# Patient Record
Sex: Female | Born: 1940
Health system: Southern US, Community
[De-identification: ages and names within clinical notes are randomized; demographics above are authoritative.]

## PROBLEM LIST (undated history)

## (undated) DIAGNOSIS — N189 Chronic kidney disease, unspecified: Secondary | ICD-10-CM

## (undated) DIAGNOSIS — I499 Cardiac arrhythmia, unspecified: Secondary | ICD-10-CM

## (undated) DIAGNOSIS — J449 Chronic obstructive pulmonary disease, unspecified: Secondary | ICD-10-CM

## (undated) DIAGNOSIS — I4891 Unspecified atrial fibrillation: Secondary | ICD-10-CM

## (undated) DIAGNOSIS — C50919 Malignant neoplasm of unspecified site of unspecified female breast: Secondary | ICD-10-CM

## (undated) DIAGNOSIS — R011 Cardiac murmur, unspecified: Secondary | ICD-10-CM

## (undated) DIAGNOSIS — I38 Endocarditis, valve unspecified: Secondary | ICD-10-CM

## (undated) DIAGNOSIS — E785 Hyperlipidemia, unspecified: Secondary | ICD-10-CM

## (undated) DIAGNOSIS — I350 Nonrheumatic aortic (valve) stenosis: Secondary | ICD-10-CM

## (undated) DIAGNOSIS — I10 Essential (primary) hypertension: Secondary | ICD-10-CM

## (undated) DIAGNOSIS — Z952 Presence of prosthetic heart valve: Secondary | ICD-10-CM

## (undated) DIAGNOSIS — I429 Cardiomyopathy, unspecified: Secondary | ICD-10-CM

## (undated) DIAGNOSIS — M109 Gout, unspecified: Secondary | ICD-10-CM

## (undated) DIAGNOSIS — I272 Pulmonary hypertension, unspecified: Secondary | ICD-10-CM

## (undated) DIAGNOSIS — I509 Heart failure, unspecified: Secondary | ICD-10-CM

## (undated) HISTORY — PX: FRACTURE SURGERY: SHX138

## (undated) HISTORY — DX: Heart failure, unspecified: I50.9

## (undated) HISTORY — DX: Chronic obstructive pulmonary disease, unspecified: J44.9

## (undated) HISTORY — PX: LEG SURGERY: SHX1003

## (undated) HISTORY — DX: Hyperlipidemia, unspecified: E78.5

---

## 1990-09-08 DIAGNOSIS — C50919 Malignant neoplasm of unspecified site of unspecified female breast: Secondary | ICD-10-CM

## 1990-09-08 HISTORY — PX: BREAST BIOPSY: SHX20

## 1990-09-08 HISTORY — DX: Malignant neoplasm of unspecified site of unspecified female breast: C50.919

## 1990-09-08 HISTORY — PX: BREAST LUMPECTOMY: SHX2

## 1994-09-08 HISTORY — PX: ABDOMINAL HYSTERECTOMY: SHX81

## 1996-09-08 HISTORY — PX: BREAST BIOPSY: SHX20

## 1998-09-08 HISTORY — PX: BREAST EXCISIONAL BIOPSY: SUR124

## 2002-09-08 LAB — HM DEXA SCAN

## 2004-07-18 ENCOUNTER — Ambulatory Visit: Payer: Self-pay | Admitting: General Surgery

## 2005-06-17 ENCOUNTER — Ambulatory Visit: Payer: Self-pay

## 2005-07-16 ENCOUNTER — Ambulatory Visit: Payer: Self-pay | Admitting: General Surgery

## 2005-09-08 HISTORY — PX: BREAST BIOPSY: SHX20

## 2006-01-20 ENCOUNTER — Encounter: Payer: Self-pay | Admitting: Family Medicine

## 2006-03-02 ENCOUNTER — Ambulatory Visit: Payer: Self-pay | Admitting: General Surgery

## 2006-09-21 ENCOUNTER — Ambulatory Visit: Payer: Self-pay | Admitting: General Surgery

## 2006-12-22 ENCOUNTER — Ambulatory Visit: Payer: Self-pay

## 2007-11-01 ENCOUNTER — Ambulatory Visit: Payer: Self-pay | Admitting: General Surgery

## 2007-12-23 ENCOUNTER — Ambulatory Visit: Payer: Self-pay | Admitting: General Surgery

## 2007-12-23 LAB — HM COLONOSCOPY

## 2008-05-08 ENCOUNTER — Ambulatory Visit: Payer: Self-pay | Admitting: Family Medicine

## 2008-11-14 ENCOUNTER — Ambulatory Visit: Payer: Self-pay | Admitting: General Surgery

## 2008-11-21 ENCOUNTER — Ambulatory Visit: Payer: Self-pay | Admitting: General Surgery

## 2009-05-10 ENCOUNTER — Ambulatory Visit: Payer: Self-pay | Admitting: Family Medicine

## 2009-05-15 ENCOUNTER — Ambulatory Visit: Payer: Self-pay | Admitting: Family Medicine

## 2009-05-29 ENCOUNTER — Ambulatory Visit: Payer: Self-pay | Admitting: Internal Medicine

## 2009-05-29 ENCOUNTER — Inpatient Hospital Stay: Payer: Self-pay | Admitting: Internal Medicine

## 2009-11-15 ENCOUNTER — Ambulatory Visit: Payer: Self-pay | Admitting: General Surgery

## 2009-12-21 ENCOUNTER — Observation Stay: Payer: Self-pay | Admitting: Student

## 2010-01-25 ENCOUNTER — Ambulatory Visit: Payer: Self-pay | Admitting: Family Medicine

## 2010-11-28 ENCOUNTER — Ambulatory Visit: Payer: Self-pay | Admitting: Family Medicine

## 2010-12-30 ENCOUNTER — Inpatient Hospital Stay: Payer: Self-pay | Admitting: Internal Medicine

## 2011-03-17 ENCOUNTER — Encounter: Payer: Self-pay | Admitting: Family Medicine

## 2011-04-09 ENCOUNTER — Encounter: Payer: Self-pay | Admitting: Family Medicine

## 2011-05-10 ENCOUNTER — Encounter: Payer: Self-pay | Admitting: Family Medicine

## 2011-05-29 ENCOUNTER — Emergency Department: Payer: Self-pay | Admitting: Emergency Medicine

## 2011-09-24 DIAGNOSIS — R7309 Other abnormal glucose: Secondary | ICD-10-CM | POA: Diagnosis not present

## 2011-09-24 DIAGNOSIS — E785 Hyperlipidemia, unspecified: Secondary | ICD-10-CM | POA: Diagnosis not present

## 2011-09-24 DIAGNOSIS — E559 Vitamin D deficiency, unspecified: Secondary | ICD-10-CM | POA: Diagnosis not present

## 2011-09-24 DIAGNOSIS — Z23 Encounter for immunization: Secondary | ICD-10-CM | POA: Diagnosis not present

## 2011-09-24 DIAGNOSIS — I10 Essential (primary) hypertension: Secondary | ICD-10-CM | POA: Diagnosis not present

## 2011-10-07 DIAGNOSIS — Z8249 Family history of ischemic heart disease and other diseases of the circulatory system: Secondary | ICD-10-CM | POA: Diagnosis not present

## 2011-10-07 DIAGNOSIS — I059 Rheumatic mitral valve disease, unspecified: Secondary | ICD-10-CM | POA: Diagnosis not present

## 2011-10-07 DIAGNOSIS — I2789 Other specified pulmonary heart diseases: Secondary | ICD-10-CM | POA: Diagnosis not present

## 2011-10-07 DIAGNOSIS — I517 Cardiomegaly: Secondary | ICD-10-CM | POA: Diagnosis not present

## 2011-10-07 DIAGNOSIS — I359 Nonrheumatic aortic valve disorder, unspecified: Secondary | ICD-10-CM | POA: Diagnosis not present

## 2011-10-07 DIAGNOSIS — I4891 Unspecified atrial fibrillation: Secondary | ICD-10-CM | POA: Diagnosis not present

## 2011-10-07 DIAGNOSIS — Z7982 Long term (current) use of aspirin: Secondary | ICD-10-CM | POA: Diagnosis not present

## 2011-10-07 DIAGNOSIS — I10 Essential (primary) hypertension: Secondary | ICD-10-CM | POA: Diagnosis not present

## 2011-10-07 DIAGNOSIS — E785 Hyperlipidemia, unspecified: Secondary | ICD-10-CM | POA: Diagnosis not present

## 2011-10-07 DIAGNOSIS — I498 Other specified cardiac arrhythmias: Secondary | ICD-10-CM | POA: Diagnosis not present

## 2011-10-07 DIAGNOSIS — Z87891 Personal history of nicotine dependence: Secondary | ICD-10-CM | POA: Diagnosis not present

## 2011-10-07 DIAGNOSIS — I447 Left bundle-branch block, unspecified: Secondary | ICD-10-CM | POA: Diagnosis not present

## 2011-10-07 DIAGNOSIS — Z79899 Other long term (current) drug therapy: Secondary | ICD-10-CM | POA: Diagnosis not present

## 2011-11-12 DIAGNOSIS — F411 Generalized anxiety disorder: Secondary | ICD-10-CM | POA: Diagnosis not present

## 2011-11-12 DIAGNOSIS — M899 Disorder of bone, unspecified: Secondary | ICD-10-CM | POA: Diagnosis not present

## 2011-11-12 DIAGNOSIS — E785 Hyperlipidemia, unspecified: Secondary | ICD-10-CM | POA: Diagnosis not present

## 2011-11-24 DIAGNOSIS — Z111 Encounter for screening for respiratory tuberculosis: Secondary | ICD-10-CM | POA: Diagnosis not present

## 2011-11-24 DIAGNOSIS — H34239 Retinal artery branch occlusion, unspecified eye: Secondary | ICD-10-CM | POA: Diagnosis not present

## 2011-11-26 ENCOUNTER — Ambulatory Visit: Payer: Self-pay | Admitting: Ophthalmology

## 2011-11-26 DIAGNOSIS — I10 Essential (primary) hypertension: Secondary | ICD-10-CM | POA: Diagnosis not present

## 2011-11-26 DIAGNOSIS — I6529 Occlusion and stenosis of unspecified carotid artery: Secondary | ICD-10-CM | POA: Diagnosis not present

## 2011-11-26 DIAGNOSIS — H349 Unspecified retinal vascular occlusion: Secondary | ICD-10-CM | POA: Diagnosis not present

## 2011-11-26 DIAGNOSIS — I359 Nonrheumatic aortic valve disorder, unspecified: Secondary | ICD-10-CM | POA: Diagnosis not present

## 2011-12-09 DIAGNOSIS — H3582 Retinal ischemia: Secondary | ICD-10-CM | POA: Diagnosis not present

## 2012-01-01 ENCOUNTER — Ambulatory Visit: Payer: Self-pay | Admitting: Family Medicine

## 2012-01-01 DIAGNOSIS — Z853 Personal history of malignant neoplasm of breast: Secondary | ICD-10-CM | POA: Diagnosis not present

## 2012-01-06 DIAGNOSIS — H34239 Retinal artery branch occlusion, unspecified eye: Secondary | ICD-10-CM | POA: Diagnosis not present

## 2012-01-07 DIAGNOSIS — I10 Essential (primary) hypertension: Secondary | ICD-10-CM | POA: Diagnosis not present

## 2012-01-07 DIAGNOSIS — H34239 Retinal artery branch occlusion, unspecified eye: Secondary | ICD-10-CM | POA: Diagnosis not present

## 2012-01-07 DIAGNOSIS — E785 Hyperlipidemia, unspecified: Secondary | ICD-10-CM | POA: Diagnosis not present

## 2012-01-07 DIAGNOSIS — F411 Generalized anxiety disorder: Secondary | ICD-10-CM | POA: Diagnosis not present

## 2012-01-08 DIAGNOSIS — H349 Unspecified retinal vascular occlusion: Secondary | ICD-10-CM | POA: Diagnosis not present

## 2012-01-12 DIAGNOSIS — E785 Hyperlipidemia, unspecified: Secondary | ICD-10-CM | POA: Diagnosis not present

## 2012-01-14 DIAGNOSIS — H349 Unspecified retinal vascular occlusion: Secondary | ICD-10-CM | POA: Diagnosis not present

## 2012-01-15 ENCOUNTER — Ambulatory Visit: Payer: Self-pay | Admitting: Neurology

## 2012-01-15 DIAGNOSIS — H53139 Sudden visual loss, unspecified eye: Secondary | ICD-10-CM | POA: Diagnosis not present

## 2012-01-15 DIAGNOSIS — R51 Headache: Secondary | ICD-10-CM | POA: Diagnosis not present

## 2012-01-20 DIAGNOSIS — I4891 Unspecified atrial fibrillation: Secondary | ICD-10-CM | POA: Diagnosis not present

## 2012-01-30 DIAGNOSIS — H2510 Age-related nuclear cataract, unspecified eye: Secondary | ICD-10-CM | POA: Diagnosis not present

## 2012-03-02 DIAGNOSIS — H34239 Retinal artery branch occlusion, unspecified eye: Secondary | ICD-10-CM | POA: Diagnosis not present

## 2012-03-09 DIAGNOSIS — H539 Unspecified visual disturbance: Secondary | ICD-10-CM | POA: Diagnosis not present

## 2012-03-09 DIAGNOSIS — I69998 Other sequelae following unspecified cerebrovascular disease: Secondary | ICD-10-CM | POA: Diagnosis not present

## 2012-04-28 DIAGNOSIS — E785 Hyperlipidemia, unspecified: Secondary | ICD-10-CM | POA: Diagnosis not present

## 2012-04-28 DIAGNOSIS — I10 Essential (primary) hypertension: Secondary | ICD-10-CM | POA: Diagnosis not present

## 2012-04-28 DIAGNOSIS — F411 Generalized anxiety disorder: Secondary | ICD-10-CM | POA: Diagnosis not present

## 2012-04-28 DIAGNOSIS — I359 Nonrheumatic aortic valve disorder, unspecified: Secondary | ICD-10-CM | POA: Diagnosis not present

## 2012-06-24 DIAGNOSIS — Z111 Encounter for screening for respiratory tuberculosis: Secondary | ICD-10-CM | POA: Diagnosis not present

## 2012-06-24 DIAGNOSIS — I509 Heart failure, unspecified: Secondary | ICD-10-CM | POA: Diagnosis not present

## 2012-06-24 DIAGNOSIS — I4891 Unspecified atrial fibrillation: Secondary | ICD-10-CM | POA: Diagnosis not present

## 2012-06-24 DIAGNOSIS — I10 Essential (primary) hypertension: Secondary | ICD-10-CM | POA: Diagnosis not present

## 2012-06-29 DIAGNOSIS — E785 Hyperlipidemia, unspecified: Secondary | ICD-10-CM | POA: Diagnosis not present

## 2012-06-29 DIAGNOSIS — I359 Nonrheumatic aortic valve disorder, unspecified: Secondary | ICD-10-CM | POA: Diagnosis not present

## 2012-06-29 DIAGNOSIS — Z0289 Encounter for other administrative examinations: Secondary | ICD-10-CM | POA: Diagnosis not present

## 2012-06-29 DIAGNOSIS — I10 Essential (primary) hypertension: Secondary | ICD-10-CM | POA: Diagnosis not present

## 2012-06-29 DIAGNOSIS — I509 Heart failure, unspecified: Secondary | ICD-10-CM | POA: Diagnosis not present

## 2012-06-29 DIAGNOSIS — I4891 Unspecified atrial fibrillation: Secondary | ICD-10-CM | POA: Diagnosis not present

## 2012-07-13 DIAGNOSIS — I359 Nonrheumatic aortic valve disorder, unspecified: Secondary | ICD-10-CM | POA: Diagnosis not present

## 2012-07-13 DIAGNOSIS — J449 Chronic obstructive pulmonary disease, unspecified: Secondary | ICD-10-CM | POA: Diagnosis not present

## 2012-07-13 DIAGNOSIS — R0602 Shortness of breath: Secondary | ICD-10-CM | POA: Diagnosis not present

## 2012-07-13 DIAGNOSIS — R7989 Other specified abnormal findings of blood chemistry: Secondary | ICD-10-CM | POA: Diagnosis not present

## 2012-07-13 DIAGNOSIS — R5381 Other malaise: Secondary | ICD-10-CM | POA: Diagnosis not present

## 2012-07-13 DIAGNOSIS — I2789 Other specified pulmonary heart diseases: Secondary | ICD-10-CM | POA: Diagnosis not present

## 2012-07-13 DIAGNOSIS — N23 Unspecified renal colic: Secondary | ICD-10-CM | POA: Diagnosis not present

## 2012-07-13 DIAGNOSIS — R5383 Other fatigue: Secondary | ICD-10-CM | POA: Diagnosis not present

## 2012-07-13 DIAGNOSIS — R7402 Elevation of levels of lactic acid dehydrogenase (LDH): Secondary | ICD-10-CM | POA: Diagnosis not present

## 2012-07-13 DIAGNOSIS — I4891 Unspecified atrial fibrillation: Secondary | ICD-10-CM | POA: Diagnosis not present

## 2012-07-13 DIAGNOSIS — I10 Essential (primary) hypertension: Secondary | ICD-10-CM | POA: Diagnosis not present

## 2012-07-13 DIAGNOSIS — I509 Heart failure, unspecified: Secondary | ICD-10-CM | POA: Diagnosis not present

## 2012-07-20 DIAGNOSIS — I10 Essential (primary) hypertension: Secondary | ICD-10-CM | POA: Diagnosis not present

## 2012-07-20 DIAGNOSIS — I4891 Unspecified atrial fibrillation: Secondary | ICD-10-CM | POA: Diagnosis not present

## 2012-08-04 DIAGNOSIS — G4700 Insomnia, unspecified: Secondary | ICD-10-CM | POA: Diagnosis not present

## 2012-08-04 DIAGNOSIS — I10 Essential (primary) hypertension: Secondary | ICD-10-CM | POA: Diagnosis not present

## 2012-08-04 DIAGNOSIS — I4891 Unspecified atrial fibrillation: Secondary | ICD-10-CM | POA: Diagnosis not present

## 2012-08-04 DIAGNOSIS — E785 Hyperlipidemia, unspecified: Secondary | ICD-10-CM | POA: Diagnosis not present

## 2012-08-17 ENCOUNTER — Ambulatory Visit: Payer: Self-pay | Admitting: Internal Medicine

## 2012-08-17 DIAGNOSIS — I517 Cardiomegaly: Secondary | ICD-10-CM | POA: Diagnosis not present

## 2012-08-17 DIAGNOSIS — I509 Heart failure, unspecified: Secondary | ICD-10-CM | POA: Diagnosis not present

## 2012-08-17 DIAGNOSIS — R0602 Shortness of breath: Secondary | ICD-10-CM | POA: Diagnosis not present

## 2012-08-17 DIAGNOSIS — R918 Other nonspecific abnormal finding of lung field: Secondary | ICD-10-CM | POA: Diagnosis not present

## 2012-08-17 DIAGNOSIS — I4891 Unspecified atrial fibrillation: Secondary | ICD-10-CM | POA: Diagnosis not present

## 2012-08-17 LAB — COMPREHENSIVE METABOLIC PANEL
Albumin: 3.9 g/dL (ref 3.4–5.0)
Alkaline Phosphatase: 61 U/L (ref 50–136)
BUN: 26 mg/dL — ABNORMAL HIGH (ref 7–18)
Bilirubin,Total: 0.7 mg/dL (ref 0.2–1.0)
Calcium, Total: 9 mg/dL (ref 8.5–10.1)
Co2: 27 mmol/L (ref 21–32)
Glucose: 96 mg/dL (ref 65–99)
Osmolality: 291 (ref 275–301)
Potassium: 3.3 mmol/L — ABNORMAL LOW (ref 3.5–5.1)
SGOT(AST): 31 U/L (ref 15–37)
Sodium: 144 mmol/L (ref 136–145)

## 2012-08-17 LAB — CBC WITH DIFFERENTIAL/PLATELET
Basophil %: 1.3 %
Eosinophil %: 2.5 %
HGB: 13.3 g/dL (ref 12.0–16.0)
Lymphocyte #: 2 10*3/uL (ref 1.0–3.6)
Lymphocyte %: 34.8 %
MCH: 28.4 pg (ref 26.0–34.0)
Monocyte #: 0.7 x10 3/mm (ref 0.2–0.9)
Monocyte %: 11.8 %
Neutrophil %: 49.6 %
Platelet: 262 10*3/uL (ref 150–440)
RBC: 4.67 10*6/uL (ref 3.80–5.20)
WBC: 5.7 10*3/uL (ref 3.6–11.0)

## 2012-08-17 LAB — TSH: Thyroid Stimulating Horm: 1.55 u[IU]/mL

## 2012-08-25 ENCOUNTER — Ambulatory Visit: Payer: Self-pay | Admitting: Internal Medicine

## 2012-08-25 DIAGNOSIS — R0602 Shortness of breath: Secondary | ICD-10-CM | POA: Diagnosis not present

## 2012-08-25 DIAGNOSIS — I359 Nonrheumatic aortic valve disorder, unspecified: Secondary | ICD-10-CM | POA: Diagnosis not present

## 2012-08-25 DIAGNOSIS — I700 Atherosclerosis of aorta: Secondary | ICD-10-CM | POA: Diagnosis not present

## 2012-08-25 DIAGNOSIS — Z853 Personal history of malignant neoplasm of breast: Secondary | ICD-10-CM | POA: Diagnosis not present

## 2012-08-25 DIAGNOSIS — Z79899 Other long term (current) drug therapy: Secondary | ICD-10-CM | POA: Diagnosis not present

## 2012-08-25 DIAGNOSIS — Z885 Allergy status to narcotic agent status: Secondary | ICD-10-CM | POA: Diagnosis not present

## 2012-08-25 DIAGNOSIS — J449 Chronic obstructive pulmonary disease, unspecified: Secondary | ICD-10-CM | POA: Diagnosis not present

## 2012-08-25 DIAGNOSIS — Z7982 Long term (current) use of aspirin: Secondary | ICD-10-CM | POA: Diagnosis not present

## 2012-08-25 DIAGNOSIS — Z87891 Personal history of nicotine dependence: Secondary | ICD-10-CM | POA: Diagnosis not present

## 2012-08-25 DIAGNOSIS — I519 Heart disease, unspecified: Secondary | ICD-10-CM | POA: Diagnosis not present

## 2012-08-25 DIAGNOSIS — I10 Essential (primary) hypertension: Secondary | ICD-10-CM | POA: Diagnosis not present

## 2012-08-25 DIAGNOSIS — I4891 Unspecified atrial fibrillation: Secondary | ICD-10-CM | POA: Diagnosis not present

## 2012-08-30 DIAGNOSIS — I4891 Unspecified atrial fibrillation: Secondary | ICD-10-CM | POA: Diagnosis not present

## 2012-08-30 DIAGNOSIS — I509 Heart failure, unspecified: Secondary | ICD-10-CM | POA: Diagnosis not present

## 2012-08-30 DIAGNOSIS — R0609 Other forms of dyspnea: Secondary | ICD-10-CM | POA: Diagnosis not present

## 2012-08-30 DIAGNOSIS — R0989 Other specified symptoms and signs involving the circulatory and respiratory systems: Secondary | ICD-10-CM | POA: Diagnosis not present

## 2012-09-06 ENCOUNTER — Ambulatory Visit: Payer: Self-pay | Admitting: Internal Medicine

## 2012-09-06 DIAGNOSIS — I150 Renovascular hypertension: Secondary | ICD-10-CM | POA: Diagnosis not present

## 2012-09-06 DIAGNOSIS — I270 Primary pulmonary hypertension: Secondary | ICD-10-CM | POA: Diagnosis not present

## 2012-09-06 DIAGNOSIS — I4891 Unspecified atrial fibrillation: Secondary | ICD-10-CM | POA: Diagnosis not present

## 2012-09-06 DIAGNOSIS — I359 Nonrheumatic aortic valve disorder, unspecified: Secondary | ICD-10-CM | POA: Diagnosis not present

## 2012-09-06 DIAGNOSIS — E669 Obesity, unspecified: Secondary | ICD-10-CM | POA: Diagnosis not present

## 2012-09-06 DIAGNOSIS — R0602 Shortness of breath: Secondary | ICD-10-CM | POA: Diagnosis not present

## 2012-09-06 DIAGNOSIS — E781 Pure hyperglyceridemia: Secondary | ICD-10-CM | POA: Diagnosis not present

## 2012-09-06 DIAGNOSIS — Q231 Congenital insufficiency of aortic valve: Secondary | ICD-10-CM | POA: Diagnosis not present

## 2012-09-06 DIAGNOSIS — I701 Atherosclerosis of renal artery: Secondary | ICD-10-CM | POA: Diagnosis not present

## 2012-09-06 DIAGNOSIS — Z87891 Personal history of nicotine dependence: Secondary | ICD-10-CM | POA: Diagnosis not present

## 2012-09-06 DIAGNOSIS — I501 Left ventricular failure: Secondary | ICD-10-CM | POA: Diagnosis not present

## 2012-09-08 HISTORY — PX: CARDIAC VALVE REPLACEMENT: SHX585

## 2012-09-15 DIAGNOSIS — I4891 Unspecified atrial fibrillation: Secondary | ICD-10-CM | POA: Diagnosis not present

## 2012-09-15 DIAGNOSIS — I359 Nonrheumatic aortic valve disorder, unspecified: Secondary | ICD-10-CM | POA: Diagnosis not present

## 2012-10-01 DIAGNOSIS — I447 Left bundle-branch block, unspecified: Secondary | ICD-10-CM | POA: Diagnosis not present

## 2012-10-01 DIAGNOSIS — Q231 Congenital insufficiency of aortic valve: Secondary | ICD-10-CM | POA: Diagnosis not present

## 2012-10-01 DIAGNOSIS — Z952 Presence of prosthetic heart valve: Secondary | ICD-10-CM | POA: Insufficient documentation

## 2012-10-01 DIAGNOSIS — I509 Heart failure, unspecified: Secondary | ICD-10-CM | POA: Diagnosis not present

## 2012-10-01 DIAGNOSIS — I359 Nonrheumatic aortic valve disorder, unspecified: Secondary | ICD-10-CM | POA: Diagnosis not present

## 2012-10-01 DIAGNOSIS — Z9889 Other specified postprocedural states: Secondary | ICD-10-CM | POA: Insufficient documentation

## 2012-10-01 DIAGNOSIS — I4891 Unspecified atrial fibrillation: Secondary | ICD-10-CM | POA: Diagnosis not present

## 2012-10-01 DIAGNOSIS — I10 Essential (primary) hypertension: Secondary | ICD-10-CM | POA: Diagnosis not present

## 2012-10-04 DIAGNOSIS — I359 Nonrheumatic aortic valve disorder, unspecified: Secondary | ICD-10-CM | POA: Diagnosis not present

## 2012-10-08 DIAGNOSIS — I359 Nonrheumatic aortic valve disorder, unspecified: Secondary | ICD-10-CM | POA: Diagnosis not present

## 2012-10-11 DIAGNOSIS — J449 Chronic obstructive pulmonary disease, unspecified: Secondary | ICD-10-CM | POA: Diagnosis not present

## 2012-10-11 DIAGNOSIS — D62 Acute posthemorrhagic anemia: Secondary | ICD-10-CM | POA: Diagnosis not present

## 2012-10-11 DIAGNOSIS — Z954 Presence of other heart-valve replacement: Secondary | ICD-10-CM | POA: Diagnosis not present

## 2012-10-11 DIAGNOSIS — I10 Essential (primary) hypertension: Secondary | ICD-10-CM | POA: Diagnosis present

## 2012-10-11 DIAGNOSIS — I2609 Other pulmonary embolism with acute cor pulmonale: Secondary | ICD-10-CM | POA: Diagnosis not present

## 2012-10-11 DIAGNOSIS — J4489 Other specified chronic obstructive pulmonary disease: Secondary | ICD-10-CM | POA: Diagnosis not present

## 2012-10-11 DIAGNOSIS — R9389 Abnormal findings on diagnostic imaging of other specified body structures: Secondary | ICD-10-CM | POA: Diagnosis not present

## 2012-10-11 DIAGNOSIS — R944 Abnormal results of kidney function studies: Secondary | ICD-10-CM | POA: Diagnosis not present

## 2012-10-11 DIAGNOSIS — I359 Nonrheumatic aortic valve disorder, unspecified: Secondary | ICD-10-CM | POA: Diagnosis not present

## 2012-10-11 DIAGNOSIS — I509 Heart failure, unspecified: Secondary | ICD-10-CM | POA: Diagnosis not present

## 2012-10-11 DIAGNOSIS — I059 Rheumatic mitral valve disease, unspecified: Secondary | ICD-10-CM | POA: Diagnosis not present

## 2012-10-11 DIAGNOSIS — Z452 Encounter for adjustment and management of vascular access device: Secondary | ICD-10-CM | POA: Diagnosis not present

## 2012-10-11 DIAGNOSIS — M109 Gout, unspecified: Secondary | ICD-10-CM | POA: Diagnosis not present

## 2012-10-11 DIAGNOSIS — I517 Cardiomegaly: Secondary | ICD-10-CM | POA: Diagnosis not present

## 2012-10-11 DIAGNOSIS — Z4682 Encounter for fitting and adjustment of non-vascular catheter: Secondary | ICD-10-CM | POA: Diagnosis not present

## 2012-10-11 DIAGNOSIS — I5022 Chronic systolic (congestive) heart failure: Secondary | ICD-10-CM | POA: Diagnosis not present

## 2012-10-11 DIAGNOSIS — J9819 Other pulmonary collapse: Secondary | ICD-10-CM | POA: Diagnosis not present

## 2012-10-11 DIAGNOSIS — I2789 Other specified pulmonary heart diseases: Secondary | ICD-10-CM | POA: Diagnosis present

## 2012-10-11 DIAGNOSIS — J811 Chronic pulmonary edema: Secondary | ICD-10-CM | POA: Diagnosis not present

## 2012-10-11 DIAGNOSIS — Z7901 Long term (current) use of anticoagulants: Secondary | ICD-10-CM | POA: Diagnosis not present

## 2012-10-11 DIAGNOSIS — I4891 Unspecified atrial fibrillation: Secondary | ICD-10-CM | POA: Diagnosis not present

## 2012-10-11 DIAGNOSIS — I079 Rheumatic tricuspid valve disease, unspecified: Secondary | ICD-10-CM | POA: Diagnosis present

## 2012-10-11 DIAGNOSIS — N179 Acute kidney failure, unspecified: Secondary | ICD-10-CM | POA: Diagnosis not present

## 2012-10-11 DIAGNOSIS — R918 Other nonspecific abnormal finding of lung field: Secondary | ICD-10-CM | POA: Diagnosis not present

## 2012-10-11 DIAGNOSIS — J9600 Acute respiratory failure, unspecified whether with hypoxia or hypercapnia: Secondary | ICD-10-CM | POA: Diagnosis not present

## 2012-11-01 DIAGNOSIS — J069 Acute upper respiratory infection, unspecified: Secondary | ICD-10-CM | POA: Diagnosis not present

## 2012-11-11 DIAGNOSIS — M109 Gout, unspecified: Secondary | ICD-10-CM | POA: Diagnosis not present

## 2012-11-15 DIAGNOSIS — I359 Nonrheumatic aortic valve disorder, unspecified: Secondary | ICD-10-CM | POA: Diagnosis not present

## 2012-11-15 DIAGNOSIS — R918 Other nonspecific abnormal finding of lung field: Secondary | ICD-10-CM | POA: Diagnosis not present

## 2012-11-15 DIAGNOSIS — I517 Cardiomegaly: Secondary | ICD-10-CM | POA: Diagnosis not present

## 2012-12-06 ENCOUNTER — Encounter: Payer: Self-pay | Admitting: Cardiothoracic Surgery

## 2012-12-06 DIAGNOSIS — Z954 Presence of other heart-valve replacement: Secondary | ICD-10-CM | POA: Diagnosis not present

## 2012-12-06 DIAGNOSIS — Z5189 Encounter for other specified aftercare: Secondary | ICD-10-CM | POA: Diagnosis not present

## 2012-12-07 ENCOUNTER — Encounter: Payer: Self-pay | Admitting: Cardiothoracic Surgery

## 2012-12-07 DIAGNOSIS — Z5189 Encounter for other specified aftercare: Secondary | ICD-10-CM | POA: Diagnosis not present

## 2012-12-07 DIAGNOSIS — Z954 Presence of other heart-valve replacement: Secondary | ICD-10-CM | POA: Diagnosis not present

## 2012-12-17 DIAGNOSIS — I4891 Unspecified atrial fibrillation: Secondary | ICD-10-CM | POA: Diagnosis not present

## 2012-12-17 DIAGNOSIS — Z9889 Other specified postprocedural states: Secondary | ICD-10-CM | POA: Diagnosis not present

## 2012-12-17 DIAGNOSIS — Q231 Congenital insufficiency of aortic valve: Secondary | ICD-10-CM | POA: Diagnosis not present

## 2012-12-24 DIAGNOSIS — I4891 Unspecified atrial fibrillation: Secondary | ICD-10-CM | POA: Diagnosis not present

## 2012-12-29 DIAGNOSIS — I447 Left bundle-branch block, unspecified: Secondary | ICD-10-CM | POA: Diagnosis not present

## 2012-12-29 DIAGNOSIS — R9431 Abnormal electrocardiogram [ECG] [EKG]: Secondary | ICD-10-CM | POA: Diagnosis not present

## 2012-12-29 DIAGNOSIS — I498 Other specified cardiac arrhythmias: Secondary | ICD-10-CM | POA: Diagnosis not present

## 2012-12-29 DIAGNOSIS — I4891 Unspecified atrial fibrillation: Secondary | ICD-10-CM | POA: Diagnosis not present

## 2012-12-29 DIAGNOSIS — Z954 Presence of other heart-valve replacement: Secondary | ICD-10-CM | POA: Diagnosis not present

## 2013-01-04 DIAGNOSIS — Z954 Presence of other heart-valve replacement: Secondary | ICD-10-CM | POA: Diagnosis not present

## 2013-01-04 DIAGNOSIS — I4891 Unspecified atrial fibrillation: Secondary | ICD-10-CM | POA: Diagnosis not present

## 2013-01-04 DIAGNOSIS — Z9889 Other specified postprocedural states: Secondary | ICD-10-CM | POA: Diagnosis not present

## 2013-01-06 ENCOUNTER — Encounter: Payer: Self-pay | Admitting: Cardiothoracic Surgery

## 2013-01-06 DIAGNOSIS — Z5189 Encounter for other specified aftercare: Secondary | ICD-10-CM | POA: Diagnosis not present

## 2013-01-06 DIAGNOSIS — Z954 Presence of other heart-valve replacement: Secondary | ICD-10-CM | POA: Diagnosis not present

## 2013-01-11 DIAGNOSIS — Z9889 Other specified postprocedural states: Secondary | ICD-10-CM | POA: Diagnosis not present

## 2013-01-11 DIAGNOSIS — Z954 Presence of other heart-valve replacement: Secondary | ICD-10-CM | POA: Diagnosis not present

## 2013-01-18 DIAGNOSIS — Z954 Presence of other heart-valve replacement: Secondary | ICD-10-CM | POA: Diagnosis not present

## 2013-01-18 DIAGNOSIS — Z9889 Other specified postprocedural states: Secondary | ICD-10-CM | POA: Diagnosis not present

## 2013-02-01 DIAGNOSIS — Z954 Presence of other heart-valve replacement: Secondary | ICD-10-CM | POA: Diagnosis not present

## 2013-02-01 DIAGNOSIS — Z9889 Other specified postprocedural states: Secondary | ICD-10-CM | POA: Diagnosis not present

## 2013-02-06 ENCOUNTER — Encounter: Payer: Self-pay | Admitting: Cardiothoracic Surgery

## 2013-02-22 DIAGNOSIS — Z954 Presence of other heart-valve replacement: Secondary | ICD-10-CM | POA: Diagnosis not present

## 2013-02-22 DIAGNOSIS — I359 Nonrheumatic aortic valve disorder, unspecified: Secondary | ICD-10-CM | POA: Diagnosis not present

## 2013-02-22 DIAGNOSIS — Z9889 Other specified postprocedural states: Secondary | ICD-10-CM | POA: Diagnosis not present

## 2013-02-22 DIAGNOSIS — I5022 Chronic systolic (congestive) heart failure: Secondary | ICD-10-CM | POA: Diagnosis not present

## 2013-03-01 ENCOUNTER — Ambulatory Visit: Payer: Self-pay | Admitting: Family Medicine

## 2013-03-01 DIAGNOSIS — R928 Other abnormal and inconclusive findings on diagnostic imaging of breast: Secondary | ICD-10-CM | POA: Diagnosis not present

## 2013-03-01 DIAGNOSIS — R920 Mammographic microcalcification found on diagnostic imaging of breast: Secondary | ICD-10-CM | POA: Diagnosis not present

## 2013-03-01 DIAGNOSIS — Z853 Personal history of malignant neoplasm of breast: Secondary | ICD-10-CM | POA: Diagnosis not present

## 2013-03-08 ENCOUNTER — Encounter: Payer: Self-pay | Admitting: Cardiothoracic Surgery

## 2013-03-08 DIAGNOSIS — Z9889 Other specified postprocedural states: Secondary | ICD-10-CM | POA: Diagnosis not present

## 2013-03-08 DIAGNOSIS — Z954 Presence of other heart-valve replacement: Secondary | ICD-10-CM | POA: Diagnosis not present

## 2013-03-18 DIAGNOSIS — H35359 Cystoid macular degeneration, unspecified eye: Secondary | ICD-10-CM | POA: Diagnosis not present

## 2013-04-05 DIAGNOSIS — Z954 Presence of other heart-valve replacement: Secondary | ICD-10-CM | POA: Diagnosis not present

## 2013-04-05 DIAGNOSIS — Z9889 Other specified postprocedural states: Secondary | ICD-10-CM | POA: Diagnosis not present

## 2013-04-08 ENCOUNTER — Encounter: Payer: Self-pay | Admitting: Cardiothoracic Surgery

## 2013-05-10 DIAGNOSIS — Z7901 Long term (current) use of anticoagulants: Secondary | ICD-10-CM | POA: Diagnosis not present

## 2013-05-10 DIAGNOSIS — Z9889 Other specified postprocedural states: Secondary | ICD-10-CM | POA: Diagnosis not present

## 2013-05-10 DIAGNOSIS — Z954 Presence of other heart-valve replacement: Secondary | ICD-10-CM | POA: Diagnosis not present

## 2013-05-31 DIAGNOSIS — Z9889 Other specified postprocedural states: Secondary | ICD-10-CM | POA: Diagnosis not present

## 2013-05-31 DIAGNOSIS — J029 Acute pharyngitis, unspecified: Secondary | ICD-10-CM | POA: Diagnosis not present

## 2013-05-31 DIAGNOSIS — Z954 Presence of other heart-valve replacement: Secondary | ICD-10-CM | POA: Diagnosis not present

## 2013-05-31 DIAGNOSIS — I4891 Unspecified atrial fibrillation: Secondary | ICD-10-CM | POA: Diagnosis not present

## 2013-05-31 DIAGNOSIS — Z23 Encounter for immunization: Secondary | ICD-10-CM | POA: Diagnosis not present

## 2013-06-15 DIAGNOSIS — I4891 Unspecified atrial fibrillation: Secondary | ICD-10-CM | POA: Diagnosis not present

## 2013-06-15 DIAGNOSIS — Z7901 Long term (current) use of anticoagulants: Secondary | ICD-10-CM | POA: Diagnosis not present

## 2013-06-15 DIAGNOSIS — Z9889 Other specified postprocedural states: Secondary | ICD-10-CM | POA: Diagnosis not present

## 2013-06-15 DIAGNOSIS — Z954 Presence of other heart-valve replacement: Secondary | ICD-10-CM | POA: Diagnosis not present

## 2013-06-15 DIAGNOSIS — Q231 Congenital insufficiency of aortic valve: Secondary | ICD-10-CM | POA: Diagnosis not present

## 2013-06-29 DIAGNOSIS — Z954 Presence of other heart-valve replacement: Secondary | ICD-10-CM | POA: Diagnosis not present

## 2013-08-09 DIAGNOSIS — I4891 Unspecified atrial fibrillation: Secondary | ICD-10-CM | POA: Diagnosis not present

## 2013-08-10 DIAGNOSIS — I509 Heart failure, unspecified: Secondary | ICD-10-CM | POA: Diagnosis not present

## 2013-08-10 DIAGNOSIS — I10 Essential (primary) hypertension: Secondary | ICD-10-CM | POA: Diagnosis not present

## 2013-08-10 DIAGNOSIS — Z9181 History of falling: Secondary | ICD-10-CM | POA: Diagnosis not present

## 2013-08-10 DIAGNOSIS — E785 Hyperlipidemia, unspecified: Secondary | ICD-10-CM | POA: Diagnosis not present

## 2013-08-15 DIAGNOSIS — R7309 Other abnormal glucose: Secondary | ICD-10-CM | POA: Diagnosis not present

## 2013-08-15 DIAGNOSIS — R635 Abnormal weight gain: Secondary | ICD-10-CM | POA: Diagnosis not present

## 2013-08-15 DIAGNOSIS — I10 Essential (primary) hypertension: Secondary | ICD-10-CM | POA: Diagnosis not present

## 2013-08-15 DIAGNOSIS — E785 Hyperlipidemia, unspecified: Secondary | ICD-10-CM | POA: Diagnosis not present

## 2013-09-06 DIAGNOSIS — I4891 Unspecified atrial fibrillation: Secondary | ICD-10-CM | POA: Diagnosis not present

## 2013-09-06 DIAGNOSIS — I359 Nonrheumatic aortic valve disorder, unspecified: Secondary | ICD-10-CM | POA: Diagnosis not present

## 2013-09-09 DIAGNOSIS — I4891 Unspecified atrial fibrillation: Secondary | ICD-10-CM | POA: Diagnosis not present

## 2013-09-22 DIAGNOSIS — H25019 Cortical age-related cataract, unspecified eye: Secondary | ICD-10-CM | POA: Diagnosis not present

## 2013-10-05 DIAGNOSIS — I4891 Unspecified atrial fibrillation: Secondary | ICD-10-CM | POA: Diagnosis not present

## 2013-10-18 DIAGNOSIS — J329 Chronic sinusitis, unspecified: Secondary | ICD-10-CM | POA: Diagnosis not present

## 2013-10-19 DIAGNOSIS — Z954 Presence of other heart-valve replacement: Secondary | ICD-10-CM | POA: Diagnosis not present

## 2013-11-02 DIAGNOSIS — Z954 Presence of other heart-valve replacement: Secondary | ICD-10-CM | POA: Diagnosis not present

## 2013-11-16 DIAGNOSIS — Z954 Presence of other heart-valve replacement: Secondary | ICD-10-CM | POA: Diagnosis not present

## 2013-12-28 DIAGNOSIS — R252 Cramp and spasm: Secondary | ICD-10-CM | POA: Diagnosis not present

## 2013-12-28 DIAGNOSIS — R7309 Other abnormal glucose: Secondary | ICD-10-CM | POA: Diagnosis not present

## 2014-01-23 DIAGNOSIS — R7309 Other abnormal glucose: Secondary | ICD-10-CM | POA: Diagnosis not present

## 2014-01-23 DIAGNOSIS — M109 Gout, unspecified: Secondary | ICD-10-CM | POA: Diagnosis not present

## 2014-01-23 DIAGNOSIS — I10 Essential (primary) hypertension: Secondary | ICD-10-CM | POA: Diagnosis not present

## 2014-01-23 DIAGNOSIS — J441 Chronic obstructive pulmonary disease with (acute) exacerbation: Secondary | ICD-10-CM | POA: Diagnosis not present

## 2014-01-25 DIAGNOSIS — M109 Gout, unspecified: Secondary | ICD-10-CM | POA: Diagnosis not present

## 2014-01-25 DIAGNOSIS — R7309 Other abnormal glucose: Secondary | ICD-10-CM | POA: Diagnosis not present

## 2014-01-25 DIAGNOSIS — E785 Hyperlipidemia, unspecified: Secondary | ICD-10-CM | POA: Diagnosis not present

## 2014-01-25 DIAGNOSIS — I10 Essential (primary) hypertension: Secondary | ICD-10-CM | POA: Diagnosis not present

## 2014-01-25 LAB — LIPID PANEL
Cholesterol: 176 mg/dL (ref 0–200)
HDL: 46 mg/dL (ref 35–70)
LDL CALC: 95 mg/dL
Triglycerides: 175 mg/dL — AB (ref 40–160)

## 2014-01-25 LAB — HEMOGLOBIN A1C: Hgb A1c MFr Bld: 5.9 % (ref 4.0–6.0)

## 2014-01-27 DIAGNOSIS — I4891 Unspecified atrial fibrillation: Secondary | ICD-10-CM | POA: Diagnosis not present

## 2014-02-27 DIAGNOSIS — I4891 Unspecified atrial fibrillation: Secondary | ICD-10-CM | POA: Diagnosis not present

## 2014-03-15 DIAGNOSIS — I4891 Unspecified atrial fibrillation: Secondary | ICD-10-CM | POA: Diagnosis not present

## 2014-03-17 DIAGNOSIS — I10 Essential (primary) hypertension: Secondary | ICD-10-CM | POA: Diagnosis not present

## 2014-03-17 DIAGNOSIS — Z954 Presence of other heart-valve replacement: Secondary | ICD-10-CM | POA: Diagnosis not present

## 2014-03-17 DIAGNOSIS — R0609 Other forms of dyspnea: Secondary | ICD-10-CM | POA: Diagnosis not present

## 2014-03-17 DIAGNOSIS — R0989 Other specified symptoms and signs involving the circulatory and respiratory systems: Secondary | ICD-10-CM | POA: Diagnosis not present

## 2014-03-17 DIAGNOSIS — I4891 Unspecified atrial fibrillation: Secondary | ICD-10-CM | POA: Diagnosis not present

## 2014-03-28 DIAGNOSIS — I4891 Unspecified atrial fibrillation: Secondary | ICD-10-CM | POA: Diagnosis not present

## 2014-04-13 DIAGNOSIS — I4891 Unspecified atrial fibrillation: Secondary | ICD-10-CM | POA: Diagnosis not present

## 2014-05-08 ENCOUNTER — Ambulatory Visit: Payer: Self-pay | Admitting: Family Medicine

## 2014-05-08 DIAGNOSIS — Z853 Personal history of malignant neoplasm of breast: Secondary | ICD-10-CM | POA: Diagnosis not present

## 2014-05-08 DIAGNOSIS — Z1231 Encounter for screening mammogram for malignant neoplasm of breast: Secondary | ICD-10-CM | POA: Diagnosis not present

## 2014-05-08 LAB — HM MAMMOGRAPHY: HM MAMMO: NORMAL

## 2014-05-09 DIAGNOSIS — I4891 Unspecified atrial fibrillation: Secondary | ICD-10-CM | POA: Diagnosis not present

## 2014-05-30 DIAGNOSIS — G4700 Insomnia, unspecified: Secondary | ICD-10-CM | POA: Diagnosis not present

## 2014-05-30 DIAGNOSIS — E785 Hyperlipidemia, unspecified: Secondary | ICD-10-CM | POA: Diagnosis not present

## 2014-05-30 DIAGNOSIS — M109 Gout, unspecified: Secondary | ICD-10-CM | POA: Diagnosis not present

## 2014-05-30 DIAGNOSIS — Z23 Encounter for immunization: Secondary | ICD-10-CM | POA: Diagnosis not present

## 2014-05-30 DIAGNOSIS — I10 Essential (primary) hypertension: Secondary | ICD-10-CM | POA: Diagnosis not present

## 2014-05-30 DIAGNOSIS — Z0000 Encounter for general adult medical examination without abnormal findings: Secondary | ICD-10-CM | POA: Diagnosis not present

## 2014-05-30 DIAGNOSIS — R490 Dysphonia: Secondary | ICD-10-CM | POA: Diagnosis not present

## 2014-05-30 DIAGNOSIS — Z9181 History of falling: Secondary | ICD-10-CM | POA: Diagnosis not present

## 2014-05-30 DIAGNOSIS — Z1331 Encounter for screening for depression: Secondary | ICD-10-CM | POA: Diagnosis not present

## 2014-06-14 DIAGNOSIS — I4891 Unspecified atrial fibrillation: Secondary | ICD-10-CM | POA: Diagnosis not present

## 2014-07-28 DIAGNOSIS — I4891 Unspecified atrial fibrillation: Secondary | ICD-10-CM | POA: Diagnosis not present

## 2014-08-15 DIAGNOSIS — I4891 Unspecified atrial fibrillation: Secondary | ICD-10-CM | POA: Diagnosis not present

## 2014-09-06 DIAGNOSIS — J069 Acute upper respiratory infection, unspecified: Secondary | ICD-10-CM | POA: Diagnosis not present

## 2014-09-06 DIAGNOSIS — K5900 Constipation, unspecified: Secondary | ICD-10-CM | POA: Diagnosis not present

## 2014-10-10 DIAGNOSIS — Z23 Encounter for immunization: Secondary | ICD-10-CM | POA: Diagnosis not present

## 2014-10-10 DIAGNOSIS — I5020 Unspecified systolic (congestive) heart failure: Secondary | ICD-10-CM | POA: Diagnosis not present

## 2014-10-10 DIAGNOSIS — Z713 Dietary counseling and surveillance: Secondary | ICD-10-CM | POA: Diagnosis not present

## 2014-10-10 DIAGNOSIS — I10 Essential (primary) hypertension: Secondary | ICD-10-CM | POA: Diagnosis not present

## 2014-10-10 DIAGNOSIS — E8881 Metabolic syndrome: Secondary | ICD-10-CM | POA: Diagnosis not present

## 2014-10-10 DIAGNOSIS — K5900 Constipation, unspecified: Secondary | ICD-10-CM | POA: Diagnosis not present

## 2014-10-10 DIAGNOSIS — M109 Gout, unspecified: Secondary | ICD-10-CM | POA: Diagnosis not present

## 2014-10-10 DIAGNOSIS — E669 Obesity, unspecified: Secondary | ICD-10-CM | POA: Diagnosis not present

## 2014-10-10 DIAGNOSIS — E785 Hyperlipidemia, unspecified: Secondary | ICD-10-CM | POA: Diagnosis not present

## 2014-10-10 DIAGNOSIS — I272 Other secondary pulmonary hypertension: Secondary | ICD-10-CM | POA: Diagnosis not present

## 2014-10-10 DIAGNOSIS — J449 Chronic obstructive pulmonary disease, unspecified: Secondary | ICD-10-CM | POA: Diagnosis not present

## 2014-10-10 DIAGNOSIS — E2839 Other primary ovarian failure: Secondary | ICD-10-CM | POA: Diagnosis not present

## 2014-10-12 DIAGNOSIS — I4891 Unspecified atrial fibrillation: Secondary | ICD-10-CM | POA: Diagnosis not present

## 2014-10-18 DIAGNOSIS — J441 Chronic obstructive pulmonary disease with (acute) exacerbation: Secondary | ICD-10-CM | POA: Diagnosis not present

## 2014-10-26 DIAGNOSIS — I4891 Unspecified atrial fibrillation: Secondary | ICD-10-CM | POA: Diagnosis not present

## 2014-11-16 DIAGNOSIS — I4891 Unspecified atrial fibrillation: Secondary | ICD-10-CM | POA: Diagnosis not present

## 2014-12-13 DIAGNOSIS — M3501 Sicca syndrome with keratoconjunctivitis: Secondary | ICD-10-CM | POA: Diagnosis not present

## 2014-12-25 DIAGNOSIS — J302 Other seasonal allergic rhinitis: Secondary | ICD-10-CM | POA: Diagnosis not present

## 2014-12-25 DIAGNOSIS — J069 Acute upper respiratory infection, unspecified: Secondary | ICD-10-CM | POA: Diagnosis not present

## 2014-12-25 DIAGNOSIS — J441 Chronic obstructive pulmonary disease with (acute) exacerbation: Secondary | ICD-10-CM | POA: Diagnosis not present

## 2014-12-25 DIAGNOSIS — I4891 Unspecified atrial fibrillation: Secondary | ICD-10-CM | POA: Diagnosis not present

## 2014-12-29 NOTE — H&P (Signed)
PATIENT NAME:  [name], [name] MR#:  [id number] DATE OF BIRTH:  05-17-1941  DATE OF ADMISSION:  09/06/2012  INDICATION:  1. Aortic stenosis. 2. Aortic insufficiency. 3. Congestive heart failure.  4. Atrial fibrillation.  5. Cardiomyopathy.  6. Pulmonary hypertension.   REFERRING PHYSICIAN: [name], MD  HISTORY OF PRESENT ILLNESS: [name] is a 74 year old black female with a known history of bicuspid aortic valve and has been followed for years and has been doing reasonably well. She has a history of recent atrial fibrillation, shortness of breath, fatigue, congestive heart failure, asthma, dyspnea on exertion, AS and AI, and recently found to have pulmonary hypertension. She has had a recently benign course but got progressively worse in the last 2 to 3 months where she has had more dyspnea, fatigue and no energy. She was found to be in atrial fibrillation. We subsequently cardioverted her about two weeks ago and she has done reasonably well. She has had some improvement in symptoms, but she still feels tired and fatigued. Echocardiogram at that time showed worsening LV function and moderately severe depressed LV function of about 35 with severe aortic stenosis and moderate aortic insufficiency. She was also found to have significant pulmonary hypertension, which is new. She has complained of some chest pain symptoms as well, so cardiac catheterization was recommended for further evaluation of her heart failure, aortic stenosis, atrial fibrillation and pulmonary hypertension. The patient had malignant hypertension as well with blood pressures of about 200 at times. She has been controlled on Xarelto for anticoagulation, but that has been held pre-cath.   REVIEW OF SYSTEMS: She denies blackout spells or syncope. She has not had any nausea or vomiting. She denies any fever. No chills. No sweats. She has had some fatigue, weakness, tiredness, shortness of breath, dyspnea, PND, orthopnea,  minimal leg swelling and just mild occasional chest pain.   PAST MEDICAL HISTORY: 1. Shortness of breath.  2. Heart failure. 3. Hypertension. 4. Congestive heart failure.  5. Hyperlipidemia.  6. Aortic stenosis. 7. Bicuspid aortic valve.  8. Pneumonia. 9. Paroxysmal atrial fibrillation. 10. Obesity. 11. History of stroke in the past. 12. History of smoking. 13. Hypertriglyceridemia.   PAST SURGICAL HISTORY: Hysterectomy.   ALLERGIES: None.   FAMILY HISTORY: Heart failure, hypertension and stroke.   SOCIAL HISTORY: Occasional smoker but recently gave it up. Retired.   PHYSICAL EXAMINATION:  VITAL SIGNS: The patient is hypertensive with blood pressure of 190/100, pulse 55, respiratory rate of 16 and saturations of 94% on room air.   HEENT: Normocephalic, atraumatic. Pupils equal and reactive to light.   NECK: Supple. No significant jugular venous distention, bruits or adenopathy.   PULMONARY: Lung exam was clear to auscultation and percussion. No significant wheezing, rhonchi or rales.   CARDIOVASCULAR: Regular rate and rhythm. She is a little bradycardic. Positive S3. Soft S4. PMI slightly displaced laterally. Harsh 3/6 systolic ejection murmur left sternal border. Positive 2/6 diastolic murmur.   ABDOMEN: Benign with positive bowel sounds. No rebound, guarding or tenderness.   EXTREMITIES: Within normal limits. No significant cyanosis, clubbing or edema.   NEUROLOGIC: Grossly intact.   SKIN: Normal.   RESULTS: EKG: Sinus brady, interventricular conduction delay, borderline left bundle branchy block, sinus rhythm and diffuse nonspecific ST-T changes.   Previous echo suggests depressed LV function of about 30 to 35% with severe aortic stenosis. Valve area under 0.9 sq/cm. Moderate aortic insufficiency and moderate to severe pulmonary hypertension.   ASSESSMENT: 1. Congestive heart failure. 2. Aortic  stenosis. 3. Aortic insufficiency. 4. Congestive heart  failure. 5. Shortness of breath. 6. Atrial fibrillation. 7. Asthma. 8. Murmur. 9. Hypercholesterolemia.  10. Pulmonary hypertension. 11. Mild obesity. 12. Past history of smoking.  13. Asthma.   PLAN: I recommend further evaluation. Indication for cardiac catheterization with aortic stenosis or aortic insufficiency, congestive heart failure and pulmonary hypertension. Hold Xarelto prior to the procedure for 48 hours. Continue other current medications. Continue          diuretics. We will try to evaluate right and left heart catheterization for evaluation of pulmonary hypertension and her need for aortic valve replacement at this point. ____________________________ Loran Senters. Clayborn Bigness, MD ddc:sb D: 09/07/2012 13:03:08 ET T: 09/07/2012 13:16:00 ET JOB#: [id number]  cc: Dwayne D. Clayborn Bigness, MD, <Dictator> Yolonda Kida MD ELECTRONICALLY SIGNED 09/23/2012 7:05

## 2015-02-09 ENCOUNTER — Telehealth: Payer: Self-pay

## 2015-02-09 ENCOUNTER — Ambulatory Visit (INDEPENDENT_AMBULATORY_CARE_PROVIDER_SITE_OTHER): Payer: Medicare Other | Admitting: Family Medicine

## 2015-02-09 ENCOUNTER — Encounter: Payer: Self-pay | Admitting: Family Medicine

## 2015-02-09 VITALS — BP 120/72 | HR 80 | Temp 98.0°F | Resp 16 | Ht 62.5 in | Wt 184.7 lb

## 2015-02-09 DIAGNOSIS — H34239 Retinal artery branch occlusion, unspecified eye: Secondary | ICD-10-CM | POA: Insufficient documentation

## 2015-02-09 DIAGNOSIS — R002 Palpitations: Secondary | ICD-10-CM | POA: Diagnosis not present

## 2015-02-09 DIAGNOSIS — I5042 Chronic combined systolic (congestive) and diastolic (congestive) heart failure: Secondary | ICD-10-CM | POA: Insufficient documentation

## 2015-02-09 DIAGNOSIS — I5022 Chronic systolic (congestive) heart failure: Secondary | ICD-10-CM | POA: Insufficient documentation

## 2015-02-09 DIAGNOSIS — I4891 Unspecified atrial fibrillation: Secondary | ICD-10-CM | POA: Insufficient documentation

## 2015-02-09 DIAGNOSIS — I10 Essential (primary) hypertension: Secondary | ICD-10-CM | POA: Insufficient documentation

## 2015-02-09 DIAGNOSIS — H9190 Unspecified hearing loss, unspecified ear: Secondary | ICD-10-CM | POA: Insufficient documentation

## 2015-02-09 DIAGNOSIS — I480 Paroxysmal atrial fibrillation: Secondary | ICD-10-CM | POA: Diagnosis not present

## 2015-02-09 DIAGNOSIS — G43109 Migraine with aura, not intractable, without status migrainosus: Secondary | ICD-10-CM | POA: Insufficient documentation

## 2015-02-09 DIAGNOSIS — M109 Gout, unspecified: Secondary | ICD-10-CM | POA: Insufficient documentation

## 2015-02-09 DIAGNOSIS — IMO0002 Reserved for concepts with insufficient information to code with codable children: Secondary | ICD-10-CM | POA: Insufficient documentation

## 2015-02-09 DIAGNOSIS — J302 Other seasonal allergic rhinitis: Secondary | ICD-10-CM | POA: Insufficient documentation

## 2015-02-09 DIAGNOSIS — I272 Pulmonary hypertension, unspecified: Secondary | ICD-10-CM | POA: Insufficient documentation

## 2015-02-09 DIAGNOSIS — M7120 Synovial cyst of popliteal space [Baker], unspecified knee: Secondary | ICD-10-CM | POA: Insufficient documentation

## 2015-02-09 DIAGNOSIS — Z9114 Patient's other noncompliance with medication regimen: Secondary | ICD-10-CM

## 2015-02-09 DIAGNOSIS — Z6710 Type A blood, Rh positive: Secondary | ICD-10-CM | POA: Insufficient documentation

## 2015-02-09 DIAGNOSIS — Z9889 Other specified postprocedural states: Secondary | ICD-10-CM | POA: Insufficient documentation

## 2015-02-09 DIAGNOSIS — E8881 Metabolic syndrome: Secondary | ICD-10-CM | POA: Insufficient documentation

## 2015-02-09 DIAGNOSIS — I517 Cardiomegaly: Secondary | ICD-10-CM | POA: Insufficient documentation

## 2015-02-09 DIAGNOSIS — E785 Hyperlipidemia, unspecified: Secondary | ICD-10-CM | POA: Insufficient documentation

## 2015-02-09 DIAGNOSIS — R490 Dysphonia: Secondary | ICD-10-CM | POA: Insufficient documentation

## 2015-02-09 DIAGNOSIS — K5909 Other constipation: Secondary | ICD-10-CM | POA: Insufficient documentation

## 2015-02-09 DIAGNOSIS — R945 Abnormal results of liver function studies: Secondary | ICD-10-CM | POA: Insufficient documentation

## 2015-02-09 DIAGNOSIS — F419 Anxiety disorder, unspecified: Secondary | ICD-10-CM | POA: Insufficient documentation

## 2015-02-09 DIAGNOSIS — G4700 Insomnia, unspecified: Secondary | ICD-10-CM | POA: Insufficient documentation

## 2015-02-09 DIAGNOSIS — E559 Vitamin D deficiency, unspecified: Secondary | ICD-10-CM | POA: Insufficient documentation

## 2015-02-09 DIAGNOSIS — N289 Disorder of kidney and ureter, unspecified: Secondary | ICD-10-CM | POA: Insufficient documentation

## 2015-02-09 NOTE — Telephone Encounter (Signed)
Chart opened for work note

## 2015-02-09 NOTE — Patient Instructions (Signed)
Basics of Medication Management  UNDERSTAND YOUR MEDICATIONS   Read all of the labels and inserts that come with your medications. Review the information on this form often.   Know what potential side effects to look for (for each medication).   Know what each of your medications look like (by color, shape, size, stamp). If you are getting confused and having a hard time telling them apart, talk with your caregiver or pharmacist. They may be able to change the medication or help you to identify them more easily.   Check with your pharmacist if you notice a difference in the size or color of your medication.   Get all of your medications at one pharmacy. The pharmacist will have all of your information and understand possible drug interactions.   Ask your caregiver questions about your prescriptions and any over-the-counter medications, vitamins, herbal or dietary supplements that you take.  TAKE YOUR MEDICATION SAFELY   Take medications only as prescribed.   Talk with your caregiver or pharmacist if some of your pills look the same and it is difficult to tell them apart. They can help you to recognize different medications.   Never double up on your medication.   Never take anyone else's medication or share your medications.   Do not stop taking your medication(s) unless you have discussed it with your caregiver.   Do not split, mash, or chew medications unless your caregiver tells you to do so. Tell your caregiver if you have trouble swallowing your medication(s).   For liquid medications, make sure you use the dosing container provided to you.   You may need to avoid alcohol or certain foods or liquids with one or more of your medications. Make sure you remember how to take each medication with some of the tools below.  ORGANIZE YOUR MEDICATIONS   Use a tool, such as a weekly pill box (available at your local pharmacy), written chart from your caregiver, notebook, binder, or your own calendar to  organize your daily medications. Please note: if you are having trouble telling your different medications apart, keep them in the original bottles.   Set cues or reminders for taking your medications. Use watch alarms, mobile device/phone calendar alarms, or sticky notes.   More advanced medication management systems are also available. These offer weekly or monthly options complete with storage, alarms, and visual and audio prompts.   Your system should help you to keep track of the:   Name of medication and dose.   Day.   Time.   Pill to take (by color, shape, size, or name/imprint).   How to take it (with or without certain foods, on an empty stomach, with fluids etc.).   Review your medication schedule with a family member or friend to help you. Other household members should understand your medications.   If you are taking medications on an "as needed" basis such as those for nausea or pain, write down the name, dose, and time you took the medication so that you remember what you have taken.  PLAN AHEAD FOR REFILLS AND TRAVEL   Take your pill box, medications, and calendar system with you when you travel.   Plan ahead for refills as to not run out of your medication(s).   Always carry an updated list of your medications with you. If there is an emergency, a respondent can quickly see what medications you are taking.  STORE AND DISCARD YOUR MEDICATIONS SAFELY   Store medications in   a cool, dry area away from light. (The bathroom is not a good place for storage because of heat and humidity.)   Store your medications away from chemicals, pet medications, or other family member's medications.   Keep medications out of children's reach, away from counters and bedside tables. Store them up high in cabinets or shelves.   Check expiration dates regularly.   Learn about the best way to dispose of each medication you take. Find out if your local government recycling program has a Medicine Take Back  program for safe disposal. If not, some medications may be mixed with inedible substances and thrown away in the trash. Certain medications are to be flushed down the toilet.  REMEMBER:   Tell your caregiver if you experience side effects, new symptoms, or have other concerns. There may be dosing changes or alternative medications that would be better for you.   Review your medications regularly with your caregiver. Check to see if you need to continue to take each medication, and discuss how well they are working. Medicines, diet, medical conditions, weight changes, and other habits can all affect how medicines work.  PEDIATRIC CONSIDERATIONS  If you are taking care of an infant or child who needs multiple medications, follow the tips above to organize a medication schedule and safely give and store medications.    Use positive reinforcement (singing, cuddling, reward) for your child to help him or her take necessary medications.   Use only syringes, droppers, dosing spoons, or dosing cups provided by your caregiver or pharmacist.   Always wash your hands before giving medications.   Get to know your child's school medication policies. Meet with the school nurse to review the medication schedule in detail. Never send the medication to school with your child.   Check with your child's caregiver if he or she has trouble taking medication, forgets a dose, or spits it up.   Make sure your child knows how to use an inhaler properly if needed.   Do not give your child over-the-counter cough and cold medicines if they are under 2 years of age.   Avoid giving your child or teenager Aspirin or Aspirin-containing products.  Document Released: 12/10/2010 Document Revised: 11/17/2011 Document Reviewed: 12/10/2010  Exit[employer] Patient Information 2015 [employer]. This information is not intended to replace advice given to you by your health care provider. Make sure you discuss any questions you have with your  health care provider.

## 2015-02-09 NOTE — Progress Notes (Signed)
Name: [name]   MRN: [id number]    DOB: 05/18/1941   Date:02/09/2015       Progress Note  Subjective  Chief Complaint  Chief Complaint  Patient presents with  . Palpitations    started this am @ work, but forgot to take medication Metotoprolol    HPI  Palpitation: She occasionally skips taking metoprolol, however she skipped the dose last night and this am, when she was at work this morning, she felt palpitation , beats felt irregular, she denies any dizziness , chest pain or SOB, but she felt drained, clammy , but bp at work was 160/80.  She took a friends metoprolol ( same as her dose) and within one hour she started to feel well. She states she is back to normal now and wants to go back to work.     History  Substance Use Topics  . Smoking status: Former Smoker    Quit date: 09/09/2007  . Smokeless tobacco: Never Used  . Alcohol Use: No     Current outpatient prescriptions:  .  allopurinol (ZYLOPRIM) 300 MG tablet, Take 300 mg by mouth daily., Disp: , Rfl: 3 .  aspirin EC 81 MG tablet, Take 1 tablet by mouth daily., Disp: , Rfl:  .  atorvastatin (LIPITOR) 80 MG tablet, Take 80 mg by mouth daily., Disp: , Rfl: 3 .  colchicine 0.6 MG tablet, 1 tablet as needed., Disp: , Rfl:  .  diazepam (VALIUM) 5 MG tablet, Take 1 tablet by mouth as needed., Disp: , Rfl:  .  fluticasone (FLONASE) 50 MCG/ACT nasal spray, , Disp: , Rfl: 6 .  KLOR-CON M20 20 MEQ tablet, Take 20 mEq by mouth daily., Disp: , Rfl: 3 .  loratadine (CLARITIN) 10 MG tablet, Take 1 tablet by mouth as needed., Disp: , Rfl:  .  metoprolol tartrate (LOPRESSOR) 25 MG tablet, Take 1 tablet by mouth daily., Disp: , Rfl:  .  SPIRIVA HANDIHALER 18 MCG inhalation capsule, as needed., Disp: , Rfl: 3 .  torsemide (DEMADEX) 20 MG tablet, Take 40 mg by mouth daily., Disp: , Rfl: 6 .  traZODone (DESYREL) 50 MG tablet, Take 1 tablet by mouth as needed., Disp: , Rfl:  .  warfarin (COUMADIN) 5 MG tablet, 5 tablets daily., Disp:  , Rfl: 5  Allergies  Allergen Reactions  . Amoxicillin-Pot Clavulanate Nausea And Vomiting  . Codeine   . Penicillins Nausea Only  . Rosuvastatin   . Tetanus-Diphtheria Toxoids Td     ROS  Ten systems reviewed and is negative except as mentioned in HPI  Objective  Filed Vitals:   02/09/15 1117  BP: 120/72  Pulse: 80  Temp: 98 F (36.7 C)  TempSrc: Oral  Resp: 16  Height: 5' 2.5" (1.588 m)  Weight: 184 lb 11.2 oz (83.779 kg)  SpO2: 97%     Physical Exam  Constitutional: Patient appears well-developed and well-nourished. No distress.  HENT: Head: Normocephalic and atraumatic. Ears: B TMs ok, no erythema or effusion; Nose: Nose normal. Mouth/Throat: Oropharynx is clear and moist. No oropharyngeal exudate.  Eyes: Conjunctivae and EOM are normal. Pupils are equal, round, and reactive to light. No scleral icterus.  Neck: Normal range of motion. Neck supple. No JVD present. No thyromegaly present.  Cardiovascular: Normal rate, regular rhythm holosystolic ejection murmur heard at 2nd intercostal space right and left side, but worse on the left side. No BLE edema. Pulmonary/Chest: Effort normal and breath sounds normal. No respiratory distress. Musculoskeletal: Normal  range of motion, no joint effusions. No gross deformities Neurological: he is alert and oriented to person, place, and time. No cranial nerve deficit. Coordination, balance, strength, speech and gait are normal.  Skin: Skin is warm and dry. No rash noted. No erythema.  Psychiatric: Patient has a normal mood and affect. behavior is normal. Judgment and thought content normal.     Assessment & Plan   1. Palpitations Likely from skipping two doses of metoprolol - EKG 12-Lead, go to [facility] if symptoms returns   2. Paroxysmal atrial fibrillation EKG showed left bundle branch block - but sinus rhythm, she skipped metoprolol last night and this am, but took a dose after the episode of palpitation and is back to  baseline now  3. Non compliance w medication regimen Explained importance of taking all her medications as prescribed to avoid complications

## 2015-02-12 ENCOUNTER — Encounter: Payer: Self-pay | Admitting: Family Medicine

## 2015-02-12 ENCOUNTER — Telehealth: Payer: Self-pay | Admitting: Family Medicine

## 2015-02-12 NOTE — Telephone Encounter (Signed)
Pt need a note for her office visit from last week. Please put on there that the patient is okay to return to work and that her condition is not keeping her from working. She would like for you to state that reason for her visit to see you was because she had not taken her medication.

## 2015-02-13 DIAGNOSIS — I4891 Unspecified atrial fibrillation: Secondary | ICD-10-CM | POA: Diagnosis not present

## 2015-02-19 ENCOUNTER — Encounter: Payer: Self-pay | Admitting: Family Medicine

## 2015-02-20 ENCOUNTER — Ambulatory Visit (INDEPENDENT_AMBULATORY_CARE_PROVIDER_SITE_OTHER): Payer: Medicare Other | Admitting: Family Medicine

## 2015-02-20 ENCOUNTER — Encounter: Payer: Self-pay | Admitting: Family Medicine

## 2015-02-20 VITALS — BP 130/80 | HR 92 | Temp 98.3°F | Resp 16 | Ht 63.0 in | Wt 178.0 lb

## 2015-02-20 DIAGNOSIS — E8881 Metabolic syndrome: Secondary | ICD-10-CM | POA: Diagnosis not present

## 2015-02-20 DIAGNOSIS — K7689 Other specified diseases of liver: Secondary | ICD-10-CM

## 2015-02-20 DIAGNOSIS — J449 Chronic obstructive pulmonary disease, unspecified: Secondary | ICD-10-CM | POA: Diagnosis not present

## 2015-02-20 DIAGNOSIS — M109 Gout, unspecified: Secondary | ICD-10-CM

## 2015-02-20 DIAGNOSIS — R945 Abnormal results of liver function studies: Secondary | ICD-10-CM

## 2015-02-20 DIAGNOSIS — I5022 Chronic systolic (congestive) heart failure: Secondary | ICD-10-CM

## 2015-02-20 DIAGNOSIS — E559 Vitamin D deficiency, unspecified: Secondary | ICD-10-CM

## 2015-02-20 DIAGNOSIS — R739 Hyperglycemia, unspecified: Secondary | ICD-10-CM | POA: Diagnosis not present

## 2015-02-20 DIAGNOSIS — M1009 Idiopathic gout, multiple sites: Secondary | ICD-10-CM

## 2015-02-20 DIAGNOSIS — I10 Essential (primary) hypertension: Secondary | ICD-10-CM

## 2015-02-20 DIAGNOSIS — E785 Hyperlipidemia, unspecified: Secondary | ICD-10-CM

## 2015-02-20 NOTE — Patient Instructions (Signed)

## 2015-02-20 NOTE — Progress Notes (Signed)
Name: [name]   MRN: [id number]    DOB: [birth year]   Date:02/20/2015       Progress Note  Subjective  Chief Complaint  Chief Complaint  Patient presents with  . Hyperlipidemia  . Gout  . Atrial Fibrillation  . COPD  . Insomnia    HPI  Hyperlipidemia: taking Atorvastatin, but she skipped last couple days because she went to bed very late and did not want to take it on an empty stomach, usually compliant. Denies side effects.   HTN: she has been taking Metoprolol and Torsemide, she has been doing well, she also has Afib and rate controlled.  Taking coumadin 5mg  and levels are checked by cardiologist , per patient last level was 2.5 .  Gout: she takes Allopurinol, she has also modified her diet and no episodes in months.   Insomnia: she has been walking daily and has been sleeping well, also stress is lower and is helping her sleep better. Taking Trazodone  Chronic systolic heart failure: taking beta-blocker, coumadin, torsemide, aspirin, and beta blocker, no orthopnea, no swelling, mild sob with activity. She walked 1.25 miles this am  COPD: she had to flares of COPD in 2016, and she had to take prednisone, she states she stopped taking Spiriva at this time, she has a productive cough in am's, mild sob with activity, no fever   Patient Active Problem List   Diagnosis Date Noted  . Abnormal finding on liver function 02/09/2015  . Anxiety 02/09/2015  . Synovial cyst of popliteal space 02/09/2015  . Benign hypertension 02/09/2015  . Blood type A+ 02/09/2015  . Arterial branch occlusion of retina 02/09/2015  . Chronic constipation 02/09/2015  . Insomnia, persistent 02/09/2015  . Chronic systolic heart failure 51/76/1607  . CAFL (chronic airflow limitation) 02/09/2015  . Compromised kidney function 02/09/2015  . Dyslipidemia 02/09/2015  . Atrial fibrillation 02/09/2015  . Arthritis urica 02/09/2015  . Hearing loss 02/09/2015  . History of open heart surgery 02/09/2015   . Chronic hoarseness 02/09/2015  . Cardiomegaly 02/09/2015  . Dysmetabolic syndrome 37/06/6268  . Persistent migraine aura 02/09/2015  . Adult BMI 30+ 02/09/2015  . Hypertensive pulmonary vascular disease 02/09/2015  . Allergic rhinitis, seasonal 02/09/2015  . Vitamin D deficiency 02/09/2015  . AI (aortic incompetence) 10/01/2012  . Aortic heart valve narrowing 10/01/2012  . A-fib 10/01/2012  . CCF (congestive cardiac failure) 10/01/2012  . Hypercholesteremia 10/01/2012  . Hypertension 10/01/2012    Past Surgical History  Procedure Laterality Date  . Abdominal hysterectomy  1996  . Cardiac valve replacement  2014    Family History  Problem Relation Age of Onset  . Cancer Mother   . Diabetes Mother   . Hypertension Father   . Hypertension Mother   . Heart failure Father   . Cancer Brother     bladder  . Cancer Sister     breast  . Hypertension Son   . Hypertension Daughter     History   Social History  . Marital Status: Widowed    Spouse Name: N/A  . Number of Children: 2  . Years of Education: N/A   Occupational History  . Custodian    Social History Main Topics  . Smoking status: Former Smoker    Quit date: 09/09/2007  . Smokeless tobacco: Never Used  . Alcohol Use: No  . Drug Use: No  . Sexual Activity: Not Currently   Other Topics Concern  . Not on file   Social  History Narrative     Current outpatient prescriptions:  .  allopurinol (ZYLOPRIM) 300 MG tablet, Take 300 mg by mouth daily., Disp: , Rfl: 3 .  aspirin EC 81 MG tablet, Take 1 tablet by mouth daily., Disp: , Rfl:  .  atorvastatin (LIPITOR) 80 MG tablet, Take 80 mg by mouth daily., Disp: , Rfl: 3 .  colchicine 0.6 MG tablet, 1 tablet as needed., Disp: , Rfl:  .  diazepam (VALIUM) 5 MG tablet, Take 1 tablet by mouth as needed., Disp: , Rfl:  .  fluticasone (FLONASE) 50 MCG/ACT nasal spray, , Disp: , Rfl: 6 .  KLOR-CON M20 20 MEQ tablet, Take 20 mEq by mouth daily., Disp: , Rfl: 3 .   loratadine (CLARITIN) 10 MG tablet, Take 1 tablet by mouth as needed., Disp: , Rfl:  .  metoprolol tartrate (LOPRESSOR) 25 MG tablet, Take 1 tablet by mouth daily., Disp: , Rfl:  .  SPIRIVA HANDIHALER 18 MCG inhalation capsule, as needed., Disp: , Rfl: 3 .  torsemide (DEMADEX) 20 MG tablet, Take 40 mg by mouth daily., Disp: , Rfl: 6 .  traZODone (DESYREL) 50 MG tablet, Take 1 tablet by mouth as needed., Disp: , Rfl:  .  warfarin (COUMADIN) 5 MG tablet, 5 tablets daily., Disp: , Rfl: 5  Allergies  Allergen Reactions  . Amoxicillin-Pot Clavulanate Nausea And Vomiting  . Codeine   . Penicillins Nausea Only  . Rosuvastatin   . Tetanus-Diphtheria Toxoids Td      ROS  Constitutional: Negative for fever, weight loss, secondary to change in medication.  Respiratory: positive for  chest pain or palpitations.  Gastrointestinal: Negative for abdominal pain, no bowel changes.  Musculoskeletal: Negative for gait problem or joint swelling.  Skin: Negative for rash.  Neurological: Negative for dizziness or headache.  No other specific complaints in a complete review of systems (except as listed in HPI above).  Objective  Filed Vitals:   02/20/15 1343  BP: 130/80  Pulse: 92  Temp: 98.3 F (36.8 C)  TempSrc: Oral  Resp: 16  Height: 5\' 3"  (1.6 m)  Weight: 178 lb (80.74 kg)  SpO2: 97%    Body mass index is 31.54 kg/(m^2).  Physical Exam Constitutional: Patient appears well-developed and well-nourished. No distress.  HENT: Head: Normocephalic and atraumatic.  Nose: Nose normal. Mouth/Throat: Oropharynx is clear and moist. No oropharyngeal exudate.  Eyes: Conjunctivae and EOM are normal. Pupils are equal, round, and reactive to light. No scleral icterus.  Neck: Normal range of motion. Neck supple. No JVD present. No thyromegaly present.  Cardiovascular: Normal rate, irregular rhythm, systolic murmur 3/5 on 2nd intercostal spaces bilaterally, louder on left border.  No BLE  edema. Pulmonary/Chest: Effort normal and breath sounds normal. No respiratory distress. Abdominal: Soft. Bowel sounds are normal, no distension. There is no tenderness. no masses Musculoskeletal: Normal range of motion, no joint effusions. No gross deformities Neurological: he is alert and oriented to person, place, and time. No cranial nerve deficit. Coordination, balance, strength, speech and gait are normal.  Skin: Skin is warm and dry. No rash noted. No erythema.  Psychiatric: Patient has a normal mood and affect. behavior is normal. Judgment and thought content normal.   PHQ2/9: Depression screen PHQ 2/9 02/09/2015  Decreased Interest 0  Down, Depressed, Hopeless 2  PHQ - 2 Score 2  Altered sleeping 0  Tired, decreased energy 0  Change in appetite 1  Feeling bad or failure about yourself  1  Trouble concentrating 0  Moving slowly or fidgety/restless 0  Suicidal thoughts 0  PHQ-9 Score 4  Difficult doing work/chores Somewhat difficult    Fall Risk: Fall Risk  02/09/2015  Falls in the past year? No    Assessment & Plan  1. Chronic obstructive pulmonary disease, unspecified COPD, unspecified chronic bronchitis type Normal spirometry, advised to resume Spiriva to improve symptoms and prevent flares - Spirometry with graph; Future  2. Dyslipidemia On Atorvastatin, recheck labs - Lipid Profile  3. Abnormal finding on liver function Recheck labs  4. Vitamin D deficiency Recheck labs, ABM form filled out - Vitamin D (25 hydroxy)  5. Essential hypertension At goal, continue medication - Comprehensive Metabolic Panel (CMET) - CBC with Differential  6. Arthritis urica  - Uric acid  7. Dysmetabolic syndrome Discussed importance to continue weight loss  8. Hyperglycemia  - HgB A1c  9. Chronic systolic congestive heart failure Doing well, continue current management

## 2015-02-21 DIAGNOSIS — R739 Hyperglycemia, unspecified: Secondary | ICD-10-CM | POA: Diagnosis not present

## 2015-02-21 DIAGNOSIS — E559 Vitamin D deficiency, unspecified: Secondary | ICD-10-CM | POA: Diagnosis not present

## 2015-02-21 DIAGNOSIS — E785 Hyperlipidemia, unspecified: Secondary | ICD-10-CM | POA: Diagnosis not present

## 2015-02-21 DIAGNOSIS — M1009 Idiopathic gout, multiple sites: Secondary | ICD-10-CM | POA: Diagnosis not present

## 2015-02-21 DIAGNOSIS — I10 Essential (primary) hypertension: Secondary | ICD-10-CM | POA: Diagnosis not present

## 2015-02-22 LAB — COMPREHENSIVE METABOLIC PANEL
ALBUMIN: 4 g/dL (ref 3.5–4.8)
ALT: 25 IU/L (ref 0–32)
AST: 30 IU/L (ref 0–40)
Albumin/Globulin Ratio: 1.6 (ref 1.1–2.5)
Alkaline Phosphatase: 91 IU/L (ref 39–117)
BUN/Creatinine Ratio: 23 (ref 11–26)
BUN: 19 mg/dL (ref 8–27)
Bilirubin Total: 0.4 mg/dL (ref 0.0–1.2)
CHLORIDE: 101 mmol/L (ref 97–108)
CO2: 25 mmol/L (ref 18–29)
CREATININE: 0.81 mg/dL (ref 0.57–1.00)
Calcium: 9.2 mg/dL (ref 8.7–10.3)
GFR calc Af Amer: 85 mL/min/{1.73_m2} (ref >59)
GFR calc non Af Amer: 73 mL/min/{1.73_m2} (ref >59)
GLOBULIN, TOTAL: 2.5 g/dL (ref 1.5–4.5)
GLUCOSE: 111 mg/dL — AB (ref 65–99)
Potassium: 3.8 mmol/L (ref 3.5–5.2)
Sodium: 143 mmol/L (ref 134–144)
TOTAL PROTEIN: 6.5 g/dL (ref 6.0–8.5)

## 2015-02-22 LAB — LIPID PANEL
Chol/HDL Ratio: 4.8 ratio units — ABNORMAL HIGH (ref 0.0–4.4)
Cholesterol, Total: 213 mg/dL — ABNORMAL HIGH (ref 100–199)
HDL: 44 mg/dL (ref >39)
LDL Calculated: 152 mg/dL — ABNORMAL HIGH (ref 0–99)
TRIGLYCERIDES: 84 mg/dL (ref 0–149)
VLDL Cholesterol Cal: 17 mg/dL (ref 5–40)

## 2015-02-22 LAB — URIC ACID: URIC ACID: 12 mg/dL — AB (ref 2.5–7.1)

## 2015-02-22 LAB — CBC WITH DIFFERENTIAL/PLATELET
BASOS: 0 %
Basophils Absolute: 0 10*3/uL (ref 0.0–0.2)
EOS (ABSOLUTE): 0.1 10*3/uL (ref 0.0–0.4)
Eos: 2 %
HEMATOCRIT: 37.7 % (ref 34.0–46.6)
HEMOGLOBIN: 12.1 g/dL (ref 11.1–15.9)
Immature Grans (Abs): 0 10*3/uL (ref 0.0–0.1)
Immature Granulocytes: 0 %
Lymphocytes Absolute: 2 10*3/uL (ref 0.7–3.1)
Lymphs: 44 %
MCH: 28.7 pg (ref 26.6–33.0)
MCHC: 32.1 g/dL (ref 31.5–35.7)
MCV: 89 fL (ref 79–97)
MONOS ABS: 0.5 10*3/uL (ref 0.1–0.9)
Monocytes: 12 %
NEUTROS ABS: 1.9 10*3/uL (ref 1.4–7.0)
Neutrophils: 42 %
Platelets: 230 10*3/uL (ref 150–379)
RBC: 4.22 x10E6/uL (ref 3.77–5.28)
RDW: 15.1 % (ref 12.3–15.4)
WBC: 4.5 10*3/uL (ref 3.4–10.8)

## 2015-02-22 LAB — VITAMIN D 25 HYDROXY (VIT D DEFICIENCY, FRACTURES): Vit D, 25-Hydroxy: 18 ng/mL — ABNORMAL LOW (ref 30.0–100.0)

## 2015-02-22 LAB — HEMOGLOBIN A1C
Est. average glucose Bld gHb Est-mCnc: 123 mg/dL
Hgb A1c MFr Bld: 5.9 % — ABNORMAL HIGH (ref 4.8–5.6)

## 2015-02-22 MED ORDER — VITAMIN D (ERGOCALCIFEROL) 1.25 MG (50000 UNIT) PO CAPS: 50000.0000 [IU] | ORAL_CAPSULE | ORAL | Status: DC

## 2015-02-22 NOTE — Addendum Note (Signed)
Addended by: [name] F on: 02/22/2015 01:26 PM   Modules accepted: Orders

## 2015-02-28 ENCOUNTER — Encounter: Payer: Self-pay | Admitting: Family Medicine

## 2015-03-13 DIAGNOSIS — I4891 Unspecified atrial fibrillation: Secondary | ICD-10-CM | POA: Diagnosis not present

## 2015-04-07 ENCOUNTER — Other Ambulatory Visit: Payer: Self-pay | Admitting: Family Medicine

## 2015-04-26 DIAGNOSIS — I4891 Unspecified atrial fibrillation: Secondary | ICD-10-CM | POA: Diagnosis not present

## 2015-05-10 ENCOUNTER — Telehealth: Payer: Self-pay | Admitting: Family Medicine

## 2015-05-10 ENCOUNTER — Other Ambulatory Visit: Payer: Self-pay

## 2015-05-10 MED ORDER — KLOR-CON M20 20 MEQ PO TBCR: 20.0000 meq | EXTENDED_RELEASE_TABLET | Freq: Every day | ORAL | Status: DC

## 2015-05-10 NOTE — Telephone Encounter (Signed)
Pt needs refill on [place] to be sent to [facility]. Pt has an appt 06/06/15

## 2015-06-06 ENCOUNTER — Ambulatory Visit (INDEPENDENT_AMBULATORY_CARE_PROVIDER_SITE_OTHER): Payer: Medicare Other | Admitting: Family Medicine

## 2015-06-06 ENCOUNTER — Ambulatory Visit: Payer: Medicare Other | Admitting: Family Medicine

## 2015-06-06 ENCOUNTER — Encounter: Payer: Self-pay | Admitting: Family Medicine

## 2015-06-06 VITALS — BP 122/64 | HR 66 | Temp 97.7°F | Resp 16 | Ht 63.0 in | Wt 174.9 lb

## 2015-06-06 DIAGNOSIS — E559 Vitamin D deficiency, unspecified: Secondary | ICD-10-CM

## 2015-06-06 DIAGNOSIS — R739 Hyperglycemia, unspecified: Secondary | ICD-10-CM | POA: Diagnosis not present

## 2015-06-06 DIAGNOSIS — M109 Gout, unspecified: Secondary | ICD-10-CM

## 2015-06-06 DIAGNOSIS — E8881 Metabolic syndrome: Secondary | ICD-10-CM

## 2015-06-06 DIAGNOSIS — I5042 Chronic combined systolic (congestive) and diastolic (congestive) heart failure: Secondary | ICD-10-CM | POA: Diagnosis not present

## 2015-06-06 DIAGNOSIS — Z952 Presence of prosthetic heart valve: Secondary | ICD-10-CM

## 2015-06-06 DIAGNOSIS — G43109 Migraine with aura, not intractable, without status migrainosus: Secondary | ICD-10-CM | POA: Diagnosis not present

## 2015-06-06 DIAGNOSIS — Z954 Presence of other heart-valve replacement: Secondary | ICD-10-CM | POA: Diagnosis not present

## 2015-06-06 DIAGNOSIS — I10 Essential (primary) hypertension: Secondary | ICD-10-CM | POA: Diagnosis not present

## 2015-06-06 DIAGNOSIS — I480 Paroxysmal atrial fibrillation: Secondary | ICD-10-CM | POA: Diagnosis not present

## 2015-06-06 DIAGNOSIS — F419 Anxiety disorder, unspecified: Secondary | ICD-10-CM

## 2015-06-06 DIAGNOSIS — E785 Hyperlipidemia, unspecified: Secondary | ICD-10-CM | POA: Diagnosis not present

## 2015-06-06 DIAGNOSIS — J302 Other seasonal allergic rhinitis: Secondary | ICD-10-CM | POA: Diagnosis not present

## 2015-06-06 DIAGNOSIS — Z23 Encounter for immunization: Secondary | ICD-10-CM

## 2015-06-06 DIAGNOSIS — M7701 Medial epicondylitis, right elbow: Secondary | ICD-10-CM

## 2015-06-06 DIAGNOSIS — I4891 Unspecified atrial fibrillation: Secondary | ICD-10-CM | POA: Diagnosis not present

## 2015-06-06 MED ORDER — DIAZEPAM 5 MG PO TABS: 5.0000 mg | ORAL_TABLET | ORAL | Status: DC | PRN

## 2015-06-06 MED ORDER — METOPROLOL SUCCINATE ER 25 MG PO TB24: 25.0000 mg | ORAL_TABLET | Freq: Every day | ORAL | Status: DC

## 2015-06-06 MED ORDER — VITAMIN D (ERGOCALCIFEROL) 1.25 MG (50000 UNIT) PO CAPS: 50000.0000 [IU] | ORAL_CAPSULE | ORAL | Status: DC

## 2015-06-06 MED ORDER — ATORVASTATIN CALCIUM 80 MG PO TABS: 80.0000 mg | ORAL_TABLET | Freq: Every day | ORAL | Status: DC

## 2015-06-06 MED ORDER — METFORMIN HCL 500 MG PO TABS: 500.0000 mg | ORAL_TABLET | Freq: Two times a day (BID) | ORAL | Status: DC

## 2015-06-06 MED ORDER — TENNIS ELBOW NEOPRENE BRACE MISC: 1.0000 [IU] | Freq: Every day | Status: DC

## 2015-06-06 NOTE — Progress Notes (Signed)
Name: [name]   MRN: [id number]    DOB: 1941/01/01   Date:06/06/2015       Progress Note  Subjective  Chief Complaint  Chief Complaint  Patient presents with  . Medication Refill    follow-up  . Hypertension  . Hyperlipidemia  . Insomnia  . Anxiety  . Allergic Rhinitis   . Gout    HPI  Chronic combined CHF: she is on Demadex and potassium, on beta-blocker . No chest or SOB, she denies orthopnea. She has been compliant with medication  HTN: taking metoprolol half pill twice daily, doing well, bp much better controlled since aortic valve replacement.   Hyperlipidemia: last labs were not at goal, she was not taking Lipitor daily, but she is now, denies side effects of medication   Insomnia: sleeping well, only taking Trazodone prn, and denies snoring, wakes up feeling refreshed.   Gout: not recent symptoms, uric acid was 12 in June.  She is on Allopurinol and denies side effects  Migraine headache: very seldom has episodes, usually just see the wiggly lines and occasionally tip of her nose goes numb, lasts about 30 minutes and resolves by itself, the headache never occurs.   Dysmetabolic Syndrome: last YBOF7P 5.9%, she is not very compliant with diet.  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 She states symptoms have improved since she has been off. She is on year round school schedule. Advised a tennis elbow brace   Patient Active Problem List   Diagnosis Date Noted  . Controlled gout 06/06/2015  . Anxiety 02/09/2015  . Synovial cyst of popliteal space 02/09/2015  . Benign hypertension 02/09/2015  . Blood type A+ 02/09/2015  . Arterial branch occlusion of retina 02/09/2015  . Chronic constipation 02/09/2015  . Insomnia, persistent 02/09/2015  . Chronic combined systolic and diastolic  CHF, NYHA class 1 02/09/2015  . CAFL (chronic airflow limitation) 02/09/2015  . Compromised kidney function 02/09/2015  . Dyslipidemia 02/09/2015  . Atrial fibrillation 02/09/2015  . Arthritis urica 02/09/2015  . Hearing loss 02/09/2015  . History of open heart surgery 02/09/2015  . Chronic hoarseness 02/09/2015  . Cardiomegaly 02/09/2015  . Dysmetabolic syndrome 07/02/8526  . Migraine with aura and without status migrainosus, not intractable 02/09/2015  . Adult BMI 30+ 02/09/2015  . Hypertensive pulmonary vascular disease 02/09/2015  . Allergic rhinitis, seasonal 02/09/2015  . Vitamin D deficiency 02/09/2015  . History of mitral valve repair 10/01/2012  . History of aortic valve replacement 10/01/2012  . A-fib 10/01/2012    Past Surgical History  Procedure Laterality Date  . Abdominal hysterectomy  1996  . Cardiac valve replacement  2014    Family History  Problem Relation Age of Onset  . Cancer Mother   . Diabetes Mother   . Hypertension Father   . Hypertension Mother   . Heart failure Father   . Cancer Brother     bladder  . Cancer Sister     breast  . Hypertension Son   . Hypertension Daughter     Social History   Social History  . Marital Status: Widowed    Spouse Name: N/A  . Number of Children: 2  . Years of Education: N/A   Occupational History  . Custodian    Social History Main Topics  . Smoking status: Former  Smoker    Quit date: 09/09/2007  . Smokeless tobacco: Never Used  . Alcohol Use: No  . Drug Use: No  . Sexual Activity: Not Currently   Other Topics Concern  . Not on file   Social History Narrative     Current outpatient prescriptions:  .  allopurinol (ZYLOPRIM) 300 MG tablet, TAKE 1 TABLET BY MOUTH DAILY, Disp: 30 tablet, Rfl: 3 .  aspirin EC 81 MG tablet, Take 1 tablet by mouth daily., Disp: , Rfl:  .  atorvastatin (LIPITOR) 80 MG tablet, Take 1 tablet (80 mg total) by mouth daily., Disp: 30 tablet, Rfl: 3 .  colchicine 0.6 MG  tablet, 1 tablet as needed., Disp: , Rfl:  .  diazepam (VALIUM) 5 MG tablet, Take 1 tablet by mouth as needed., Disp: , Rfl:  .  KLOR-CON M20 20 MEQ tablet, Take 1 tablet (20 mEq total) by mouth daily., Disp: 90 tablet, Rfl: 0 .  loratadine (CLARITIN) 10 MG tablet, Take 1 tablet by mouth as needed., Disp: , Rfl:  .  metoprolol succinate (TOPROL-XL) 25 MG 24 hr tablet, Take 1 tablet (25 mg total) by mouth daily., Disp: 30 tablet, Rfl: 3 .  SPIRIVA HANDIHALER 18 MCG inhalation capsule, as needed., Disp: , Rfl: 3 .  torsemide (DEMADEX) 20 MG tablet, Take 40 mg by mouth daily., Disp: , Rfl: 6 .  traZODone (DESYREL) 50 MG tablet, Take 1 tablet by mouth as needed., Disp: , Rfl:  .  Vitamin D, Ergocalciferol, (DRISDOL) 50000 UNITS CAPS capsule, Take 1 capsule (50,000 Units total) by mouth every 7 (seven) days., Disp: 12 capsule, Rfl: 1 .  warfarin (COUMADIN) 5 MG tablet, 5 tablets daily., Disp: , Rfl: 5  Allergies  Allergen Reactions  . Amoxicillin-Pot Clavulanate Nausea And Vomiting and Other (See Comments)  . Codeine   . Penicillins Nausea Only  . Rosuvastatin   . Tetanus-Diphtheria Toxoids Td      ROS  Constitutional: Negative for fever or weight change.  Respiratory: Negative for cough and shortness of breath.   Cardiovascular: Negative for chest pain or palpitations.  Gastrointestinal: Negative for abdominal pain, no bowel changes.  Musculoskeletal: Negative for gait problem or joint swelling.  Skin: Negative for rash.  Neurological: Negative for dizziness or headache.  No other specific complaints in a complete review of systems (except as listed in HPI above).  Objective  Filed Vitals:   06/06/15 1014  BP: 122/64  Pulse: 66  Temp: 97.7 F (36.5 C)  TempSrc: Oral  Resp: 16  Height: 5\' 3"  (1.6 m)  Weight: 174 lb 14.4 oz (79.334 kg)  SpO2: 97%    Body mass index is 30.99 kg/(m^2).  Physical Exam  Constitutional: Patient appears well-developed and well-nourished. No  distress.  HENT: Head: Normocephalic and atraumatic. Nose: Nose normal. Mouth/Throat: Oropharynx is clear and moist. No oropharyngeal exudate.  Eyes: Conjunctivae and EOM are normal. Pupils are equal, round, and reactive to light. No scleral icterus.  Neck: Normal range of motion. Neck supple. No JVD present. No thyromegaly present.  Cardiovascular: Normal rate, irregular rhythm, systolic murmur 3/5 on 2nd intercostal spaces bilaterally, louder on left border. No BLE edema. Pulmonary/Chest: Effort normal and breath sounds normal. No respiratory distress. Abdominal: Soft. Bowel sounds are normal, no distension. There is no tenderness. no masses Musculoskeletal: Normal range of motion, no joint effusions. No gross deformities. Pain during palpation of right medial epicondyle Neurological: he is alert and oriented to person, place, and time. No cranial nerve  deficit. Coordination, balance, strength, speech and gait are normal.  Skin: Skin is warm and dry. No rash noted. No erythema.  Psychiatric: Patient has a normal mood and affect. behavior is normal. Judgment and thought content normal  PHQ2/9: Depression screen [facility] 2/9 06/06/2015 02/09/2015  Decreased Interest 0 0  Down, Depressed, Hopeless 0 2  PHQ - 2 Score 0 2  Altered sleeping - 0  Tired, decreased energy - 0  Change in appetite - 1  Feeling bad or failure about yourself  - 1  Trouble concentrating - 0  Moving slowly or fidgety/restless - 0  Suicidal thoughts - 0  PHQ-9 Score - 4  Difficult doing work/chores - Somewhat difficult     Fall Risk: Fall Risk  06/06/2015 02/09/2015  Falls in the past year? No No     Functional Status Survey: Is the patient deaf or have difficulty hearing?: No Does the patient have difficulty seeing, even when wearing glasses/contacts?: Yes (glasses) Does the patient have difficulty concentrating, remembering, or making decisions?: No Does the patient have difficulty walking or climbing stairs?:  No Does the patient have difficulty dressing or bathing?: No Does the patient have difficulty doing errands alone such as visiting a doctor's office or shopping?: No   Assessment & Plan  1. Migraine with aura and without status migrainosus, not intractable  Doing well  2. Needs flu shot  - Flu vaccine HIGH DOSE PF (Fluzone High dose) - she will return next week   3. Chronic combined systolic and diastolic CHF, NYHA class 1  - metoprolol succinate (TOPROL-XL) 25 MG 24 hr tablet; Take 1 tablet (25 mg total) by mouth daily.  Dispense: 30 tablet; Refill: 3  4. History of aortic valve replacement  Doing well   5. Paroxysmal atrial fibrillation   sees Dr. Clayborn Bigness - on coumadin   6. Benign hypertension  - metoprolol succinate (TOPROL-XL) 25 MG 24 hr tablet; Take 1 tablet (25 mg total) by mouth daily.  Dispense: 30 tablet; Refill: 3  7. Dysmetabolic syndrome  She wants to try metformin  - metFORMIN (GLUCOPHAGE) 500 MG tablet; Take 1 tablet (500 mg total) by mouth 2 (two) times daily with a meal.  Dispense: 60 tablet; Refill: 3  8. Anxiety  - diazepam (VALIUM) 5 MG tablet; Take 1 tablet (5 mg total) by mouth as needed.  Dispense: 30 tablet; Refill: 0  9. Vitamin D deficiency  - Vitamin D, Ergocalciferol, (DRISDOL) 50000 UNITS CAPS capsule; Take 1 capsule (50,000 Units total) by mouth every 7 (seven) days.  Dispense: 12 capsule; Refill: 1  10. Allergic rhinitis, seasonal  stable  11. Dyslipidemia  She has been compliant with medication for the past few months, recheck labs - atorvastatin (LIPITOR) 80 MG tablet; Take 1 tablet (80 mg total) by mouth daily.  Dispense: 30 tablet; Refill: 3 - Lipid panel  12. Hyperglycemia  - metFORMIN (GLUCOPHAGE) 500 MG tablet; Take 1 tablet (500 mg total) by mouth 2 (two) times daily with a meal.  Dispense: 60 tablet; Refill: 3  13. Controlled gout  - Uric acid  14. Epicondylitis elbow, medial, right  - Elastic Bandages &  Supports (TENNIS ELBOW NEOPRENE BRACE) MISC; 1 Units by Does not apply route daily.  Dispense: 1 each; Refill: 0

## 2015-06-12 ENCOUNTER — Ambulatory Visit: Payer: Medicare Other | Admitting: Family Medicine

## 2015-06-21 ENCOUNTER — Other Ambulatory Visit: Payer: Self-pay | Admitting: Family Medicine

## 2015-06-25 DIAGNOSIS — I5042 Chronic combined systolic (congestive) and diastolic (congestive) heart failure: Secondary | ICD-10-CM | POA: Diagnosis not present

## 2015-06-25 DIAGNOSIS — M109 Gout, unspecified: Secondary | ICD-10-CM | POA: Diagnosis not present

## 2015-06-25 DIAGNOSIS — E785 Hyperlipidemia, unspecified: Secondary | ICD-10-CM | POA: Diagnosis not present

## 2015-06-26 LAB — COMPREHENSIVE METABOLIC PANEL
ALBUMIN: 4.4 g/dL (ref 3.5–4.8)
ALT: 26 IU/L (ref 0–32)
AST: 31 IU/L (ref 0–40)
Albumin/Globulin Ratio: 1.8 (ref 1.1–2.5)
Alkaline Phosphatase: 91 IU/L (ref 39–117)
BUN / CREAT RATIO: 23 (ref 11–26)
BUN: 18 mg/dL (ref 8–27)
Bilirubin Total: 0.6 mg/dL (ref 0.0–1.2)
CALCIUM: 9.5 mg/dL (ref 8.7–10.3)
CHLORIDE: 102 mmol/L (ref 97–106)
CO2: 26 mmol/L (ref 18–29)
CREATININE: 0.79 mg/dL (ref 0.57–1.00)
GFR, EST AFRICAN AMERICAN: 86 mL/min/{1.73_m2} (ref >59)
GFR, EST NON AFRICAN AMERICAN: 75 mL/min/{1.73_m2} (ref >59)
GLUCOSE: 106 mg/dL — AB (ref 65–99)
Globulin, Total: 2.5 g/dL (ref 1.5–4.5)
Potassium: 4.2 mmol/L (ref 3.5–5.2)
Sodium: 142 mmol/L (ref 136–144)
TOTAL PROTEIN: 6.9 g/dL (ref 6.0–8.5)

## 2015-06-26 LAB — LIPID PANEL
CHOL/HDL RATIO: 4.1 ratio (ref 0.0–4.4)
Cholesterol, Total: 203 mg/dL — ABNORMAL HIGH (ref 100–199)
HDL: 49 mg/dL (ref >39)
LDL Calculated: 129 mg/dL — ABNORMAL HIGH (ref 0–99)
Triglycerides: 127 mg/dL (ref 0–149)
VLDL Cholesterol Cal: 25 mg/dL (ref 5–40)

## 2015-06-26 LAB — URIC ACID: Uric Acid: 4 mg/dL (ref 2.5–7.1)

## 2015-07-02 ENCOUNTER — Telehealth: Payer: Self-pay | Admitting: Family Medicine

## 2015-07-02 NOTE — Telephone Encounter (Signed)
Pt states she needs z-pac and Tussinex, she states her mucus is yellow. [facility].

## 2015-07-02 NOTE — Telephone Encounter (Signed)
I can't call in antibiotics and tussionex. Needs to be seen, but explain to her that it may not be a bacterial infection

## 2015-07-02 NOTE — Telephone Encounter (Signed)
Sent to Dr. Sowles  

## 2015-07-03 NOTE — Telephone Encounter (Signed)
Pt scheduled an appt for 07/04/15

## 2015-07-04 ENCOUNTER — Encounter: Payer: Self-pay | Admitting: Family Medicine

## 2015-07-04 ENCOUNTER — Ambulatory Visit (INDEPENDENT_AMBULATORY_CARE_PROVIDER_SITE_OTHER): Payer: Medicare Other | Admitting: Family Medicine

## 2015-07-04 VITALS — BP 122/68 | HR 86 | Temp 97.6°F | Resp 16 | Ht 62.0 in | Wt 178.3 lb

## 2015-07-04 DIAGNOSIS — J440 Chronic obstructive pulmonary disease with acute lower respiratory infection: Secondary | ICD-10-CM

## 2015-07-04 DIAGNOSIS — Z23 Encounter for immunization: Secondary | ICD-10-CM | POA: Diagnosis not present

## 2015-07-04 DIAGNOSIS — E785 Hyperlipidemia, unspecified: Secondary | ICD-10-CM

## 2015-07-04 DIAGNOSIS — J449 Chronic obstructive pulmonary disease, unspecified: Secondary | ICD-10-CM | POA: Insufficient documentation

## 2015-07-04 MED ORDER — HYDROCOD POLST-CPM POLST ER 10-8 MG/5ML PO SUER: 5.0000 mL | Freq: Two times a day (BID) | ORAL | Status: DC | PRN

## 2015-07-04 MED ORDER — PREDNISONE 10 MG PO TABS: 10.0000 mg | ORAL_TABLET | Freq: Two times a day (BID) | ORAL | Status: DC

## 2015-07-04 NOTE — Progress Notes (Signed)
Name: [name]   MRN: [id number]    DOB: 03/24/1941   Date:07/04/2015       Progress Note  Subjective  Chief Complaint  Chief Complaint  Patient presents with  . URI    onset 5 days sratchy throat, sinus drainage, coughing yellow, chest soreness    HPI  COPD exacerbation: feeling sick for the past four days. Started with scratchy throat, post-nasal drainage, followed a productive cough, thick and yellow in color. She has also noticed wheezing, but denies SOB. No fever.  She has not been using her Spiriva. Compliant with allergy medications. Last flare of COPD was April 2016, and last time she took antibiotics was 10/2014. No PND, no lower extremity edema. CHF has been under control  Hyperlipidemia: taking Lipitor daily since she had her labs done. LDL was above goal  Patient Active Problem List   Diagnosis Date Noted  . Chronic obstructive pulmonary disease ([place]) 07/04/2015  . Controlled gout 06/06/2015  . Anxiety 02/09/2015  . Synovial cyst of popliteal space 02/09/2015  . Benign hypertension 02/09/2015  . Blood type A+ 02/09/2015  . Arterial branch occlusion of retina 02/09/2015  . Chronic constipation 02/09/2015  . Insomnia, persistent 02/09/2015  . Chronic combined systolic and diastolic CHF, NYHA class 1 ([place]) 02/09/2015  . CAFL (chronic airflow limitation) ([place]) 02/09/2015  . Compromised kidney function 02/09/2015  . Dyslipidemia 02/09/2015  . Atrial fibrillation ([place]) 02/09/2015  . Arthritis urica 02/09/2015  . Hearing loss 02/09/2015  . History of open heart surgery 02/09/2015  . Chronic hoarseness 02/09/2015  . Cardiomegaly 02/09/2015  . Dysmetabolic syndrome 41/93/7902  . Migraine with aura and without status migrainosus, not intractable 02/09/2015  . Adult BMI 30+ 02/09/2015  . Hypertensive pulmonary vascular disease ([place]) 02/09/2015  . Allergic rhinitis, seasonal 02/09/2015  . Vitamin D deficiency 02/09/2015  . History of mitral valve repair 10/01/2012   . History of aortic valve replacement 10/01/2012  . A-fib ([place]) 10/01/2012    Past Surgical History  Procedure Laterality Date  . Abdominal hysterectomy  1996  . Cardiac valve replacement  2014    Family History  Problem Relation Age of Onset  . Cancer Mother   . Diabetes Mother   . Hypertension Father   . Hypertension Mother   . Heart failure Father   . Cancer Brother     bladder  . Cancer Sister     breast  . Hypertension Son   . Hypertension Daughter     Social History   Social History  . Marital Status: Widowed    Spouse Name: N/A  . Number of Children: 2  . Years of Education: N/A   Occupational History  . Custodian    Social History Main Topics  . Smoking status: Former Smoker    Quit date: 09/09/2007  . Smokeless tobacco: Never Used  . Alcohol Use: No  . Drug Use: No  . Sexual Activity: Not Currently   Other Topics Concern  . Not on file   Social History Narrative     Current outpatient prescriptions:  .  allopurinol (ZYLOPRIM) 300 MG tablet, TAKE 1 TABLET BY MOUTH DAILY, Disp: 30 tablet, Rfl: 3 .  aspirin EC 81 MG tablet, Take 1 tablet by mouth daily., Disp: , Rfl:  .  atorvastatin (LIPITOR) 80 MG tablet, Take 1 tablet (80 mg total) by mouth daily., Disp: 30 tablet, Rfl: 3 .  chlorpheniramine-HYDROcodone (TUSSIONEX PENNKINETIC ER) 10-8 MG/5ML SUER, Take 5 mLs by mouth every  12 (twelve) hours as needed for cough., Disp: 140 mL, Rfl: 0 .  colchicine 0.6 MG tablet, 1 tablet as needed., Disp: , Rfl:  .  diazepam (VALIUM) 5 MG tablet, Take 1 tablet (5 mg total) by mouth as needed., Disp: 30 tablet, Rfl: 0 .  Elastic Bandages & Supports (TENNIS ELBOW NEOPRENE BRACE) MISC, 1 Units by Does not apply route daily., Disp: 1 each, Rfl: 0 .  KLOR-CON M20 20 MEQ tablet, Take 1 tablet (20 mEq total) by mouth daily., Disp: 90 tablet, Rfl: 0 .  loratadine (CLARITIN) 10 MG tablet, Take 1 tablet by mouth as needed., Disp: , Rfl:  .  metFORMIN (GLUCOPHAGE) 500 MG  tablet, Take 1 tablet (500 mg total) by mouth 2 (two) times daily with a meal., Disp: 60 tablet, Rfl: 3 .  metoprolol succinate (TOPROL-XL) 25 MG 24 hr tablet, Take 1 tablet (25 mg total) by mouth daily., Disp: 30 tablet, Rfl: 3 .  predniSONE (DELTASONE) 10 MG tablet, Take 1 tablet (10 mg total) by mouth 2 (two) times daily with a meal., Disp: 10 tablet, Rfl: 0 .  SPIRIVA HANDIHALER 18 MCG inhalation capsule, as needed., Disp: , Rfl: 3 .  torsemide (DEMADEX) 20 MG tablet, TAKE 1 TABLET BY MOUTH DAILY, Disp: 30 tablet, Rfl: 3 .  traZODone (DESYREL) 50 MG tablet, Take 1 tablet by mouth as needed., Disp: , Rfl:  .  Vitamin D, Ergocalciferol, (DRISDOL) 50000 UNITS CAPS capsule, Take 1 capsule (50,000 Units total) by mouth every 7 (seven) days., Disp: 12 capsule, Rfl: 1 .  warfarin (COUMADIN) 5 MG tablet, 5 tablets daily., Disp: , Rfl: 5  Allergies  Allergen Reactions  . Amoxicillin-Pot Clavulanate Nausea And Vomiting and Other (See Comments)  . Codeine   . Penicillins Nausea Only  . Rosuvastatin   . Tetanus-Diphtheria Toxoids Td      ROS  Constitutional: Negative for fever , positive for  weight change - gained 4 lbs since last visit, but she states she has been eating more.  Respiratory: Positive  for cough but no  shortness of breath.   Cardiovascular: Negative for chest pain ( except for some soreness when coughing )  or palpitations.  Gastrointestinal: Negative for abdominal pain, no bowel changes.  Musculoskeletal: Negative for gait problem or joint swelling.  Skin: Negative for rash.  Neurological: Negative for dizziness or headache.  No other specific complaints in a complete review of systems (except as listed in HPI above).   Objective  Filed Vitals:   07/04/15 1117  BP: 122/68  Pulse: 86  Temp: 97.6 F (36.4 C)  TempSrc: Oral  Resp: 16  Height: 5\' 2"  (1.575 m)  Weight: 178 lb 4.8 oz (80.876 kg)  SpO2: 96%    Body mass index is 32.6 kg/(m^2).  Physical  Exam  Constitutional: Patient appears well-developed and well-nourished. Obese No distress.  HEENT: head atraumatic, normocephalic, pupils equal and reactive to light,  neck supple, throat within normal limits, no post-nasal drainage, boggy turbinates.  Cardiovascular: Normal rate, irregular rhythm, systolic murmur 3/5 on 2nd intercostal spaces bilaterally, louder on left border. No BLE edema. Pulmonary/Chest: Effort normal and breath sounds normal. No respiratory distress. Abdominal: Soft.  There is no tenderness. Psychiatric: Patient has a normal mood and affect. behavior is normal. Judgment and thought content normal.  Recent Results (from the past 2160 hour(s))  Lipid panel     Status: Abnormal   Collection Time: 06/25/15  8:40 AM  Result Value Ref Range  Cholesterol, Total 203 (H) 100 - 199 mg/dL   Triglycerides 127 0 - 149 mg/dL   HDL 49 >39 mg/dL    Comment: According to ATP-III Guidelines, HDL-C >59 mg/dL is considered a negative risk factor for CHD.    VLDL Cholesterol Cal 25 5 - 40 mg/dL   LDL Calculated 129 (H) 0 - 99 mg/dL   Chol/HDL Ratio 4.1 0.0 - 4.4 ratio units    Comment:                                   T. Chol/HDL Ratio                                             Men  Women                               1/2 Avg.Risk  3.4    3.3                                   Avg.Risk  5.0    4.4                                2X Avg.Risk  9.6    7.1                                3X Avg.Risk 23.4   11.0   Comprehensive metabolic panel     Status: Abnormal   Collection Time: 06/25/15  8:40 AM  Result Value Ref Range   Glucose 106 (H) 65 - 99 mg/dL   BUN 18 8 - 27 mg/dL   Creatinine, Ser 0.79 0.57 - 1.00 mg/dL   GFR calc non Af Amer 75 >59 mL/min/1.73   GFR calc Af Amer 86 >59 mL/min/1.73   BUN/Creatinine Ratio 23 11 - 26   Sodium 142 136 - 144 mmol/L    Comment:               **Please note reference interval change**   Potassium 4.2 3.5 - 5.2 mmol/L    Comment:                **Please note reference interval change**   Chloride 102 97 - 106 mmol/L    Comment:               **Please note reference interval change**   CO2 26 18 - 29 mmol/L   Calcium 9.5 8.7 - 10.3 mg/dL   Total Protein 6.9 6.0 - 8.5 g/dL   Albumin 4.4 3.5 - 4.8 g/dL   Globulin, Total 2.5 1.5 - 4.5 g/dL   Albumin/Globulin Ratio 1.8 1.1 - 2.5   Bilirubin Total 0.6 0.0 - 1.2 mg/dL   Alkaline Phosphatase 91 39 - 117 IU/L   AST 31 0 - 40 IU/L   ALT 26 0 - 32 IU/L  Uric acid     Status: None   Collection Time: 06/25/15  8:40 AM  Result Value Ref Range   Uric Acid 4.0 2.5 - 7.1  mg/dL    Comment:            Therapeutic target for gout patients: <6.0     PHQ2/9: Depression screen [facility] 2/9 06/06/2015 02/09/2015  Decreased Interest 0 0  Down, Depressed, Hopeless 0 2  PHQ - 2 Score 0 2  Altered sleeping - 0  Tired, decreased energy - 0  Change in appetite - 1  Feeling bad or failure about yourself  - 1  Trouble concentrating - 0  Moving slowly or fidgety/restless - 0  Suicidal thoughts - 0  PHQ-9 Score - 4  Difficult doing work/chores - Somewhat difficult     Fall Risk: Fall Risk  06/06/2015 02/09/2015  Falls in the past year? No No      Assessment & Plan  1. Chronic obstructive pulmonary disease with acute lower respiratory infection ([place])  She has a personal history of CHF and afib, but denies PND or orthopnea, no SOB. No edema. She gained 4 lbs since last visit but does not seem to be secondary to CHF. Symptoms more compatible with COPD flare, we will treat with Prednisone and cough suppressant and return if no improvement. Also needs to resume Spiriva  - predniSONE (DELTASONE) 10 MG tablet; Take 1 tablet (10 mg total) by mouth 2 (two) times daily with a meal.  Dispense: 10 tablet; Refill: 0 - chlorpheniramine-HYDROcodone (TUSSIONEX PENNKINETIC ER) 10-8 MG/5ML SUER; Take 5 mLs by mouth every 12 (twelve) hours as needed for cough.  Dispense: 140 mL; Refill: 0\  2.  Dyslipidemia  Discussed importance of taking statin daily, she has been compliant for the past couple of weeks

## 2015-07-10 ENCOUNTER — Telehealth: Payer: Self-pay | Admitting: Family Medicine

## 2015-07-10 NOTE — Telephone Encounter (Signed)
Pt needs work note to cover the day she came in sick on 07/04/15 through 07/09/15. Pt didn't go back to work on 07/10/15. Please fax to [phone].ATTN Rhonda Small

## 2015-07-11 NOTE — Telephone Encounter (Signed)
Done faxed to her work.

## 2015-07-17 ENCOUNTER — Emergency Department: Payer: Medicare Other

## 2015-07-17 ENCOUNTER — Encounter: Payer: Self-pay | Admitting: *Deleted

## 2015-07-17 ENCOUNTER — Ambulatory Visit (INDEPENDENT_AMBULATORY_CARE_PROVIDER_SITE_OTHER): Payer: Medicare Other | Admitting: Family Medicine

## 2015-07-17 ENCOUNTER — Emergency Department
Admission: EM | Admit: 2015-07-17 | Discharge: 2015-07-17 | Disposition: A | Discharge status: Home / Self Care | Payer: Medicare Other | Attending: Emergency Medicine | Admitting: Emergency Medicine

## 2015-07-17 ENCOUNTER — Encounter: Payer: Self-pay | Admitting: Family Medicine

## 2015-07-17 VITALS — BP 138/86 | HR 100 | Temp 97.9°F | Resp 16 | Ht 62.0 in | Wt 179.4 lb

## 2015-07-17 DIAGNOSIS — R0789 Other chest pain: Secondary | ICD-10-CM

## 2015-07-17 DIAGNOSIS — Z87891 Personal history of nicotine dependence: Secondary | ICD-10-CM | POA: Insufficient documentation

## 2015-07-17 DIAGNOSIS — R000 Tachycardia, unspecified: Secondary | ICD-10-CM | POA: Diagnosis not present

## 2015-07-17 DIAGNOSIS — R079 Chest pain, unspecified: Secondary | ICD-10-CM | POA: Diagnosis not present

## 2015-07-17 DIAGNOSIS — Z880 Allergy status to penicillin: Secondary | ICD-10-CM | POA: Diagnosis not present

## 2015-07-17 DIAGNOSIS — R002 Palpitations: Secondary | ICD-10-CM | POA: Diagnosis present

## 2015-07-17 DIAGNOSIS — I4891 Unspecified atrial fibrillation: Secondary | ICD-10-CM

## 2015-07-17 DIAGNOSIS — I10 Essential (primary) hypertension: Secondary | ICD-10-CM | POA: Insufficient documentation

## 2015-07-17 LAB — CBC
HCT: 40.3 % (ref 35.0–47.0)
Hemoglobin: 13.1 g/dL (ref 12.0–16.0)
MCH: 28.8 pg (ref 26.0–34.0)
MCHC: 32.6 g/dL (ref 32.0–36.0)
MCV: 88.5 fL (ref 80.0–100.0)
Platelets: 230 10*3/uL (ref 150–440)
RBC: 4.55 MIL/uL (ref 3.80–5.20)
RDW: 15.6 % — AB (ref 11.5–14.5)
WBC: 9 10*3/uL (ref 3.6–11.0)

## 2015-07-17 LAB — COMPREHENSIVE METABOLIC PANEL
ALBUMIN: 4.3 g/dL (ref 3.5–5.0)
ALT: 77 U/L — ABNORMAL HIGH (ref 14–54)
ANION GAP: 7 (ref 5–15)
AST: 46 U/L — AB (ref 15–41)
Alkaline Phosphatase: 96 U/L (ref 38–126)
BUN: 22 mg/dL — ABNORMAL HIGH (ref 6–20)
CHLORIDE: 104 mmol/L (ref 101–111)
CO2: 29 mmol/L (ref 22–32)
CREATININE: 0.9 mg/dL (ref 0.44–1.00)
Calcium: 9.2 mg/dL (ref 8.9–10.3)
GFR calc Af Amer: >60 mL/min (ref >60)
Glucose, Bld: 101 mg/dL — ABNORMAL HIGH (ref 65–99)
POTASSIUM: 3.2 mmol/L — AB (ref 3.5–5.1)
Sodium: 140 mmol/L (ref 135–145)
Total Bilirubin: 1.4 mg/dL — ABNORMAL HIGH (ref 0.3–1.2)
Total Protein: 7.2 g/dL (ref 6.5–8.1)

## 2015-07-17 LAB — PROTIME-INR
INR: 2.03
PROTHROMBIN TIME: 23.1 s — AB (ref 11.4–15.0)

## 2015-07-17 LAB — TROPONIN I
TROPONIN I: 0.05 ng/mL — AB (ref <0.031)
Troponin I: 0.05 ng/mL — ABNORMAL HIGH (ref <0.031)

## 2015-07-17 MED ORDER — SODIUM CHLORIDE 0.9 % IV SOLN
Freq: Once | INTRAVENOUS | Status: AC
  Administered 2015-07-17: 15:00:00 via INTRAVENOUS

## 2015-07-17 MED ORDER — METOPROLOL TARTRATE 1 MG/ML IV SOLN
INTRAVENOUS | Status: AC
  Filled 2015-07-17: qty 5

## 2015-07-17 MED ORDER — METOPROLOL TARTRATE 25 MG PO TABS: 25.0000 mg | ORAL_TABLET | Freq: Two times a day (BID) | ORAL | Status: DC

## 2015-07-17 MED ORDER — METOPROLOL TARTRATE 1 MG/ML IV SOLN
5.0000 mg | Freq: Once | INTRAVENOUS | Status: AC
  Administered 2015-07-17: 5 mg via INTRAVENOUS

## 2015-07-17 NOTE — ED Notes (Signed)
Pt denies CP

## 2015-07-17 NOTE — Progress Notes (Signed)
Name: [name]   MRN: [id number]    DOB: [birth year]   Date:07/17/2015       Progress Note  Subjective  Chief Complaint  Chief Complaint  Patient presents with  . Chest Pain    Onset-After finished Prednisone and fluid medication from last visit, short winded, having Heart Palpations just finished Prednisone  . Excessive Sweating  . Spasms    In bilateral legs    HPI  Rapid response afib: she states she has been having chest discomfort, decrease in exercise tolerance, emotional, intermittent palpitation, feeling anxious, no increase in orthopnea ( uses 2 pillows). She states she two torsemide last week because she was feeling bloated, and developed symptoms after that, with cramping that started one week ago, and the above symptoms. She is on Toprol XL and coumadin. Dr. Clayborn Bigness is her cardiologist but could not see her today  COPD exacerbation: she was given prednisone and cough medication and symptoms have resolved last dose of medication for COPD was last week.   Patient Active Problem List   Diagnosis Date Noted  . Chronic obstructive pulmonary disease ([place]) 07/04/2015  . Controlled gout 06/06/2015  . Anxiety 02/09/2015  . Synovial cyst of popliteal space 02/09/2015  . Benign hypertension 02/09/2015  . Blood type A+ 02/09/2015  . Arterial branch occlusion of retina 02/09/2015  . Chronic constipation 02/09/2015  . Insomnia, persistent 02/09/2015  . Chronic combined systolic and diastolic CHF, NYHA class 1 ([place]) 02/09/2015  . CAFL (chronic airflow limitation) ([place]) 02/09/2015  . Compromised kidney function 02/09/2015  . Dyslipidemia 02/09/2015  . Atrial fibrillation ([place]) 02/09/2015  . Arthritis urica 02/09/2015  . Hearing loss 02/09/2015  . History of open heart surgery 02/09/2015  . Chronic hoarseness 02/09/2015  . Cardiomegaly 02/09/2015  . Dysmetabolic syndrome 15/17/6160  . Migraine with aura and without status migrainosus, not intractable 02/09/2015  . Adult  BMI 30+ 02/09/2015  . Hypertensive pulmonary vascular disease ([place]) 02/09/2015  . Allergic rhinitis, seasonal 02/09/2015  . Vitamin D deficiency 02/09/2015  . History of mitral valve repair 10/01/2012  . History of aortic valve replacement 10/01/2012  . A-fib ([place]) 10/01/2012    Past Surgical History  Procedure Laterality Date  . Abdominal hysterectomy  1996  . Cardiac valve replacement  2014    Family History  Problem Relation Age of Onset  . Cancer Mother   . Diabetes Mother   . Hypertension Father   . Hypertension Mother   . Heart failure Father   . Cancer Brother     bladder  . Cancer Sister     breast  . Hypertension Son   . Hypertension Daughter     Social History   Social History  . Marital Status: Widowed    Spouse Name: N/A  . Number of Children: 2  . Years of Education: N/A   Occupational History  . Custodian    Social History Main Topics  . Smoking status: Former Smoker    Quit date: 09/09/2007  . Smokeless tobacco: Never Used  . Alcohol Use: No  . Drug Use: No  . Sexual Activity: Not Currently   Other Topics Concern  . Not on file   Social History Narrative     Current outpatient prescriptions:  .  aspirin EC 81 MG tablet, Take 1 tablet by mouth daily., Disp: , Rfl:  .  atorvastatin (LIPITOR) 80 MG tablet, Take 1 tablet (80 mg total) by mouth daily., Disp: 30 tablet, Rfl: 3 .  colchicine 0.6 MG tablet, 1 tablet as needed., Disp: , Rfl:  .  diazepam (VALIUM) 5 MG tablet, Take 1 tablet (5 mg total) by mouth as needed., Disp: 30 tablet, Rfl: 0 .  Elastic Bandages & Supports (TENNIS ELBOW NEOPRENE BRACE) MISC, 1 Units by Does not apply route daily., Disp: 1 each, Rfl: 0 .  KLOR-CON M20 20 MEQ tablet, Take 1 tablet (20 mEq total) by mouth daily., Disp: 90 tablet, Rfl: 0 .  metFORMIN (GLUCOPHAGE) 500 MG tablet, Take 1 tablet (500 mg total) by mouth 2 (two) times daily with a meal., Disp: 60 tablet, Rfl: 3 .  metoprolol succinate (TOPROL-XL) 25 MG  24 hr tablet, Take 1 tablet (25 mg total) by mouth daily., Disp: 30 tablet, Rfl: 3 .  SPIRIVA HANDIHALER 18 MCG inhalation capsule, as needed., Disp: , Rfl: 3 .  torsemide (DEMADEX) 20 MG tablet, TAKE 1 TABLET BY MOUTH DAILY, Disp: 30 tablet, Rfl: 3 .  traZODone (DESYREL) 50 MG tablet, Take 1 tablet by mouth as needed., Disp: , Rfl:  .  Vitamin D, Ergocalciferol, (DRISDOL) 50000 UNITS CAPS capsule, Take 1 capsule (50,000 Units total) by mouth every 7 (seven) days., Disp: 12 capsule, Rfl: 1 .  warfarin (COUMADIN) 5 MG tablet, 5 tablets daily., Disp: , Rfl: 5 .  allopurinol (ZYLOPRIM) 300 MG tablet, TAKE 1 TABLET BY MOUTH DAILY (Patient not taking: Reported on 07/17/2015), Disp: 30 tablet, Rfl: 3 .  loratadine (CLARITIN) 10 MG tablet, Take 1 tablet by mouth as needed., Disp: , Rfl:   Allergies  Allergen Reactions  . Amoxicillin-Pot Clavulanate Nausea And Vomiting and Other (See Comments)  . Codeine   . Penicillins Nausea Only  . Rosuvastatin   . Tetanus-Diphtheria Toxoids Td      ROS  Constitutional: Negative for fever or weight change.  Respiratory: Negative for cough, positive for  shortness of breath.   Cardiovascular: Positive  for chest discomfort and  palpitations.  Gastrointestinal: Negative for abdominal pain, no bowel changes.  Musculoskeletal: Negative for gait problem or joint swelling.  Skin: Negative for rash.  Neurological: Negative for dizziness or headache.  No other specific complaints in a complete review of systems (except as listed in HPI above).  Objective  Filed Vitals:   07/17/15 1410  BP: 138/86  Pulse: 100  Temp: 97.9 F (36.6 C)  TempSrc: Oral  Resp: 16  Height: 5\' 2"  (1.575 m)  Weight: 179 lb 6.4 oz (81.375 kg)  SpO2: 95%    Body mass index is 32.8 kg/(m^2).  Physical Exam  Constitutional: Patient appears well-developed and well-nourished. Obese No distress.  HEENT: head atraumatic, normocephalic, pupils equal and reactive to light, neck  supple, throat within normal limits, no post-nasal drainage, boggy turbinates.  Cardiovascular: Fast rate, irregular rhythm, systolic murmur 3/5 on 2nd intercostal spaces bilaterally, louder on left border. No BLE edema. Pulmonary/Chest: Effort normal and breath sounds normal. No respiratory distress. Abdominal: Soft. There is no tenderness. Psychiatric: Patient has a normal mood and affect. behavior is normal. Judgment and thought content normal.  Recent Results (from the past 2160 hour(s))  Lipid panel     Status: Abnormal   Collection Time: 06/25/15  8:40 AM  Result Value Ref Range   Cholesterol, Total 203 (H) 100 - 199 mg/dL   Triglycerides 127 0 - 149 mg/dL   HDL 49 >39 mg/dL    Comment: According to ATP-III Guidelines, HDL-C >59 mg/dL is considered a negative risk factor for CHD.    VLDL  Cholesterol Cal 25 5 - 40 mg/dL   LDL Calculated 129 (H) 0 - 99 mg/dL   Chol/HDL Ratio 4.1 0.0 - 4.4 ratio units    Comment:                                   T. Chol/HDL Ratio                                             Men  Women                               1/2 Avg.Risk  3.4    3.3                                   Avg.Risk  5.0    4.4                                2X Avg.Risk  9.6    7.1                                3X Avg.Risk 23.4   11.0   Comprehensive metabolic panel     Status: Abnormal   Collection Time: 06/25/15  8:40 AM  Result Value Ref Range   Glucose 106 (H) 65 - 99 mg/dL   BUN 18 8 - 27 mg/dL   Creatinine, Ser 0.79 0.57 - 1.00 mg/dL   GFR calc non Af Amer 75 >59 mL/min/1.73   GFR calc Af Amer 86 >59 mL/min/1.73   BUN/Creatinine Ratio 23 11 - 26   Sodium 142 136 - 144 mmol/L    Comment:               **Please note reference interval change**   Potassium 4.2 3.5 - 5.2 mmol/L    Comment:               **Please note reference interval change**   Chloride 102 97 - 106 mmol/L    Comment:               **Please note reference interval change**   CO2 26 18 - 29 mmol/L    Calcium 9.5 8.7 - 10.3 mg/dL   Total Protein 6.9 6.0 - 8.5 g/dL   Albumin 4.4 3.5 - 4.8 g/dL   Globulin, Total 2.5 1.5 - 4.5 g/dL   Albumin/Globulin Ratio 1.8 1.1 - 2.5   Bilirubin Total 0.6 0.0 - 1.2 mg/dL   Alkaline Phosphatase 91 39 - 117 IU/L   AST 31 0 - 40 IU/L   ALT 26 0 - 32 IU/L  Uric acid     Status: None   Collection Time: 06/25/15  8:40 AM  Result Value Ref Range   Uric Acid 4.0 2.5 - 7.1 mg/dL    Comment:            Therapeutic target for gout patients: <6.0    PHQ2/9: Depression screen [facility] 2/9 06/06/2015 02/09/2015  Decreased Interest 0 0  Down, Depressed, Hopeless 0 2  PHQ -  2 Score 0 2  Altered sleeping - 0  Tired, decreased energy - 0  Change in appetite - 1  Feeling bad or failure about yourself  - 1  Trouble concentrating - 0  Moving slowly or fidgety/restless - 0  Suicidal thoughts - 0  PHQ-9 Score - 4  Difficult doing work/chores - Somewhat difficult     Fall Risk: Fall Risk  06/06/2015 02/09/2015  Falls in the past year? No No     Assessment & Plan  1. Chest discomfort  - EKG 12-Lead  2. Atrial fibrillation with rapid ventricular response (HCC)  Rapid response, contact Dr. Clayborn Bigness and she cannot be seen there today, EKG HR of 139. Contacted EMS and she will be transported by EMS

## 2015-07-17 NOTE — ED Provider Notes (Signed)
[facility] Emergency Department Provider Note     Time seen: ----------------------------------------- 3:24 PM on 07/17/2015 -----------------------------------------    I have reviewed the triage vital signs and the nursing notes.   HISTORY  Chief Complaint Atrial Fibrillation    HPI [name] is a 74 y.o. female who presents ER with complaints of heart palpitations. Patient was seen her primary care doctor's office and told she was in rapid A. fib. Patient does have a history of A. fib according to report but her heart rate has been markedly elevated today. According to report previously Hospital heart rate was up to 180 bpm. She denies any fevers, chills, chest pain, shortness of breath. Patient has not had a history of this significant tachycardia in the past.   Past Medical History  Diagnosis Date  . CHF (congestive heart failure) ([place])   . COPD (chronic obstructive pulmonary disease) ([place])   . Hyperlipidemia     Patient Active Problem List   Diagnosis Date Noted  . Chronic obstructive pulmonary disease ([place]) 07/04/2015  . Controlled gout 06/06/2015  . Anxiety 02/09/2015  . Synovial cyst of popliteal space 02/09/2015  . Benign hypertension 02/09/2015  . Blood type A+ 02/09/2015  . Arterial branch occlusion of retina 02/09/2015  . Chronic constipation 02/09/2015  . Insomnia, persistent 02/09/2015  . Chronic combined systolic and diastolic CHF, NYHA class 1 ([place]) 02/09/2015  . CAFL (chronic airflow limitation) ([place]) 02/09/2015  . Compromised kidney function 02/09/2015  . Dyslipidemia 02/09/2015  . Atrial fibrillation ([place]) 02/09/2015  . Arthritis urica 02/09/2015  . Hearing loss 02/09/2015  . History of open heart surgery 02/09/2015  . Chronic hoarseness 02/09/2015  . Cardiomegaly 02/09/2015  . Dysmetabolic syndrome 16/06/9603  . Migraine with aura and without status migrainosus, not intractable 02/09/2015  . Adult BMI 30+  02/09/2015  . Hypertensive pulmonary vascular disease ([place]) 02/09/2015  . Allergic rhinitis, seasonal 02/09/2015  . Vitamin D deficiency 02/09/2015  . History of mitral valve repair 10/01/2012  . History of aortic valve replacement 10/01/2012  . A-fib ([place]) 10/01/2012    Past Surgical History  Procedure Laterality Date  . Abdominal hysterectomy  1996  . Cardiac valve replacement  2014    Allergies Amoxicillin-pot clavulanate; Codeine; Penicillins; Rosuvastatin; and Tetanus-diphtheria toxoids td  Social History Social History  Substance Use Topics  . Smoking status: Former Smoker    Quit date: 09/09/2007  . Smokeless tobacco: Never Used  . Alcohol Use: No    Review of Systems Constitutional: Negative for fever. Eyes: Negative for visual changes. ENT: Negative for sore throat. Cardiovascular: Negative for chest pain. Positive for palpitations Respiratory: Negative for shortness of breath. Gastrointestinal: Negative for abdominal pain, vomiting and diarrhea. Genitourinary: Negative for dysuria. Musculoskeletal: Negative for back pain. Skin: Negative for rash. Neurological: Negative for headaches, focal weakness or numbness.  10-point ROS otherwise negative.  ____________________________________________   PHYSICAL EXAM:  VITAL SIGNS: ED Triage Vitals  Enc Vitals Group     BP 07/17/15 1507 147/122 mmHg     Pulse Rate 07/17/15 1507 118     Resp 07/17/15 1507 18     Temp 07/17/15 1507 98 F (36.7 C)     Temp Source 07/17/15 1507 Oral     SpO2 --      Weight 07/17/15 1507 179 lb (81.194 kg)     Height 07/17/15 1507 5\' 4"  (1.626 m)     Head Cir --      Peak Flow --  Pain Score 07/17/15 1506 0     Pain Loc --      Pain Edu? --      Excl. in [place]? --     Constitutional: Alert and oriented. Well appearing and in no distress. Eyes: Conjunctivae are normal. PERRL. Normal extraocular movements. ENT   Head: Normocephalic and atraumatic.   Nose: No  congestion/rhinnorhea.   Mouth/Throat: Mucous membranes are moist.   Neck: No stridor. Cardiovascular: Rapid Rate, irregular rhythm. Normal and symmetric distal pulses are present in all extremities.  Respiratory: Normal respiratory effort without tachypnea nor retractions. Breath sounds are clear and equal bilaterally. No wheezes/rales/rhonchi. Gastrointestinal: Soft and nontender. No distention. No abdominal bruits.  Musculoskeletal: Nontender with normal range of motion in all extremities. No joint effusions.  No lower extremity tenderness nor edema. Neurologic:  Normal speech and language. No gross focal neurologic deficits are appreciated. Speech is normal. No gait instability. Skin:  Skin is warm, dry and intact. No rash noted. Psychiatric: Mood and affect are normal. Speech and behavior are normal. Patient exhibits appropriate insight and judgment. ____________________________________________  EKG: Interpreted by me. A 2 fibrillation with a rapid ventricular response with a rate of 124 bpm, wide QRS with an left bundle branch block, normal QT interval. No evidence of acute infarction.  ____________________________________________  ED COURSE:  Pertinent labs & imaging results that were available during my care of the patient were reviewed by me and considered in my medical decision making (see chart for details). Patient with rapid A. fib, will receive IV beta blocker and we will check basic labs. ____________________________________________    LABS (pertinent positives/negatives)  Labs Reviewed  CBC - Abnormal; Notable for the following:    RDW 15.6 (*)    All other components within normal limits  TROPONIN I - Abnormal; Notable for the following:    Troponin I 0.05 (*)    All other components within normal limits  COMPREHENSIVE METABOLIC PANEL - Abnormal; Notable for the following:    Potassium 3.2 (*)    Glucose, Bld 101 (*)    BUN 22 (*)    AST 46 (*)    ALT 77  (*)    Total Bilirubin 1.4 (*)    All other components within normal limits  PROTIME-INR - Abnormal; Notable for the following:    Prothrombin Time 23.1 (*)    All other components within normal limits  TROPONIN I - Abnormal; Notable for the following:    Troponin I 0.05 (*)    All other components within normal limits    RADIOLOGY  Chest x-ray IMPRESSION: Stable cardiomegaly. No acute cardiopulmonary abnormality. ____________________________________________  FINAL ASSESSMENT AND PLAN  Rapid atrial fibrillation  Plan: Patient with labs and imaging as dictated above. Patient with rapid A. fib treated with IV Lopressor. This resulted in good rate control her heart rate around 90, blood pressure remains stable this time. We will repeat troponin to see if it is increasing.   Earleen Newport, MD Elevated troponin appeared to be demand related, it is not increasing. She'll be discharged with Toprol twice daily, she can continue Coumadin as written. She will have close follow-up with cardiology  Earleen Newport, MD 07/17/15 [phone]

## 2015-07-17 NOTE — ED Notes (Signed)
Pt arrives with complaints of palpatations, pt was seen at PCP and told she was in AFIB RVR, EMS reports HR in the 90s-180s, pt awake and alert in no distress, states no pain

## 2015-07-17 NOTE — Discharge Instructions (Signed)

## 2015-07-18 ENCOUNTER — Other Ambulatory Visit: Payer: Self-pay

## 2015-07-18 ENCOUNTER — Emergency Department
Admission: EM | Admit: 2015-07-18 | Discharge: 2015-07-18 | Disposition: A | Discharge status: Home / Self Care | Payer: Medicare Other | Attending: Emergency Medicine | Admitting: Emergency Medicine

## 2015-07-18 DIAGNOSIS — Z79899 Other long term (current) drug therapy: Secondary | ICD-10-CM | POA: Insufficient documentation

## 2015-07-18 DIAGNOSIS — R000 Tachycardia, unspecified: Secondary | ICD-10-CM | POA: Diagnosis present

## 2015-07-18 DIAGNOSIS — I5042 Chronic combined systolic (congestive) and diastolic (congestive) heart failure: Secondary | ICD-10-CM | POA: Diagnosis not present

## 2015-07-18 DIAGNOSIS — I10 Essential (primary) hypertension: Secondary | ICD-10-CM | POA: Insufficient documentation

## 2015-07-18 DIAGNOSIS — Z7901 Long term (current) use of anticoagulants: Secondary | ICD-10-CM | POA: Diagnosis not present

## 2015-07-18 DIAGNOSIS — Z7984 Long term (current) use of oral hypoglycemic drugs: Secondary | ICD-10-CM | POA: Diagnosis not present

## 2015-07-18 DIAGNOSIS — I4892 Unspecified atrial flutter: Secondary | ICD-10-CM | POA: Insufficient documentation

## 2015-07-18 DIAGNOSIS — Z880 Allergy status to penicillin: Secondary | ICD-10-CM | POA: Diagnosis not present

## 2015-07-18 DIAGNOSIS — Z7982 Long term (current) use of aspirin: Secondary | ICD-10-CM | POA: Insufficient documentation

## 2015-07-18 DIAGNOSIS — Z87891 Personal history of nicotine dependence: Secondary | ICD-10-CM | POA: Diagnosis not present

## 2015-07-18 LAB — COMPREHENSIVE METABOLIC PANEL
ALBUMIN: 4 g/dL (ref 3.5–5.0)
ALT: 113 U/L — AB (ref 14–54)
AST: 90 U/L — AB (ref 15–41)
Alkaline Phosphatase: 104 U/L (ref 38–126)
Anion gap: 7 (ref 5–15)
BUN: 26 mg/dL — ABNORMAL HIGH (ref 6–20)
CHLORIDE: 107 mmol/L (ref 101–111)
CO2: 25 mmol/L (ref 22–32)
CREATININE: 0.9 mg/dL (ref 0.44–1.00)
Calcium: 9.4 mg/dL (ref 8.9–10.3)
GFR calc non Af Amer: >60 mL/min (ref >60)
Glucose, Bld: 98 mg/dL (ref 65–99)
Potassium: 3.6 mmol/L (ref 3.5–5.1)
SODIUM: 139 mmol/L (ref 135–145)
Total Bilirubin: 0.7 mg/dL (ref 0.3–1.2)
Total Protein: 7.1 g/dL (ref 6.5–8.1)

## 2015-07-18 LAB — CBC
HCT: 37.9 % (ref 35.0–47.0)
Hemoglobin: 12.5 g/dL (ref 12.0–16.0)
MCH: 29.6 pg (ref 26.0–34.0)
MCHC: 33 g/dL (ref 32.0–36.0)
MCV: 89.5 fL (ref 80.0–100.0)
PLATELETS: 199 10*3/uL (ref 150–440)
RBC: 4.23 MIL/uL (ref 3.80–5.20)
RDW: 15.6 % — ABNORMAL HIGH (ref 11.5–14.5)
WBC: 8.1 10*3/uL (ref 3.6–11.0)

## 2015-07-18 LAB — TROPONIN I: Troponin I: 0.05 ng/mL — ABNORMAL HIGH (ref <0.031)

## 2015-07-18 MED ORDER — DIGOXIN 0.25 MG/ML IJ SOLN
0.5000 mg | Freq: Once | INTRAMUSCULAR | Status: AC
  Administered 2015-07-18: 0.5 mg via INTRAVENOUS
  Filled 2015-07-18: qty 2

## 2015-07-18 MED ORDER — DIGOXIN 250 MCG PO TABS
250.0000 ug | ORAL_TABLET | ORAL | Status: DC
Start: 2015-07-18 — End: 2016-01-10

## 2015-07-18 NOTE — Discharge Instructions (Signed)
Follow-up with Dr. Clayborn Bigness tomorrow morning in his clinic. He will be expecting you, please call at 8:30 in the morning to affirm a time.  Return emergency room right away if he develop weakness, racing of the heart again, you're short of breath, have chest pain or other new concerns arise.  Please fill and take your digoxin prescription as prescribed this evening.  Atrial Flutter Atrial flutter is a heart rhythm that can cause the heart to beat very fast (tachycardia). It originates in the upper chambers of the heart (atria). In atrial flutter, the top chambers of the heart (atria) often beat much faster than the bottom chambers of the heart (ventricles). Atrial flutter has a regular "saw toothed" appearance in an EKG readout. An EKG is a test that records the electrical activity of the heart. Atrial flutter can cause the heart to beat up to 150 beats per minute (BPM). Atrial flutter can either be short lived (paroxysmal) or permanent.  CAUSES  Causes of atrial flutter can be many. Some of these include:  Heart related issues:  Heart attack (myocardial infarction).  Heart failure.  Heart valve problems.  Poorly controlled high blood pressure (hypertension).  After open heart surgery.  Lung related issues:  A blood clot in the lungs (pulmonary embolism).  Chronic obstructive pulmonary disease (COPD). Medications used to treat COPD can attribute to atrial flutter.  Other related causes:  Hyperthyroidism.  Caffeine.  Some decongestant cold medications.  Low electrolyte levels such as potassium or magnesium.  Cocaine. SYMPTOMS  An awareness of your heart beating rapidly (palpitations).  Shortness of breath.  Chest pain.  Low blood pressure (hypotension).  Dizziness or fainting. DIAGNOSIS  Different tests can be performed to diagnose atrial flutter.   An EKG.  Holter monitor. This is a 24-hour recording of your heart rhythm. You will also be given a diary. Write  down all symptoms that you have and what you were doing at the time you experienced symptoms.  Cardiac event monitor. This small device can be worn for up to 30 days. When you have heart symptoms, you will push a button on the device. This will then record your heart rhythm.  Echocardiogram. This is an imaging test to look at your heart. Your caregiver will look at your heart valves and the ventricles.  Stress test. This test can help determine if the atrial flutter is related to exercise or if coronary artery disease is present.  Laboratory studies will look at certain blood levels like:  Complete blood count (CBC).  Potassium.  Magnesium.  Thyroid function. TREATMENT  Treatment of atrial flutter varies. A combination of therapies may be used or sometimes atrial flutter may need only 1 type of treatment.  Lab work: If your blood work, such as your electrolytes (potassium, magnesium) or your thyroid function tests, are abnormal, your caregiver will treat them accordingly.  Medication:  There are several different types of medications that can convert your heart to a normal rhythm and prevent atrial flutter from reoccurring.  Nonsurgical procedures: Nonsurgical techniques may be used to control atrial flutter. Some examples include:  Cardioversion. This technique uses either drugs or an electrical shock to restore a normal heart rhythm:  Cardioversion drugs may be given through an intravenous (IV) line to help "reset" the heart rhythm.  In electrical cardioversion, your caregiver shocks your heart with electrical energy. This helps to reset the heartbeat to a normal rhythm.  Ablation. If atrial flutter is a persistent problem, an ablation  may be needed. This procedure is done under mild sedation. High frequency radio-wave energy is used to destroy the area of heart tissue responsible for atrial flutter. SEEK IMMEDIATE MEDICAL CARE IF:  You have:  Dizziness.  Near fainting or  fainting.  Shortness of breath.  Chest pain or pressure.  Sudden nausea or vomiting.  Profuse sweating. If you have the above symptoms, call your local emergency service immediately! Do not drive yourself to the hospital. MAKE SURE YOU:   Understand these instructions.  Will watch your condition.  Will get help right away if you are not doing well or get worse.   This information is not intended to replace advice given to you by your health care provider. Make sure you discuss any questions you have with your health care provider.   Document Released: 01/11/2009 Document Revised: 09/15/2014 Document Reviewed: 03/09/2015 Elsevier Interactive Patient Education [employer].

## 2015-07-18 NOTE — ED Notes (Signed)
Patient comes back today with rapid heart rate that started today at 9am.

## 2015-07-18 NOTE — ED Notes (Signed)
Was seen yesterday for heart racing..states  The same thing has happened again today off and on

## 2015-07-18 NOTE — ED Provider Notes (Signed)
Patient's heart rate and blood pressure have remained stable we will discharge her as planned  Nena Polio, MD 07/18/15 2146

## 2015-07-18 NOTE — ED Notes (Signed)
Pt states understanding of follow-up appointment with Dr. Clayborn Bigness on 07/19/15. Pt states understanding of prescription medication. Pt was given opportunity to ask questions and verbalized no further questions. Pt vitals signs WNL upon D/C. Pt daughter with her upon D/C.

## 2015-07-18 NOTE — ED Notes (Signed)
MD Quale at bedside. 

## 2015-07-18 NOTE — ED Provider Notes (Signed)
[facility] Emergency Department Provider Note  REMINDER - THIS NOTE IS NOT A FINAL MEDICAL RECORD UNTIL IT IS SIGNED. UNTIL THEN, THE CONTENT BELOW MAY REFLECT INFORMATION FROM A DOCUMENTATION TEMPLATE, NOT THE ACTUAL PATIENT VISIT. ____________________________________________  Time seen: Approximately 7:24 PM  I have reviewed the triage vital signs and the nursing notes.   HISTORY  Chief Complaint Tachycardia    HPI [name] is a 74 y.o. female history of COPD and congestive heart failure, also valve replacement on Coumadin and atrial fibrillation.  Patient has chronic atrial fibrillation, she had an episode yesterday with heart rates in the 150-180 and was placed on metoprolol twice daily. She is continued on this but reports that today while at work her symptoms restarted, she can feel her heart pounding in her chest. She denies any chest pain or trouble breathing. She did have a cough last week and was treated with prednisone, but this is resolved. Presently denies any other concerns other than feeling her however is just being out of her chest. She is not lightheaded with it.  She has taken to total of 3x 25 mg metoprolol tablets today, but despite this continues to have palpitations   Past Medical History  Diagnosis Date  . CHF (congestive heart failure) ([place])   . COPD (chronic obstructive pulmonary disease) ([place])   . Hyperlipidemia     Patient Active Problem List   Diagnosis Date Noted  . Chronic obstructive pulmonary disease ([place]) 07/04/2015  . Controlled gout 06/06/2015  . Anxiety 02/09/2015  . Synovial cyst of popliteal space 02/09/2015  . Benign hypertension 02/09/2015  . Blood type A+ 02/09/2015  . Arterial branch occlusion of retina 02/09/2015  . Chronic constipation 02/09/2015  . Insomnia, persistent 02/09/2015  . Chronic combined systolic and diastolic CHF, NYHA class 1 ([place]) 02/09/2015  . CAFL (chronic airflow limitation)  ([place]) 02/09/2015  . Compromised kidney function 02/09/2015  . Dyslipidemia 02/09/2015  . Atrial fibrillation ([place]) 02/09/2015  . Arthritis urica 02/09/2015  . Hearing loss 02/09/2015  . History of open heart surgery 02/09/2015  . Chronic hoarseness 02/09/2015  . Cardiomegaly 02/09/2015  . Dysmetabolic syndrome 29/56/2130  . Migraine with aura and without status migrainosus, not intractable 02/09/2015  . Adult BMI 30+ 02/09/2015  . Hypertensive pulmonary vascular disease ([place]) 02/09/2015  . Allergic rhinitis, seasonal 02/09/2015  . Vitamin D deficiency 02/09/2015  . History of mitral valve repair 10/01/2012  . History of aortic valve replacement 10/01/2012  . A-fib ([place]) 10/01/2012    Past Surgical History  Procedure Laterality Date  . Abdominal hysterectomy  1996  . Cardiac valve replacement  2014    Current Outpatient Rx  Name  Route  Sig  Dispense  Refill  . acetaminophen (TYLENOL) 500 MG tablet   Oral   Take 1,000-1,500 mg by mouth every 6 (six) hours as needed for mild pain.         Marland Kitchen allopurinol (ZYLOPRIM) 300 MG tablet   Oral   Take 300 mg by mouth daily.         Marland Kitchen aspirin EC 81 MG tablet   Oral   Take 81 mg by mouth every evening.         Marland Kitchen atorvastatin (LIPITOR) 80 MG tablet   Oral   Take 1 tablet (80 mg total) by mouth daily.   30 tablet   3   . colchicine 0.6 MG tablet   Oral   Take 0.6-1.2 mg by mouth  as needed (for gout flares).         . diazepam (VALIUM) 5 MG tablet   Oral   Take 1 tablet (5 mg total) by mouth as needed. Patient taking differently: Take 5 mg by mouth as needed for anxiety.    30 tablet   0   . digoxin (LANOXIN) 0.25 MG tablet   Oral   Take 1 tablet (250 mcg total) by mouth every 4 (four) hours.   2 tablet   0   . KLOR-CON M20 20 MEQ tablet   Oral   Take 1 tablet (20 mEq total) by mouth daily.   90 tablet   0     Dispense as written.   . loratadine (CLARITIN) 10 MG tablet   Oral   Take 10 mg by mouth daily  as needed for allergies.         . metFORMIN (GLUCOPHAGE) 500 MG tablet   Oral   Take 1 tablet (500 mg total) by mouth 2 (two) times daily with a meal.   60 tablet   3   . metoprolol tartrate (LOPRESSOR) 25 MG tablet   Oral   Take 1 tablet (25 mg total) by mouth 2 (two) times daily.   60 tablet   1   . tiotropium (SPIRIVA) 18 MCG inhalation capsule   Inhalation   Place 18 mcg into inhaler and inhale daily as needed (for shortness of breath).         . torsemide (DEMADEX) 20 MG tablet   Oral   Take 20 mg by mouth daily.         . traZODone (DESYREL) 50 MG tablet   Oral   Take 50-100 mg by mouth at bedtime as needed for sleep.         . Vitamin D, Ergocalciferol, (DRISDOL) 50000 UNITS CAPS capsule   Oral   Take 1 capsule (50,000 Units total) by mouth every 7 (seven) days. Patient taking differently: Take 50,000 Units by mouth every 7 (seven) days. Pt takes on Tuesday.   12 capsule   1   . warfarin (COUMADIN) 5 MG tablet   Oral   Take 5-7.5 mg by mouth every evening. Pt takes one tablet Sunday, Monday, Wednesday, and Friday.  Pt takes one and one-half tablet on Tuesday, Thursday, and Saturday.           Allergies Amoxicillin-pot clavulanate; Codeine; Penicillins; Rosuvastatin; and Tetanus-diphtheria toxoids td  Family History  Problem Relation Age of Onset  . Cancer Mother   . Diabetes Mother   . Hypertension Father   . Hypertension Mother   . Heart failure Father   . Cancer Brother     bladder  . Cancer Sister     breast  . Hypertension Son   . Hypertension Daughter     Social History Social History  Substance Use Topics  . Smoking status: Former Smoker    Quit date: 09/09/2007  . Smokeless tobacco: Never Used  . Alcohol Use: No    Review of Systems Constitutional: No fever/chills Eyes: No visual changes. ENT: No sore throat. Cardiovascular: Denies chest pain. Respiratory: Denies shortness of breath. Gastrointestinal: No abdominal pain.   No nausea, no vomiting.  No diarrhea.  No constipation. Genitourinary: Negative for dysuria. Musculoskeletal: Negative for back pain. Skin: Negative for rash. Neurological: Negative for headaches, focal weakness or numbness.  10-point ROS otherwise negative.  ____________________________________________   PHYSICAL EXAM:  VITAL SIGNS: ED Triage Vitals  Enc  Vitals Group     BP 07/18/15 1906 127/90 mmHg     Pulse Rate 07/18/15 1906 110     Resp 07/18/15 1906 20     Temp 07/18/15 1906 97.9 F (36.6 C)     Temp Source 07/18/15 1906 Oral     SpO2 07/18/15 1906 96 %     Weight 07/18/15 1906 175 lb (79.379 kg)     Height 07/18/15 1906 5\' 4"  (1.626 m)     Head Cir --      Peak Flow --      Pain Score --      Pain Loc --      Pain Edu? --      Excl. in [place]? --    Constitutional: Alert and oriented. Well appearing and in no acute distress. Eyes: Conjunctivae are normal. PERRL. EOMI. Head: Atraumatic. Nose: No congestion/rhinnorhea. Mouth/Throat: Mucous membranes are moist.  Oropharynx non-erythematous. Neck: No stridor.   Cardiovascular: Irregularly irregular, tachycardic. Grossly normal heart sounds.  Good peripheral circulation. Respiratory: Normal respiratory effort.  No retractions. Lungs CTAB. Gastrointestinal: Soft and nontender. No distention. No abdominal bruits. No CVA tenderness. Musculoskeletal: No lower extremity tenderness nor edema.  No joint effusions. Neurologic:  Normal speech and language. No gross focal neurologic deficits are appreciated. Skin:  Skin is warm, dry and intact. No rash noted. Psychiatric: Mood and affect are normal. Speech and behavior are normal.  ____________________________________________   LABS (all labs ordered are listed, but only abnormal results are displayed)  Labs Reviewed  CBC - Abnormal; Notable for the following:    RDW 15.6 (*)    All other components within normal limits  COMPREHENSIVE METABOLIC PANEL - Abnormal; Notable for  the following:    BUN 26 (*)    AST 90 (*)    ALT 113 (*)    All other components within normal limits  TROPONIN I - Abnormal; Notable for the following:    Troponin I 0.05 (*)    All other components within normal limits   ____________________________________________  EKG  Reviewed and interpreted by me Ventricular rate 101 QRS 1:30 QTc 390 Apparent atrial flutter with variable conduction New T-wave inversions are noted in lateral precordial leads as compared with previous, otherwise no significant changes noted Cannot exclude ischemic change based on his EKG alone, will check a single troponin because of the worsening depressions noted in lateral precordial leads the patient does not endorse any chest pain or dyspnea at this time. ____________________________________________  RADIOLOGY  The patient was noted to have a chest x-ray done yesterday, she denies any respiratory symptoms. No acute cardiopulmonary process was seen on yesterday's film. ____________________________________________   PROCEDURES  Procedure(s) performed: None  Critical Care performed: No  ____________________________________________   INITIAL IMPRESSION / ASSESSMENT AND PLAN / ED COURSE  Pertinent labs & imaging results that were available during my care of the patient were reviewed by me and considered in my medical decision making (see chart for details).  Patient presents with palpitations, again noted to have atrial flutter with rapid ventricular response. She is on metoprolol twice a day but no other antiarrhythmics. I discussed case and care with Dr. Lujean Amel her cardiologist who recommends that we start her on digoxin 0.5 mg IV loading in the ER, and if heart rate is improved after that in less than 120s consistently he would advise discharging her to follow up in his clinic tomorrow morning. The patient is agreeable with this plan, Dr. Clay[place] Bigness  expressive he will see her tomorrow  morning. She hemodynamically is stable at this time, and I will initiate digoxin loading here in the ER and continue her with 2 doses at home and follow up tomorrow.  Careful return precautions discussed the patient is agreeable with the plan.  ----------------------------------------- 8:25 PM on 07/18/2015 -----------------------------------------  The patient's heart rate is improved to the 70s, normotensive. Palpitations and symptoms resolved. We will continue to watch her for approximately 30 minutes, she remained stable with rate control I'll discharge her to home with 2 small doses of digoxin and follow-up tomorrow. Patient very agreeable.  Plan of care assigned to Dr. Rip Harbour at 9 PM, we'll discharge the patient to home to follow-up tomorrow if she remains hemodynamically stable without significant concern after an additional 30 minutes observation after digoxin loading. ____________________________________________   FINAL CLINICAL IMPRESSION(S) / ED DIAGNOSES  Final diagnoses:  Atrial flutter with rapid ventricular response (HCC)      Delman Kitten, MD 07/18/15 2054

## 2015-07-19 DIAGNOSIS — E784 Other hyperlipidemia: Secondary | ICD-10-CM | POA: Diagnosis not present

## 2015-07-19 DIAGNOSIS — Z952 Presence of prosthetic heart valve: Secondary | ICD-10-CM | POA: Diagnosis not present

## 2015-07-19 DIAGNOSIS — Z9889 Other specified postprocedural states: Secondary | ICD-10-CM | POA: Diagnosis not present

## 2015-07-19 DIAGNOSIS — E669 Obesity, unspecified: Secondary | ICD-10-CM | POA: Diagnosis not present

## 2015-07-19 DIAGNOSIS — R000 Tachycardia, unspecified: Secondary | ICD-10-CM | POA: Diagnosis not present

## 2015-07-19 DIAGNOSIS — I480 Paroxysmal atrial fibrillation: Secondary | ICD-10-CM | POA: Diagnosis not present

## 2015-07-19 DIAGNOSIS — R0602 Shortness of breath: Secondary | ICD-10-CM | POA: Diagnosis not present

## 2015-07-19 DIAGNOSIS — R011 Cardiac murmur, unspecified: Secondary | ICD-10-CM | POA: Diagnosis not present

## 2015-07-19 DIAGNOSIS — J45909 Unspecified asthma, uncomplicated: Secondary | ICD-10-CM | POA: Diagnosis not present

## 2015-07-19 DIAGNOSIS — M1A00X0 Idiopathic chronic gout, unspecified site, without tophus (tophi): Secondary | ICD-10-CM | POA: Diagnosis not present

## 2015-08-01 DIAGNOSIS — I480 Paroxysmal atrial fibrillation: Secondary | ICD-10-CM | POA: Diagnosis not present

## 2015-08-01 DIAGNOSIS — R011 Cardiac murmur, unspecified: Secondary | ICD-10-CM | POA: Diagnosis not present

## 2015-08-01 DIAGNOSIS — R0602 Shortness of breath: Secondary | ICD-10-CM | POA: Diagnosis not present

## 2015-08-07 ENCOUNTER — Other Ambulatory Visit: Payer: Self-pay | Admitting: Family Medicine

## 2015-08-20 ENCOUNTER — Other Ambulatory Visit: Payer: Self-pay | Admitting: Family Medicine

## 2015-08-20 NOTE — Telephone Encounter (Signed)
Patient requesting refill. 

## 2015-08-21 ENCOUNTER — Other Ambulatory Visit: Payer: Self-pay | Admitting: Family Medicine

## 2015-08-22 NOTE — Telephone Encounter (Signed)
Patient requesting refill. 

## 2015-08-23 DIAGNOSIS — R011 Cardiac murmur, unspecified: Secondary | ICD-10-CM | POA: Diagnosis not present

## 2015-08-23 DIAGNOSIS — J45909 Unspecified asthma, uncomplicated: Secondary | ICD-10-CM | POA: Diagnosis not present

## 2015-08-23 DIAGNOSIS — I4891 Unspecified atrial fibrillation: Secondary | ICD-10-CM | POA: Diagnosis not present

## 2015-08-23 DIAGNOSIS — Z952 Presence of prosthetic heart valve: Secondary | ICD-10-CM | POA: Diagnosis not present

## 2015-08-23 DIAGNOSIS — Z9889 Other specified postprocedural states: Secondary | ICD-10-CM | POA: Diagnosis not present

## 2015-08-23 DIAGNOSIS — M1A00X0 Idiopathic chronic gout, unspecified site, without tophus (tophi): Secondary | ICD-10-CM | POA: Diagnosis not present

## 2015-08-23 DIAGNOSIS — I480 Paroxysmal atrial fibrillation: Secondary | ICD-10-CM | POA: Diagnosis not present

## 2015-08-23 DIAGNOSIS — E784 Other hyperlipidemia: Secondary | ICD-10-CM | POA: Diagnosis not present

## 2015-08-23 DIAGNOSIS — R000 Tachycardia, unspecified: Secondary | ICD-10-CM | POA: Diagnosis not present

## 2015-08-23 DIAGNOSIS — R0602 Shortness of breath: Secondary | ICD-10-CM | POA: Diagnosis not present

## 2015-08-23 DIAGNOSIS — E669 Obesity, unspecified: Secondary | ICD-10-CM | POA: Diagnosis not present

## 2015-09-03 ENCOUNTER — Other Ambulatory Visit: Payer: Self-pay | Admitting: Family Medicine

## 2015-09-04 NOTE — Telephone Encounter (Signed)
Patient requesting refill. 

## 2015-10-08 ENCOUNTER — Encounter: Payer: Self-pay | Admitting: Family Medicine

## 2015-10-08 ENCOUNTER — Ambulatory Visit (INDEPENDENT_AMBULATORY_CARE_PROVIDER_SITE_OTHER): Payer: Medicare Other | Admitting: Family Medicine

## 2015-10-08 ENCOUNTER — Other Ambulatory Visit: Payer: Self-pay | Admitting: Family Medicine

## 2015-10-08 VITALS — BP 124/68 | HR 100 | Temp 98.5°F | Resp 16 | Ht 64.0 in | Wt 182.0 lb

## 2015-10-08 DIAGNOSIS — E785 Hyperlipidemia, unspecified: Secondary | ICD-10-CM

## 2015-10-08 DIAGNOSIS — I480 Paroxysmal atrial fibrillation: Secondary | ICD-10-CM | POA: Diagnosis not present

## 2015-10-08 DIAGNOSIS — E8881 Metabolic syndrome: Secondary | ICD-10-CM

## 2015-10-08 DIAGNOSIS — I10 Essential (primary) hypertension: Secondary | ICD-10-CM | POA: Diagnosis not present

## 2015-10-08 DIAGNOSIS — J440 Chronic obstructive pulmonary disease with acute lower respiratory infection: Secondary | ICD-10-CM

## 2015-10-08 DIAGNOSIS — I5042 Chronic combined systolic (congestive) and diastolic (congestive) heart failure: Secondary | ICD-10-CM

## 2015-10-08 DIAGNOSIS — R739 Hyperglycemia, unspecified: Secondary | ICD-10-CM | POA: Diagnosis not present

## 2015-10-08 MED ORDER — ATORVASTATIN CALCIUM 80 MG PO TABS: 80.0000 mg | ORAL_TABLET | Freq: Every day | ORAL | Status: DC

## 2015-10-08 MED ORDER — METFORMIN HCL 500 MG PO TABS: 500.0000 mg | ORAL_TABLET | Freq: Two times a day (BID) | ORAL | Status: DC

## 2015-10-08 MED ORDER — HYDROCOD POLST-CPM POLST ER 10-8 MG/5ML PO SUER: 5.0000 mL | Freq: Two times a day (BID) | ORAL | Status: DC | PRN

## 2015-10-08 MED ORDER — TORSEMIDE 20 MG PO TABS: 20.0000 mg | ORAL_TABLET | Freq: Every day | ORAL | Status: DC

## 2015-10-08 NOTE — Telephone Encounter (Signed)
Patient requesting refill. 

## 2015-10-08 NOTE — Progress Notes (Signed)
Name: [name]   MRN: [id number]    DOB: October 28, 1940   Date:10/08/2015       Progress Note  Subjective  Chief Complaint  Chief Complaint  Patient presents with  . Congestive Heart Failure    patient is here for her 35-monthf/u  . Anxiety  . Gout  . Sinusitis    patient has been having some drainage, productive cough & fatigue. patient tried some lemon tea with honey and cinnamon. patient has taken some loratidine and flonase.    HPI  Chronic combined CHF: she is on Demadex and potassium, digoxin and  beta-blocker . No chest or SOB, she denies orthopnea. She has been compliant with medication. She is not on ACe because of cough, she was on Losartan for a period of time but bp dropped so cardiologist stopped medication. She denies orthopnea.  HTN: taking metoprolol half pill twice daily, doing well, bp much better controlled since aortic valve replacement. No chest pain , she has occasional SOB  Hyperlipidemia:she is taking Lipitor, she feels tired but only at the end of the day, not sure if related to Lipitor ( explained that is unlikely - she has a physical job , working in a school [employer])  Insomnia: sleeping well, only taking Trazodone prn, and denies snoring, wakes up feeling refreshed.   Gout: not recent symptoms, uric acid was 12 in June. She was on Allpurinol, but she stopped months ago  Dysmetabolic Syndrome: last hJGOT1X5.9%, she is not very compliant with diet. She has been physically active, walking daily . Denies polyphagia or polyuria  Afib: rate controlled, no fluttering sensation, taking betablocker.   COPD exacerbation: she developed a scratchy throat last week, followed by a productive cough, occasional wheezing, no SOB or fever. She is using Spiriva occasionally, advised to resume it to daily .   Patient Active Problem List   Diagnosis Date Noted  . Chronic systolic heart failure ([facility] 10/08/2015  . Chronic obstructive pulmonary disease ([facility] [place]  07/04/2015  . Controlled gout 06/06/2015  . Anxiety 02/09/2015  . Synovial cyst of popliteal space 02/09/2015  . Benign hypertension 02/09/2015  . Blood type A+ 02/09/2015  . Arterial branch occlusion of retina 02/09/2015  . Chronic constipation 02/09/2015  . Insomnia, persistent 02/09/2015  . Chronic combined systolic and diastolic CHF, NYHA class 1 ([facility] 02/09/2015  . Compromised kidney function 02/09/2015  . Dyslipidemia 02/09/2015  . Atrial fibrillation ([facility] 02/09/2015  . Arthritis urica 02/09/2015  . Hearing loss 02/09/2015  . History of open heart surgery 02/09/2015  . Chronic hoarseness 02/09/2015  . Cardiomegaly 02/09/2015  . Dysmetabolic syndrome 072/62/0355 . Migraine with aura and without status migrainosus, not intractable 02/09/2015  . Adult BMI 30+ 02/09/2015  . Hypertensive pulmonary vascular disease ([facility] 02/09/2015  . Allergic rhinitis, seasonal 02/09/2015  . Vitamin D deficiency 02/09/2015  . History of mitral valve repair 10/01/2012  . History of aortic valve replacement 10/01/2012  . Congestive heart failure ([facility] 10/01/2012    Past Surgical History  Procedure Laterality Date  . Abdominal hysterectomy  1996  . Cardiac valve replacement  2014    Family History  Problem Relation Age of Onset  . Cancer Mother   . Diabetes Mother   . Hypertension Father   . Hypertension Mother   . Heart failure Father   . Cancer Brother     bladder  . Cancer Sister     breast  . Hypertension Son   . Hypertension  Daughter     Social History   Social History  . Marital Status: Widowed    Spouse Name: N/A  . Number of Children: 2  . Years of Education: N/A   Occupational History  . Custodian    Social History Main Topics  . Smoking status: Former Smoker    Quit date: 09/09/2007  . Smokeless tobacco: Never Used  . Alcohol Use: No  . Drug Use: No  . Sexual Activity: Not Currently   Other Topics Concern  . Not on file   Social History Narrative      Current outpatient prescriptions:  .  acetaminophen (TYLENOL) 500 MG tablet, Take 1,000-1,500 mg by mouth every 6 (six) hours as needed for mild pain., Disp: , Rfl:  .  allopurinol (ZYLOPRIM) 300 MG tablet, Take 300 mg by mouth daily., Disp: , Rfl:  .  aspirin EC 81 MG tablet, Take 81 mg by mouth every evening., Disp: , Rfl:  .  atorvastatin (LIPITOR) 80 MG tablet, Take 1 tablet (80 mg total) by mouth daily., Disp: 30 tablet, Rfl: 3 .  colchicine 0.6 MG tablet, Take 0.6-1.2 mg by mouth as needed (for gout flares)., Disp: , Rfl:  .  diazepam (VALIUM) 5 MG tablet, Take 1 tablet (5 mg total) by mouth as needed. (Patient taking differently: Take 5 mg by mouth as needed for anxiety. ), Disp: 30 tablet, Rfl: 0 .  digoxin (LANOXIN) 0.25 MG tablet, Take 1 tablet (250 mcg total) by mouth every 4 (four) hours., Disp: 2 tablet, Rfl: 0 .  KLOR-CON M20 20 MEQ tablet, TAKE 1 TABLET BY MOUTH ONCE A DAY, Disp: 90 tablet, Rfl: 0 .  loratadine (CLARITIN) 10 MG tablet, Take 10 mg by mouth daily as needed for allergies., Disp: , Rfl:  .  metFORMIN (GLUCOPHAGE) 500 MG tablet, Take 1 tablet (500 mg total) by mouth 2 (two) times daily with a meal., Disp: 60 tablet, Rfl: 3 .  metoprolol tartrate (LOPRESSOR) 25 MG tablet, TAKE 1 TABLET BY MOUTH TWICE A DAY, Disp: 60 tablet, Rfl: 1 .  SPIRIVA HANDIHALER 18 MCG inhalation capsule, INHALE 1 CAPSULE VIA HANDIHALER ONCE DAILY AT THE SAME TIME EVERY DAY, Disp: 30 capsule, Rfl: 3 .  torsemide (DEMADEX) 20 MG tablet, Take 1 tablet (20 mg total) by mouth daily., Disp: 30 tablet, Rfl: 2 .  traZODone (DESYREL) 50 MG tablet, Take 50-100 mg by mouth at bedtime as needed for sleep., Disp: , Rfl:  .  Vitamin D, Ergocalciferol, (DRISDOL) 50000 UNITS CAPS capsule, Take 1 capsule (50,000 Units total) by mouth every 7 (seven) days. (Patient taking differently: Take 50,000 Units by mouth every 7 (seven) days. Pt takes on Tuesday.), Disp: 12 capsule, Rfl: 1 .  warfarin (COUMADIN) 5 MG  tablet, Take 5-7.5 mg by mouth every evening. Pt takes one tablet Sunday, Monday, Wednesday, and Friday.  Pt takes one and one-half tablet on Tuesday, Thursday, and Saturday., Disp: , Rfl:  .  chlorpheniramine-HYDROcodone (TUSSIONEX PENNKINETIC ER) 10-8 MG/5ML SUER, Take 5 mLs by mouth every 12 (twelve) hours as needed., Disp: 140 mL, Rfl: 0 .  [DISCONTINUED] metoprolol succinate (TOPROL-XL) 25 MG 24 hr tablet, Take 1 tablet (25 mg total) by mouth daily., Disp: 30 tablet, Rfl: 3  Allergies  Allergen Reactions  . Ace Inhibitors Cough  . Amoxicillin-Pot Clavulanate Nausea And Vomiting  . Codeine Nausea And Vomiting  . Penicillins Nausea And Vomiting and Other (See Comments)    Has patient had a PCN reaction causing  immediate rash, facial/tongue/throat swelling, SOB or lightheadedness with hypotension: No Has patient had a PCN reaction causing severe rash involving mucus membranes or skin necrosis: No Has patient had a PCN reaction that required hospitalization No Has patient had a PCN reaction occurring within the last 10 years: No If all of the above answers are "NO", then may proceed with Cephalosporin use.  . Rosuvastatin Other (See Comments)    Reaction:  Joint pain   . Tetanus-Diphtheria Toxoids Td Swelling     ROS  Constitutional: Negative for fever or weight change.  Respiratory: Positive for cough and shortness of breath.   Cardiovascular: Negative for chest pain or palpitations.  Gastrointestinal: Negative for abdominal pain, no bowel changes.  Musculoskeletal: Negative for gait problem or joint swelling.  Skin: Negative for rash.  Neurological: Negative for dizziness or headache.  No other specific complaints in a complete review of systems (except as listed in HPI above).  Objective  Filed Vitals:   10/08/15 1455  BP: 124/68  Pulse: 100  Temp: 98.5 F (36.9 C)  TempSrc: Oral  Resp: 16  Height: 5' 4"  (1.626 m)  Weight: 182 lb (82.555 kg)  SpO2: 98%    Body mass  index is 31.22 kg/(m^2).  Physical Exam  Constitutional: Patient appears well-developed and well-nourished. Obese No distress.  HEENT: head atraumatic, normocephalic, pupils equal and reactive to light, neck supple, throat within normal limits, no post-nasal drainage, boggy turbinates.  Cardiovascular: normal  rate, irregular rhythm, systolic murmur 3/5 on 2nd intercostal spaces bilaterally, louder on left border. No BLE edema. Pulmonary/Chest: Effort normal and breath sounds normal. No respiratory distress. Abdominal: Soft. There is no tenderness. Psychiatric: Patient has a normal mood and affect. behavior is normal. Judgment and thought content normal.  Recent Results (from the past 2160 hour(s))  CBC     Status: Abnormal   Collection Time: 07/17/15  3:15 PM  Result Value Ref Range   WBC 9.0 3.6 - 11.0 K/uL   RBC 4.55 3.80 - 5.20 MIL/uL   Hemoglobin 13.1 12.0 - 16.0 g/dL   HCT 40.3 35.0 - 47.0 %   MCV 88.5 80.0 - 100.0 fL   MCH 28.8 26.0 - 34.0 pg   MCHC 32.6 32.0 - 36.0 g/dL   RDW 15.6 (H) 11.5 - 14.5 %   Platelets 230 150 - 440 K/uL  Troponin I     Status: Abnormal   Collection Time: 07/17/15  3:15 PM  Result Value Ref Range   Troponin I 0.05 (H) <0.031 ng/mL    Comment: READ BACK AND VERIFIED WITH  NELLIE MONAR AT 5170 07/17/15 SDR        PERSISTENTLY INCREASED TROPONIN VALUES IN THE RANGE OF 0.04-0.49 ng/mL CAN BE SEEN IN:       -UNSTABLE ANGINA       -CONGESTIVE HEART FAILURE       -MYOCARDITIS       -CHEST TRAUMA       -ARRYHTHMIAS       -LATE PRESENTING MYOCARDIAL INFARCTION       -COPD   CLINICAL FOLLOW-UP RECOMMENDED.   Comprehensive metabolic panel     Status: Abnormal   Collection Time: 07/17/15  3:15 PM  Result Value Ref Range   Sodium 140 135 - 145 mmol/L   Potassium 3.2 (L) 3.5 - 5.1 mmol/L   Chloride 104 101 - 111 mmol/L   CO2 29 22 - 32 mmol/L   Glucose, Bld 101 (H) 65 - 99 mg/dL  BUN 22 (H) 6 - 20 mg/dL   Creatinine, Ser 0.90 0.44 - 1.00  mg/dL   Calcium 9.2 8.9 - 10.3 mg/dL   Total Protein 7.2 6.5 - 8.1 g/dL   Albumin 4.3 3.5 - 5.0 g/dL   AST 46 (H) 15 - 41 U/L   ALT 77 (H) 14 - 54 U/L   Alkaline Phosphatase 96 38 - 126 U/L   Total Bilirubin 1.4 (H) 0.3 - 1.2 mg/dL   GFR calc non Af Amer >60 >60 mL/min   GFR calc Af Amer >60 >60 mL/min    Comment: (NOTE) The eGFR has been calculated using the CKD EPI equation. This calculation has not been validated in all clinical situations. eGFR's persistently <60 mL/min signify possible Chronic Kidney Disease.    Anion gap 7 5 - 15  Protime-INR     Status: Abnormal   Collection Time: 07/17/15  3:15 PM  Result Value Ref Range   Prothrombin Time 23.1 (H) 11.4 - 15.0 seconds   INR 2.03   Troponin I     Status: Abnormal   Collection Time: 07/17/15  4:57 PM  Result Value Ref Range   Troponin I 0.05 (H) <0.031 ng/mL    Comment: RESULTS PREVIOUSLY CALLED AT  1558 07/17/15 BY SDR        PERSISTENTLY INCREASED TROPONIN VALUES IN THE RANGE OF 0.04-0.49 ng/mL CAN BE SEEN IN:       -UNSTABLE ANGINA       -CONGESTIVE HEART FAILURE       -MYOCARDITIS       -CHEST TRAUMA       -ARRYHTHMIAS       -LATE PRESENTING MYOCARDIAL INFARCTION       -COPD   CLINICAL FOLLOW-UP RECOMMENDED.   CBC     Status: Abnormal   Collection Time: 07/18/15  7:13 PM  Result Value Ref Range   WBC 8.1 3.6 - 11.0 K/uL   RBC 4.23 3.80 - 5.20 MIL/uL   Hemoglobin 12.5 12.0 - 16.0 g/dL   HCT 37.9 35.0 - 47.0 %   MCV 89.5 80.0 - 100.0 fL   MCH 29.6 26.0 - 34.0 pg   MCHC 33.0 32.0 - 36.0 g/dL   RDW 15.6 (H) 11.5 - 14.5 %   Platelets 199 150 - 440 K/uL  Comprehensive metabolic panel     Status: Abnormal   Collection Time: 07/18/15  7:13 PM  Result Value Ref Range   Sodium 139 135 - 145 mmol/L   Potassium 3.6 3.5 - 5.1 mmol/L   Chloride 107 101 - 111 mmol/L   CO2 25 22 - 32 mmol/L   Glucose, Bld 98 65 - 99 mg/dL   BUN 26 (H) 6 - 20 mg/dL   Creatinine, Ser 0.90 0.44 - 1.00 mg/dL   Calcium 9.4 8.9 -  10.3 mg/dL   Total Protein 7.1 6.5 - 8.1 g/dL   Albumin 4.0 3.5 - 5.0 g/dL   AST 90 (H) 15 - 41 U/L   ALT 113 (H) 14 - 54 U/L   Alkaline Phosphatase 104 38 - 126 U/L   Total Bilirubin 0.7 0.3 - 1.2 mg/dL   GFR calc non Af Amer >60 >60 mL/min   GFR calc Af Amer >60 >60 mL/min    Comment: (NOTE) The eGFR has been calculated using the CKD EPI equation. This calculation has not been validated in all clinical situations. eGFR's persistently <60 mL/min signify possible Chronic Kidney Disease.    Anion gap 7  5 - 15  Troponin I     Status: Abnormal   Collection Time: 07/18/15  7:13 PM  Result Value Ref Range   Troponin I 0.05 (H) <0.031 ng/mL    Comment: READ BACK AND VERIFIED BRIAN GRAY ON 07/18/15 AT 2006PM BY TB        PERSISTENTLY INCREASED TROPONIN VALUES IN THE RANGE OF 0.04-0.49 ng/mL CAN BE SEEN IN:       -UNSTABLE ANGINA       -CONGESTIVE HEART FAILURE       -MYOCARDITIS       -CHEST TRAUMA       -ARRYHTHMIAS       -LATE PRESENTING MYOCARDIAL INFARCTION       -COPD   CLINICAL FOLLOW-UP RECOMMENDED.     PHQ2/9: Depression screen [facility] 2/9 10/08/2015 06/06/2015 02/09/2015  Decreased Interest 0 0 0  Down, Depressed, Hopeless 0 0 2  PHQ - 2 Score 0 0 2  Altered sleeping - - 0  Tired, decreased energy - - 0  Change in appetite - - 1  Feeling bad or failure about yourself  - - 1  Trouble concentrating - - 0  Moving slowly or fidgety/restless - - 0  Suicidal thoughts - - 0  PHQ-9 Score - - 4  Difficult doing work/chores - - Somewhat difficult     Fall Risk: Fall Risk  10/08/2015 06/06/2015 02/09/2015  Falls in the past year? No No No     Functional Status Survey: Is the patient deaf or have difficulty hearing?: No Does the patient have difficulty seeing, even when wearing glasses/contacts?: No Does the patient have difficulty concentrating, remembering, or making decisions?: No Does the patient have difficulty walking or climbing stairs?: No Does the patient have  difficulty dressing or bathing?: No Does the patient have difficulty doing errands alone such as visiting a doctor's office or shopping?: No    Assessment & Plan  1. Chronic combined systolic and diastolic CHF, NYHA class 1 (HCC)  - torsemide (DEMADEX) 20 MG tablet; Take 1 tablet (20 mg total) by mouth daily.  Dispense: 30 tablet; Refill: 2  2. Chronic obstructive pulmonary disease with acute lower respiratory infection (HCC)  Discussed prednisone po but it has caused tachycardia, she also can't tolerate a beta-agonist because it caused rapid afib. Discussed rest, mucinex, and cough medication   - chlorpheniramine-HYDROcodone (TUSSIONEX PENNKINETIC ER) 10-8 MG/5ML SUER; Take 5 mLs by mouth every 12 (twelve) hours as needed.  Dispense: 140 mL; Refill: 0  3. Dyslipidemia  - atorvastatin (LIPITOR) 80 MG tablet; Take 1 tablet (80 mg total) by mouth daily.  Dispense: 30 tablet; Refill: 3  4. Paroxysmal atrial fibrillation (HCC)  Rate controlled  5. Benign hypertension  At goal, on half dose of Metoprolol twice daily   6. Dysmetabolic syndrome  - metFORMIN (GLUCOPHAGE) 500 MG tablet; Take 1 tablet (500 mg total) by mouth 2 (two) times daily with a meal.  Dispense: 60 tablet; Refill: 3  7. Hyperglycemia  - metFORMIN (GLUCOPHAGE) 500 MG tablet; Take 1 tablet (500 mg total) by mouth 2 (two) times daily with a meal.  Dispense: 60 tablet; Refill: 3

## 2015-10-15 ENCOUNTER — Other Ambulatory Visit: Payer: Self-pay | Admitting: Family Medicine

## 2015-10-16 DIAGNOSIS — I480 Paroxysmal atrial fibrillation: Secondary | ICD-10-CM | POA: Diagnosis not present

## 2015-10-19 ENCOUNTER — Telehealth: Payer: Self-pay | Admitting: Family Medicine

## 2015-10-19 MED ORDER — FLUTICASONE PROPIONATE 50 MCG/ACT NA SUSP: 2.0000 | Freq: Every day | NASAL | Status: DC

## 2015-10-19 NOTE — Telephone Encounter (Signed)
Sent fluticasone

## 2015-10-19 NOTE — Telephone Encounter (Signed)
Patient requesting refill. 

## 2015-10-19 NOTE — Telephone Encounter (Signed)
Patient would like a cold medicine sent into [facility] [place].  She said she has been sneezing with congestion and headache.

## 2015-10-31 ENCOUNTER — Telehealth: Payer: Self-pay | Admitting: Family Medicine

## 2015-10-31 DIAGNOSIS — J440 Chronic obstructive pulmonary disease with acute lower respiratory infection: Secondary | ICD-10-CM

## 2015-10-31 MED ORDER — HYDROCOD POLST-CPM POLST ER 10-8 MG/5ML PO SUER
5.0000 mL | Freq: Two times a day (BID) | ORAL | Status: DC | PRN
Start: 2015-10-31 — End: 2016-01-10

## 2015-10-31 NOTE — Telephone Encounter (Signed)
I can fill the Tussionex, she needs to be seen if not better. I also think she may need to see a pulmonologist. Having recurrent COPD flares

## 2015-10-31 NOTE — Telephone Encounter (Signed)
Patient has had cough, yellow mucus and headache since last office visit 10-08-15. Requesting refill on Tussinex, and is also asking for a zpack. Please send both prescriptions to [facility]. She can be reached at [phone]

## 2015-11-05 DIAGNOSIS — H04223 Epiphora due to insufficient drainage, bilateral lacrimal glands: Secondary | ICD-10-CM | POA: Diagnosis not present

## 2015-11-05 DIAGNOSIS — H2513 Age-related nuclear cataract, bilateral: Secondary | ICD-10-CM | POA: Diagnosis not present

## 2015-11-13 DIAGNOSIS — I480 Paroxysmal atrial fibrillation: Secondary | ICD-10-CM | POA: Diagnosis not present

## 2015-11-19 DIAGNOSIS — H04223 Epiphora due to insufficient drainage, bilateral lacrimal glands: Secondary | ICD-10-CM | POA: Diagnosis not present

## 2015-12-01 ENCOUNTER — Other Ambulatory Visit: Payer: Self-pay | Admitting: Family Medicine

## 2015-12-11 ENCOUNTER — Other Ambulatory Visit: Payer: Self-pay | Admitting: Family Medicine

## 2015-12-17 ENCOUNTER — Other Ambulatory Visit: Payer: Self-pay | Admitting: Family Medicine

## 2015-12-17 NOTE — Telephone Encounter (Signed)
Patient requesting refill. 

## 2015-12-25 DIAGNOSIS — I480 Paroxysmal atrial fibrillation: Secondary | ICD-10-CM | POA: Diagnosis not present

## 2016-01-07 ENCOUNTER — Other Ambulatory Visit: Payer: Self-pay | Admitting: Family Medicine

## 2016-01-07 ENCOUNTER — Ambulatory Visit: Payer: Medicare Other | Admitting: Family Medicine

## 2016-01-10 ENCOUNTER — Encounter: Payer: Self-pay | Admitting: Family Medicine

## 2016-01-10 ENCOUNTER — Ambulatory Visit (INDEPENDENT_AMBULATORY_CARE_PROVIDER_SITE_OTHER): Payer: Medicare Other | Admitting: Family Medicine

## 2016-01-10 VITALS — BP 136/88 | HR 82 | Temp 98.1°F | Resp 18 | Ht 64.0 in | Wt 178.2 lb

## 2016-01-10 DIAGNOSIS — H938X1 Other specified disorders of right ear: Secondary | ICD-10-CM | POA: Diagnosis not present

## 2016-01-10 DIAGNOSIS — R42 Dizziness and giddiness: Secondary | ICD-10-CM

## 2016-01-10 DIAGNOSIS — G43109 Migraine with aura, not intractable, without status migrainosus: Secondary | ICD-10-CM | POA: Diagnosis not present

## 2016-01-10 MED ORDER — MECLIZINE HCL 12.5 MG PO TABS: 12.5000 mg | ORAL_TABLET | Freq: Three times a day (TID) | ORAL | Status: DC | PRN

## 2016-01-10 MED ORDER — PROMETHAZINE HCL 12.5 MG PO TABS: 12.5000 mg | ORAL_TABLET | Freq: Three times a day (TID) | ORAL | Status: DC | PRN

## 2016-01-10 NOTE — Progress Notes (Signed)
Name: [name]   MRN: [id number]    DOB: 1941/08/26   Date:01/10/2016       Progress Note  Subjective  Chief Complaint  Chief Complaint  Patient presents with  . Ear Pain    Onset-1 week on her right side, feels like she is talking inside her head, unable to hear well on the right side and it is muffed sound.  . Dizziness    Onset-1 week, unchanged, States the room spin and will make her nausea, very fatigue and just wants to lay down in the floor. The spinning will only stop when she lays down or stops moving    HPI  Vertigo/ear fullness/migraine: symptoms of ear fullness on right side has been present for 2 weeks. She also has sensitive to sound only on the right side. No hearing loss - but sound feels far away. No fever. She states that over the past week she has been feeling light headed, and a few episodes of spinning sensation, she also feels tired, and today she had some nausea and also seen squiggly line on both eyes - symptoms of her aura .  She has not taken any pain medication. Vertigo improves when she is still, worse with head movement  Patient Active Problem List   Diagnosis Date Noted  . Chronic systolic heart failure ([place]) 10/08/2015  . Chronic obstructive pulmonary disease ([place]) 07/04/2015  . Controlled gout 06/06/2015  . Anxiety 02/09/2015  . Synovial cyst of popliteal space 02/09/2015  . Benign hypertension 02/09/2015  . Blood type A+ 02/09/2015  . Arterial branch occlusion of retina 02/09/2015  . Chronic constipation 02/09/2015  . Insomnia, persistent 02/09/2015  . Chronic combined systolic and diastolic CHF, NYHA class 1 ([place]) 02/09/2015  . Compromised kidney function 02/09/2015  . Dyslipidemia 02/09/2015  . Atrial fibrillation ([place]) 02/09/2015  . Arthritis urica 02/09/2015  . Hearing loss 02/09/2015  . History of open heart surgery 02/09/2015  . Chronic hoarseness 02/09/2015  . Cardiomegaly 02/09/2015  . Dysmetabolic syndrome 0000000  . Migraine  with aura and without status migrainosus, not intractable 02/09/2015  . Adult BMI 30+ 02/09/2015  . Hypertensive pulmonary vascular disease ([place]) 02/09/2015  . Allergic rhinitis, seasonal 02/09/2015  . Vitamin D deficiency 02/09/2015  . History of mitral valve repair 10/01/2012  . History of aortic valve replacement 10/01/2012  . Congestive heart failure ([place]) 10/01/2012    Past Surgical History  Procedure Laterality Date  . Abdominal hysterectomy  1996  . Cardiac valve replacement  2014    Family History  Problem Relation Age of Onset  . Cancer Mother   . Diabetes Mother   . Hypertension Father   . Hypertension Mother   . Heart failure Father   . Cancer Brother     bladder  . Cancer Sister     breast  . Hypertension Son   . Hypertension Daughter     Social History   Social History  . Marital Status: Widowed    Spouse Name: N/A  . Number of Children: 2  . Years of Education: N/A   Occupational History  . Custodian    Social History Main Topics  . Smoking status: Former Smoker    Quit date: 09/09/2007  . Smokeless tobacco: Never Used  . Alcohol Use: No  . Drug Use: No  . Sexual Activity: Not Currently   Other Topics Concern  . Not on file   Social History Narrative     Current outpatient prescriptions:  .  acetaminophen (TYLENOL) 500 MG tablet, Take 1,000-1,500 mg by mouth every 6 (six) hours as needed for mild pain., Disp: , Rfl:  .  allopurinol (ZYLOPRIM) 300 MG tablet, Take 300 mg by mouth daily., Disp: , Rfl:  .  allopurinol (ZYLOPRIM) 300 MG tablet, TAKE 1 TABLET BY MOUTH DAILY, Disp: 90 tablet, Rfl: 1 .  aspirin EC 81 MG tablet, Take 81 mg by mouth every evening., Disp: , Rfl:  .  atorvastatin (LIPITOR) 80 MG tablet, Take 1 tablet (80 mg total) by mouth daily., Disp: 30 tablet, Rfl: 3 .  COLCRYS 0.6 MG tablet, TAKE 2 TABS BY MOUTH AT ONSET OF PAIN. REPEAT WITH 1 TAB IN 1 HOUR. *MAX 3TABS/24 HOURS*, Disp: 30 tablet, Rfl: 1 .  diazepam (VALIUM) 5 MG  tablet, TAKE 1 TABLET BY MOUTH AS NEEDED, Disp: 30 tablet, Rfl: 0 .  digoxin (LANOXIN) 0.125 MG tablet, TAKE 1 TABLET (0.125 MG TOTAL) BY MOUTH ONCE DAILY., Disp: , Rfl: 6 .  fluticasone (FLONASE) 50 MCG/ACT nasal spray, Place 2 sprays into both nostrils daily., Disp: 16 g, Rfl: 6 .  KLOR-CON M20 20 MEQ tablet, TAKE 1 TABLET BY MOUTH ONCE A DAY, Disp: 90 tablet, Rfl: 1 .  loratadine (CLARITIN) 10 MG tablet, Take 10 mg by mouth daily as needed for allergies., Disp: , Rfl:  .  metFORMIN (GLUCOPHAGE) 500 MG tablet, Take 1 tablet (500 mg total) by mouth 2 (two) times daily with a meal., Disp: 60 tablet, Rfl: 3 .  metoprolol tartrate (LOPRESSOR) 25 MG tablet, TAKE 1 TABLET BY MOUTH TWICE A DAY, Disp: 60 tablet, Rfl: 1 .  SPIRIVA HANDIHALER 18 MCG inhalation capsule, INHALE 1 CAPSULE VIA HANDIHALER ONCE DAILY AT THE SAME TIME EVERY DAY, Disp: 30 capsule, Rfl: 3 .  torsemide (DEMADEX) 20 MG tablet, TAKE 1 TABLET (20 MG TOTAL) BY MOUTH DAILY., Disp: 30 tablet, Rfl: 2 .  traZODone (DESYREL) 50 MG tablet, Take 50-100 mg by mouth at bedtime as needed for sleep., Disp: , Rfl:  .  Vitamin D, Ergocalciferol, (DRISDOL) 50000 units CAPS capsule, TAKE 1 CAPSULE (50,000 UNITS TOTAL) BY MOUTH EVERY 7 (SEVEN) DAYS., Disp: 12 capsule, Rfl: 1 .  warfarin (COUMADIN) 5 MG tablet, Take 5-7.5 mg by mouth every evening. Pt takes one tablet Sunday, Monday, Wednesday, and Friday.  Pt takes one and one-half tablet on Tuesday, Thursday, and Saturday., Disp: , Rfl:  .  meclizine (ANTIVERT) 12.5 MG tablet, Take 1 tablet (12.5 mg total) by mouth 3 (three) times daily as needed for dizziness., Disp: 30 tablet, Rfl: 0 .  promethazine (PHENERGAN) 12.5 MG tablet, Take 1 tablet (12.5 mg total) by mouth every 8 (eight) hours as needed for nausea or vomiting., Disp: 20 tablet, Rfl: 0 .  [DISCONTINUED] metoprolol succinate (TOPROL-XL) 25 MG 24 hr tablet, Take 1 tablet (25 mg total) by mouth daily., Disp: 30 tablet, Rfl: 3  Allergies   Allergen Reactions  . Ace Inhibitors Cough  . Amoxicillin-Pot Clavulanate Nausea And Vomiting  . Codeine Nausea And Vomiting  . Penicillins Nausea And Vomiting and Other (See Comments)    Has patient had a PCN reaction causing immediate rash, facial/tongue/throat swelling, SOB or lightheadedness with hypotension: No Has patient had a PCN reaction causing severe rash involving mucus membranes or skin necrosis: No Has patient had a PCN reaction that required hospitalization No Has patient had a PCN reaction occurring within the last 10 years: No If all of the above answers are "NO", then may proceed with  Cephalosporin use.  . Rosuvastatin Other (See Comments)    Reaction:  Joint pain   . Tetanus-Diphtheria Toxoids Td Swelling     ROS  Ten systems reviewed and is negative except as mentioned in HPI   Objective  Filed Vitals:   01/10/16 0924  BP: 136/88  Pulse: 82  Temp: 98.1 F (36.7 C)  TempSrc: Oral  Resp: 18  Height: 5\' 4"  (1.626 m)  Weight: 178 lb 3.2 oz (80.831 kg)  SpO2: 97%    Body mass index is 30.57 kg/(m^2).  Physical Exam  Constitutional: Patient appears well-developed and well-nourished. Obese No distress.  HEENT: head atraumatic, normocephalic, pupils equal and reactive to light, ears normal TM, no nystagmus, neck supple, throat within normal limits Cardiovascular: Normal rate, regular rhythm, systolic ejection murmur No BLE edema. Pulmonary/Chest: Effort normal and breath sounds normal. No respiratory distress. Abdominal: Soft.  There is no tenderness. Psychiatric: Patient has a normal mood and affect. behavior is normal. Judgment and thought content normal. Neurological : Awake, alert and oriented. No paresthesia, Romberg negative. Cranial nerve intact  PHQ2/9: Depression screen [facility] 2/9 01/10/2016 10/08/2015 06/06/2015 02/09/2015  Decreased Interest 0 0 0 0  Down, Depressed, Hopeless 0 0 0 2  PHQ - 2 Score 0 0 0 2  Altered sleeping - - - 0  Tired,  decreased energy - - - 0  Change in appetite - - - 1  Feeling bad or failure about yourself  - - - 1  Trouble concentrating - - - 0  Moving slowly or fidgety/restless - - - 0  Suicidal thoughts - - - 0  PHQ-9 Score - - - 4  Difficult doing work/chores - - - Somewhat difficult     Fall Risk: Fall Risk  01/10/2016 10/08/2015 06/06/2015 02/09/2015  Falls in the past year? No No No No      Functional Status Survey: Is the patient deaf or have difficulty hearing?: No Does the patient have difficulty seeing, even when wearing glasses/contacts?: No Does the patient have difficulty concentrating, remembering, or making decisions?: No Does the patient have difficulty walking or climbing stairs?: No Does the patient have difficulty dressing or bathing?: No Does the patient have difficulty doing errands alone such as visiting a doctor's office or shopping?: No    Assessment & Plan  1. Ear fullness, right  - Ambulatory referral to ENT  2. Vertigo  - Ambulatory referral to ENT - meclizine (ANTIVERT) 12.5 MG tablet; Take 1 tablet (12.5 mg total) by mouth 3 (three) times daily as needed for dizziness.  Dispense: 30 tablet; Refill: 0 - promethazine (PHENERGAN) 12.5 MG tablet; Take 1 tablet (12.5 mg total) by mouth every 8 (eight) hours as needed for nausea or vomiting.  Dispense: 20 tablet; Refill: 0  3. Migraine with aura and without status migrainosus, not intractable  She is currently having aura - explained that migraine can cause ear symptoms and discussed prednisone and pain medication right now, but she is afraid of taking steroids - caused tachycardia in the past. She states she has aura when she is stressed but will resolve after she rests today.

## 2016-01-14 ENCOUNTER — Ambulatory Visit (INDEPENDENT_AMBULATORY_CARE_PROVIDER_SITE_OTHER): Payer: Medicare Other | Admitting: Family Medicine

## 2016-01-14 ENCOUNTER — Encounter: Payer: Self-pay | Admitting: Family Medicine

## 2016-01-14 VITALS — BP 126/72 | HR 73 | Temp 97.7°F | Resp 16 | Ht 64.0 in | Wt 178.7 lb

## 2016-01-14 DIAGNOSIS — N951 Menopausal and female climacteric states: Secondary | ICD-10-CM

## 2016-01-14 DIAGNOSIS — E8881 Metabolic syndrome: Secondary | ICD-10-CM | POA: Diagnosis not present

## 2016-01-14 DIAGNOSIS — J440 Chronic obstructive pulmonary disease with acute lower respiratory infection: Secondary | ICD-10-CM

## 2016-01-14 DIAGNOSIS — I480 Paroxysmal atrial fibrillation: Secondary | ICD-10-CM | POA: Diagnosis not present

## 2016-01-14 DIAGNOSIS — G43109 Migraine with aura, not intractable, without status migrainosus: Secondary | ICD-10-CM

## 2016-01-14 DIAGNOSIS — I10 Essential (primary) hypertension: Secondary | ICD-10-CM

## 2016-01-14 DIAGNOSIS — E785 Hyperlipidemia, unspecified: Secondary | ICD-10-CM

## 2016-01-14 DIAGNOSIS — H938X1 Other specified disorders of right ear: Secondary | ICD-10-CM

## 2016-01-14 DIAGNOSIS — R739 Hyperglycemia, unspecified: Secondary | ICD-10-CM

## 2016-01-14 DIAGNOSIS — R42 Dizziness and giddiness: Secondary | ICD-10-CM

## 2016-01-14 DIAGNOSIS — Z79899 Other long term (current) drug therapy: Secondary | ICD-10-CM | POA: Diagnosis not present

## 2016-01-14 MED ORDER — ESTROGENS, CONJUGATED 0.625 MG/GM VA CREA: 1.0000 | TOPICAL_CREAM | Freq: Every day | VAGINAL | Status: DC

## 2016-01-14 MED ORDER — TIOTROPIUM BROMIDE MONOHYDRATE 18 MCG IN CAPS: ORAL_CAPSULE | RESPIRATORY_TRACT | Status: DC

## 2016-01-14 MED ORDER — METOPROLOL TARTRATE 25 MG PO TABS: 25.0000 mg | ORAL_TABLET | Freq: Two times a day (BID) | ORAL | Status: DC

## 2016-01-14 NOTE — Progress Notes (Signed)
Name: [name]   MRN: [id number]    DOB: 02-02-1941   Date:01/14/2016       Progress Note  Subjective  Chief Complaint  Chief Complaint  Patient presents with  . COPD    patient is here for her 46-month f/u  . dyslipidemia  . paroxysmal atrial fibrillation  . Hypertension  . Hyperglycemia  . Dysmetabolic syndrome    HPI  Chronic combined CHF: she is on Demadex and potassium, digoxin and beta-blocker . No chest or SOB, she denies orthopnea. She has been compliant with medication. She is not on ACE because of cough, she was on Losartan for a period of time but bp dropped so cardiologist, Dr. Clayborn Bigness stopped medication.   HTN: taking metoprolol, bp much better controlled since aortic valve replacement. No chest pain , no current SOB  Hyperlipidemia:she was taking Lipitor, but it was causing muscle aches and she stopped taking medication about 2 months ago.   Insomnia: sleeping well, only taking Trazodone prn, and denies snoring, wakes up feeling refreshed.   Gout: not recent symptoms, uric acid was 12 in June. She has been on  Allopurinol and last uric acid was at goal.   Dysmetabolic Syndrome: last Q000111Q 5.9%, she is not very compliant with diet. She has been physically active, walking daily . Denies polyphagia, polydipsia  or polyuria  Afib: rate controlled, no fluttering sensation, taking betablocker.   COPD : she is on Spiriva, currently doing well, no cough or SOB  Vertigo: started last week, referred to ENT but appointment was not made because she had a balance. She will pay it today and scheduled a follow up. Last episode was yesterday at church, but ear sensitivity has improved, she will follow up with ENT.   Vaginal dryness: she would like to try Premarin cream   Patient Active Problem List   Diagnosis Date Noted  . Chronic systolic heart failure ([place]) 10/08/2015  . Chronic obstructive pulmonary disease ([place]) 07/04/2015  . Controlled gout 06/06/2015  .  Anxiety 02/09/2015  . Synovial cyst of popliteal space 02/09/2015  . Benign hypertension 02/09/2015  . Blood type A+ 02/09/2015  . Arterial branch occlusion of retina 02/09/2015  . Chronic constipation 02/09/2015  . Insomnia, persistent 02/09/2015  . Chronic combined systolic and diastolic CHF, NYHA class 1 ([place]) 02/09/2015  . Compromised kidney function 02/09/2015  . Dyslipidemia 02/09/2015  . Atrial fibrillation ([place]) 02/09/2015  . Arthritis urica 02/09/2015  . Hearing loss 02/09/2015  . History of open heart surgery 02/09/2015  . Chronic hoarseness 02/09/2015  . Cardiomegaly 02/09/2015  . Dysmetabolic syndrome 0000000  . Migraine with aura and without status migrainosus, not intractable 02/09/2015  . Adult BMI 30+ 02/09/2015  . Hypertensive pulmonary vascular disease ([place]) 02/09/2015  . Allergic rhinitis, seasonal 02/09/2015  . Vitamin D deficiency 02/09/2015  . History of mitral valve repair 10/01/2012  . History of aortic valve replacement 10/01/2012  . Congestive heart failure ([place]) 10/01/2012    Past Surgical History  Procedure Laterality Date  . Abdominal hysterectomy  1996  . Cardiac valve replacement  2014    Family History  Problem Relation Age of Onset  . Cancer Mother   . Diabetes Mother   . Hypertension Father   . Hypertension Mother   . Heart failure Father   . Cancer Brother     bladder  . Cancer Sister     breast  . Hypertension Son   . Hypertension Daughter  Social History   Social History  . Marital Status: Widowed    Spouse Name: N/A  . Number of Children: 2  . Years of Education: N/A   Occupational History  . Custodian    Social History Main Topics  . Smoking status: Former Smoker    Quit date: 09/09/2007  . Smokeless tobacco: Never Used  . Alcohol Use: No  . Drug Use: No  . Sexual Activity: Not Currently   Other Topics Concern  . Not on file   Social History Narrative     Current outpatient prescriptions:  .   acetaminophen (TYLENOL) 500 MG tablet, Take 1,000-1,500 mg by mouth every 6 (six) hours as needed for mild pain., Disp: , Rfl:  .  allopurinol (ZYLOPRIM) 300 MG tablet, Take 300 mg by mouth daily., Disp: , Rfl:  .  allopurinol (ZYLOPRIM) 300 MG tablet, TAKE 1 TABLET BY MOUTH DAILY, Disp: 90 tablet, Rfl: 1 .  aspirin EC 81 MG tablet, Take 81 mg by mouth every evening., Disp: , Rfl:  .  atorvastatin (LIPITOR) 80 MG tablet, Take 1 tablet (80 mg total) by mouth daily., Disp: 30 tablet, Rfl: 3 .  COLCRYS 0.6 MG tablet, TAKE 2 TABS BY MOUTH AT ONSET OF PAIN. REPEAT WITH 1 TAB IN 1 HOUR. *MAX 3TABS/24 HOURS*, Disp: 30 tablet, Rfl: 1 .  diazepam (VALIUM) 5 MG tablet, TAKE 1 TABLET BY MOUTH AS NEEDED, Disp: 30 tablet, Rfl: 0 .  digoxin (LANOXIN) 0.125 MG tablet, TAKE 1 TABLET (0.125 MG TOTAL) BY MOUTH ONCE DAILY., Disp: , Rfl: 6 .  fluticasone (FLONASE) 50 MCG/ACT nasal spray, Place 2 sprays into both nostrils daily., Disp: 16 g, Rfl: 6 .  KLOR-CON M20 20 MEQ tablet, TAKE 1 TABLET BY MOUTH ONCE A DAY, Disp: 90 tablet, Rfl: 1 .  loratadine (CLARITIN) 10 MG tablet, Take 10 mg by mouth daily as needed for allergies., Disp: , Rfl:  .  meclizine (ANTIVERT) 12.5 MG tablet, Take 1 tablet (12.5 mg total) by mouth 3 (three) times daily as needed for dizziness., Disp: 30 tablet, Rfl: 0 .  metFORMIN (GLUCOPHAGE) 500 MG tablet, Take 1 tablet (500 mg total) by mouth 2 (two) times daily with a meal., Disp: 60 tablet, Rfl: 3 .  metoprolol tartrate (LOPRESSOR) 25 MG tablet, TAKE 1 TABLET BY MOUTH TWICE A DAY, Disp: 60 tablet, Rfl: 1 .  promethazine (PHENERGAN) 12.5 MG tablet, Take 1 tablet (12.5 mg total) by mouth every 8 (eight) hours as needed for nausea or vomiting., Disp: 20 tablet, Rfl: 0 .  SPIRIVA HANDIHALER 18 MCG inhalation capsule, INHALE 1 CAPSULE VIA HANDIHALER ONCE DAILY AT THE SAME TIME EVERY DAY, Disp: 30 capsule, Rfl: 3 .  torsemide (DEMADEX) 20 MG tablet, TAKE 1 TABLET (20 MG TOTAL) BY MOUTH DAILY., Disp: 30  tablet, Rfl: 2 .  traZODone (DESYREL) 50 MG tablet, Take 50-100 mg by mouth at bedtime as needed for sleep., Disp: , Rfl:  .  Vitamin D, Ergocalciferol, (DRISDOL) 50000 units CAPS capsule, TAKE 1 CAPSULE (50,000 UNITS TOTAL) BY MOUTH EVERY 7 (SEVEN) DAYS., Disp: 12 capsule, Rfl: 1 .  warfarin (COUMADIN) 5 MG tablet, Take 5-7.5 mg by mouth every evening. Pt takes one tablet Sunday, Monday, Wednesday, and Friday.  Pt takes one and one-half tablet on Tuesday, Thursday, and Saturday., Disp: , Rfl:  .  [DISCONTINUED] metoprolol succinate (TOPROL-XL) 25 MG 24 hr tablet, Take 1 tablet (25 mg total) by mouth daily., Disp: 30 tablet, Rfl: 3  Allergies  Allergen Reactions  . Ace Inhibitors Cough  . Amoxicillin-Pot Clavulanate Nausea And Vomiting  . Codeine Nausea And Vomiting  . Penicillins Nausea And Vomiting and Other (See Comments)    Has patient had a PCN reaction causing immediate rash, facial/tongue/throat swelling, SOB or lightheadedness with hypotension: No Has patient had a PCN reaction causing severe rash involving mucus membranes or skin necrosis: No Has patient had a PCN reaction that required hospitalization No Has patient had a PCN reaction occurring within the last 10 years: No If all of the above answers are "NO", then may proceed with Cephalosporin use.  . Rosuvastatin Other (See Comments)    Reaction:  Joint pain   . Tetanus-Diphtheria Toxoids Td Swelling     ROS  Constitutional: Negative for fever or weight change.  Respiratory: Negative for cough and shortness of breath.   Cardiovascular: Negative for chest pain or palpitations.  Gastrointestinal: Negative for abdominal pain, no bowel changes.  Musculoskeletal: Negative for gait problem or joint swelling.  Skin: Negative for rash.  Neurological: Intermittent for dizziness no  headache.  No other specific complaints in a complete review of systems (except as listed in HPI above). Objective  Filed Vitals:   01/14/16 1442   BP: 126/72  Pulse: 73  Temp: 97.7 F (36.5 C)  TempSrc: Oral  Resp: 16  Height: 5\' 4"  (1.626 m)  Weight: 178 lb 11.2 oz (81.058 kg)  SpO2: 97%    Body mass index is 30.66 kg/(m^2).  Physical Exam  Constitutional: Patient appears well-developed and well-nourished. Obese No distress.  HEENT: head atraumatic, normocephalic, pupils equal and reactive to light, right TM shows scar tissue, no nystagmus, neck supple, throat within normal limits Cardiovascular: Irregular  Rhythm , systolic ejection murmur 3/6  No BLE edema. Pulmonary/Chest: Effort normal and breath sounds normal. No respiratory distress. Abdominal: Soft. There is no tenderness. Psychiatric: Patient has a normal mood and affect. behavior is normal. Judgment and thought content normal. Neurological : Awake, alert and oriented. No paresthesia, Romberg negative. Cranial nerve intact   PHQ2/9: Depression screen [facility] 2/9 01/14/2016 01/10/2016 10/08/2015 06/06/2015 02/09/2015  Decreased Interest 0 0 0 0 0  Down, Depressed, Hopeless 0 0 0 0 2  PHQ - 2 Score 0 0 0 0 2  Altered sleeping - - - - 0  Tired, decreased energy - - - - 0  Change in appetite - - - - 1  Feeling bad or failure about yourself  - - - - 1  Trouble concentrating - - - - 0  Moving slowly or fidgety/restless - - - - 0  Suicidal thoughts - - - - 0  PHQ-9 Score - - - - 4  Difficult doing work/chores - - - - Somewhat difficult     Fall Risk: Fall Risk  01/14/2016 01/10/2016 10/08/2015 06/06/2015 02/09/2015  Falls in the past year? No No No No No     Functional Status Survey: Is the patient deaf or have difficulty hearing?: No Does the patient have difficulty seeing, even when wearing glasses/contacts?: No Does the patient have difficulty concentrating, remembering, or making decisions?: No Does the patient have difficulty walking or climbing stairs?: No Does the patient have difficulty dressing or bathing?: No Does the patient have difficulty doing errands alone  such as visiting a doctor's office or shopping?: No    Assessment & Plan  1. Ear fullness, right  Doing better but still has intermittent episodes of vertigo, needs to go see ENT  2.  Vertigo  See above  3. Migraine with aura and without status migrainosus, not intractable  Episodes are intermittent, usually triggered by skipping meals  4. Chronic obstructive pulmonary disease with acute lower respiratory infection (HCC)  - tiotropium (SPIRIVA HANDIHALER) 18 MCG inhalation capsule; INHALE 1 CAPSULE VIA HANDIHALER ONCE DAILY AT THE SAME TIME EVERY DAY  Dispense: 30 capsule; Refill: 3  5. Dysmetabolic syndrome  - Hemoglobin A1c  6. Benign hypertension  - Comprehensive metabolic panel  7. Paroxysmal atrial fibrillation (HCC)  - Comprehensive metabolic panel - metoprolol tartrate (LOPRESSOR) 25 MG tablet; Take 1 tablet (25 mg total) by mouth 2 (two) times daily.  Dispense: 60 tablet; Refill: 3 - digoxin level  8. Dyslipidemia  - check lipid panel   9. Hyperglycemia  - Hemoglobin A1c  10. Vaginal dryness, menopausal  - conjugated estrogens (PREMARIN) vaginal cream; Place 1 Applicatorful vaginally at bedtime. For two weeks after that use two times weekly  Dispense: 42.5 g; Refill: 12

## 2016-01-17 DIAGNOSIS — I480 Paroxysmal atrial fibrillation: Secondary | ICD-10-CM | POA: Diagnosis not present

## 2016-01-17 DIAGNOSIS — E8881 Metabolic syndrome: Secondary | ICD-10-CM | POA: Diagnosis not present

## 2016-01-17 DIAGNOSIS — I10 Essential (primary) hypertension: Secondary | ICD-10-CM | POA: Diagnosis not present

## 2016-01-17 DIAGNOSIS — Z79899 Other long term (current) drug therapy: Secondary | ICD-10-CM | POA: Diagnosis not present

## 2016-01-17 DIAGNOSIS — E785 Hyperlipidemia, unspecified: Secondary | ICD-10-CM | POA: Diagnosis not present

## 2016-01-17 DIAGNOSIS — R739 Hyperglycemia, unspecified: Secondary | ICD-10-CM | POA: Diagnosis not present

## 2016-01-18 LAB — COMPREHENSIVE METABOLIC PANEL
A/G RATIO: 1.5 (ref 1.2–2.2)
ALT: 19 IU/L (ref 0–32)
AST: 26 IU/L (ref 0–40)
Albumin: 4.2 g/dL (ref 3.5–4.8)
Alkaline Phosphatase: 80 IU/L (ref 39–117)
BILIRUBIN TOTAL: 0.5 mg/dL (ref 0.0–1.2)
BUN/Creatinine Ratio: 28 (ref 12–28)
BUN: 23 mg/dL (ref 8–27)
CALCIUM: 9.2 mg/dL (ref 8.7–10.3)
CHLORIDE: 98 mmol/L (ref 96–106)
CO2: 27 mmol/L (ref 18–29)
Creatinine, Ser: 0.83 mg/dL (ref 0.57–1.00)
GFR calc Af Amer: 81 mL/min/{1.73_m2} (ref >59)
GFR calc non Af Amer: 71 mL/min/{1.73_m2} (ref >59)
GLUCOSE: 98 mg/dL (ref 65–99)
Globulin, Total: 2.8 g/dL (ref 1.5–4.5)
POTASSIUM: 3.4 mmol/L — AB (ref 3.5–5.2)
Sodium: 142 mmol/L (ref 134–144)
TOTAL PROTEIN: 7 g/dL (ref 6.0–8.5)

## 2016-01-18 LAB — LIPID PANEL
CHOL/HDL RATIO: 7 ratio — AB (ref 0.0–4.4)
CHOLESTEROL TOTAL: 286 mg/dL — AB (ref 100–199)
HDL: 41 mg/dL (ref >39)
LDL CALC: 202 mg/dL — AB (ref 0–99)
TRIGLYCERIDES: 216 mg/dL — AB (ref 0–149)
VLDL Cholesterol Cal: 43 mg/dL — ABNORMAL HIGH (ref 5–40)

## 2016-01-18 LAB — HEMOGLOBIN A1C
ESTIMATED AVERAGE GLUCOSE: 120 mg/dL
HEMOGLOBIN A1C: 5.8 % — AB (ref 4.8–5.6)

## 2016-01-31 DIAGNOSIS — I480 Paroxysmal atrial fibrillation: Secondary | ICD-10-CM | POA: Diagnosis not present

## 2016-02-14 ENCOUNTER — Ambulatory Visit (INDEPENDENT_AMBULATORY_CARE_PROVIDER_SITE_OTHER): Payer: Medicare Other | Admitting: Family Medicine

## 2016-02-14 ENCOUNTER — Encounter: Payer: Self-pay | Admitting: Family Medicine

## 2016-02-14 ENCOUNTER — Other Ambulatory Visit: Payer: Self-pay | Admitting: Family Medicine

## 2016-02-14 VITALS — BP 134/86 | HR 82 | Temp 97.4°F | Resp 16 | Ht 64.0 in | Wt 175.6 lb

## 2016-02-14 DIAGNOSIS — N898 Other specified noninflammatory disorders of vagina: Secondary | ICD-10-CM | POA: Diagnosis not present

## 2016-02-14 DIAGNOSIS — R300 Dysuria: Secondary | ICD-10-CM

## 2016-02-14 LAB — POCT URINALYSIS DIPSTICK
BILIRUBIN UA: NEGATIVE
Glucose, UA: NEGATIVE
KETONES UA: NEGATIVE
Nitrite, UA: NEGATIVE
PH UA: 7.5
Protein, UA: 30
Urobilinogen, UA: 0.2

## 2016-02-14 MED ORDER — FLUCONAZOLE 150 MG PO TABS: 150.0000 mg | ORAL_TABLET | Freq: Once | ORAL | Status: DC

## 2016-02-14 MED ORDER — METRONIDAZOLE 500 MG PO TABS: 2000.0000 mg | ORAL_TABLET | Freq: Every day | ORAL | Status: DC

## 2016-02-14 NOTE — Progress Notes (Signed)
Name: [name]   MRN: [id number]    DOB: 05-13-1941   Date:02/14/2016       Progress Note  Subjective  Chief Complaint  Chief Complaint  Patient presents with  . Vaginal Discharge    patient stated that she has been having some yellow dsicharge with burning and pain. she has tried [employer] with no relief.    HPI  Vaginal discharge: she has a new sexual partner for the past month, had intercourse twice, no condoms, she developed vaginal pruritus, followed by watery discharge for the past 2 weeks. No vaginal odor. She is status post-hysterectomy. She has tried monistat that improved the pruritis slightly but did not help with discharge.    Patient Active Problem List   Diagnosis Date Noted  . Chronic systolic heart failure ([place]) 10/08/2015  . Chronic obstructive pulmonary disease ([place]) 07/04/2015  . Controlled gout 06/06/2015  . Anxiety 02/09/2015  . Synovial cyst of popliteal space 02/09/2015  . Benign hypertension 02/09/2015  . Blood type A+ 02/09/2015  . Arterial branch occlusion of retina 02/09/2015  . Chronic constipation 02/09/2015  . Insomnia, persistent 02/09/2015  . Chronic combined systolic and diastolic CHF, NYHA class 1 ([place]) 02/09/2015  . Compromised kidney function 02/09/2015  . Dyslipidemia 02/09/2015  . Atrial fibrillation ([place]) 02/09/2015  . Arthritis urica 02/09/2015  . Hearing loss 02/09/2015  . History of open heart surgery 02/09/2015  . Chronic hoarseness 02/09/2015  . Cardiomegaly 02/09/2015  . Dysmetabolic syndrome 0000000  . Migraine with aura and without status migrainosus, not intractable 02/09/2015  . Adult BMI 30+ 02/09/2015  . Hypertensive pulmonary vascular disease ([place]) 02/09/2015  . Allergic rhinitis, seasonal 02/09/2015  . Vitamin D deficiency 02/09/2015  . History of mitral valve repair 10/01/2012  . History of aortic valve replacement 10/01/2012  . Congestive heart failure ([place]) 10/01/2012    Past Surgical History   Procedure Laterality Date  . Abdominal hysterectomy  1996  . Cardiac valve replacement  2014    Family History  Problem Relation Age of Onset  . Cancer Mother   . Diabetes Mother   . Hypertension Father   . Hypertension Mother   . Heart failure Father   . Cancer Brother     bladder  . Cancer Sister     breast  . Hypertension Son   . Hypertension Daughter     Social History   Social History  . Marital Status: Widowed    Spouse Name: N/A  . Number of Children: 2  . Years of Education: N/A   Occupational History  . Custodian    Social History Main Topics  . Smoking status: Former Smoker    Quit date: 09/09/2007  . Smokeless tobacco: Never Used  . Alcohol Use: No  . Drug Use: No  . Sexual Activity: Not Currently   Other Topics Concern  . Not on file   Social History Narrative     Current outpatient prescriptions:  .  acetaminophen (TYLENOL) 500 MG tablet, Take 1,000-1,500 mg by mouth every 6 (six) hours as needed for mild pain., Disp: , Rfl:  .  allopurinol (ZYLOPRIM) 300 MG tablet, TAKE 1 TABLET BY MOUTH DAILY, Disp: 90 tablet, Rfl: 1 .  aspirin EC 81 MG tablet, Take 81 mg by mouth every evening., Disp: , Rfl:  .  atorvastatin (LIPITOR) 80 MG tablet, Take 1 tablet (80 mg total) by mouth daily., Disp: 30 tablet, Rfl: 3 .  COLCRYS 0.6 MG tablet, TAKE 2  TABS BY MOUTH AT ONSET OF PAIN. REPEAT WITH 1 TAB IN 1 HOUR. *MAX 3TABS/24 HOURS*, Disp: 30 tablet, Rfl: 1 .  conjugated estrogens (PREMARIN) vaginal cream, Place 1 Applicatorful vaginally at bedtime. For two weeks after that use two times weekly, Disp: 42.5 g, Rfl: 12 .  diazepam (VALIUM) 5 MG tablet, TAKE 1 TABLET BY MOUTH AS NEEDED, Disp: 30 tablet, Rfl: 0 .  digoxin (LANOXIN) 0.125 MG tablet, TAKE 1 TABLET (0.125 MG TOTAL) BY MOUTH ONCE DAILY., Disp: , Rfl: 6 .  fluconazole (DIFLUCAN) 150 MG tablet, Take 1 tablet (150 mg total) by mouth once., Disp: 1 tablet, Rfl: 0 .  fluticasone (FLONASE) 50 MCG/ACT nasal  spray, Place 2 sprays into both nostrils daily., Disp: 16 g, Rfl: 6 .  KLOR-CON M20 20 MEQ tablet, TAKE 1 TABLET BY MOUTH ONCE A DAY, Disp: 90 tablet, Rfl: 1 .  loratadine (CLARITIN) 10 MG tablet, Take 10 mg by mouth daily as needed for allergies., Disp: , Rfl:  .  meclizine (ANTIVERT) 12.5 MG tablet, Take 1 tablet (12.5 mg total) by mouth 3 (three) times daily as needed for dizziness., Disp: 30 tablet, Rfl: 0 .  metoprolol tartrate (LOPRESSOR) 25 MG tablet, Take 1 tablet (25 mg total) by mouth 2 (two) times daily., Disp: 60 tablet, Rfl: 3 .  metroNIDAZOLE (FLAGYL) 500 MG tablet, Take 4 tablets (2,000 mg total) by mouth daily., Disp: 4 tablet, Rfl: 0 .  promethazine (PHENERGAN) 12.5 MG tablet, Take 1 tablet (12.5 mg total) by mouth every 8 (eight) hours as needed for nausea or vomiting., Disp: 20 tablet, Rfl: 0 .  tiotropium (SPIRIVA HANDIHALER) 18 MCG inhalation capsule, INHALE 1 CAPSULE VIA HANDIHALER ONCE DAILY AT THE SAME TIME EVERY DAY, Disp: 30 capsule, Rfl: 3 .  torsemide (DEMADEX) 20 MG tablet, TAKE 1 TABLET (20 MG TOTAL) BY MOUTH DAILY., Disp: 30 tablet, Rfl: 2 .  traZODone (DESYREL) 50 MG tablet, Take 50-100 mg by mouth at bedtime as needed for sleep., Disp: , Rfl:  .  Vitamin D, Ergocalciferol, (DRISDOL) 50000 units CAPS capsule, TAKE 1 CAPSULE (50,000 UNITS TOTAL) BY MOUTH EVERY 7 (SEVEN) DAYS., Disp: 12 capsule, Rfl: 1 .  warfarin (COUMADIN) 5 MG tablet, Take 5-7.5 mg by mouth every evening. Pt takes one tablet Sunday, Monday, Wednesday, and Friday.  Pt takes one and one-half tablet on Tuesday, Thursday, and Saturday., Disp: , Rfl:  .  [DISCONTINUED] metoprolol succinate (TOPROL-XL) 25 MG 24 hr tablet, Take 1 tablet (25 mg total) by mouth daily., Disp: 30 tablet, Rfl: 3  Allergies  Allergen Reactions  . Ace Inhibitors Cough  . Amoxicillin-Pot Clavulanate Nausea And Vomiting  . Codeine Nausea And Vomiting  . Penicillins Nausea And Vomiting and Other (See Comments)    Has patient had a  PCN reaction causing immediate rash, facial/tongue/throat swelling, SOB or lightheadedness with hypotension: No Has patient had a PCN reaction causing severe rash involving mucus membranes or skin necrosis: No Has patient had a PCN reaction that required hospitalization No Has patient had a PCN reaction occurring within the last 10 years: No If all of the above answers are "NO", then may proceed with Cephalosporin use.  . Rosuvastatin Other (See Comments)    Reaction:  Joint pain   . Tetanus-Diphtheria Toxoids Td Swelling     ROS  Ten systems reviewed and is negative except as mentioned in HPI   Objective  Filed Vitals:   02/14/16 0929  BP: 134/86  Pulse: 82  Temp: 97.4  F (36.3 C)  TempSrc: Oral  Resp: 16  Height: 5\' 4"  (1.626 m)  Weight: 175 lb 9.6 oz (79.652 kg)  SpO2: 97%    Body mass index is 30.13 kg/(m^2).  Physical Exam  Constitutional: Patient appears well-developed and well-nourished. Obese No distress.  HEENT: head atraumatic, normocephalic, pupils equal and reactive to light,  neck supple, throat within normal limits Cardiovascular: Irregular rate, 4/6 systolic ejection murmur.  No BLE edema. Pulmonary/Chest: Effort normal and breath sounds normal. No respiratory distress. Abdominal: Soft.  There is no tenderness. GYN: not done Psychiatric: Patient has a normal mood and affect. behavior is normal. Judgment and thought content normal.  Recent Results (from the past 2160 hour(s))  Hemoglobin A1c     Status: Abnormal   Collection Time: 01/17/16  8:26 AM  Result Value Ref Range   Hgb A1c MFr Bld 5.8 (H) 4.8 - 5.6 %    Comment:          Pre-diabetes: 5.7 - 6.4          Diabetes: >6.4          Glycemic control for adults with diabetes: <7.0    Est. average glucose Bld gHb Est-mCnc 120 mg/dL  Comprehensive metabolic panel     Status: Abnormal   Collection Time: 01/17/16  8:26 AM  Result Value Ref Range   Glucose 98 65 - 99 mg/dL   BUN 23 8 - 27 mg/dL    Creatinine, Ser 0.83 0.57 - 1.00 mg/dL   GFR calc non Af Amer 71 >59 mL/min/1.73   GFR calc Af Amer 81 >59 mL/min/1.73   BUN/Creatinine Ratio 28 12 - 28   Sodium 142 134 - 144 mmol/L   Potassium 3.4 (L) 3.5 - 5.2 mmol/L   Chloride 98 96 - 106 mmol/L   CO2 27 18 - 29 mmol/L   Calcium 9.2 8.7 - 10.3 mg/dL   Total Protein 7.0 6.0 - 8.5 g/dL   Albumin 4.2 3.5 - 4.8 g/dL   Globulin, Total 2.8 1.5 - 4.5 g/dL   Albumin/Globulin Ratio 1.5 1.2 - 2.2   Bilirubin Total 0.5 0.0 - 1.2 mg/dL   Alkaline Phosphatase 80 39 - 117 IU/L   AST 26 0 - 40 IU/L   ALT 19 0 - 32 IU/L  Lipid panel     Status: Abnormal   Collection Time: 01/17/16  8:26 AM  Result Value Ref Range   Cholesterol, Total 286 (H) 100 - 199 mg/dL   Triglycerides 216 (H) 0 - 149 mg/dL   HDL 41 >39 mg/dL   VLDL Cholesterol Cal 43 (H) 5 - 40 mg/dL   LDL Calculated 202 (H) 0 - 99 mg/dL   Chol/HDL Ratio 7.0 (H) 0.0 - 4.4 ratio units    Comment:                                   T. Chol/HDL Ratio                                             Men  Women                               1/2 Avg.Risk  3.4    3.3  Avg.Risk  5.0    4.4                                2X Avg.Risk  9.6    7.1                                3X Avg.Risk 23.4   11.0   POCT urinalysis dipstick     Status: Abnormal   Collection Time: 02/14/16  9:33 AM  Result Value Ref Range   Color, UA dark yellow    Clarity, UA cloudy    Glucose, UA neg    Bilirubin, UA neg    Ketones, UA neg    Spec Grav, UA <=1.005    Blood, UA Large    pH, UA 7.5    Protein, UA 30    Urobilinogen, UA 0.2    Nitrite, UA neg    Leukocytes, UA large (3+) (A) Negative     PHQ2/9: Depression screen [facility] 2/9 02/14/2016 01/14/2016 01/10/2016 10/08/2015 06/06/2015  Decreased Interest 0 0 0 0 0  Down, Depressed, Hopeless 0 0 0 0 0  PHQ - 2 Score 0 0 0 0 0  Altered sleeping - - - - -  Tired, decreased energy - - - - -  Change in appetite - - - - -  Feeling bad or  failure about yourself  - - - - -  Trouble concentrating - - - - -  Moving slowly or fidgety/restless - - - - -  Suicidal thoughts - - - - -  PHQ-9 Score - - - - -  Difficult doing work/chores - - - - -    Fall Risk: Fall Risk  02/14/2016 01/14/2016 01/10/2016 10/08/2015 06/06/2015  Falls in the past year? No No No No No    Functional Status Survey: Is the patient deaf or have difficulty hearing?: No Does the patient have difficulty seeing, even when wearing glasses/contacts?: No Does the patient have difficulty concentrating, remembering, or making decisions?: No Does the patient have difficulty walking or climbing stairs?: No Does the patient have difficulty dressing or bathing?: No Does the patient have difficulty doing errands alone such as visiting a doctor's office or shopping?: No    Assessment & Plan  1. Burning with urination  - POCT urinalysis dipstick - Urine Culture; Future - Chlamydia/Gonococcus/Trichomonas, NAA  2. Vaginal discharge  - Urine Culture; Future - Chlamydia/Gonococcus/Trichomonas, NAA - fluconazole (DIFLUCAN) 150 MG tablet; Take 1 tablet (150 mg total) by mouth once.  Dispense: 1 tablet; Refill: 0 - metroNIDAZOLE (FLAGYL) 500 MG tablet; Take 4 tablets (2,000 mg total) by mouth daily.  Dispense: 4 tablet; Refill: 0  We will treat empirically, discussed checking for other STI but she would like to hold off for now Discussed that medication may affect coumadin levels and needs to watch for bleeding

## 2016-02-18 LAB — URINE CULTURE

## 2016-02-18 LAB — CHLAMYDIA/GONOCOCCUS/TRICHOMONAS, NAA
Chlamydia by NAA: NEGATIVE
Gonococcus by NAA: NEGATIVE
Trich vag by NAA: POSITIVE — AB

## 2016-02-28 DIAGNOSIS — I480 Paroxysmal atrial fibrillation: Secondary | ICD-10-CM | POA: Diagnosis not present

## 2016-03-04 DIAGNOSIS — Z952 Presence of prosthetic heart valve: Secondary | ICD-10-CM | POA: Diagnosis not present

## 2016-03-04 DIAGNOSIS — R011 Cardiac murmur, unspecified: Secondary | ICD-10-CM | POA: Diagnosis not present

## 2016-03-04 DIAGNOSIS — J45909 Unspecified asthma, uncomplicated: Secondary | ICD-10-CM | POA: Diagnosis not present

## 2016-03-04 DIAGNOSIS — Z9889 Other specified postprocedural states: Secondary | ICD-10-CM | POA: Diagnosis not present

## 2016-03-04 DIAGNOSIS — M1A00X0 Idiopathic chronic gout, unspecified site, without tophus (tophi): Secondary | ICD-10-CM | POA: Diagnosis not present

## 2016-03-04 DIAGNOSIS — R000 Tachycardia, unspecified: Secondary | ICD-10-CM | POA: Diagnosis not present

## 2016-03-04 DIAGNOSIS — E669 Obesity, unspecified: Secondary | ICD-10-CM | POA: Diagnosis not present

## 2016-03-04 DIAGNOSIS — R0602 Shortness of breath: Secondary | ICD-10-CM | POA: Diagnosis not present

## 2016-03-04 DIAGNOSIS — D689 Coagulation defect, unspecified: Secondary | ICD-10-CM | POA: Diagnosis not present

## 2016-03-04 DIAGNOSIS — T45511A Poisoning by anticoagulants, accidental (unintentional), initial encounter: Secondary | ICD-10-CM | POA: Diagnosis not present

## 2016-03-04 DIAGNOSIS — I480 Paroxysmal atrial fibrillation: Secondary | ICD-10-CM | POA: Diagnosis not present

## 2016-03-04 DIAGNOSIS — E784 Other hyperlipidemia: Secondary | ICD-10-CM | POA: Diagnosis not present

## 2016-03-07 ENCOUNTER — Telehealth: Payer: Self-pay | Admitting: *Deleted

## 2016-03-07 NOTE — Telephone Encounter (Addendum)
Wrong chart

## 2016-03-20 DIAGNOSIS — I4891 Unspecified atrial fibrillation: Secondary | ICD-10-CM | POA: Diagnosis not present

## 2016-03-23 ENCOUNTER — Other Ambulatory Visit: Payer: Self-pay | Admitting: Family Medicine

## 2016-03-24 NOTE — Telephone Encounter (Signed)
Prescription called in to pharmacy

## 2016-03-26 ENCOUNTER — Ambulatory Visit: Payer: Medicare Other | Admitting: Family Medicine

## 2016-03-31 ENCOUNTER — Other Ambulatory Visit: Payer: Self-pay | Admitting: Family Medicine

## 2016-04-15 ENCOUNTER — Ambulatory Visit (INDEPENDENT_AMBULATORY_CARE_PROVIDER_SITE_OTHER): Payer: Medicare Other | Admitting: Family Medicine

## 2016-04-15 ENCOUNTER — Encounter: Payer: Self-pay | Admitting: Family Medicine

## 2016-04-15 VITALS — BP 122/68 | HR 70 | Temp 98.0°F | Resp 18 | Ht 64.0 in | Wt 182.5 lb

## 2016-04-15 DIAGNOSIS — M7072 Other bursitis of hip, left hip: Secondary | ICD-10-CM

## 2016-04-15 DIAGNOSIS — Z5181 Encounter for therapeutic drug level monitoring: Secondary | ICD-10-CM

## 2016-04-15 DIAGNOSIS — A599 Trichomoniasis, unspecified: Secondary | ICD-10-CM | POA: Diagnosis not present

## 2016-04-15 DIAGNOSIS — N390 Urinary tract infection, site not specified: Secondary | ICD-10-CM | POA: Diagnosis not present

## 2016-04-15 DIAGNOSIS — Z7901 Long term (current) use of anticoagulants: Secondary | ICD-10-CM | POA: Diagnosis not present

## 2016-04-15 DIAGNOSIS — M7701 Medial epicondylitis, right elbow: Secondary | ICD-10-CM

## 2016-04-15 NOTE — Progress Notes (Signed)
Name: [name]   MRN: [id number]    DOB: 03-25-1941   Date:04/15/2016       Progress Note  Subjective  Chief Complaint  Chief Complaint  Patient presents with  . Hip Pain  . Elbow Pain    HPI  UTI follow up: she was treated with trichomonas and UTI back in June, symptoms resolved, no longer has vaginal discharge or dysuria. She would like to hold off on checking for trichomonas but we will send urine for culture today  Trochanteric bursitis: she has noticed pain when laying on left lateral decubitus or when applying pressure the area. She has good rom of both hips. No radiculitis. She can't tolerate steroids of NSAID's because she takes coumadin and afib was triggered by oral prednisone in the past.   Medial epicondylitis: she has noticed pain and swelling on right medial elbow, going on for the past year. Initially stopped lifting heavy objects. She states pain only occurs with pressure , no longer has pain when lifting objects. No redness or increase warmth, mild swelling on the area.    Patient Active Problem List   Diagnosis Date Noted  . Chronic systolic heart failure ([place]) 10/08/2015  . Chronic obstructive pulmonary disease ([place]) 07/04/2015  . Controlled gout 06/06/2015  . Anxiety 02/09/2015  . Synovial cyst of popliteal space 02/09/2015  . Benign hypertension 02/09/2015  . Blood type A+ 02/09/2015  . Arterial branch occlusion of retina 02/09/2015  . Chronic constipation 02/09/2015  . Insomnia, persistent 02/09/2015  . Chronic combined systolic and diastolic CHF, NYHA class 1 ([place]) 02/09/2015  . Compromised kidney function 02/09/2015  . Dyslipidemia 02/09/2015  . Atrial fibrillation ([place]) 02/09/2015  . Arthritis urica 02/09/2015  . Hearing loss 02/09/2015  . History of open heart surgery 02/09/2015  . Chronic hoarseness 02/09/2015  . Cardiomegaly 02/09/2015  . Dysmetabolic syndrome 0000000  . Migraine with aura and without status migrainosus, not intractable  02/09/2015  . Adult BMI 30+ 02/09/2015  . Hypertensive pulmonary vascular disease ([place]) 02/09/2015  . Allergic rhinitis, seasonal 02/09/2015  . Vitamin D deficiency 02/09/2015  . History of mitral valve repair 10/01/2012  . History of aortic valve replacement 10/01/2012  . Congestive heart failure ([place]) 10/01/2012    Past Surgical History:  Procedure Laterality Date  . ABDOMINAL HYSTERECTOMY  1996  . CARDIAC VALVE REPLACEMENT  2014    Family History  Problem Relation Age of Onset  . Cancer Mother   . Diabetes Mother   . Hypertension Father   . Hypertension Mother   . Heart failure Father   . Cancer Brother     bladder  . Cancer Sister     breast  . Hypertension Son   . Hypertension Daughter     Social History   Social History  . Marital status: Widowed    Spouse name: N/A  . Number of children: 2  . Years of education: N/A   Occupational History  . Custodian    Social History Main Topics  . Smoking status: Former Smoker    Quit date: 09/09/2007  . Smokeless tobacco: Never Used  . Alcohol use No  . Drug use: No  . Sexual activity: Not Currently   Other Topics Concern  . Not on file   Social History Narrative  . No narrative on file     Current Outpatient Prescriptions:  .  acetaminophen (TYLENOL) 500 MG tablet, Take 1,000-1,500 mg by mouth every 6 (six) hours as needed for  mild pain., Disp: , Rfl:  .  allopurinol (ZYLOPRIM) 300 MG tablet, TAKE 1 TABLET BY MOUTH DAILY, Disp: 90 tablet, Rfl: 1 .  aspirin EC 81 MG tablet, Take 81 mg by mouth every evening., Disp: , Rfl:  .  atorvastatin (LIPITOR) 80 MG tablet, Take 1 tablet (80 mg total) by mouth daily., Disp: 30 tablet, Rfl: 3 .  COLCRYS 0.6 MG tablet, TAKE 2 TABS BY MOUTH AT ONSET OF PAIN. REPEAT WITH 1 TAB IN 1 HOUR. *MAX 3TABS/24 HOURS*, Disp: 30 tablet, Rfl: 1 .  conjugated estrogens (PREMARIN) vaginal cream, Place 1 Applicatorful vaginally at bedtime. For two weeks after that use two times weekly, Disp:  42.5 g, Rfl: 12 .  diazepam (VALIUM) 5 MG tablet, TAKE 1 TABLET BY MOUTH AS NEEDED, Disp: 30 tablet, Rfl: 0 .  digoxin (LANOXIN) 0.125 MG tablet, TAKE 1 TABLET (0.125 MG TOTAL) BY MOUTH ONCE DAILY., Disp: , Rfl: 6 .  fluconazole (DIFLUCAN) 150 MG tablet, Take 1 tablet (150 mg total) by mouth once., Disp: 1 tablet, Rfl: 0 .  fluticasone (FLONASE) 50 MCG/ACT nasal spray, Place 2 sprays into both nostrils daily., Disp: 16 g, Rfl: 6 .  KLOR-CON M20 20 MEQ tablet, TAKE 1 TABLET BY MOUTH ONCE A DAY, Disp: 90 tablet, Rfl: 1 .  loratadine (CLARITIN) 10 MG tablet, Take 10 mg by mouth daily as needed for allergies., Disp: , Rfl:  .  meclizine (ANTIVERT) 12.5 MG tablet, Take 1 tablet (12.5 mg total) by mouth 3 (three) times daily as needed for dizziness., Disp: 30 tablet, Rfl: 0 .  metoprolol tartrate (LOPRESSOR) 25 MG tablet, Take 1 tablet (25 mg total) by mouth 2 (two) times daily., Disp: 60 tablet, Rfl: 3 .  promethazine (PHENERGAN) 12.5 MG tablet, Take 1 tablet (12.5 mg total) by mouth every 8 (eight) hours as needed for nausea or vomiting., Disp: 20 tablet, Rfl: 0 .  tiotropium (SPIRIVA HANDIHALER) 18 MCG inhalation capsule, INHALE 1 CAPSULE VIA HANDIHALER ONCE DAILY AT THE SAME TIME EVERY DAY, Disp: 30 capsule, Rfl: 3 .  torsemide (DEMADEX) 20 MG tablet, TAKE 1 TABLET (20 MG TOTAL) BY MOUTH DAILY., Disp: 30 tablet, Rfl: 2 .  traZODone (DESYREL) 50 MG tablet, TAKE 1 TO 2 TABLETS BY MOUTH AT BEDTIME, Disp: 60 tablet, Rfl: 0 .  Vitamin D, Ergocalciferol, (DRISDOL) 50000 units CAPS capsule, TAKE 1 CAPSULE (50,000 UNITS TOTAL) BY MOUTH EVERY 7 (SEVEN) DAYS., Disp: 12 capsule, Rfl: 1 .  warfarin (COUMADIN) 5 MG tablet, Take 5-7.5 mg by mouth every evening. Pt takes one tablet Sunday, Monday, Wednesday, and Friday.  Pt takes one and one-half tablet on Tuesday, Thursday, and Saturday., Disp: , Rfl:   Allergies  Allergen Reactions  . Ace Inhibitors Cough  . Amoxicillin-Pot Clavulanate Nausea And Vomiting  .  Codeine Nausea And Vomiting  . Penicillins Nausea And Vomiting and Other (See Comments)    Has patient had a PCN reaction causing immediate rash, facial/tongue/throat swelling, SOB or lightheadedness with hypotension: No Has patient had a PCN reaction causing severe rash involving mucus membranes or skin necrosis: No Has patient had a PCN reaction that required hospitalization No Has patient had a PCN reaction occurring within the last 10 years: No If all of the above answers are "NO", then may proceed with Cephalosporin use.  . Rosuvastatin Other (See Comments)    Reaction:  Joint pain   . Tetanus-Diphtheria Toxoids Td Swelling     ROS  Ten systems reviewed and is negative  except as mentioned in HPI  Mild weight gain  Objective  Vitals:   04/15/16 1349  BP: 122/68  Pulse: 70  Resp: 18  Temp: 98 F (36.7 C)  SpO2: 97%  Weight: 182 lb 8 oz (82.8 kg)  Height: 5\' 4"  (1.626 m)    Body mass index is 31.33 kg/m.  Physical Exam  Constitutional: Patient appears well-developed and well-nourished. Obese No distress.  HEENT: head atraumatic, normocephalic, pupils equal and reactive to light,  neck supple, throat within normal limits Cardiovascular: Irregular rate, 4/6 systolic ejection murmur.  No BLE edema. Pulmonary/Chest: Effort normal and breath sounds normal. No respiratory distress. Abdominal: Soft.  There is no tenderness. GYN: not done Psychiatric: Patient has a normal mood and affect. behavior is normal. Judgment and thought content normal. Muscular Skeletal: she has normal rom of both hips, pain during palpation of right trochanteric bursa, pain and mild swelling on right medial epicondyle.   Recent Results (from the past 2160 hour(s))  Hemoglobin A1c     Status: Abnormal   Collection Time: 01/17/16  8:26 AM  Result Value Ref Range   Hgb A1c MFr Bld 5.8 (H) 4.8 - 5.6 %    Comment:          Pre-diabetes: 5.7 - 6.4          Diabetes: >6.4          Glycemic control  for adults with diabetes: <7.0    Est. average glucose Bld gHb Est-mCnc 120 mg/dL  Comprehensive metabolic panel     Status: Abnormal   Collection Time: 01/17/16  8:26 AM  Result Value Ref Range   Glucose 98 65 - 99 mg/dL   BUN 23 8 - 27 mg/dL   Creatinine, Ser 0.83 0.57 - 1.00 mg/dL   GFR calc non Af Amer 71 >59 mL/min/1.73   GFR calc Af Amer 81 >59 mL/min/1.73   BUN/Creatinine Ratio 28 12 - 28   Sodium 142 134 - 144 mmol/L   Potassium 3.4 (L) 3.5 - 5.2 mmol/L   Chloride 98 96 - 106 mmol/L   CO2 27 18 - 29 mmol/L   Calcium 9.2 8.7 - 10.3 mg/dL   Total Protein 7.0 6.0 - 8.5 g/dL   Albumin 4.2 3.5 - 4.8 g/dL   Globulin, Total 2.8 1.5 - 4.5 g/dL   Albumin/Globulin Ratio 1.5 1.2 - 2.2   Bilirubin Total 0.5 0.0 - 1.2 mg/dL   Alkaline Phosphatase 80 39 - 117 IU/L   AST 26 0 - 40 IU/L   ALT 19 0 - 32 IU/L  Lipid panel     Status: Abnormal   Collection Time: 01/17/16  8:26 AM  Result Value Ref Range   Cholesterol, Total 286 (H) 100 - 199 mg/dL   Triglycerides 216 (H) 0 - 149 mg/dL   HDL 41 >39 mg/dL   VLDL Cholesterol Cal 43 (H) 5 - 40 mg/dL   LDL Calculated 202 (H) 0 - 99 mg/dL   Chol/HDL Ratio 7.0 (H) 0.0 - 4.4 ratio units    Comment:                                   T. Chol/HDL Ratio  Men  Women                               1/2 Avg.Risk  3.4    3.3                                   Avg.Risk  5.0    4.4                                2X Avg.Risk  9.6    7.1                                3X Avg.Risk 23.4   11.0   Chlamydia/Gonococcus/Trichomonas, NAA     Status: Abnormal   Collection Time: 02/14/16 12:00 AM  Result Value Ref Range   Chlamydia by NAA Negative Negative   Gonococcus by NAA Negative Negative   Trich vag by NAA Positive (A) Negative  Urine culture     Status: Abnormal   Collection Time: 02/14/16 12:00 AM  Result Value Ref Range   Urine Culture, Routine Final report (A)    Urine Culture result 1 Comment (A)      Comment: Beta hemolytic Streptococcus, group B 25,000-50,000 colony forming units per mL Penicillin and ampicillin are drugs of choice for treatment of beta-hemolytic streptococcal infections. Susceptibility testing of penicillins and other beta-lactam agents approved by the FDA for treatment of beta-hemolytic streptococcal infections need not be performed routinely because nonsusceptible isolates are extremely rare in any beta-hemolytic streptococcus and have not been reported for Streptococcus pyogenes (group A). (CLSI 2011)    RESULT 2 Comment     Comment: Mixed urogenital flora 25,000-50,000 colony forming units per mL   POCT urinalysis dipstick     Status: Abnormal   Collection Time: 02/14/16  9:33 AM  Result Value Ref Range   Color, UA dark yellow    Clarity, UA cloudy    Glucose, UA neg    Bilirubin, UA neg    Ketones, UA neg    Spec Grav, UA <=1.005    Blood, UA Large    pH, UA 7.5    Protein, UA 30    Urobilinogen, UA 0.2    Nitrite, UA neg    Leukocytes, UA large (3+) (A) Negative     PHQ2/9: Depression screen [facility] 2/9 04/15/2016 02/14/2016 01/14/2016 01/10/2016 10/08/2015  Decreased Interest 0 0 0 0 0  Down, Depressed, Hopeless 0 0 0 0 0  PHQ - 2 Score 0 0 0 0 0  Altered sleeping - - - - -  Tired, decreased energy - - - - -  Change in appetite - - - - -  Feeling bad or failure about yourself  - - - - -  Trouble concentrating - - - - -  Moving slowly or fidgety/restless - - - - -  Suicidal thoughts - - - - -  PHQ-9 Score - - - - -  Difficult doing work/chores - - - - -     Fall Risk: Fall Risk  04/15/2016 02/14/2016 01/14/2016 01/10/2016 10/08/2015  Falls in the past year? No No No No No    Functional Status Survey: Is the patient deaf or have difficulty hearing?: No Does  the patient have difficulty seeing, even when wearing glasses/contacts?: No Does the patient have difficulty concentrating, remembering, or making decisions?: No Does the patient have difficulty  walking or climbing stairs?: No Does the patient have difficulty dressing or bathing?: No Does the patient have difficulty doing errands alone such as visiting a doctor's office or shopping?: No    Assessment & Plan  1. Bursitis of left hip  - Ambulatory referral to Physical Therapy  2. Epicondylitis elbow, medial, right  - Ambulatory referral to Physical Therapy  3. Urinary tract infection without hematuria, site unspecified  - CULTURE, URINE COMPREHENSIVE  4. Trichomoniasis  Symptoms resolved, not currently sexually active  5. Anticoagulated on Coumadin  We will try to avoid NSAID's

## 2016-04-17 LAB — CULTURE, URINE COMPREHENSIVE: ORGANISM ID, BACTERIA: NO GROWTH

## 2016-04-23 ENCOUNTER — Encounter: Payer: Self-pay | Admitting: Physical Therapy

## 2016-04-23 ENCOUNTER — Ambulatory Visit: Payer: Medicare Other | Attending: Family Medicine | Admitting: Physical Therapy

## 2016-04-23 DIAGNOSIS — M6281 Muscle weakness (generalized): Secondary | ICD-10-CM | POA: Insufficient documentation

## 2016-04-23 DIAGNOSIS — I480 Paroxysmal atrial fibrillation: Secondary | ICD-10-CM | POA: Diagnosis not present

## 2016-04-23 DIAGNOSIS — M25552 Pain in left hip: Secondary | ICD-10-CM

## 2016-04-23 DIAGNOSIS — M25521 Pain in right elbow: Secondary | ICD-10-CM | POA: Diagnosis not present

## 2016-04-23 DIAGNOSIS — R262 Difficulty in walking, not elsewhere classified: Secondary | ICD-10-CM | POA: Diagnosis not present

## 2016-04-24 NOTE — Therapy (Signed)
[facility] [address], [place], [address] Phone: [phone]   Fax:  [phone]  Physical Therapy Evaluation  Patient Details  Name: [name] MRN: [id number] Date of Birth: Oct 10, 1940 Referring Provider: [name] MD  Encounter Date: 04/23/2016      PT End of Session - 04/23/16 0945    Visit Number 1   Number of Visits 12   Date for PT Re-Evaluation 06/04/16   Authorization Type 1   Authorization Time Period 10 (G-code)   PT Start Time 0850   PT Stop Time 0945   PT Time Calculation (min) 55 min   Activity Tolerance Patient tolerated treatment well   Behavior During Therapy [facility] for tasks assessed/performed      Past Medical History:  Diagnosis Date  . CHF (congestive heart failure) ([place])   . COPD (chronic obstructive pulmonary disease) ([place])   . Hyperlipidemia     Past Surgical History:  Procedure Laterality Date  . ABDOMINAL HYSTERECTOMY  1996  . CARDIAC VALVE REPLACEMENT  2014    There were no vitals filed for this visit.       Subjective Assessment - 04/23/16 0911    Subjective Patient reports she is having stiffness and pain in left hip when moving, walking, standing on feet too long. She is also c/o pain in right elbow since lifting boxes at work.    Pertinent History Patient reports history of pain on/off for 3 years. 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    Limitations Lifting;Standing;Walking;House hold activities   How long can you sit comfortably? as long as she wants   How long can you stand comfortably? <10 min.    How long can you walk comfortably? 30 min.   Patient Stated Goals pain to go away to be able to walk and stand and lift without pain   Currently in Pain? Yes   Pain Location Hip 3/10 right elbow pain 1/10   Pain Orientation Left  right elbow medial aspect   Pain Descriptors / Indicators Stabbing   Pain  Type Chronic pain   Pain Onset More than a month ago   Aggravating Factors  standing, walking for left hip,             [facility] PT Assessment - 04/23/16 0905      Assessment   Medical Diagnosis Bursitis left hip, elbow medial epicondylitis right   Referring Provider [name] MD   Onset Date/Surgical Date 04/09/15   Hand Dominance Right   Next MD Visit unknown   Prior Therapy no     Precautions   Precautions None     Restrictions   Weight Bearing Restrictions No     Balance Screen   Has the patient fallen in the past 6 months No   Has the patient had a decrease in activity level because of a fear of falling?  No   Is the patient reluctant to leave their home because of a fear of falling?  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   Leisure cooking, social activities     Cognition   Overall Cognitive Status Within Functional Limits for tasks assessed     Objective: Gait: antalgic, decreased weight shift to left LE AROM: left hip, knee ankle WNL's, right elbow WNL flexion, extension Strength: left hip abduction 4-/5, ER 4-/5, extension 4-/5, knee extension 4/5, flexion 4/5 Right LE major muscle groups WNLs throughout Special tests: FABERS (-) right, left hip SLR (-) right, left Palpation: (+) tenderness lateral aspect of both hips generalized tenderness; mild tenderness medial aspect right elbow  Outcome measures: LEFS: 34/64 QuickDash: 15%  Treatment:  Therapeutic exercises: patient performed exercises with guidance, verbal and tactile cues and demonstration of PT: Supine lying:  Piriformis stretch  bilateral 3 x 20 second holds Side lying: Clamshells with guidance for proper hip/trunk alignment x 10 reps each LE Sitting: Hip adduction with ball with glute sets x 10 reps Hip abduction with resistive band x 15 reps Standing: Diagonal wedding march x 1 min.  Patient response to treatment:  Good demonstration of technique with exercises following demonstration and with moderate verbal cuing. Improved gait pattern with improved weight shift to left LE following treatment with no pain reported in left hip, good understanding of home exercise program verbalized         PT Education - 04/23/16 0940    Education provided Yes   Education Details HEP: hip exercises: hip adduction with glute sets, hip abduction with resistive band, side lying clam, glute sets, piriformis stretch in supine lying   Person(s) Educated Patient   Methods Explanation;Demonstration;Verbal cues;Handout   Comprehension Verbalized understanding;Returned demonstration;Verbal cues required             PT Long Term Goals - 04/23/16 0950      PT LONG TERM GOAL #1   Title Patient will demonstrate improved function with walking, daily tasks as indicated by LEFS of 50/64 by 06/04/2016   Baseline LEFS 34/64   Status New     PT LONG TERM GOAL #2   Title Patient will be independent with home exercises without cuing for improving flexibilty and strength left LE and right UE by 06/04/2016 in order to continue to improve and return to PLOF at discharge from PT   Baseline limited knowledge of pain control strategies, progression of exercises without guidance and instruction   Status New     PT LONG TERM GOAL #3   Title Quick Dash 10% or less indicating improved functional use right UE without elbow pain by 06/04/2016 in order to be able to lift objects without difficulty   Baseline quickDash 15%   Status New               Plan - 04/23/16 0945    Clinical Impression Statement Patient is a 75 yo right hand  dominant female who presents with left hip pain and right elbow pain. She has limitations with daily activities with walking, lifting objects, performing community activities without difficulty. She has LEFS 34/64 (50% self perceived disability), QuickDash  for right UE 15% self perceived disabilty for daily tasks. She has decreased strength in right LE hip abduction, ER and extension, knee flexion and extension. She has limited knowledge of appropriate pain control strategies and progression of exercises in order to improve strength and return to prior level of function.    Rehab Potential Good   Clinical Impairments Affecting Rehab Potential (+) acute condition, motivated  (-) chronic  condition left hip pain   PT Frequency 2x / week   PT Duration 6 weeks   PT Treatment/Interventions Iontophoresis 4mg /ml Dexamethasone;Patient/family education;Electrical Stimulation;Cryotherapy;Neuromuscular re-education;Manual techniques;Therapeutic exercise;Ultrasound;Moist Heat   PT Next Visit Plan pain control modalities, manual therapy techniques, therapeutic exercise for UE and LE   PT Home Exercise Plan Hip adduction, ER, walking exercises, side lying clam   Consulted and Agree with Plan of Care Patient      Patient will benefit from skilled therapeutic intervention in order to improve the following deficits and impairments:  Decreased strength, Pain, Decreased activity tolerance, Decreased endurance, Increased muscle spasms, Impaired UE functional use, Difficulty walking  Visit Diagnosis: Pain in left hip - Plan: PT plan of care cert/re-cert  Muscle weakness (generalized) - Plan: PT plan of care cert/re-cert  Pain in right elbow - Plan: PT plan of care cert/re-cert  Difficulty in walking, not elsewhere classified - Plan: PT plan of care cert/re-cert      G-Codes - [id number]    Functional Assessment Tool Used LEFS, ROM, strength deficits, pain scale, clinical judgment   Functional Limitation  Mobility: Walking and moving around   Mobility: Walking and Moving Around Current Status [id number]) At least 40 percent but less than 60 percent impaired, limited or restricted   Mobility: Walking and Moving Around Goal Status [phone]) At least 1 percent but less than 20 percent impaired, limited or restricted       Problem List Patient Active Problem List   Diagnosis Date Noted  . Chronic systolic heart failure ([place]) 10/08/2015  . Chronic obstructive pulmonary disease ([place]) 07/04/2015  . Controlled gout 06/06/2015  . Anxiety 02/09/2015  . Synovial cyst of popliteal space 02/09/2015  . Benign hypertension 02/09/2015  . Blood type A+ 02/09/2015  . Arterial branch occlusion of retina 02/09/2015  . Chronic constipation 02/09/2015  . Insomnia, persistent 02/09/2015  . Chronic combined systolic and diastolic CHF, NYHA class 1 ([place]) 02/09/2015  . Compromised kidney function 02/09/2015  . Dyslipidemia 02/09/2015  . Atrial fibrillation ([place]) 02/09/2015  . Arthritis urica 02/09/2015  . Hearing loss 02/09/2015  . History of open heart surgery 02/09/2015  . Chronic hoarseness 02/09/2015  . Cardiomegaly 02/09/2015  . Dysmetabolic syndrome 0000000  . Migraine with aura and without status migrainosus, not intractable 02/09/2015  . Adult BMI 30+ 02/09/2015  . Hypertensive pulmonary vascular disease ([place]) 02/09/2015  . Allergic rhinitis, seasonal 02/09/2015  . Vitamin D deficiency 02/09/2015  . History of mitral valve repair 10/01/2012  . History of aortic valve replacement 10/01/2012  . Congestive heart failure ([place]) 10/01/2012    Jomarie Longs PT 04/24/2016, 3:03 PM  [facility] [address] [place], [address] Phone: [phone]   Fax:  [phone]  Name: [name] MRN: [id number] Date of Birth: September 04, 1941

## 2016-04-25 ENCOUNTER — Encounter: Payer: Self-pay | Admitting: Physical Therapy

## 2016-04-25 ENCOUNTER — Ambulatory Visit: Payer: Medicare Other | Admitting: Physical Therapy

## 2016-04-25 DIAGNOSIS — R262 Difficulty in walking, not elsewhere classified: Secondary | ICD-10-CM | POA: Diagnosis not present

## 2016-04-25 DIAGNOSIS — M25552 Pain in left hip: Secondary | ICD-10-CM

## 2016-04-25 DIAGNOSIS — M6281 Muscle weakness (generalized): Secondary | ICD-10-CM | POA: Diagnosis not present

## 2016-04-25 DIAGNOSIS — M25521 Pain in right elbow: Secondary | ICD-10-CM | POA: Diagnosis not present

## 2016-04-26 NOTE — Therapy (Signed)
[facility] [address], [place], [address] Phone: [phone]   Fax:  [phone]  Physical Therapy Treatment  Patient Details  Name: [name] MRN: [id number] Date of Birth: 1941/07/20 Referring Provider: [name] MD  Encounter Date: 04/25/2016      PT End of Session - 04/25/16 0940    Visit Number 2   Number of Visits 12   Date for PT Re-Evaluation 06/04/16   Authorization Type 2   Authorization Time Period 10 (G-code)   PT Start Time 0900   PT Stop Time 0935   PT Time Calculation (min) 35 min   Activity Tolerance Patient tolerated treatment well   Behavior During Therapy [facility] for tasks assessed/performed      Past Medical History:  Diagnosis Date  . CHF (congestive heart failure) ([place])   . COPD (chronic obstructive pulmonary disease) ([place])   . Hyperlipidemia     Past Surgical History:  Procedure Laterality Date  . ABDOMINAL HYSTERECTOMY  1996  . CARDIAC VALVE REPLACEMENT  2014    There were no vitals filed for this visit.      Subjective Assessment - 04/25/16 0907    Subjective Patient reports she is exercising her legs as instructed. She is having pain and stiffness in her left hip and difficulty with walking without pain.    Limitations Lifting;Standing;Walking;House hold activities   Patient Stated Goals pain to go away to be able to walk and stand and lift without pain   Currently in Pain? Yes   Pain Score 4    Pain Location Hip  no elbow pain   Pain Orientation Left   Pain Descriptors / Indicators Aching   Pain Type Chronic pain   Pain Onset More than a month ago   Pain Frequency Intermittent      Objective: Gait: ambulating into clinic antalgic gait pattern with limp on left ROM: decreased left hip rotations, decreased hamstring length bilaterally   Treatment: patient performed exercises with guidance, assistance, verbal and tactile cues and demonstration  of PT: Standing: Hip flexor stretch at chair: 3 x 20 seconds Supine:  Assisted hamstring stretch 3 x 30 seconds Assisted piriformis stretch: 3 x 30 seconds Assisted hip ER stretching supine and sitting 3 x 20 seconds Side lying:  Clamshells with tactile cues to perform with hip/trunk aligned 15x each LE  Patient response to treatment: required tactile and moderate verbal cuing and demonstration to perform stretching exercises correctly. Improved ROM with reported less stiffness and no pain at end of session with walking          PT Education - 04/25/16 0930    Education provided Yes   Education Details HEP: added multiple hip stretches with instruction and written program    Person(s) Educated Patient   Methods Explanation;Demonstration;Tactile cues;Verbal cues;Handout   Comprehension Verbalized understanding;Returned demonstration;Verbal cues required             PT Long Term Goals - 04/23/16 0950      PT LONG TERM GOAL #1   Title Patient will demonstrate improved function with walking, daily tasks as indicated by LEFS of 50/64 by 06/04/2016   Baseline LEFS 34/64   Status New     PT LONG TERM GOAL #2   Title Patient will be independent with home exercises without cuing for improving flexibilty and strength left LE and right UE by 06/04/2016 in order to continue to improve and return to PLOF  at discharge from PT   Baseline limited knowledge of pain control strategies, progression of exercises without guidance and instruction   Status New     PT LONG TERM GOAL #3   Title Quick Dash 10% or less indicating improved functional use right UE without elbow pain by 06/04/2016 in order to be able to lift objects without difficulty   Baseline quickDash 15%   Status New               Plan - 04/25/16 1200    Clinical Impression Statement Patient able to walk without stiffness with improved gait pattern following exercises with concentration on hip stretching today. she is  compliant with home program as given and should continue to progress with additional physical therapy intervention to achieve goals.    Rehab Potential Good   PT Frequency 2x / week   PT Duration 6 weeks   PT Treatment/Interventions Iontophoresis 4mg /ml Dexamethasone;Patient/family education;Electrical Stimulation;Cryotherapy;Neuromuscular re-education;Manual techniques;Therapeutic exercise;Ultrasound;Moist Heat   PT Next Visit Plan pain control modalities, manual therapy techniques, therapeutic exercise for UE and LE (work on strength)   PT Home Exercise Plan added hip stretches for hamstring, piriformis and anterior hip      Patient will benefit from skilled therapeutic intervention in order to improve the following deficits and impairments:  Decreased strength, Pain, Decreased activity tolerance, Decreased endurance, Increased muscle spasms, Impaired UE functional use, Difficulty walking  Visit Diagnosis: Pain in left hip  Muscle weakness (generalized)  Difficulty in walking, not elsewhere classified     Problem List Patient Active Problem List   Diagnosis Date Noted  . Chronic systolic heart failure ([place]) 10/08/2015  . Chronic obstructive pulmonary disease ([place]) 07/04/2015  . Controlled gout 06/06/2015  . Anxiety 02/09/2015  . Synovial cyst of popliteal space 02/09/2015  . Benign hypertension 02/09/2015  . Blood type A+ 02/09/2015  . Arterial branch occlusion of retina 02/09/2015  . Chronic constipation 02/09/2015  . Insomnia, persistent 02/09/2015  . Chronic combined systolic and diastolic CHF, NYHA class 1 ([place]) 02/09/2015  . Compromised kidney function 02/09/2015  . Dyslipidemia 02/09/2015  . Atrial fibrillation ([place]) 02/09/2015  . Arthritis urica 02/09/2015  . Hearing loss 02/09/2015  . History of open heart surgery 02/09/2015  . Chronic hoarseness 02/09/2015  . Cardiomegaly 02/09/2015  . Dysmetabolic syndrome 0000000  . Migraine with aura and without status  migrainosus, not intractable 02/09/2015  . Adult BMI 30+ 02/09/2015  . Hypertensive pulmonary vascular disease ([place]) 02/09/2015  . Allergic rhinitis, seasonal 02/09/2015  . Vitamin D deficiency 02/09/2015  . History of mitral valve repair 10/01/2012  . History of aortic valve replacement 10/01/2012  . Congestive heart failure ([place]) 10/01/2012    Jomarie Longs PT 04/26/2016, 1:02 PM  [facility] [address] [place], [address] Phone: [phone]   Fax:  [phone]  Name: [name] MRN: [id number] Date of Birth: 04/03/1941

## 2016-04-29 ENCOUNTER — Ambulatory Visit: Payer: Medicare Other | Admitting: Physical Therapy

## 2016-04-29 ENCOUNTER — Encounter: Payer: Self-pay | Admitting: Physical Therapy

## 2016-04-29 DIAGNOSIS — M6281 Muscle weakness (generalized): Secondary | ICD-10-CM

## 2016-04-29 DIAGNOSIS — M25521 Pain in right elbow: Secondary | ICD-10-CM | POA: Diagnosis not present

## 2016-04-29 DIAGNOSIS — M25552 Pain in left hip: Secondary | ICD-10-CM | POA: Diagnosis not present

## 2016-04-29 DIAGNOSIS — R262 Difficulty in walking, not elsewhere classified: Secondary | ICD-10-CM

## 2016-04-29 NOTE — Therapy (Signed)
[facility] [address] [place], [address] Phone: [phone]   Fax:  [phone]  Physical Therapy Treatment  Patient Details  Name: [name] MRN: [id number] Date of Birth: 1941/02/02 Referring Provider: [name] MD  Encounter Date: 04/29/2016      PT End of Session - 04/29/16 1030    Visit Number 3   Number of Visits 12   Date for PT Re-Evaluation 06/04/16   Authorization Type 3   Authorization Time Period 10 (G-code)   PT Start Time 0935   PT Stop Time 1030   PT Time Calculation (min) 55 min   Activity Tolerance Patient tolerated treatment well   Behavior During Therapy [facility] for tasks assessed/performed      Past Medical History:  Diagnosis Date  . CHF (congestive heart failure) ([place])   . COPD (chronic obstructive pulmonary disease) ([place])   . Hyperlipidemia     Past Surgical History:  Procedure Laterality Date  . ABDOMINAL HYSTERECTOMY  1996  . CARDIAC VALVE REPLACEMENT  2014    There were no vitals filed for this visit.      Subjective Assessment - 04/29/16 1013    Subjective Patient reports she is exercising  as instructed and is noticing less pain in her left hip.    Limitations Lifting;Standing;Walking;House hold activities   Patient Stated Goals pain to go away to be able to walk and stand and lift without pain   Currently in Pain? Yes   Pain Score 3    Pain Location Hip   Pain Orientation Left   Pain Descriptors / Indicators Aching   Pain Type Chronic pain   Pain Onset More than a month ago   Pain Frequency Intermittent      Objective: Gait: ambulating into clinic antalgic gait pattern with limp on left ROM: decreased left hip rotations, decreased hamstring length bilaterally   Treatment: patient performed exercises with guidance, assistance, verbal and tactile cues and demonstration of PT: Nustep pre exercise warm up x 15 min. Level 2  (unbilled) Supine:  Assisted hamstring stretch 3 x 30 seconds Assisted piriformis stretch: 3 x 30 seconds Assisted hip ER stretching supine and sitting 3 x 20 seconds Side lying:  Clamshells with tactile cues to perform with hip/trunk aligned 15x each LE Hip and knee flexion/extension with assistance to support LE x 10 reps for left LE Hip abduction with assistance x 10 reps left LE  Patient response to treatment: Patient required moderate verbal and tactile cues for proper alignment of hip/trunk for activation of correct muscle groups. Patient required assistant for side lying hip exercises due to weakness in hip musculature Patient reported less stiffness and no pain at end of session           PT Education - 04/29/16 1030    Education provided Yes   Education Details HEP: re assessed hip stretches and exercises   Person(s) Educated Patient   Methods Explanation;Demonstration;Verbal cues   Comprehension Verbalized understanding;Returned demonstration;Verbal cues required             PT Long Term Goals - 04/23/16 0950      PT LONG TERM GOAL #1   Title Patient will demonstrate improved function with walking, daily tasks as indicated by LEFS of 50/64 by 06/04/2016   Baseline LEFS 34/64   Status New     PT LONG TERM GOAL #2   Title Patient will be independent with home exercises  without cuing for improving flexibilty and strength left LE and right UE by 06/04/2016 in order to continue to improve and return to PLOF at discharge from PT   Baseline limited knowledge of pain control strategies, progression of exercises without guidance and instruction   Status New     PT LONG TERM GOAL #3   Title Quick Dash 10% or less indicating improved functional use right UE without elbow pain by 06/04/2016 in order to be able to lift objects without difficulty   Baseline quickDash 15%   Status New               Plan - 04/29/16 1030    Clinical Impression Statement Patient  responding well to treatment for left hip with stretching and strengthening. she is able to walk with less stiffness and hip pain following treatment and remains with weakness in left LE that will benefit from additional physical therapy intervention.    Rehab Potential Good   PT Frequency 2x / week   PT Duration 6 weeks   PT Treatment/Interventions Iontophoresis 4mg /ml Dexamethasone;Patient/family education;Electrical Stimulation;Cryotherapy;Neuromuscular re-education;Manual techniques;Therapeutic exercise;Ultrasound;Moist Heat   PT Next Visit Plan pain control modalities, manual therapy techniques, therapeutic exercise for UE and LE   PT Home Exercise Plan continue with stretches, strengthening exercises as instructed      Patient will benefit from skilled therapeutic intervention in order to improve the following deficits and impairments:  Decreased strength, Pain, Decreased activity tolerance, Decreased endurance, Increased muscle spasms, Impaired UE functional use, Difficulty walking  Visit Diagnosis: Pain in left hip  Muscle weakness (generalized)  Difficulty in walking, not elsewhere classified     Problem List Patient Active Problem List   Diagnosis Date Noted  . Chronic systolic heart failure ([place]) 10/08/2015  . Chronic obstructive pulmonary disease ([place]) 07/04/2015  . Controlled gout 06/06/2015  . Anxiety 02/09/2015  . Synovial cyst of popliteal space 02/09/2015  . Benign hypertension 02/09/2015  . Blood type A+ 02/09/2015  . Arterial branch occlusion of retina 02/09/2015  . Chronic constipation 02/09/2015  . Insomnia, persistent 02/09/2015  . Chronic combined systolic and diastolic CHF, NYHA class 1 ([place]) 02/09/2015  . Compromised kidney function 02/09/2015  . Dyslipidemia 02/09/2015  . Atrial fibrillation ([place]) 02/09/2015  . Arthritis urica 02/09/2015  . Hearing loss 02/09/2015  . History of open heart surgery 02/09/2015  . Chronic hoarseness 02/09/2015  .  Cardiomegaly 02/09/2015  . Dysmetabolic syndrome 0000000  . Migraine with aura and without status migrainosus, not intractable 02/09/2015  . Adult BMI 30+ 02/09/2015  . Hypertensive pulmonary vascular disease ([place]) 02/09/2015  . Allergic rhinitis, seasonal 02/09/2015  . Vitamin D deficiency 02/09/2015  . History of mitral valve repair 10/01/2012  . History of aortic valve replacement 10/01/2012  . Congestive heart failure ([place]) 10/01/2012    Jomarie Longs PT 04/30/2016, 2:43 PM  [facility] [address] [place], [address] Phone: [phone]   Fax:  [phone]  Name: ABEGAIL MCCALVIN MRN: [id number] Date of Birth: May 15, 1941

## 2016-05-01 ENCOUNTER — Telehealth: Payer: Self-pay

## 2016-05-01 NOTE — Telephone Encounter (Signed)
Patient is experiencing sinus drainage and coughing at night that keeps her up throughout the night. Patient states it has been going on for 1 week and wondered if you could give some cough medication to [facility] in [place]. [phone]

## 2016-05-01 NOTE — Telephone Encounter (Signed)
She will need a follow up

## 2016-05-01 NOTE — Telephone Encounter (Signed)
Pt informed and asked to be put on cancellation list for a afternoon appt after 1:30 pm

## 2016-05-06 ENCOUNTER — Ambulatory Visit: Payer: Medicare Other | Admitting: Physical Therapy

## 2016-05-08 ENCOUNTER — Ambulatory Visit: Payer: Medicare Other | Admitting: Physical Therapy

## 2016-05-09 ENCOUNTER — Other Ambulatory Visit: Payer: Self-pay | Admitting: Family Medicine

## 2016-05-09 NOTE — Telephone Encounter (Signed)
Last seen 04/15/16, last filled 10/15/15

## 2016-05-13 ENCOUNTER — Ambulatory Visit: Payer: Medicare Other | Admitting: Physical Therapy

## 2016-05-13 ENCOUNTER — Encounter: Payer: Medicare Other | Admitting: Physical Therapy

## 2016-05-15 ENCOUNTER — Encounter: Payer: Medicare Other | Admitting: Physical Therapy

## 2016-05-15 ENCOUNTER — Ambulatory Visit: Payer: Medicare Other | Admitting: Physical Therapy

## 2016-05-20 ENCOUNTER — Encounter: Payer: Self-pay | Admitting: Physical Therapy

## 2016-05-20 ENCOUNTER — Ambulatory Visit: Payer: Medicare Other | Attending: Family Medicine | Admitting: Physical Therapy

## 2016-05-20 DIAGNOSIS — R262 Difficulty in walking, not elsewhere classified: Secondary | ICD-10-CM | POA: Insufficient documentation

## 2016-05-20 DIAGNOSIS — M25552 Pain in left hip: Secondary | ICD-10-CM | POA: Diagnosis not present

## 2016-05-20 DIAGNOSIS — M6281 Muscle weakness (generalized): Secondary | ICD-10-CM | POA: Diagnosis not present

## 2016-05-20 NOTE — Therapy (Signed)
[facility] [address], [place], [address] Phone: [phone]   Fax:  [phone]  Physical Therapy Treatment  Patient Details  Name: [name] MRN: [id number] Date of Birth: Feb 17, 1941 Referring Provider: [name] MD  Encounter Date: 05/20/2016      PT End of Session - 05/20/16 1537    Visit Number 4   Number of Visits 12   Date for PT Re-Evaluation 06/04/16   Authorization Type 4   Authorization Time Period 10 (G-code)   PT Start Time 1531   PT Stop Time 1620   PT Time Calculation (min) 49 min   Activity Tolerance Patient tolerated treatment well   Behavior During Therapy [facility] for tasks assessed/performed      Past Medical History:  Diagnosis Date  . CHF (congestive heart failure) ([place])   . COPD (chronic obstructive pulmonary disease) ([place])   . Hyperlipidemia     Past Surgical History:  Procedure Laterality Date  . ABDOMINAL HYSTERECTOMY  1996  . CARDIAC VALVE REPLACEMENT  2014    There were no vitals filed for this visit.      Subjective Assessment - 05/20/16 1540    Subjective Patient reports she has been treating a gout flare up for the past 2 weeks and is returning to therapy for left hip pain/weakness. She still has swelling in left foot and ankle, less hip pain since she has not been walking as much. She is now returning to PT for continued treatment of left hip pain and weakness.   Limitations Lifting;Standing;Walking;House hold activities   Patient Stated Goals pain to go away to be able to walk and stand and lift without pain   Currently in Pain? No/denies      Objective: Gait: ambulating without obvious gait deviations Strength: left hip decreased strength abduction, ER extension as compared to right LE   Treatment: Therapeutic exercise:  patient performed exercises with guidance, assistance, verbal and tactile cues and demonstration of PT: Supine:   Assisted hamstring stretch 3 x 30 seconds Assisted piriformis stretch: 3 x 30 seconds Assisted hip ER stretching supine and sitting 3 x 20 seconds Stabilization hook lying marches  3 x 10 Bridging 3 x 10  Side lying:  Clamshells with tactile cues to perform with hip/trunk aligned 15x each LE Hip abduction with assistance x 10 reps left LE  Standing:  Hip extension (hip rotary machine) left LE only x 15 reps 40# Single leg stand with one foot for balance on large balance stone toss ball x 10 reps each.   Nustep post exercise x 10 min,  Level 2 (unbilled)  Patient response to treatment: Patient demonstrated decreased hip pain, improved flexibility and able to perform exercises with moderate cuing for correct alignment and technique.      PT Education - 05/20/16 1543    Education provided Yes   Education Details HEP: stretches and strengthening exercises; hook lying marches, bridging   Person(s) Educated Patient   Methods Explanation;Demonstration;Verbal cues   Comprehension Verbalized understanding;Returned demonstration;Verbal cues required             PT Long Term Goals - 04/23/16 0950      PT LONG TERM GOAL #1   Title Patient will demonstrate improved function with walking, daily tasks as indicated by LEFS of 50/64 by 06/04/2016   Baseline LEFS 34/64   Status New     PT LONG TERM GOAL #2   Title Patient  will be independent with home exercises without cuing for improving flexibilty and strength left LE and right UE by 06/04/2016 in order to continue to improve and return to PLOF at discharge from PT   Baseline limited knowledge of pain control strategies, progression of exercises without guidance and instruction   Status New     PT LONG TERM GOAL #3   Title Quick Dash 10% or less indicating improved functional use right UE without elbow pain by 06/04/2016 in order to be able to lift objects without difficulty   Baseline quickDash 15%   Status New                Plan - 05/20/16 1625    Clinical Impression Statement patient demonstrates decreasing pain and improving strength in left hip. she continues with weakness in left hip musculature and requires guidance and cuing to perform exercises with correct technique and will therefore benefit from additional physical therapy intervention.    Rehab Potential Good   PT Frequency 2x / week   PT Duration 6 weeks   PT Treatment/Interventions Iontophoresis 4mg /ml Dexamethasone;Patient/family education;Electrical Stimulation;Cryotherapy;Neuromuscular re-education;Manual techniques;Therapeutic exercise;Ultrasound;Moist Heat   PT Next Visit Plan pain control modalities, manual therapy techniques, therapeutic exercise for UE and LE   PT Home Exercise Plan continue with stretches, strengthening exercises as instructed      Patient will benefit from skilled therapeutic intervention in order to improve the following deficits and impairments:  Decreased strength, Pain, Decreased activity tolerance, Decreased endurance, Increased muscle spasms, Impaired UE functional use, Difficulty walking  Visit Diagnosis: Pain in left hip  Muscle weakness (generalized)  Difficulty in walking, not elsewhere classified     Problem List Patient Active Problem List   Diagnosis Date Noted  . Chronic systolic heart failure ([place]) 10/08/2015  . Chronic obstructive pulmonary disease ([place]) 07/04/2015  . Controlled gout 06/06/2015  . Anxiety 02/09/2015  . Synovial cyst of popliteal space 02/09/2015  . Benign hypertension 02/09/2015  . Blood type A+ 02/09/2015  . Arterial branch occlusion of retina 02/09/2015  . Chronic constipation 02/09/2015  . Insomnia, persistent 02/09/2015  . Chronic combined systolic and diastolic CHF, NYHA class 1 ([place]) 02/09/2015  . Compromised kidney function 02/09/2015  . Dyslipidemia 02/09/2015  . Atrial fibrillation ([place]) 02/09/2015  . Arthritis urica 02/09/2015  . Hearing loss 02/09/2015  .  History of open heart surgery 02/09/2015  . Chronic hoarseness 02/09/2015  . Cardiomegaly 02/09/2015  . Dysmetabolic syndrome 0000000  . Migraine with aura and without status migrainosus, not intractable 02/09/2015  . Adult BMI 30+ 02/09/2015  . Hypertensive pulmonary vascular disease ([place]) 02/09/2015  . Allergic rhinitis, seasonal 02/09/2015  . Vitamin D deficiency 02/09/2015  . History of mitral valve repair 10/01/2012  . History of aortic valve replacement 10/01/2012  . Congestive heart failure ([place]) 10/01/2012    Jomarie Longs PT 05/20/2016, 10:40 PM  [facility] [address], [place], [address] Phone: [phone]   Fax:  [phone]  Name: DINASIA SHROFF MRN: [id number] Date of Birth: 03-03-1941

## 2016-05-22 ENCOUNTER — Ambulatory Visit: Payer: Medicare Other | Admitting: Physical Therapy

## 2016-05-22 ENCOUNTER — Encounter: Payer: Self-pay | Admitting: Physical Therapy

## 2016-05-22 DIAGNOSIS — M25552 Pain in left hip: Secondary | ICD-10-CM | POA: Diagnosis not present

## 2016-05-22 DIAGNOSIS — M6281 Muscle weakness (generalized): Secondary | ICD-10-CM | POA: Diagnosis not present

## 2016-05-22 DIAGNOSIS — R262 Difficulty in walking, not elsewhere classified: Secondary | ICD-10-CM | POA: Diagnosis not present

## 2016-05-22 NOTE — Therapy (Signed)
[facility] [address] [place], [address] Phone: [phone]   Fax:  [phone]  Physical Therapy Treatment  Patient Details  Name: [name] MRN: [id number] Date of Birth: 1941-02-08 Referring Provider: [name] MD  Encounter Date: 05/22/2016      PT End of Session - 05/22/16 1906    Visit Number 5   Number of Visits 12   Date for PT Re-Evaluation 06/04/16   Authorization Type 5   Authorization Time Period 10 (G-code)   PT Start Time 1825   PT Stop Time 1850   PT Time Calculation (min) 25 min   Activity Tolerance Patient tolerated treatment well;Patient limited by pain  injury to right hip limits activity   Behavior During Therapy [facility] for tasks assessed/performed      Past Medical History:  Diagnosis Date  . CHF (congestive heart failure) ([place])   . COPD (chronic obstructive pulmonary disease) ([place])   . Hyperlipidemia     Past Surgical History:  Procedure Laterality Date  . ABDOMINAL HYSTERECTOMY  1996  . CARDIAC VALVE REPLACEMENT  2014    There were no vitals filed for this visit.      Subjective Assessment - 05/22/16 1830    Subjective right hip stiff/painful today no apparent reason. 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 Yes   Pain Score 3    Pain Location Hip   Pain Orientation Right   Pain Descriptors / Indicators Aching   Pain Type Acute pain   Pain Onset Today   Pain Frequency Intermittent     Objective: Gait; antalgic pattern with limp on right   Treatment:  Modalities:  Moist Heat right hip; 15 min. With patient supine lying: no adverse reactions noted  Therapeutic exercise:patient performed exercises with guidance, verbal and tactile cues and demonstration of PT: Stretches  both hips: hip flexion, rotations, hamstring stretches 3 x 20 seconds each LE Isometric ER and abduction left hip x 10 reps NuStep 7 min. Independent (unbilled)  Patient response to treatment: No pain right hip at end of session,some soreness with walking; able to perform all exercise with assistance of therapist without exacerbation of pain in either hip, verbalized good understanding of exercises to perform at home         PT Education - 05/22/16 1845    Education provided Yes   Education Details Instructed in use of heat to be used as needed for hip soreness 20 min. at a time with barrier between skin and heat   Person(s) Educated Patient   Methods Explanation   Comprehension Verbalized understanding             PT Long Term Goals - 04/23/16 0950      PT LONG TERM GOAL #1   Title Patient will demonstrate improved function with walking, daily tasks as indicated by LEFS of 50/64 by 06/04/2016   Baseline LEFS 34/64   Status New     PT LONG TERM GOAL #2   Title Patient will be independent with home exercises without cuing for improving flexibilty and strength left LE and right UE by 06/04/2016 in order to continue to improve and return to PLOF at discharge from PT   Baseline limited knowledge of pain control strategies, progression  of exercises without guidance and instruction   Status New     PT LONG TERM GOAL #3   Title Quick Dash 10% or less indicating improved functional use right UE without elbow pain by 06/04/2016 in order to be able to lift objects without difficulty   Baseline quickDash 15%   Status New               Plan - 05/22/16 1908    Clinical Impression Statement Limited treatment session due to acute onset of right pain at work today prior to arriving in PT. Patient with significant decreased pain in right hip at end of session with improved gait pattern, decreased limp on right. She will benefit from additional physical therapy intervention to address  weakness left hip/LE in order to transition to independent home program.   Rehab Potential Good   PT Frequency 2x / week   PT Duration 6 weeks   PT Treatment/Interventions Iontophoresis 4mg /ml Dexamethasone;Patient/family education;Electrical Stimulation;Cryotherapy;Neuromuscular re-education;Manual techniques;Therapeutic exercise;Ultrasound;Moist Heat   PT Next Visit Plan pain control modalities, manual therapy techniques, therapeutic exercise for UE and LE   PT Home Exercise Plan continue with stretches, strengthening exercises as instructed      Patient will benefit from skilled therapeutic intervention in order to improve the following deficits and impairments:  Decreased strength, Pain, Decreased activity tolerance, Decreased endurance, Increased muscle spasms, Impaired UE functional use, Difficulty walking  Visit Diagnosis: Pain in left hip  Muscle weakness (generalized)  Difficulty in walking, not elsewhere classified     Problem List Patient Active Problem List   Diagnosis Date Noted  . Chronic systolic heart failure ([place]) 10/08/2015  . Chronic obstructive pulmonary disease ([place]) 07/04/2015  . Controlled gout 06/06/2015  . Anxiety 02/09/2015  . Synovial cyst of popliteal space 02/09/2015  . Benign hypertension 02/09/2015  . Blood type A+ 02/09/2015  . Arterial branch occlusion of retina 02/09/2015  . Chronic constipation 02/09/2015  . Insomnia, persistent 02/09/2015  . Chronic combined systolic and diastolic CHF, NYHA class 1 ([place]) 02/09/2015  . Compromised kidney function 02/09/2015  . Dyslipidemia 02/09/2015  . Atrial fibrillation ([place]) 02/09/2015  . Arthritis urica 02/09/2015  . Hearing loss 02/09/2015  . History of open heart surgery 02/09/2015  . Chronic hoarseness 02/09/2015  . Cardiomegaly 02/09/2015  . Dysmetabolic syndrome 0000000  . Migraine with aura and without status migrainosus, not intractable 02/09/2015  . Adult BMI 30+ 02/09/2015  .  Hypertensive pulmonary vascular disease ([place]) 02/09/2015  . Allergic rhinitis, seasonal 02/09/2015  . Vitamin D deficiency 02/09/2015  . History of mitral valve repair 10/01/2012  . History of aortic valve replacement 10/01/2012  . Congestive heart failure ([place]) 10/01/2012    Jomarie Longs PT 05/23/2016, 6:54 PM  [place] [facility] [address] [place], [address] Phone: [phone]   Fax:  [phone]  Name: [name] MRN: [id number] Date of Birth: August 12, 1941

## 2016-05-24 ENCOUNTER — Other Ambulatory Visit: Payer: Self-pay | Admitting: Family Medicine

## 2016-05-27 ENCOUNTER — Other Ambulatory Visit: Payer: Self-pay | Admitting: Family Medicine

## 2016-05-27 DIAGNOSIS — J440 Chronic obstructive pulmonary disease with acute lower respiratory infection: Secondary | ICD-10-CM

## 2016-05-27 NOTE — Telephone Encounter (Signed)
Patient requesting refill of Spiriva be sent to [id number].

## 2016-05-28 ENCOUNTER — Encounter: Payer: Self-pay | Admitting: Physical Therapy

## 2016-05-28 ENCOUNTER — Ambulatory Visit: Payer: Medicare Other | Admitting: Physical Therapy

## 2016-05-28 DIAGNOSIS — M25552 Pain in left hip: Secondary | ICD-10-CM | POA: Diagnosis not present

## 2016-05-28 DIAGNOSIS — R262 Difficulty in walking, not elsewhere classified: Secondary | ICD-10-CM

## 2016-05-28 DIAGNOSIS — M6281 Muscle weakness (generalized): Secondary | ICD-10-CM

## 2016-05-29 DIAGNOSIS — I480 Paroxysmal atrial fibrillation: Secondary | ICD-10-CM | POA: Diagnosis not present

## 2016-05-29 NOTE — Therapy (Signed)
[facility] [address] [place], [address] Phone: [phone]   Fax:  [phone]  Physical Therapy Treatment  Patient Details  Name: [name] MRN: [id number] Date of Birth: 06-Jan-1941 Referring Provider: [name] MD  Encounter Date: 05/28/2016      PT End of Session - 05/28/16 1735    Visit Number 6   Number of Visits 12   Authorization Type 6   Authorization Time Period 10 (G-code)   PT Start Time [id number]   PT Stop Time 1724   PT Time Calculation (min) 49 min   Activity Tolerance Patient tolerated treatment well   Behavior During Therapy [facility] for tasks assessed/performed      Past Medical History:  Diagnosis Date  . CHF (congestive heart failure) ([place])   . COPD (chronic obstructive pulmonary disease) ([place])   . Hyperlipidemia     Past Surgical History:  Procedure Laterality Date  . ABDOMINAL HYSTERECTOMY  1996  . CARDIAC VALVE REPLACEMENT  2014    There were no vitals filed for this visit.      Subjective Assessment - 05/28/16 1635    Subjective Patient reports she is having less pain in right and left hips with mild stiffness in left hip and is exercising as instructed.   Limitations Lifting;Standing;Walking;House hold activities   Patient Stated Goals pain to go away to be able to walk and stand and lift without pain   Currently in Pain? No/denies      Objective: Gait; ambulating independently without obvious deviations Strength: left hip abduction 4-/5, ER 4/5  Treatment:patient performed exercises with guidance, verbal and tactile cues and demonstration of PT: Supine lying: Bridging x 10 with controlled motion, stabilization  With 45cm exercise ball:  Hip and knee flexion with TrA contraction x 15 LTR x 10 Hamstring stretches each LE 3 x 30 seconds with guidance and assistance for good alignment Standing:  Side stepping along airex balance beam x 2 min. Hip rotary  machine for hip extension 40# x 15 reps each LE Standing ball toss with one foot on balance stone 15 tosses each  NuStep unbilled level 3 x 10 min. Following exercise  Patient response to treatment; good demonstration of exercises with demonstration and VC for correct alignment and technique, no increased hip pain reported throughout session            PT Education - 05/28/16 1700    Education provided Yes   Education Details Instructed in additional exercises with use of ball for LTR, hip/knee flexion, hamstring stretching   Person(s) Educated Patient   Methods Explanation;Demonstration;Verbal cues;Handout   Comprehension Verbalized understanding;Returned demonstration;Verbal cues required             PT Long Term Goals - 04/23/16 0950      PT LONG TERM GOAL #1   Title Patient will demonstrate improved function with walking, daily tasks as indicated by LEFS of 50/64 by 06/04/2016   Baseline LEFS 34/64   Status New     PT LONG TERM GOAL #2   Title Patient will be independent with home exercises without cuing for improving flexibilty and strength left LE and right UE by 06/04/2016 in order to continue to improve and return to PLOF at discharge from PT   Baseline limited knowledge of pain control strategies, progression of exercises without guidance and instruction   Status New     PT LONG TERM GOAL #3   Title  Quick Dash 10% or less indicating improved functional use right UE without elbow pain by 06/04/2016 in order to be able to lift objects without difficulty   Baseline quickDash 15%   Status New               Plan - 05/28/16 1737    Clinical Impression Statement Patient demonstrates improving strength and flexibility in left hip with decreasing pain and good carry over between sessions and good progression towards goals.    Rehab Potential Good   PT Frequency 2x / week   PT Duration 6 weeks   PT Treatment/Interventions Iontophoresis 4mg /ml  Dexamethasone;Patient/family education;Electrical Stimulation;Cryotherapy;Neuromuscular re-education;Manual techniques;Therapeutic exercise;Ultrasound;Moist Heat   PT Next Visit Plan pain control modalities, manual therapy techniques, therapeutic exercise for UE and LE   PT Home Exercise Plan continue with stretches, strengthening exercises as instructed      Patient will benefit from skilled therapeutic intervention in order to improve the following deficits and impairments:  Decreased strength, Pain, Decreased activity tolerance, Decreased endurance, Increased muscle spasms, Impaired UE functional use, Difficulty walking  Visit Diagnosis: Pain in left hip  Muscle weakness (generalized)  Difficulty in walking, not elsewhere classified     Problem List Patient Active Problem List   Diagnosis Date Noted  . Chronic systolic heart failure ([place]) 10/08/2015  . Chronic obstructive pulmonary disease ([place]) 07/04/2015  . Controlled gout 06/06/2015  . Anxiety 02/09/2015  . Synovial cyst of popliteal space 02/09/2015  . Benign hypertension 02/09/2015  . Blood type A+ 02/09/2015  . Arterial branch occlusion of retina 02/09/2015  . Chronic constipation 02/09/2015  . Insomnia, persistent 02/09/2015  . Chronic combined systolic and diastolic CHF, NYHA class 1 ([place]) 02/09/2015  . Compromised kidney function 02/09/2015  . Dyslipidemia 02/09/2015  . Atrial fibrillation ([place]) 02/09/2015  . Arthritis urica 02/09/2015  . Hearing loss 02/09/2015  . History of open heart surgery 02/09/2015  . Chronic hoarseness 02/09/2015  . Cardiomegaly 02/09/2015  . Dysmetabolic syndrome 0000000  . Migraine with aura and without status migrainosus, not intractable 02/09/2015  . Adult BMI 30+ 02/09/2015  . Hypertensive pulmonary vascular disease ([place]) 02/09/2015  . Allergic rhinitis, seasonal 02/09/2015  . Vitamin D deficiency 02/09/2015  . History of mitral valve repair 10/01/2012  . History of aortic  valve replacement 10/01/2012  . Congestive heart failure ([place]) 10/01/2012    Jomarie Longs PT 05/29/2016, 8:22 PM  [facility] [address], [place], [address] Phone: [phone]   Fax:  [phone]  Name: MONEKA ZEINERT MRN: [id number] Date of Birth: 04-11-1941

## 2016-06-03 ENCOUNTER — Ambulatory Visit: Payer: Medicare Other | Admitting: Physical Therapy

## 2016-06-05 ENCOUNTER — Ambulatory Visit: Payer: Medicare Other | Admitting: Physical Therapy

## 2016-06-05 ENCOUNTER — Encounter: Payer: Self-pay | Admitting: Physical Therapy

## 2016-06-05 DIAGNOSIS — R262 Difficulty in walking, not elsewhere classified: Secondary | ICD-10-CM | POA: Diagnosis not present

## 2016-06-05 DIAGNOSIS — M6281 Muscle weakness (generalized): Secondary | ICD-10-CM

## 2016-06-05 DIAGNOSIS — M25552 Pain in left hip: Secondary | ICD-10-CM | POA: Diagnosis not present

## 2016-06-06 NOTE — Therapy (Signed)
[facility] [address], [place], [address] Phone: [phone]   Fax:  [phone]  Physical Therapy Treatment  Patient Details  Name: [name] MRN: [id number] Date of Birth: 12/09/1940 Referring Provider: [name] MD  Encounter Date: 06/05/2016      PT End of Session - 06/06/16 2156    Visit Number 7   Number of Visits 18   Date for PT Re-Evaluation 07/17/16   Authorization Type 7   Authorization Time Period 10 (G-code)   PT Start Time 1812   PT Stop Time 1845   PT Time Calculation (min) 33 min   Activity Tolerance Patient tolerated treatment well   Behavior During Therapy [facility] for tasks assessed/performed      Past Medical History:  Diagnosis Date  . CHF (congestive heart failure) ([place])   . COPD (chronic obstructive pulmonary disease) ([place])   . Hyperlipidemia     Past Surgical History:  Procedure Laterality Date  . ABDOMINAL HYSTERECTOMY  1996  . CARDIAC VALVE REPLACEMENT  2014    There were no vitals filed for this visit.      Subjective Assessment - 06/05/16 1815    Subjective Patient reports her left hip pain has improved and today she is having ankle swelling and pain, like gout symptoms. She is feeling less pain and is not confident with progressing exercises correctly. She feels she requires assistance and guidance to perform exercises correctly and would like to continue with physical therapy.    Limitations Lifting;Standing;Walking;House hold activities   Patient Stated Goals pain to go away to be able to walk and stand and lift without pain   Currently in Pain? No/denies  no left hip pain      Objective: Gait; ambulating independently without obvious deviations Strength: left hip abduction 4-/5, ER 4/5 Outcome measure: 54/80 (was 40/80)  Treatment:patient performed exercises with guidance, verbal and tactile cues and demonstration of PT: Supine  lying: Bridging x 5 with controlled motion, stabilization  With 55cm exercise ball:  Hip and knee flexion with TrA contraction x 15 LTR x 20 Instruction in home program with physioball   Patient response to treatment; patient required repeated instruction, VC to perform exercises with correct technique/alignment of hip/trunk          PT Education - 06/05/16 1845    Education provided Yes   Education Details HEP; using physioball for LTR and hip/knee flexion and dead bug with alternating arm/leg    Person(s) Educated Patient   Methods Explanation;Tactile cues;Verbal cues   Comprehension Verbalized understanding;Returned demonstration;Verbal cues required;Tactile cues required             PT Long Term Goals - 06/05/16 1900      PT LONG TERM GOAL #1   Title Patient will demonstrate improved function with walking, daily tasks as indicated by LEFS of 50/64 by 06/04/2016   Baseline LEFS 54/80   Status Achieved     PT LONG TERM GOAL #2   Title Patient will be independent with home exercises without cuing for improving flexibilty and strength left LE and right UE by 07/17/2016 in order to continue to improve and return to PLOF at discharge from PT   Baseline limited knowledge of pain control strategies, progression of exercises without guidance and instruction   Status Revised               Plan - 06/05/16 1850    Clinical Impression  Statement Patient is progressing well with exercises with improvement noted with strength and decreased pain. She continues with left hip and core weakness and requires guidance and assistance to perform appropriate exercises with correct technique and alignment of trunk/hips. She is not confident with home program and will therefore require additional physical therapy intervention to achieve independence with home exercises.   Rehab Potential Good   Clinical Impairments Affecting Rehab Potential (+) acute condition, motivated  (-) chronic  condition   PT Frequency 1x / week   PT Duration 6 weeks   PT Treatment/Interventions Iontophoresis 4mg /ml Dexamethasone;Patient/family education;Electrical Stimulation;Cryotherapy;Neuromuscular re-education;Manual techniques;Therapeutic exercise;Ultrasound;Moist Heat   PT Next Visit Plan pain control modalities, manual therapy techniques, therapeutic exercise for UE and LE   PT Home Exercise Plan continue with stretches, strengthening exercises as instructed      Patient will benefit from skilled therapeutic intervention in order to improve the following deficits and impairments:     Visit Diagnosis: Muscle weakness (generalized) - Plan: PT plan of care cert/re-cert  Difficulty in walking, not elsewhere classified - Plan: PT plan of care cert/re-cert     Problem List Patient Active Problem List   Diagnosis Date Noted  . Chronic systolic heart failure ([place]) 10/08/2015  . Chronic obstructive pulmonary disease ([place]) 07/04/2015  . Controlled gout 06/06/2015  . Anxiety 02/09/2015  . Synovial cyst of popliteal space 02/09/2015  . Benign hypertension 02/09/2015  . Blood type A+ 02/09/2015  . Arterial branch occlusion of retina 02/09/2015  . Chronic constipation 02/09/2015  . Insomnia, persistent 02/09/2015  . Chronic combined systolic and diastolic CHF, NYHA class 1 ([place]) 02/09/2015  . Compromised kidney function 02/09/2015  . Dyslipidemia 02/09/2015  . Atrial fibrillation ([place]) 02/09/2015  . Arthritis urica 02/09/2015  . Hearing loss 02/09/2015  . History of open heart surgery 02/09/2015  . Chronic hoarseness 02/09/2015  . Cardiomegaly 02/09/2015  . Dysmetabolic syndrome 0000000  . Migraine with aura and without status migrainosus, not intractable 02/09/2015  . Adult BMI 30+ 02/09/2015  . Hypertensive pulmonary vascular disease ([place]) 02/09/2015  . Allergic rhinitis, seasonal 02/09/2015  . Vitamin D deficiency 02/09/2015  . History of mitral valve repair 10/01/2012  .  History of aortic valve replacement 10/01/2012  . Congestive heart failure ([place]) 10/01/2012    Jomarie Longs PT 06/06/2016, 10:26 PM  [facility] [address] [place], [address] Phone: [phone]   Fax:  [phone]  Name: [name] MRN: [id number] Date of Birth: April 02, 1941

## 2016-06-10 ENCOUNTER — Encounter: Payer: Medicare Other | Admitting: Physical Therapy

## 2016-06-11 ENCOUNTER — Other Ambulatory Visit: Payer: Self-pay | Admitting: Family Medicine

## 2016-06-12 ENCOUNTER — Encounter: Payer: Self-pay | Admitting: Physical Therapy

## 2016-06-12 ENCOUNTER — Ambulatory Visit: Payer: Medicare Other | Attending: Family Medicine | Admitting: Physical Therapy

## 2016-06-12 DIAGNOSIS — R262 Difficulty in walking, not elsewhere classified: Secondary | ICD-10-CM

## 2016-06-12 DIAGNOSIS — M6281 Muscle weakness (generalized): Secondary | ICD-10-CM

## 2016-06-12 NOTE — Telephone Encounter (Signed)
Patient requesting refill of [facility].

## 2016-06-13 NOTE — Therapy (Signed)
[facility] [address] [place], [address] Phone: [phone]   Fax:  [phone]  Physical Therapy Treatment  Patient Details  Name: [name] MRN: [id number] Date of Birth: 02/08/1941 Referring Provider: [name] MD  Encounter Date: 06/12/2016      PT End of Session - 06/12/16 1616    Visit Number 8   Number of Visits 18   Date for PT Re-Evaluation 07/17/16   Authorization Type 8   Authorization Time Period 10 (G-code)   PT Start Time 1536   PT Stop Time 1616   PT Time Calculation (min) 40 min   Activity Tolerance Patient tolerated treatment well   Behavior During Therapy [facility] for tasks assessed/performed      Past Medical History:  Diagnosis Date  . CHF (congestive heart failure) ([place])   . COPD (chronic obstructive pulmonary disease) ([place])   . Hyperlipidemia     Past Surgical History:  Procedure Laterality Date  . ABDOMINAL HYSTERECTOMY  1996  . CARDIAC VALVE REPLACEMENT  2014    There were no vitals filed for this visit.      Subjective Assessment - 06/12/16 1539    Subjective Patient reports she is recovering from gout episode in left foot. left hip doing well.    Limitations Lifting;Standing;Walking;House hold activities   Patient Stated Goals pain to go away to be able to walk and stand and lift without pain   Currently in Pain? No/denies     Objective: Gait: ambulating independently without obvious gait deviations Strength: left LE decreased strength left hip abduction, ER 4-/5 as compared to right LE; decreased core control with exercises  Treatment:patient performed exercises with guidance, verbal and tactile cues and demonstration of PT: Supine lying: Bridging x 10 with controlled motion, stabilization through partial ROM  Bridging with red resistive band around thighs: lift, press knee out/in lower x 10  With 45cm exercise ball:  Hip and knee flexion with TrA  contraction x 15 LTR x 20 Side lying:  Hip abduction with assistance to un weight LE x 10 (able to control 7 reps without deviations, required assistance to keep hip and knee aligned for last 3 reps)   Patient response to treatment; Patient required assistance to perform all exercises with correct alignment and to un weight left LE for side lying abduction.           PT Education - 06/12/16 1554    Education provided Yes   Education Details HEP; continue to work with core control exercises in supine, sitting until foot is better   Person(s) Educated Patient   Methods Explanation;Demonstration;Verbal cues   Comprehension Verbalized understanding;Returned demonstration;Verbal cues required             PT Long Term Goals - 06/05/16 1900      PT LONG TERM GOAL #1   Title Patient will demonstrate improved function with walking, daily tasks as indicated by LEFS of 50/64 by 06/04/2016   Baseline LEFS 54/80   Status Achieved     PT LONG TERM GOAL #2   Title Patient will be independent with home exercises without cuing for improving flexibilty and strength left LE and right UE by 07/17/2016 in order to continue to improve and return to PLOF at discharge from PT   Baseline limited knowledge of pain control strategies, progression of exercises without guidance and instruction   Status Revised  Plan - 06/12/16 1618    Clinical Impression Statement patient demosntrated good technique with exercises with improved motor control with all exercises following demonstration and with VC and assistance due to continued weakness left LE and core musculature.   Rehab Potential Good   PT Frequency 1x / week   PT Duration 6 weeks   PT Treatment/Interventions Iontophoresis 4mg /ml Dexamethasone;Patient/family education;Electrical Stimulation;Cryotherapy;Neuromuscular re-education;Manual techniques;Therapeutic exercise;Ultrasound;Moist Heat   PT Next Visit Plan pain control  modalities, manual therapy techniques, therapeutic exercise for UE and LE   PT Home Exercise Plan continue with stretches, strengthening exercises as instructed      Patient will benefit from skilled therapeutic intervention in order to improve the following deficits and impairments:  Decreased strength, Pain, Decreased activity tolerance, Decreased endurance, Increased muscle spasms, Impaired UE functional use, Difficulty walking  Visit Diagnosis: Muscle weakness (generalized)  Difficulty in walking, not elsewhere classified     Problem List Patient Active Problem List   Diagnosis Date Noted  . Chronic systolic heart failure ([place]) 10/08/2015  . Chronic obstructive pulmonary disease ([place]) 07/04/2015  . Controlled gout 06/06/2015  . Anxiety 02/09/2015  . Synovial cyst of popliteal space 02/09/2015  . Benign hypertension 02/09/2015  . Blood type A+ 02/09/2015  . Arterial branch occlusion of retina 02/09/2015  . Chronic constipation 02/09/2015  . Insomnia, persistent 02/09/2015  . Chronic combined systolic and diastolic CHF, NYHA class 1 ([place]) 02/09/2015  . Compromised kidney function 02/09/2015  . Dyslipidemia 02/09/2015  . Atrial fibrillation ([place]) 02/09/2015  . Arthritis urica 02/09/2015  . Hearing loss 02/09/2015  . History of open heart surgery 02/09/2015  . Chronic hoarseness 02/09/2015  . Cardiomegaly 02/09/2015  . Dysmetabolic syndrome 0000000  . Migraine with aura and without status migrainosus, not intractable 02/09/2015  . Adult BMI 30+ 02/09/2015  . Hypertensive pulmonary vascular disease 02/09/2015  . Allergic rhinitis, seasonal 02/09/2015  . Vitamin D deficiency 02/09/2015  . History of mitral valve repair 10/01/2012  . History of aortic valve replacement 10/01/2012  . Congestive heart failure ([place]) 10/01/2012    Jomarie Longs PT 06/13/2016, 6:06 PM  [place] [address] [place], [address] Phone: [phone]   Fax:  [phone]  Name: Kelly DILALLO MRN: [id number] Date of Birth: Feb 24, 1941

## 2016-06-16 ENCOUNTER — Encounter: Payer: Medicare Other | Admitting: Physical Therapy

## 2016-06-18 ENCOUNTER — Encounter: Payer: Medicare Other | Admitting: Physical Therapy

## 2016-06-19 ENCOUNTER — Ambulatory Visit: Payer: Medicare Other | Admitting: Physical Therapy

## 2016-06-19 ENCOUNTER — Encounter: Payer: Self-pay | Admitting: Physical Therapy

## 2016-06-19 DIAGNOSIS — R262 Difficulty in walking, not elsewhere classified: Secondary | ICD-10-CM | POA: Diagnosis not present

## 2016-06-19 DIAGNOSIS — M6281 Muscle weakness (generalized): Secondary | ICD-10-CM | POA: Diagnosis not present

## 2016-06-19 NOTE — Therapy (Signed)
[facility] [address], [place], [address] Phone: [phone]   Fax:  [phone]  Physical Therapy Treatment  Patient Details  Name: [name] MRN: [id number] Date of Birth: Apr 03, 1941 Referring Provider: [name] MD  Encounter Date: 06/19/2016      PT End of Session - 06/19/16 1814    Visit Number 9   Number of Visits 18   Date for PT Re-Evaluation 07/17/16   Authorization Type 9   Authorization Time Period 10 (G-code)   PT Start Time 1805   PT Stop Time 1846   PT Time Calculation (min) 41 min   Activity Tolerance Patient tolerated treatment well   Behavior During Therapy [facility] for tasks assessed/performed      Past Medical History:  Diagnosis Date  . CHF (congestive heart failure) ([place])   . COPD (chronic obstructive pulmonary disease) ([place])   . Hyperlipidemia     Past Surgical History:  Procedure Laterality Date  . ABDOMINAL HYSTERECTOMY  1996  . CARDIAC VALVE REPLACEMENT  2014    There were no vitals filed for this visit.      Subjective Assessment - 06/19/16 1812    Subjective No gout today and left hip continues to improve with no pain in left hip; reports right hip is bothering her today.   Limitations Lifting;Standing;Walking;House hold activities   Patient Stated Goals pain to go away to be able to walk and stand and lift without pain   Currently in Pain? No/denies        Objective: Gait: ambulating independently without obvious gait deviations Strength: left LE decreased strength left hip abduction, ER 4-/5 as compared to right LE; decreased core control with exercises  Treatment:patient performed exercises with guidance, verbal and tactile cues and demonstration of PT: Supine lying: Bridging x 15with ball between knees and controlled motion, stabilization through partial ROM  Hip stretches with assistance of therapist for hamstring, hip rotations x 3-5 reps  each Hip flexor stretch over edge of table for left LE to decreased spasms in thigh Hook lying ER with manual resistance x 5 reps to left LE Standing side stepping on airex balance beam x 2 min. Forward, backward walking x 2 min.   Patient response to treatment; Patient limited by spasms in left inner thigh, better with standing exercises today. Able to perform all exercises with guidance, assistance of therapist and minimal VC.          PT Education - 06/19/16 1838    Education provided Yes   Education Details HEP: continue with supne and standing exercises for core control and hip strengthening   Person(s) Educated Patient   Methods Explanation;Demonstration;Verbal cues   Comprehension Verbalized understanding;Returned demonstration;Verbal cues required             PT Long Term Goals - 06/05/16 1900      PT LONG TERM GOAL #1   Title Patient will demonstrate improved function with walking, daily tasks as indicated by LEFS of 50/64 by 06/04/2016   Baseline LEFS 54/80   Status Achieved     PT LONG TERM GOAL #2   Title Patient will be independent with home exercises without cuing for improving flexibilty and strength left LE and right UE by 07/17/2016 in order to continue to improve and return to PLOF at discharge from PT   Baseline limited knowledge of pain control strategies, progression of exercises without guidance and instruction   Status Revised  Plan - 06/19/16 1845    Clinical Impression Statement Patient with improved technique and motor control with all exercises with minimal VC. She continues with decreased flexiblity and strength and will require additional physical therapy intervention to achieve goals and be able to transition to independent home program.    Rehab Potential Good   PT Frequency 1x / week   PT Duration 6 weeks   PT Treatment/Interventions Iontophoresis 4mg /ml Dexamethasone;Patient/family education;Electrical  Stimulation;Cryotherapy;Neuromuscular re-education;Manual techniques;Therapeutic exercise;Ultrasound;Moist Heat   PT Next Visit Plan pain control modalities, manual therapy techniques, therapeutic exercise for UE and LE   PT Home Exercise Plan continue with stretches, strengthening and core control exercises as instructed      Patient will benefit from skilled therapeutic intervention in order to improve the following deficits and impairments:  Decreased strength, Pain, Decreased activity tolerance, Decreased endurance, Increased muscle spasms, Impaired UE functional use, Difficulty walking  Visit Diagnosis: Muscle weakness (generalized)  Difficulty in walking, not elsewhere classified     Problem List Patient Active Problem List   Diagnosis Date Noted  . Chronic systolic heart failure ([place]) 10/08/2015  . Chronic obstructive pulmonary disease ([place]) 07/04/2015  . Controlled gout 06/06/2015  . Anxiety 02/09/2015  . Synovial cyst of popliteal space 02/09/2015  . Benign hypertension 02/09/2015  . Blood type A+ 02/09/2015  . Arterial branch occlusion of retina 02/09/2015  . Chronic constipation 02/09/2015  . Insomnia, persistent 02/09/2015  . Chronic combined systolic and diastolic CHF, NYHA class 1 ([place]) 02/09/2015  . Compromised kidney function 02/09/2015  . Dyslipidemia 02/09/2015  . Atrial fibrillation ([place]) 02/09/2015  . Arthritis urica 02/09/2015  . Hearing loss 02/09/2015  . History of open heart surgery 02/09/2015  . Chronic hoarseness 02/09/2015  . Cardiomegaly 02/09/2015  . Dysmetabolic syndrome 0000000  . Migraine with aura and without status migrainosus, not intractable 02/09/2015  . Adult BMI 30+ 02/09/2015  . Hypertensive pulmonary vascular disease 02/09/2015  . Allergic rhinitis, seasonal 02/09/2015  . Vitamin D deficiency 02/09/2015  . History of mitral valve repair 10/01/2012  . History of aortic valve replacement 10/01/2012  . Congestive heart failure  ([place]) 10/01/2012    Jomarie Longs PT 06/20/2016, 7:52 PM  [place] [facility] [address] [place], [address] Phone: [phone]   Fax:  [phone]  Name: [name] MRN: [id number] Date of Birth: 08/30/1941

## 2016-06-20 ENCOUNTER — Other Ambulatory Visit: Payer: Self-pay | Admitting: Family Medicine

## 2016-06-20 NOTE — Telephone Encounter (Signed)
Patient requesting [facility]. 

## 2016-06-24 ENCOUNTER — Encounter: Payer: Medicare Other | Admitting: Physical Therapy

## 2016-06-26 ENCOUNTER — Ambulatory Visit: Payer: Medicare Other | Admitting: Physical Therapy

## 2016-07-03 ENCOUNTER — Encounter: Payer: Self-pay | Admitting: Physical Therapy

## 2016-07-03 ENCOUNTER — Ambulatory Visit: Payer: Medicare Other | Admitting: Physical Therapy

## 2016-07-03 DIAGNOSIS — M6281 Muscle weakness (generalized): Secondary | ICD-10-CM

## 2016-07-03 DIAGNOSIS — R262 Difficulty in walking, not elsewhere classified: Secondary | ICD-10-CM

## 2016-07-04 NOTE — Therapy (Signed)
[facility] [address], [place], [address] Phone: [phone]   Fax:  [phone]  Physical Therapy Treatment  Patient Details  Name: [name] MRN: [id number] Date of Birth: 09/23/1940 Referring Provider: [name] MD  Encounter Date: 07/03/2016      PT End of Session - 07/03/16 1714    Visit Number 10   Number of Visits 18   Date for PT Re-Evaluation 07/17/16   Authorization Type 10   Authorization Time Period 10 (G-code)   PT Start Time 1710   PT Stop Time 1750   PT Time Calculation (min) 40 min   Activity Tolerance Patient tolerated treatment well   Behavior During Therapy [facility] for tasks assessed/performed      Past Medical History:  Diagnosis Date  . CHF (congestive heart failure) ([place])   . COPD (chronic obstructive pulmonary disease) ([place])   . Hyperlipidemia     Past Surgical History:  Procedure Laterality Date  . ABDOMINAL HYSTERECTOMY  1996  . CARDIAC VALVE REPLACEMENT  2014    There were no vitals filed for this visit.      Subjective Assessment - 07/03/16 1712    Subjective Patient reports she is doing well and no complaints today.   Limitations Lifting;Standing;Walking;House hold activities   Patient Stated Goals pain to go away to be able to walk and stand and lift without pain   Currently in Pain? No/denies      Objective: Gait: ambulating independently without obvious gait deviations Strength: left LE decreased strength left hip abduction, ER 4/5 as compared to right LE; decreased core control with exercises  Treatment:patient performed exercises with guidance, verbal and tactile cues and demonstration of PT: Supine lying: Bridging x 15with ball between knees and controlled motion, stabilization through partial ROM  Hip stretches with assistance of therapist for hamstring, resisted hip extension each LE x 3 reps and AAROM hip/knee flexion x 5 reps, hip  rotations x 3-5 reps each LTR x 20 reps to decreased spasms in left hip/lower back following previous exercise Hip ER with red resistive band with hold end range x 5 seconds x 10 reps, then 15 reps quickly Side lying:  Clamshell with red resistive band x 5 reps left hip, then no resistance x 10 reps Hip abduction with contact guard x 10 reps  Standing side stepping on airex balance beam x 2 min. Sitting: Hip adduction with ball with glute sets x 15 reps Hip abduction with resistance of therapist x 10 reps  Patient response to treatment: Patient demonstrated improved technique with exercises with minimal VC and assistance for correct alignment and intensity modification to decrease risk of spasms.  Improved motor control with repetition and cuing. Moderate fatigue at end of session         PT Education - 07/03/16 1750    Education provided Yes   Education Details HEP: continue with exercises for core, LE strengthening   Person(s) Educated Patient   Methods Explanation   Comprehension Verbalized understanding             PT Long Term Goals - 07/03/16 1800      PT LONG TERM GOAL #1   Title Patient will demonstrate improved function with walking, daily tasks as indicated by LEFS of 50/64 by 06/04/2016   Baseline LEFS 54/80   Status Achieved     PT LONG TERM GOAL #2   Title Patient will be independent with home  exercises without cuing for improving flexibilty and strength left LE and right UE by 07/17/2016 in order to continue to improve and return to PLOF at discharge from PT   Baseline limited knowledge of pain control strategies, progression of exercises without guidance and instruction   Status On-going               Plan - 08-02-16 1800    Clinical Impression Statement Patient demonstrates improvement with flexibiltiy and strength right snd left hips. She continues with intermitent spasms in lower back/hip regions and decreased core control and will benefit from  additional physical therapy intervention to achieve goals for independent with home program for self management. .   Rehab Potential Good   PT Frequency 1x / week   PT Duration 6 weeks   PT Treatment/Interventions Iontophoresis 4mg /ml Dexamethasone;Patient/family education;Electrical Stimulation;Cryotherapy;Neuromuscular re-education;Manual techniques;Therapeutic exercise;Ultrasound;Moist Heat   PT Next Visit Plan pain control modalities, manual therapy techniques, therapeutic exercise for UE and LE   PT Home Exercise Plan continue with stretches, strengthening and core control exercises as instructed      Patient will benefit from skilled therapeutic intervention in order to improve the following deficits and impairments:  Decreased strength, Pain, Decreased activity tolerance, Decreased endurance, Increased muscle spasms, Impaired UE functional use, Difficulty walking  Visit Diagnosis: Muscle weakness (generalized)  Difficulty in walking, not elsewhere classified       G-Codes - 08-02-2016 1800    Functional Assessment Tool Used LEFS, ROM, strength deficits, pain scale, clinical judgment   Functional Limitation Mobility: Walking and moving around   Mobility: Walking and Moving Around Current Status [phone]) At least 20 percent but less than 40 percent impaired, limited or restricted   Mobility: Walking and Moving Around Goal Status [phone]) At least 1 percent but less than 20 percent impaired, limited or restricted      Problem List Patient Active Problem List   Diagnosis Date Noted  . Chronic systolic heart failure ([place]) 10/08/2015  . Chronic obstructive pulmonary disease ([place]) 2015/08/03  . Controlled gout 06/06/2015  . Anxiety 02/09/2015  . Synovial cyst of popliteal space 02/09/2015  . Benign hypertension 02/09/2015  . Blood type A+ 02/09/2015  . Arterial branch occlusion of retina 02/09/2015  . Chronic constipation 02/09/2015  . Insomnia, persistent 02/09/2015  . Chronic  combined systolic and diastolic CHF, NYHA class 1 ([place]) 02/09/2015  . Compromised kidney function 02/09/2015  . Dyslipidemia 02/09/2015  . Atrial fibrillation ([place]) 02/09/2015  . Arthritis urica 02/09/2015  . Hearing loss 02/09/2015  . History of open heart surgery 02/09/2015  . Chronic hoarseness 02/09/2015  . Cardiomegaly 02/09/2015  . Dysmetabolic syndrome 0000000  . Migraine with aura and without status migrainosus, not intractable 02/09/2015  . Adult BMI 30+ 02/09/2015  . Hypertensive pulmonary vascular disease 02/09/2015  . Allergic rhinitis, seasonal 02/09/2015  . Vitamin D deficiency 02/09/2015  . History of mitral valve repair 10/01/2012  . History of aortic valve replacement 10/01/2012  . Congestive heart failure ([place]) 10/01/2012    Jomarie Longs PT 07/04/2016, 5:06 PM  [facility] [address] [place], [address] Phone: [phone]   Fax:  [phone]  Name: [name] MRN: [id number] Date of Birth: 04-07-1941

## 2016-07-10 ENCOUNTER — Encounter: Payer: Self-pay | Admitting: Physical Therapy

## 2016-07-10 ENCOUNTER — Ambulatory Visit: Payer: Medicare Other | Attending: Family Medicine | Admitting: Physical Therapy

## 2016-07-10 DIAGNOSIS — M6281 Muscle weakness (generalized): Secondary | ICD-10-CM | POA: Insufficient documentation

## 2016-07-10 DIAGNOSIS — R262 Difficulty in walking, not elsewhere classified: Secondary | ICD-10-CM | POA: Diagnosis not present

## 2016-07-10 NOTE — Therapy (Signed)
[facility] [address], [place], [address] Phone: [phone]   Fax:  [phone]  Physical Therapy Treatment  Patient Details  Name: [name] MRN: [id number] Date of Birth: 1941/06/04 Referring Provider: [name] MD  Encounter Date: 07/10/2016      PT End of Session - 07/10/16 1716    Visit Number 11   Number of Visits 18   Date for PT Re-Evaluation 07/17/16   Authorization Type 11   Authorization Time Period 20 (G-code)   PT Start Time 1715   PT Stop Time 1744   PT Time Calculation (min) 29 min   Activity Tolerance Patient tolerated treatment well   Behavior During Therapy [facility] for tasks assessed/performed      Past Medical History:  Diagnosis Date  . CHF (congestive heart failure) ([place])   . COPD (chronic obstructive pulmonary disease) ([place])   . Hyperlipidemia     Past Surgical History:  Procedure Laterality Date  . ABDOMINAL HYSTERECTOMY  1996  . CARDIAC VALVE REPLACEMENT  2014    There were no vitals filed for this visit.      Subjective Assessment - 07/10/16 1715    Subjective Doing better without hip pain or cramping today. She is drinking more to stay hydrated and feels this is helping.    Limitations Lifting;Standing;Walking;House hold activities   Patient Stated Goals pain to go away to be able to walk and stand and lift without pain   Currently in Pain? No/denies        Objective: Gait: ambulating independently without deviations Strength: left LE left hip abduction, ER 4/5 as compared to right LE; decreased core control with exercises, improved fro   Treatment:patient performed exercises with guidance, verbal and tactile cues and demonstration of PT: Supine lying: Hip stretches with assistance of therapist for hamstring, hip/knee flexion x 5 reps, hip rotations x 3-5 reps each Bridging x 15with ball between knees andcontrolled motion, stabilization through  partial ROM  LTR x 20 reps with controlled motion, 2 min. Hip ER with red resistive band with hold end range x 5 seconds x 10 reps Side lying:  Clamshell without resistance x 10 reps left hip Hip abduction with tactile cues for proper hip/knee alignment x 10 reps  Sitting: Hip adduction with ball with glute sets x 15 reps  Patient response to treatment: Patient demonstrated improved technique with exercises with minimal VC and assistance for correct alignment.  Improved motor control with repetition and cuing. mild fatigue at end of session         PT Education - 07/10/16 1716    Education provided Yes: re assessed home program for stabilization   Person(s) Educated Patient   Methods Explanation;Demonstration;Verbal cues   Comprehension Verbalized understanding;Returned demonstration;Verbal cues required             PT Long Term Goals - 07/03/16 1800      PT LONG TERM GOAL #1   Title Patient will demonstrate improved function with walking, daily tasks as indicated by LEFS of 50/64 by 06/04/2016   Baseline LEFS 54/80   Status Achieved     PT LONG TERM GOAL #2   Title Patient will be independent with home exercises without cuing for improving flexibilty and strength left LE and right UE by 07/17/2016 in order to continue to improve and return to PLOF at discharge from PT   Baseline limited knowledge of pain control strategies, progression of exercises  without guidance and instruction   Status On-going               Plan - 07/10/16 1745    Clinical Impression Statement Patient progressing well with improving flexibiltiy and strength and knowledge of appropriate exercises to continue with at home. She continues to require some guidance and will benefit from additional physical therapy instruction to be able to transition to independent home program.    Rehab Potential Good   PT Frequency 1x / week   PT Duration 6 weeks   PT Treatment/Interventions Iontophoresis  4mg /ml Dexamethasone;Patient/family education;Electrical Stimulation;Cryotherapy;Neuromuscular re-education;Manual techniques;Therapeutic exercise;Ultrasound;Moist Heat   PT Next Visit Plan pain control modalities, manual therapy techniques, therapeutic exercise for UE and LE   PT Home Exercise Plan continue with stretches, strengthening and core control exercises as instructed      Patient will benefit from skilled therapeutic intervention in order to improve the following deficits and impairments:  Decreased strength, Pain, Decreased activity tolerance, Decreased endurance, Increased muscle spasms, Impaired UE functional use, Difficulty walking  Visit Diagnosis: Muscle weakness (generalized)  Difficulty in walking, not elsewhere classified     Problem List Patient Active Problem List   Diagnosis Date Noted  . Chronic systolic heart failure ([place]) 10/08/2015  . Chronic obstructive pulmonary disease ([place]) 07/04/2015  . Controlled gout 06/06/2015  . Anxiety 02/09/2015  . Synovial cyst of popliteal space 02/09/2015  . Benign hypertension 02/09/2015  . Blood type A+ 02/09/2015  . Arterial branch occlusion of retina 02/09/2015  . Chronic constipation 02/09/2015  . Insomnia, persistent 02/09/2015  . Chronic combined systolic and diastolic CHF, NYHA class 1 ([place]) 02/09/2015  . Compromised kidney function 02/09/2015  . Dyslipidemia 02/09/2015  . Atrial fibrillation ([place]) 02/09/2015  . Arthritis urica 02/09/2015  . Hearing loss 02/09/2015  . History of open heart surgery 02/09/2015  . Chronic hoarseness 02/09/2015  . Cardiomegaly 02/09/2015  . Dysmetabolic syndrome 0000000  . Migraine with aura and without status migrainosus, not intractable 02/09/2015  . Adult BMI 30+ 02/09/2015  . Hypertensive pulmonary vascular disease 02/09/2015  . Allergic rhinitis, seasonal 02/09/2015  . Vitamin D deficiency 02/09/2015  . History of mitral valve repair 10/01/2012  . History of aortic  valve replacement 10/01/2012  . Congestive heart failure ([place]) 10/01/2012    Jomarie Longs PT 07/11/2016, 9:44 PM  [facility] [address] [place], [address] Phone: [phone]   Fax:  [phone]  Name: [name] MRN: [id number] Date of Birth: 01-Dec-1940

## 2016-07-11 DIAGNOSIS — I480 Paroxysmal atrial fibrillation: Secondary | ICD-10-CM | POA: Diagnosis not present

## 2016-07-17 ENCOUNTER — Ambulatory Visit: Payer: Medicare Other | Admitting: Physical Therapy

## 2016-08-08 ENCOUNTER — Other Ambulatory Visit: Payer: Self-pay | Admitting: Family Medicine

## 2016-08-08 NOTE — Telephone Encounter (Signed)
Patient requesting refill of Klor-Con to [facility].  

## 2016-08-12 ENCOUNTER — Ambulatory Visit (INDEPENDENT_AMBULATORY_CARE_PROVIDER_SITE_OTHER): Payer: Medicare Other | Admitting: Family Medicine

## 2016-08-12 ENCOUNTER — Encounter: Payer: Self-pay | Admitting: Family Medicine

## 2016-08-12 VITALS — BP 138/68 | HR 72 | Temp 98.1°F | Resp 16 | Ht 64.0 in | Wt 179.6 lb

## 2016-08-12 DIAGNOSIS — E8881 Metabolic syndrome: Secondary | ICD-10-CM | POA: Diagnosis not present

## 2016-08-12 DIAGNOSIS — E559 Vitamin D deficiency, unspecified: Secondary | ICD-10-CM

## 2016-08-12 DIAGNOSIS — J410 Simple chronic bronchitis: Secondary | ICD-10-CM

## 2016-08-12 DIAGNOSIS — Z1231 Encounter for screening mammogram for malignant neoplasm of breast: Secondary | ICD-10-CM | POA: Diagnosis not present

## 2016-08-12 DIAGNOSIS — R739 Hyperglycemia, unspecified: Secondary | ICD-10-CM

## 2016-08-12 DIAGNOSIS — I5042 Chronic combined systolic (congestive) and diastolic (congestive) heart failure: Secondary | ICD-10-CM

## 2016-08-12 DIAGNOSIS — G43109 Migraine with aura, not intractable, without status migrainosus: Secondary | ICD-10-CM | POA: Diagnosis not present

## 2016-08-12 DIAGNOSIS — M109 Gout, unspecified: Secondary | ICD-10-CM | POA: Diagnosis not present

## 2016-08-12 DIAGNOSIS — I480 Paroxysmal atrial fibrillation: Secondary | ICD-10-CM | POA: Diagnosis not present

## 2016-08-12 DIAGNOSIS — E785 Hyperlipidemia, unspecified: Secondary | ICD-10-CM | POA: Diagnosis not present

## 2016-08-12 DIAGNOSIS — I10 Essential (primary) hypertension: Secondary | ICD-10-CM

## 2016-08-12 DIAGNOSIS — I4891 Unspecified atrial fibrillation: Secondary | ICD-10-CM | POA: Diagnosis not present

## 2016-08-12 DIAGNOSIS — Z1239 Encounter for other screening for malignant neoplasm of breast: Secondary | ICD-10-CM

## 2016-08-12 MED ORDER — EZETIMIBE 10 MG PO TABS: 10.0000 mg | ORAL_TABLET | Freq: Every day | ORAL | 3 refills | Status: DC

## 2016-08-12 NOTE — Addendum Note (Signed)
Addended by: Lolita Rieger D on: 08/12/2016 02:47 PM   Modules accepted: Orders

## 2016-08-12 NOTE — Progress Notes (Signed)
Name: [name]   MRN: [id number]    DOB: 02-Feb-1941   Date:08/12/2016       Progress Note  Subjective  Chief Complaint  Chief Complaint  Patient presents with  . Medication Refill    HPI  Migraine headaches: she has occasional episodes of aura, described as scotomas and recently not followed by a headache. Usually triggered by not sleeping well or skipping a meal, but improves with rest or when she eats food  Chronic combined CHF: she is on Demadex and potassium, digoxin and beta-blocker . No chest or SOB, she denies orthopnea. She has been compliant with medication. She is not on ACE because of cough, she was on Losartan for a period of time but bp dropped so cardiologist, Dr. Clayborn Bigness stopped medication.   HTN: taking metoprolol, bp much better controlled since aortic valve replacement. No chest pain , no current SOB  Hyperlipidemia:she was taking Lipitor, but it was causing muscle aches and she stopped taking medication about 9 months ago. She is willing to try something for cholesterol that is not a statin.  Insomnia: sleeping well, only taking Trazodone prn, and denies snoring, wakes up feeling refreshed.  She still has medication at home  Gout: not recent symptoms, uric acid was 12 in June. She has been on  Allopurinol and last uric acid was at goal.   Dysmetabolic Syndrome: last [id number] 5.8%, she is not very compliant with diet. She has been physically active, walking daily . Denies polyphagia, polydipsia  or polyuria  Afib: rate controlled, no fluttering sensation, taking betablocker and digoxin, also on coumadin and goes to coumadin clinic/Dr. Callwood   COPD : she is on Spiriva, currently doing well, no cough or SOB. She states no longer has a productive cough in am's   Vertigo: resolved, currently just has occasionally light headeness when she get sup slowly  Gout: no recent episodes, has not been taking Allopurinol daily we will recheck levels  Patient  Active Problem List   Diagnosis Date Noted  . Chronic systolic heart failure ([place]) 10/08/2015  . Chronic obstructive pulmonary disease ([place]) 07/04/2015  . Controlled gout 06/06/2015  . Anxiety 02/09/2015  . Synovial cyst of popliteal space 02/09/2015  . Benign hypertension 02/09/2015  . Blood type A+ 02/09/2015  . Arterial branch occlusion of retina 02/09/2015  . Chronic constipation 02/09/2015  . Insomnia, persistent 02/09/2015  . Chronic combined systolic and diastolic CHF, NYHA class 1 ([place]) 02/09/2015  . Compromised kidney function 02/09/2015  . Dyslipidemia 02/09/2015  . Atrial fibrillation ([place]) 02/09/2015  . Arthritis urica 02/09/2015  . Hearing loss 02/09/2015  . History of open heart surgery 02/09/2015  . Chronic hoarseness 02/09/2015  . Cardiomegaly 02/09/2015  . Dysmetabolic syndrome 0000000  . Migraine with aura and without status migrainosus, not intractable 02/09/2015  . Adult BMI 30+ 02/09/2015  . Hypertensive pulmonary vascular disease 02/09/2015  . Allergic rhinitis, seasonal 02/09/2015  . Vitamin D deficiency 02/09/2015  . History of mitral valve repair 10/01/2012  . History of aortic valve replacement 10/01/2012  . Congestive heart failure ([place]) 10/01/2012    Past Surgical History:  Procedure Laterality Date  . ABDOMINAL HYSTERECTOMY  1996  . CARDIAC VALVE REPLACEMENT  2014    Family History  Problem Relation Age of Onset  . Cancer Mother   . Diabetes Mother   . Hypertension Father   . Hypertension Mother   . Heart failure Father   . Cancer Brother     bladder  .  Cancer Sister     breast  . Hypertension Son   . Hypertension Daughter     Social History   Social History  . Marital status: Widowed    Spouse name: N/A  . Number of children: 2  . Years of education: N/A   Occupational History  . Custodian    Social History Main Topics  . Smoking status: Former Smoker    Quit date: 09/09/2007  . Smokeless tobacco: Never Used  . Alcohol  use No  . Drug use: No  . Sexual activity: Not Currently   Other Topics Concern  . Not on file   Social History Narrative  . No narrative on file     Current Outpatient Prescriptions:  .  acetaminophen (TYLENOL) 500 MG tablet, Take 1,000-1,500 mg by mouth every 6 (six) hours as needed for mild pain., Disp: , Rfl:  .  allopurinol (ZYLOPRIM) 300 MG tablet, TAKE 1 TABLET BY MOUTH DAILY, Disp: 90 tablet, Rfl: 1 .  aspirin EC 81 MG tablet, Take 81 mg by mouth every evening., Disp: , Rfl:  .  COLCRYS 0.6 MG tablet, TAKE 2 TABS BY MOUTH AT ONSET OF PAIN. REPEAT WITH 1 TAB IN 1 HOUR. *MAX 3TABS/24 HOURS*, Disp: 30 tablet, Rfl: 1 .  diazepam (VALIUM) 5 MG tablet, TAKE 1 TABLET BY MOUTH AS NEEDED, Disp: 30 tablet, Rfl: 0 .  digoxin (LANOXIN) 0.125 MG tablet, TAKE 1 TABLET (0.125 MG TOTAL) BY MOUTH ONCE DAILY., Disp: , Rfl: 6 .  fluticasone (FLONASE) 50 MCG/ACT nasal spray, Place 2 sprays into both nostrils daily., Disp: 16 g, Rfl: 6 .  KLOR-CON M20 20 MEQ tablet, TAKE 1 TABLET BY MOUTH ONCE A DAY, Disp: 90 tablet, Rfl: 1 .  loratadine (CLARITIN) 10 MG tablet, Take 10 mg by mouth daily as needed for allergies., Disp: , Rfl:  .  metoprolol tartrate (LOPRESSOR) 25 MG tablet, Take 1 tablet (25 mg total) by mouth 2 (two) times daily., Disp: 60 tablet, Rfl: 3 .  SPIRIVA HANDIHALER 18 MCG inhalation capsule, INHALE 1 CASPULE VIA HANDIHALER ONCE A DAY AT THE SAME TIME EVERY DAY, Disp: 90 capsule, Rfl: 3 .  torsemide (DEMADEX) 20 MG tablet, TAKE 1 TABLET (20 MG TOTAL) BY MOUTH DAILY., Disp: 30 tablet, Rfl: 2 .  traZODone (DESYREL) 50 MG tablet, TAKE 1 TO 2 TABLETS BY MOUTH AT BEDTIME, Disp: 60 tablet, Rfl: 0 .  Vitamin D, Ergocalciferol, (DRISDOL) 50000 units CAPS capsule, TAKE 1 CAPSULE (50,000 UNITS TOTAL) BY MOUTH EVERY 7 (SEVEN) DAYS., Disp: 12 capsule, Rfl: 1 .  warfarin (COUMADIN) 5 MG tablet, Take 5-7.5 mg by mouth every evening. Pt takes one tablet Sunday, Monday, Wednesday, and Friday.  Pt takes one  and one-half tablet on Tuesday, Thursday, and Saturday., Disp: , Rfl:   Allergies  Allergen Reactions  . Ace Inhibitors Cough  . Amoxicillin-Pot Clavulanate Nausea And Vomiting  . Codeine Nausea And Vomiting  . Penicillins Nausea And Vomiting and Other (See Comments)    Has patient had a PCN reaction causing immediate rash, facial/tongue/throat swelling, SOB or lightheadedness with hypotension: No Has patient had a PCN reaction causing severe rash involving mucus membranes or skin necrosis: No Has patient had a PCN reaction that required hospitalization No Has patient had a PCN reaction occurring within the last 10 years: No If all of the above answers are "NO", then may proceed with Cephalosporin use.  . Rosuvastatin Other (See Comments)    Reaction:  Joint pain   .  Tetanus-Diphtheria Toxoids Td Swelling     ROS  Constitutional: Negative for fever , positive mild  weight change.  Respiratory: Positive  for cough and shortness of breath.   Cardiovascular: Negative for chest pain, no  palpitations.  Gastrointestinal: Negative for abdominal pain, no bowel changes.  Musculoskeletal: Negative for gait problem or joint swelling.  Skin: Negative for rash.  Neurological: positive for intermittent dizziness ( light head sensation ) but no headache.  No other specific complaints in a complete review of systems (except as listed in HPI above).   Objective  Vitals:   08/12/16 1402  BP: 138/68  Pulse: 72  Resp: 16  Temp: 98.1 F (36.7 C)  TempSrc: Oral  SpO2: 98%  Weight: 179 lb 9 oz (81.4 kg)  Height: 5\' 4"  (1.626 m)    Body mass index is 30.82 kg/m.  Physical Exam  Constitutional: Patient appears well-developed Obese No distress.  HEENT: head atraumatic, normocephalic, pupils equal and reactive to light, right TM shows scar tissue, no nystagmus, neck supple, throat within normal limits Cardiovascular: Irregular  Rhythm , systolic ejection murmur 3/6  No BLE  edema. Pulmonary/Chest: Effort normal and breath sounds normal. No respiratory distress. Abdominal: Soft. There is no tenderness. Psychiatric: Patient has a normal mood and affect. behavior is normal. Judgment and thought content normal. Neurological : Awake, alert and oriented. No focal findings   PHQ2/9: Depression screen [facility] 2/9 08/12/2016 04/15/2016 02/14/2016 01/14/2016 01/10/2016  Decreased Interest 0 0 0 0 0  Down, Depressed, Hopeless 0 0 0 0 0  PHQ - 2 Score 0 0 0 0 0  Altered sleeping - - - - -  Tired, decreased energy - - - - -  Change in appetite - - - - -  Feeling bad or failure about yourself  - - - - -  Trouble concentrating - - - - -  Moving slowly or fidgety/restless - - - - -  Suicidal thoughts - - - - -  PHQ-9 Score - - - - -  Difficult doing work/chores - - - - -     Fall Risk: Fall Risk  08/12/2016 04/15/2016 02/14/2016 01/14/2016 01/10/2016  Falls in the past year? No No No No No     Functional Status Survey: Is the patient deaf or have difficulty hearing?: No Does the patient have difficulty seeing, even when wearing glasses/contacts?: No Does the patient have difficulty concentrating, remembering, or making decisions?: No Does the patient have difficulty walking or climbing stairs?: No Does the patient have difficulty dressing or bathing?: No Does the patient have difficulty doing errands alone such as visiting a doctor's office or shopping?: No   Assessment & Plan  1. Migraine with aura and without status migrainosus, not intractable  No recent episodes  2. Chronic obstructive pulmonary disease (HCC)  Continue current regiment  3. Vitamin D deficiency  On rx vitamin D , explained she can start Vitamin D 2000 units daily   4. Chronic combined systolic and diastolic CHF, NYHA class 1 (HCC)  Doing well at this time  5. Dyslipidemia  - Lipid panel - ezetimibe (ZETIA) 10 MG tablet; Take 1 tablet (10 mg total) by mouth daily.  Dispense: 30 tablet; Refill:  3  6. Hyperglycemia  - Hemoglobin A1c  7. Dysmetabolic syndrome  - Hemoglobin A1c  8. Benign hypertension  - COMPLETE METABOLIC PANEL WITH GFR  9. Paroxysmal atrial fibrillation (HCC)  Rate controlled and no symptoms at this time  10. Controlled  gout  - Uric acid

## 2016-08-15 LAB — COMPLETE METABOLIC PANEL WITH GFR
ALBUMIN: 3.8 g/dL (ref 3.6–5.1)
ALK PHOS: 65 U/L (ref 33–130)
ALT: 31 U/L — AB (ref 6–29)
AST: 27 U/L (ref 10–35)
BILIRUBIN TOTAL: 0.5 mg/dL (ref 0.2–1.2)
BUN: 16 mg/dL (ref 7–25)
CO2: 27 mmol/L (ref 20–31)
CREATININE: 0.8 mg/dL (ref 0.60–0.93)
Calcium: 9.2 mg/dL (ref 8.6–10.4)
Chloride: 105 mmol/L (ref 98–110)
GFR, EST AFRICAN AMERICAN: 85 mL/min (ref >=60)
GFR, EST NON AFRICAN AMERICAN: 73 mL/min (ref >=60)
GLUCOSE: 100 mg/dL — AB (ref 65–99)
Potassium: 4 mmol/L (ref 3.5–5.3)
SODIUM: 145 mmol/L (ref 135–146)
TOTAL PROTEIN: 6.5 g/dL (ref 6.1–8.1)

## 2016-08-15 LAB — HEMOGLOBIN A1C
Hgb A1c MFr Bld: 5.4 % (ref <5.7)
Mean Plasma Glucose: 108 mg/dL

## 2016-08-15 LAB — LIPID PANEL
Cholesterol: 253 mg/dL — ABNORMAL HIGH (ref <200)
HDL: 44 mg/dL — AB (ref >50)
LDL Cholesterol: 180 mg/dL — ABNORMAL HIGH (ref <100)
TRIGLYCERIDES: 147 mg/dL (ref <150)
Total CHOL/HDL Ratio: 5.8 Ratio — ABNORMAL HIGH (ref <5.0)
VLDL: 29 mg/dL (ref <30)

## 2016-08-15 LAB — URIC ACID: URIC ACID, SERUM: 12.7 mg/dL — AB (ref 2.5–7.0)

## 2016-08-20 ENCOUNTER — Other Ambulatory Visit: Payer: Self-pay | Admitting: Family Medicine

## 2016-08-20 NOTE — Telephone Encounter (Signed)
Patient [facility] 

## 2016-08-26 ENCOUNTER — Telehealth: Payer: Self-pay | Admitting: Family Medicine

## 2016-08-26 NOTE — Telephone Encounter (Signed)
Patient called wanting to know the results of her last blood work.

## 2016-08-26 NOTE — Telephone Encounter (Signed)
Uric acid is out of control. I will change to Uloric if she has been taking Allopurinol daily  Fasting glucose and hgbA1C at goal.  Normal kidney function and liver function has normalized  Lipid panel is not at goal. LDL is very high.  Is she taking Crestor daily ?

## 2016-08-27 NOTE — Telephone Encounter (Signed)
Called patient on 08/26/16 @ 4:45pm and informed patient of her test results.  Patient stated that she hasn't taken Allopurinol in the last 3 weeks but she will start back taking due to her uric acid being out of control.  Patient also stated that she has not been taking the Ezetimibe 10mg  that was prescribed to but  that she will start.

## 2016-09-12 DIAGNOSIS — H04223 Epiphora due to insufficient drainage, bilateral lacrimal glands: Secondary | ICD-10-CM | POA: Diagnosis not present

## 2016-09-16 ENCOUNTER — Other Ambulatory Visit: Payer: Self-pay | Admitting: Family Medicine

## 2016-09-16 DIAGNOSIS — I480 Paroxysmal atrial fibrillation: Secondary | ICD-10-CM

## 2016-09-18 ENCOUNTER — Ambulatory Visit
Admission: RE | Admit: 2016-09-18 | Discharge: 2016-09-18 | Disposition: A | Discharge status: Home / Self Care | Payer: Medicare Other | Source: Ambulatory Visit | Attending: Family Medicine | Admitting: Family Medicine

## 2016-09-18 DIAGNOSIS — Z1231 Encounter for screening mammogram for malignant neoplasm of breast: Secondary | ICD-10-CM | POA: Diagnosis not present

## 2016-09-18 DIAGNOSIS — I480 Paroxysmal atrial fibrillation: Secondary | ICD-10-CM | POA: Diagnosis not present

## 2016-09-18 HISTORY — DX: Malignant neoplasm of unspecified site of unspecified female breast: C50.919

## 2016-10-15 ENCOUNTER — Ambulatory Visit (INDEPENDENT_AMBULATORY_CARE_PROVIDER_SITE_OTHER): Payer: Medicare Other | Admitting: Family Medicine

## 2016-10-15 ENCOUNTER — Encounter: Payer: Self-pay | Admitting: Family Medicine

## 2016-10-15 VITALS — BP 122/68 | HR 80 | Temp 98.5°F | Resp 16 | Ht 64.0 in | Wt 178.1 lb

## 2016-10-15 DIAGNOSIS — K529 Noninfective gastroenteritis and colitis, unspecified: Secondary | ICD-10-CM

## 2016-10-15 MED ORDER — ONDANSETRON HCL 4 MG PO TABS: 4.0000 mg | ORAL_TABLET | Freq: Three times a day (TID) | ORAL | 0 refills | Status: DC | PRN

## 2016-10-15 NOTE — Progress Notes (Signed)
Name: [name]   MRN: [id number]    DOB: 1941-08-20   Date:10/15/2016       Progress Note  Subjective  Chief Complaint  Chief Complaint  Patient presents with  . Nausea    started Monday but pt has not vomitted since Monday just been nauseous and would like work note to return to work  . Emesis    HPI  Gastroenteritis: she developed nausea and vomiting on vomiting, followed by diarrhea - watery stools, no blood. She is feeling better, cramping has improved, but still has bloating sensation and mild nausea. No longer vomiting, last bowel movement was this morning. Missed work all week because she has been sick.   Patient Active Problem List   Diagnosis Date Noted  . Chronic systolic heart failure ([place]) 10/08/2015  . Chronic obstructive pulmonary disease ([place]) 07/04/2015  . Controlled gout 06/06/2015  . Anxiety 02/09/2015  . Synovial cyst of popliteal space 02/09/2015  . Benign hypertension 02/09/2015  . Blood type A+ 02/09/2015  . Arterial branch occlusion of retina 02/09/2015  . Chronic constipation 02/09/2015  . Insomnia, persistent 02/09/2015  . Chronic combined systolic and diastolic CHF, NYHA class 1 ([place]) 02/09/2015  . Compromised kidney function 02/09/2015  . Dyslipidemia 02/09/2015  . Atrial fibrillation ([place]) 02/09/2015  . Arthritis urica 02/09/2015  . Hearing loss 02/09/2015  . History of open heart surgery 02/09/2015  . Chronic hoarseness 02/09/2015  . Cardiomegaly 02/09/2015  . Dysmetabolic syndrome 0000000  . Migraine with aura and without status migrainosus, not intractable 02/09/2015  . Adult BMI 30+ 02/09/2015  . Hypertensive pulmonary vascular disease 02/09/2015  . Allergic rhinitis, seasonal 02/09/2015  . Vitamin D deficiency 02/09/2015  . History of mitral valve repair 10/01/2012  . History of aortic valve replacement 10/01/2012  . Congestive heart failure ([place]) 10/01/2012    Past Surgical History:  Procedure Laterality Date  . ABDOMINAL  HYSTERECTOMY  1996  . BREAST BIOPSY Right 1998   neg  . BREAST BIOPSY Right 2007   neg  . BREAST BIOPSY Right 1992   positive  . BREAST EXCISIONAL BIOPSY Left 2000   neg  . CARDIAC VALVE REPLACEMENT  2014    Family History  Problem Relation Age of Onset  . Cancer Mother   . Diabetes Mother   . Hypertension Mother   . Breast cancer Mother 51  . Hypertension Father   . Heart failure Father   . Cancer Brother     bladder  . Cancer Sister     breast  . Breast cancer Sister 39  . Hypertension Son   . Hypertension Daughter   . Breast cancer Sister 49    Social History   Social History  . Marital status: Widowed    Spouse name: N/A  . Number of children: 2  . Years of education: N/A   Occupational History  . Custodian    Social History Main Topics  . Smoking status: Former Smoker    Quit date: 09/09/2007  . Smokeless tobacco: Never Used  . Alcohol use No  . Drug use: No  . Sexual activity: Not Currently   Other Topics Concern  . Not on file   Social History Narrative  . No narrative on file     Current Outpatient Prescriptions:  .  acetaminophen (TYLENOL) 500 MG tablet, Take 1,000-1,500 mg by mouth every 6 (six) hours as needed for mild pain., Disp: , Rfl:  .  allopurinol (ZYLOPRIM) 300 MG tablet, TAKE  1 TABLET BY MOUTH DAILY, Disp: 90 tablet, Rfl: 1 .  aspirin EC 81 MG tablet, Take 81 mg by mouth every evening., Disp: , Rfl:  .  COLCRYS 0.6 MG tablet, TAKE 2 TABS BY MOUTH AT ONSET OF PAIN. REPEAT WITH 1 TAB IN 1 HOUR. *MAX 3TABS/24 HOURS*, Disp: 30 tablet, Rfl: 1 .  diazepam (VALIUM) 5 MG tablet, TAKE 1 TABLET BY MOUTH AS NEEDED, Disp: 30 tablet, Rfl: 0 .  digoxin (LANOXIN) 0.125 MG tablet, TAKE 1 TABLET (0.125 MG TOTAL) BY MOUTH ONCE DAILY., Disp: , Rfl: 6 .  ezetimibe (ZETIA) 10 MG tablet, Take 1 tablet (10 mg total) by mouth daily., Disp: 30 tablet, Rfl: 3 .  fluticasone (FLONASE) 50 MCG/ACT nasal spray, Place 2 sprays into both nostrils daily., Disp: 16 g,  Rfl: 6 .  KLOR-CON M20 20 MEQ tablet, TAKE 1 TABLET BY MOUTH ONCE A DAY, Disp: 90 tablet, Rfl: 1 .  loratadine (CLARITIN) 10 MG tablet, Take 10 mg by mouth daily as needed for allergies., Disp: , Rfl:  .  metoprolol tartrate (LOPRESSOR) 25 MG tablet, TAKE 1 TABLET (25 MG TOTAL) BY MOUTH 2 (TWO) TIMES DAILY., Disp: 60 tablet, Rfl: 3 .  SPIRIVA HANDIHALER 18 MCG inhalation capsule, INHALE 1 CASPULE VIA HANDIHALER ONCE A DAY AT THE SAME TIME EVERY DAY, Disp: 90 capsule, Rfl: 3 .  torsemide (DEMADEX) 20 MG tablet, TAKE 1 TABLET (20 MG TOTAL) BY MOUTH DAILY., Disp: 30 tablet, Rfl: 2 .  traZODone (DESYREL) 50 MG tablet, TAKE 1 TO 2 TABLETS BY MOUTH AT BEDTIME, Disp: 60 tablet, Rfl: 0 .  Vitamin D, Ergocalciferol, (DRISDOL) 50000 units CAPS capsule, TAKE 1 CAPSULE (50,000 UNITS TOTAL) BY MOUTH EVERY 7 (SEVEN) DAYS., Disp: 12 capsule, Rfl: 1 .  warfarin (COUMADIN) 5 MG tablet, Take 5-7.5 mg by mouth every evening. Pt takes one tablet Sunday, Monday, Wednesday, and Friday.  Pt takes one and one-half tablet on Tuesday, Thursday, and Saturday., Disp: , Rfl:   Allergies  Allergen Reactions  . Ace Inhibitors Cough  . Amoxicillin-Pot Clavulanate Nausea And Vomiting  . Codeine Nausea And Vomiting  . Penicillins Nausea And Vomiting and Other (See Comments)    Has patient had a PCN reaction causing immediate rash, facial/tongue/throat swelling, SOB or lightheadedness with hypotension: No Has patient had a PCN reaction causing severe rash involving mucus membranes or skin necrosis: No Has patient had a PCN reaction that required hospitalization No Has patient had a PCN reaction occurring within the last 10 years: No If all of the above answers are "NO", then may proceed with Cephalosporin use.  . Rosuvastatin Other (See Comments)    Reaction:  Joint pain   . Tetanus-Diphtheria Toxoids Td Swelling     ROS  Ten systems reviewed and is negative except as mentioned in HPI   Objective  Vitals:   10/15/16  1425  BP: 122/68  Pulse: 80  Resp: 16  Temp: 98.5 F (36.9 C)  SpO2: 98%  Weight: 178 lb 2 oz (80.8 kg)  Height: 5\' 4"  (1.626 m)    Body mass index is 30.58 kg/m.  Physical Exam  Constitutional: Patient appears well-developed and well-nourished. Obese No distress.  HEENT: head atraumatic, normocephalic, pupils equal and reactive to light,  neck supple, throat within normal limits Cardiovascular: Normal rate, regular rhythm and normal heart sounds.  No murmur heard. No BLE edema. Pulmonary/Chest: Effort normal and breath sounds normal. No respiratory distress. Abdominal: Soft.  There is no tenderness. Lipoma  on LUQ, mild discomfort on lower quadrants, increase in bowel sounds Psychiatric: Patient has a normal mood and affect. behavior is normal. Judgment and thought content normal.  Recent Results (from the past 2160 hour(s))  Lipid panel     Status: Abnormal   Collection Time: 08/12/16  2:17 PM  Result Value Ref Range   Cholesterol 253 (H) <200 mg/dL   Triglycerides 147 <150 mg/dL   HDL 44 (L) >50 mg/dL   Total CHOL/HDL Ratio 5.8 (H) <5.0 Ratio   VLDL 29 <30 mg/dL   LDL Cholesterol 180 (H) <100 mg/dL  COMPLETE METABOLIC PANEL WITH GFR     Status: Abnormal   Collection Time: 08/12/16  2:17 PM  Result Value Ref Range   Sodium 145 135 - 146 mmol/L   Potassium 4.0 3.5 - 5.3 mmol/L   Chloride 105 98 - 110 mmol/L   CO2 27 20 - 31 mmol/L   Glucose, Bld 100 (H) 65 - 99 mg/dL   BUN 16 7 - 25 mg/dL   Creat 0.80 0.60 - 0.93 mg/dL    Comment:   For patients > or = 74 years of age: The upper reference limit for Creatinine is approximately 13% higher for people identified as African-American.      Total Bilirubin 0.5 0.2 - 1.2 mg/dL   Alkaline Phosphatase 65 33 - 130 U/L   AST 27 10 - 35 U/L   ALT 31 (H) 6 - 29 U/L   Total Protein 6.5 6.1 - 8.1 g/dL   Albumin 3.8 3.6 - 5.1 g/dL   Calcium 9.2 8.6 - 10.4 mg/dL   GFR, Est African American 85 >=60 mL/min   GFR, Est Non African  American 73 >=60 mL/min  Hemoglobin A1c     Status: None   Collection Time: 08/12/16  2:17 PM  Result Value Ref Range   Hgb A1c MFr Bld 5.4 <5.7 %    Comment:   For the purpose of screening for the presence of diabetes:   <5.7%       Consistent with the absence of diabetes 5.7-6.4 %   Consistent with increased risk for diabetes (prediabetes) >=6.5 %     Consistent with diabetes   This assay result is consistent with a decreased risk of diabetes.   Currently, no consensus exists regarding use of hemoglobin A1c for diagnosis of diabetes in children.   According to American Diabetes Association (ADA) guidelines, hemoglobin A1c <7.0% represents optimal control in non-pregnant diabetic patients. Different metrics may apply to specific patient populations. Standards of Medical Care in Diabetes (ADA).      Mean Plasma Glucose 108 mg/dL  Uric acid     Status: Abnormal   Collection Time: 08/12/16  2:17 PM  Result Value Ref Range   Uric Acid, Serum 12.7 (H) 2.5 - 7.0 mg/dL      PHQ2/9: Depression screen [facility] 2/9 08/12/2016 04/15/2016 02/14/2016 01/14/2016 01/10/2016  Decreased Interest 0 0 0 0 0  Down, Depressed, Hopeless 0 0 0 0 0  PHQ - 2 Score 0 0 0 0 0  Altered sleeping - - - - -  Tired, decreased energy - - - - -  Change in appetite - - - - -  Feeling bad or failure about yourself  - - - - -  Trouble concentrating - - - - -  Moving slowly or fidgety/restless - - - - -  Suicidal thoughts - - - - -  PHQ-9 Score - - - - -  Difficult doing work/chores - - - - -     Fall Risk: Fall Risk  08/12/2016 04/15/2016 02/14/2016 01/14/2016 01/10/2016  Falls in the past year? No No No No No     Assessment & Plan  1. Gastroenteritis  - ondansetron (ZOFRAN) 4 MG tablet; Take 1 tablet (4 mg total) by mouth every 8 (eight) hours as needed for nausea or vomiting.  Dispense: 20 tablet; Refill: 0

## 2016-10-23 DIAGNOSIS — I480 Paroxysmal atrial fibrillation: Secondary | ICD-10-CM | POA: Diagnosis not present

## 2016-10-28 DIAGNOSIS — R011 Cardiac murmur, unspecified: Secondary | ICD-10-CM | POA: Diagnosis not present

## 2016-10-28 DIAGNOSIS — M1A00X0 Idiopathic chronic gout, unspecified site, without tophus (tophi): Secondary | ICD-10-CM | POA: Diagnosis not present

## 2016-10-28 DIAGNOSIS — R000 Tachycardia, unspecified: Secondary | ICD-10-CM | POA: Diagnosis not present

## 2016-10-28 DIAGNOSIS — D6832 Hemorrhagic disorder due to extrinsic circulating anticoagulants: Secondary | ICD-10-CM | POA: Diagnosis not present

## 2016-10-28 DIAGNOSIS — I480 Paroxysmal atrial fibrillation: Secondary | ICD-10-CM | POA: Diagnosis not present

## 2016-10-28 DIAGNOSIS — T45515A Adverse effect of anticoagulants, initial encounter: Secondary | ICD-10-CM | POA: Diagnosis not present

## 2016-10-28 DIAGNOSIS — R0602 Shortness of breath: Secondary | ICD-10-CM | POA: Diagnosis not present

## 2016-10-28 DIAGNOSIS — E669 Obesity, unspecified: Secondary | ICD-10-CM | POA: Diagnosis not present

## 2016-10-28 DIAGNOSIS — E784 Other hyperlipidemia: Secondary | ICD-10-CM | POA: Diagnosis not present

## 2016-12-02 DIAGNOSIS — I480 Paroxysmal atrial fibrillation: Secondary | ICD-10-CM | POA: Diagnosis not present

## 2016-12-03 DIAGNOSIS — Z952 Presence of prosthetic heart valve: Secondary | ICD-10-CM | POA: Diagnosis not present

## 2016-12-07 ENCOUNTER — Other Ambulatory Visit: Payer: Self-pay | Admitting: Family Medicine

## 2016-12-15 DIAGNOSIS — M109 Gout, unspecified: Secondary | ICD-10-CM | POA: Diagnosis not present

## 2016-12-22 ENCOUNTER — Ambulatory Visit (INDEPENDENT_AMBULATORY_CARE_PROVIDER_SITE_OTHER): Payer: Medicare Other | Admitting: Family Medicine

## 2016-12-22 VITALS — BP 122/78 | HR 78 | Temp 97.8°F | Resp 16 | Wt 174.5 lb

## 2016-12-22 DIAGNOSIS — E790 Hyperuricemia without signs of inflammatory arthritis and tophaceous disease: Secondary | ICD-10-CM

## 2016-12-22 DIAGNOSIS — M109 Gout, unspecified: Secondary | ICD-10-CM | POA: Diagnosis not present

## 2016-12-22 DIAGNOSIS — E785 Hyperlipidemia, unspecified: Secondary | ICD-10-CM

## 2016-12-22 NOTE — Patient Instructions (Addendum)
Cholesterol Cholesterol is a fat. Your body needs a small amount of cholesterol. Cholesterol (plaque) may build up in your blood vessels (arteries). That makes you more likely to have a heart attack or stroke. You cannot feel your cholesterol level. Having a blood test is the only way to find out if your level is high. Keep your test results. Work with your doctor to keep your cholesterol at a good level. What do the results mean?  Total cholesterol is how much cholesterol is in your blood.  LDL is bad cholesterol. This is the type that can build up. Try to have low LDL.  HDL is good cholesterol. It cleans your blood vessels and carries LDL away. Try to have high HDL.  Triglycerides are fat that the body can store or burn for energy. What are good levels of cholesterol?  Total cholesterol below 200.  LDL below 100 is good for people who have health risks. LDL below 70 is good for people who have very high risks.  HDL above 40 is good. It is best to have HDL of 60 or higher.  Triglycerides below 150. How can I lower my cholesterol? Diet  Follow your diet program as told by your doctor.  Choose fish, white meat chicken, or [place] that is roasted or baked. Try not to eat red meat, fried foods, sausage, or lunch meats.  Eat lots of fresh fruits and vegetables.  Choose whole grains, beans, pasta, potatoes, and cereals.  Choose olive oil, corn oil, or canola oil. Only use small amounts.  Try not to eat butter, mayonnaise, shortening, or palm kernel oils.  Try not to eat foods with trans fats.  Choose low-fat or nonfat dairy foods.  Drink skim or nonfat milk.  Eat low-fat or nonfat yogurt and cheeses.  Try not to drink whole milk or cream.  Try not to eat ice cream, egg yolks, or full-fat cheeses.  Healthy desserts include angel food cake, ginger snaps, animal crackers, hard candy, popsicles, and low-fat or nonfat frozen yogurt. Try not to eat pastries, cakes, pies, and  cookies. Exercise  Follow your exercise program as told by your doctor.  Be more active. Try gardening, walking, and taking the stairs.  Ask your doctor about ways that you can be more active. Medicine  Take over-the-counter and prescription medicines only as told by your doctor. This information is not intended to replace advice given to you by your health care provider. Make sure you discuss any questions you have with your health care provider. Document Released: 11/21/2008 Document Revised: 03/26/2016 Document Reviewed: 03/06/2016 Elsevier Interactive Patient Education  2017 [employer].  Gout Gout is painful swelling that can happen in some of your joints. Gout is a type of arthritis. This condition is caused by having too much uric acid in your body. Uric acid is a chemical that is made when your body breaks down substances called purines. If your body has too much uric acid, sharp crystals can form and build up in your joints. This causes pain and swelling. Gout attacks can happen quickly and be very painful (acute gout). Over time, the attacks can affect more joints and happen more often (chronic gout). Follow these instructions at home: During a Gout Attack   If directed, put ice on the painful area:  Put ice in a plastic bag.  Place a towel between your skin and the bag.  Leave the ice on for 20 minutes, 2-3 times a day.  Rest the  joint as much as possible. If the joint is in your leg, you may be given crutches to use.  Raise (elevate) the painful joint above the level of your heart as often as you can.  Drink enough fluids to keep your pee (urine) clear or pale yellow.  Take over-the-counter and prescription medicines only as told by your doctor.  Do not drive or use heavy machinery while taking prescription pain medicine.  Follow instructions from your doctor about what you can or cannot eat and drink.  Return to your normal activities as told by your doctor. Ask  your doctor what activities are safe for you. Avoiding Future Gout Attacks   Follow a low-purine diet as told by a specialist (dietitian) or your doctor. Avoid foods and drinks that have a lot of purines, such as:  Liver.  Kidney.  Anchovies.  Asparagus.  Herring.  Mushrooms  Mussels.  Beer.  Limit alcohol intake to no more than 1 drink a day for nonpregnant women and 2 drinks a day for men. One drink equals 12 oz of beer, 5 oz of wine, or 1 oz of hard liquor.  Stay at a healthy weight or lose weight if you are overweight. If you want to lose weight, talk with your doctor. It is important that you do not lose weight too fast.  Start or continue an exercise plan as told by your doctor.  Drink enough fluids to keep your pee clear or pale yellow.  Take over-the-counter and prescription medicines only as told by your doctor.  Keep all follow-up visits as told by your doctor. This is important. Contact a doctor if:  You have another gout attack.  You still have symptoms of a gout attack after10 days of treatment.  You have problems (side effects) because of your medicines.  You have chills or a fever.  You have burning pain when you pee (urinate).  You have pain in your lower back or belly. Get help right away if:  You have very bad pain.  Your pain cannot be controlled.  You cannot pee. This information is not intended to replace advice given to you by your health care provider. Make sure you discuss any questions you have with your health care provider. Document Released: 06/03/2008 Document Revised: 01/31/2016 Document Reviewed: 06/07/2015 Elsevier Interactive Patient Education  2017 [employer].

## 2016-12-22 NOTE — Progress Notes (Signed)
Name: [name]   MRN: [id number]    DOB: [birth year]   Date:12/22/2016       Progress Note  Subjective  Chief Complaint  Chief Complaint  Patient presents with  . Gout    flare up in her left knee    HPI  Gout/Hyperuricemia: Pt presents to follow up on Gout flare to the left knee for which she was seen 7 days ago at the [place] walk-in clinic.  She reports great improvement with only mild soreness and stiffness. Her Uric Acid Level on Monday 12/15/2016 was 8.7.  She had not been taking her Allopurinol as prescribed prior to the flare, but has been since. She used Colchicine and Tramadol to treat the flare and took off of work last week to rest.  Dyslipidemia: Pt states has not been taking her Zetia because it was too expensive. Discussed importance of taking this medication daily as she is unable to tolerate Statin therapy.  She states she believes she will  Be able to afford this going forward and will refill the medication as soon as possible.   Patient Active Problem List   Diagnosis Date Noted  . Chronic systolic heart failure ([place]) 10/08/2015  . Chronic obstructive pulmonary disease ([place]) 07/04/2015  . Controlled gout 06/06/2015  . Anxiety 02/09/2015  . Synovial cyst of popliteal space 02/09/2015  . Benign hypertension 02/09/2015  . Blood type A+ 02/09/2015  . Arterial branch occlusion of retina 02/09/2015  . Chronic constipation 02/09/2015  . Insomnia, persistent 02/09/2015  . Chronic combined systolic and diastolic CHF, NYHA class 1 ([place]) 02/09/2015  . Compromised kidney function 02/09/2015  . Dyslipidemia 02/09/2015  . Atrial fibrillation ([place]) 02/09/2015  . Arthritis urica 02/09/2015  . Hearing loss 02/09/2015  . History of open heart surgery 02/09/2015  . Chronic hoarseness 02/09/2015  . Cardiomegaly 02/09/2015  . Dysmetabolic syndrome 55/73/2202  . Migraine with aura and without status migrainosus, not intractable 02/09/2015  . Adult BMI 30+ 02/09/2015  .  Hypertensive pulmonary vascular disease ([place]) 02/09/2015  . Allergic rhinitis, seasonal 02/09/2015  . Vitamin D deficiency 02/09/2015  . History of mitral valve repair 10/01/2012  . History of aortic valve replacement 10/01/2012  . Congestive heart failure ([place]) 10/01/2012    Social History  Substance Use Topics  . Smoking status: Former Smoker    Quit date: 09/09/2007  . Smokeless tobacco: Never Used  . Alcohol use No    Current Outpatient Prescriptions:  .  acetaminophen (TYLENOL) 500 MG tablet, Take 1,000-1,500 mg by mouth every 6 (six) hours as needed for mild pain., Disp: , Rfl:  .  allopurinol (ZYLOPRIM) 300 MG tablet, TAKE 1 TABLET BY MOUTH DAILY, Disp: 90 tablet, Rfl: 1 .  aspirin EC 81 MG tablet, Take 81 mg by mouth every evening., Disp: , Rfl:  .  COLCRYS 0.6 MG tablet, TAKE 2 TABS BY MOUTH AT ONSET OF PAIN. REPEAT WITH 1 TAB IN 1 HOUR. *MAX 3TABS/24 HOURS*, Disp: 30 tablet, Rfl: 1 .  diazepam (VALIUM) 5 MG tablet, TAKE 1 TABLET BY MOUTH AS NEEDED, Disp: 30 tablet, Rfl: 0 .  digoxin (LANOXIN) 0.125 MG tablet, TAKE 1 TABLET (0.125 MG TOTAL) BY MOUTH ONCE DAILY., Disp: , Rfl: 6 .  ezetimibe (ZETIA) 10 MG tablet, Take 1 tablet (10 mg total) by mouth daily., Disp: 30 tablet, Rfl: 3 .  fluticasone (FLONASE) 50 MCG/ACT nasal spray, Place 2 sprays into both nostrils daily., Disp: 16 g, Rfl: 6 .  KLOR-CON M20  20 MEQ tablet, TAKE 1 TABLET BY MOUTH ONCE A DAY, Disp: 90 tablet, Rfl: 1 .  loratadine (CLARITIN) 10 MG tablet, Take 10 mg by mouth daily as needed for allergies., Disp: , Rfl:  .  metoprolol tartrate (LOPRESSOR) 25 MG tablet, TAKE 1 TABLET (25 MG TOTAL) BY MOUTH 2 (TWO) TIMES DAILY., Disp: 60 tablet, Rfl: 3 .  ondansetron (ZOFRAN) 4 MG tablet, Take 1 tablet (4 mg total) by mouth every 8 (eight) hours as needed for nausea or vomiting., Disp: 20 tablet, Rfl: 0 .  SPIRIVA HANDIHALER 18 MCG inhalation capsule, INHALE 1 CASPULE VIA HANDIHALER ONCE A DAY AT THE SAME TIME EVERY DAY,  Disp: 90 capsule, Rfl: 3 .  torsemide (DEMADEX) 20 MG tablet, TAKE 1 TABLET (20 MG TOTAL) BY MOUTH DAILY., Disp: 30 tablet, Rfl: 0 .  traZODone (DESYREL) 50 MG tablet, TAKE 1 TO 2 TABLETS BY MOUTH AT BEDTIME, Disp: 60 tablet, Rfl: 0 .  Vitamin D, Ergocalciferol, (DRISDOL) 50000 units CAPS capsule, TAKE 1 CAPSULE (50,000 UNITS TOTAL) BY MOUTH EVERY 7 (SEVEN) DAYS., Disp: 12 capsule, Rfl: 1 .  warfarin (COUMADIN) 5 MG tablet, Take 5-7.5 mg by mouth every evening. Pt takes one tablet Sunday, Monday, Wednesday, and Friday.  Pt takes one and one-half tablet on Tuesday, Thursday, and Saturday., Disp: , Rfl:   Allergies  Allergen Reactions  . Ace Inhibitors Cough and Other (See Comments)  . Amoxicillin-Pot Clavulanate Nausea And Vomiting  . Codeine Nausea And Vomiting  . Penicillins Nausea And Vomiting and Other (See Comments)    Has patient had a PCN reaction causing immediate rash, facial/tongue/throat swelling, SOB or lightheadedness with hypotension: No Has patient had a PCN reaction causing severe rash involving mucus membranes or skin necrosis: No Has patient had a PCN reaction that required hospitalization No Has patient had a PCN reaction occurring within the last 10 years: No If all of the above answers are "NO", then may proceed with Cephalosporin use.  . Rosuvastatin Other (See Comments)    Reaction:  Joint pain   . Tetanus-Diphtheria Toxoids Td Swelling    ROS  Constitutional: Negative for fever or weight change.  Respiratory: Negative for cough and shortness of breath.   Cardiovascular: Negative for chest pain or palpitations.  Gastrointestinal: Negative for abdominal pain, no bowel changes.  Musculoskeletal: Negative for gait problem or joint swelling.  Skin: Negative for rash. Positive for mild swelling to left knee. Neurological: Negative for dizziness or headache.  No other specific complaints in a complete review of systems (except as listed in HPI  above).  Objective  Vitals:   12/22/16 1335  BP: 122/78  Pulse: 78  Resp: 16  Temp: 97.8 F (36.6 C)  SpO2: 97%  Weight: 174 lb 8 oz (79.2 kg)    Body mass index is 29.95 kg/m.  Nursing Note and Vital Signs reviewed.  Physical Exam  Constitutional: Patient appears well-developed and well-nourished. Obese, but is congratulated on 4lb weight loss since last visit. No distress.  HEENT: head atraumatic, normocephalic, neck supple without lymphadenopathy Cardiovascular: Normal rate, regular rhythm, S1/S2 present.  Murmur is present, has hx of the same; No rub heard. No BLE edema. Pulmonary/Chest: Effort normal and breath sounds clear. No respiratory distress or retractions. MSK: Mild swelling to left knee, no erythema, no heat or pain on palpation; no crepitus bilaterally, normal ROM in BLE, no gait disturbance. Psychiatric: Patient has a normal mood and affect. behavior is normal. Judgment and thought content normal.  No  results found for this or any previous visit (from the past 2160 hour(s)).   Assessment & Plan  1. Acute gout of left knee, unspecified cause -AVS supplied with basic gout care and dietary modifications. These were discussed with the patient at length. -Counseled on need for consistent use of Allopurinol, pt is agreeable to continue taking daily to avoid future flares.  She does not need a refill at this time.  2. Hyperuricemia -We will recheck Uric Acid at next follow up. Return in about 8 weeks (around 02/16/2017). 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  3. Dyslipidemia -Pt will refill Zetia as soon as possible and begin taking daily.  Red flags and when to present for emergency care reviewed with patient at time of visit. Follow up and care instructions discussed and provided in AVS.

## 2017-01-06 ENCOUNTER — Other Ambulatory Visit: Payer: Self-pay | Admitting: Family Medicine

## 2017-01-06 NOTE — Telephone Encounter (Signed)
Patient requesting refill of Torsemide to [facility].

## 2017-01-28 ENCOUNTER — Encounter: Payer: Self-pay | Admitting: Family Medicine

## 2017-01-28 ENCOUNTER — Ambulatory Visit
Admission: RE | Admit: 2017-01-28 | Discharge: 2017-01-28 | Disposition: A | Discharge status: Home / Self Care | Payer: Medicare Other | Source: Ambulatory Visit | Attending: Family Medicine | Admitting: Family Medicine

## 2017-01-28 ENCOUNTER — Ambulatory Visit (INDEPENDENT_AMBULATORY_CARE_PROVIDER_SITE_OTHER): Payer: Medicare Other | Admitting: Family Medicine

## 2017-01-28 VITALS — BP 122/68 | HR 76 | Temp 97.9°F | Resp 16 | Ht 64.0 in | Wt 176.2 lb

## 2017-01-28 DIAGNOSIS — G43109 Migraine with aura, not intractable, without status migrainosus: Secondary | ICD-10-CM

## 2017-01-28 DIAGNOSIS — I5042 Chronic combined systolic (congestive) and diastolic (congestive) heart failure: Secondary | ICD-10-CM

## 2017-01-28 DIAGNOSIS — M109 Gout, unspecified: Secondary | ICD-10-CM

## 2017-01-28 DIAGNOSIS — M79675 Pain in left toe(s): Secondary | ICD-10-CM | POA: Diagnosis not present

## 2017-01-28 DIAGNOSIS — F419 Anxiety disorder, unspecified: Secondary | ICD-10-CM

## 2017-01-28 DIAGNOSIS — Z952 Presence of prosthetic heart valve: Secondary | ICD-10-CM | POA: Diagnosis not present

## 2017-01-28 DIAGNOSIS — I10 Essential (primary) hypertension: Secondary | ICD-10-CM | POA: Diagnosis not present

## 2017-01-28 DIAGNOSIS — S99922A Unspecified injury of left foot, initial encounter: Secondary | ICD-10-CM | POA: Diagnosis not present

## 2017-01-28 DIAGNOSIS — G4709 Other insomnia: Secondary | ICD-10-CM | POA: Diagnosis not present

## 2017-01-28 DIAGNOSIS — R739 Hyperglycemia, unspecified: Secondary | ICD-10-CM | POA: Diagnosis not present

## 2017-01-28 DIAGNOSIS — M79672 Pain in left foot: Secondary | ICD-10-CM | POA: Insufficient documentation

## 2017-01-28 DIAGNOSIS — E8881 Metabolic syndrome: Secondary | ICD-10-CM | POA: Diagnosis not present

## 2017-01-28 DIAGNOSIS — Z9181 History of falling: Secondary | ICD-10-CM | POA: Diagnosis not present

## 2017-01-28 DIAGNOSIS — E785 Hyperlipidemia, unspecified: Secondary | ICD-10-CM | POA: Diagnosis not present

## 2017-01-28 DIAGNOSIS — M19072 Primary osteoarthritis, left ankle and foot: Secondary | ICD-10-CM | POA: Diagnosis not present

## 2017-01-28 DIAGNOSIS — I480 Paroxysmal atrial fibrillation: Secondary | ICD-10-CM | POA: Diagnosis not present

## 2017-01-28 MED ORDER — ALLOPURINOL 300 MG PO TABS: 300.0000 mg | ORAL_TABLET | Freq: Every day | ORAL | 1 refills | Status: DC

## 2017-01-28 MED ORDER — EZETIMIBE 10 MG PO TABS: 10.0000 mg | ORAL_TABLET | Freq: Every day | ORAL | 1 refills | Status: DC

## 2017-01-28 MED ORDER — TEMAZEPAM 15 MG PO CAPS: 15.0000 mg | ORAL_CAPSULE | Freq: Every evening | ORAL | 2 refills | Status: DC | PRN

## 2017-01-28 MED ORDER — METOPROLOL TARTRATE 25 MG PO TABS: 25.0000 mg | ORAL_TABLET | Freq: Two times a day (BID) | ORAL | 1 refills | Status: DC

## 2017-01-28 NOTE — Progress Notes (Signed)
Name: [name]   MRN: [id number]    DOB: [birth year]   Date:01/28/2017       Progress Note  Subjective  Chief Complaint  Chief Complaint  Patient presents with  . Fall    had a fall 2 weeks ago and has had pain in her left big toe which is getting worse pt thinks it may be gout also                                                    HPI  Migraine headaches: she has occasional episodes of aura, described as scotomas and recently not followed by a headache. Usually triggered by not sleeping well or skipping a meal, but improves with rest or when she eats food. She is doing well at this time.   Chronic combined CHF: she is on Demadex and potassium, digoxin and beta-blocker . No chest or SOB, she denies orthopnea. She has been compliant with medication. She is not on ACE because of cough, she was on Losartan for a period of time but bp dropped so cardiologist, Dr. Clayborn Bigness stopped medication. She is feeling well, try to participate on a CHF study but EF 40% and she does not symptoms of CHF therefore was declined to participate.   HTN: taking metoprolol, bp much better controlled since aortic valve replacement. No chest pain , no current SOB  Hyperlipidemia:she was taking Lipitor, but it was causing muscle aches and she stopped taking medication in 2017, she is on Zetia now  Insomnia: sleeping well, she states in the beginning she was tolerating Trazodone, however states that now it makes her feel groggy in am's. She would like to try something else. We will try Temzepam since she also has anxiety and she feels anxious before bed time.    Gout: she is having symptoms of gout again, she states she has been taking Allopurinol.   Dysmetabolic Syndrome: last IDPO2U 5.8%, she is not very compliant with diet. She has been physically active, walking daily . Denies polyphagia, polydipsia or polyuria  Afib: rate controlled, no fluttering sensation, taking betablocker and digoxin, also on  coumadin and goes to coumadin clinic/Dr. Callwood   COPD : she stopped taking Spiriva because she is feeling well. She states no longer has a productive cough in am's   Recent fall: she states she got out of her car and fell on the curb, scrapped her right lower leg and developed left first toe pain, initially she did no know if it was secondary to gout. She states not red, but has swelling, mild pain  Depression: she has been waking up feeling sad, upset about the way her body looks, she is not sure about her future, she feels anxious at night. She likes to work and signed up for the reading [employer] Discussed SSRI, she refuses it at this time  Patient Active Problem List   Diagnosis Date Noted  . Chronic systolic heart failure ([place]) 10/08/2015  . Chronic obstructive pulmonary disease ([place]) 07/04/2015  . Controlled gout 06/06/2015  . Anxiety 02/09/2015  . Synovial cyst of popliteal space 02/09/2015  . Benign hypertension 02/09/2015  . Blood type A+ 02/09/2015  . Arterial branch occlusion of retina 02/09/2015  . Chronic constipation 02/09/2015  . Insomnia, persistent 02/09/2015  . Chronic combined systolic and diastolic CHF,  NYHA class 1 ([place]) 02/09/2015  . Compromised kidney function 02/09/2015  . Dyslipidemia 02/09/2015  . Atrial fibrillation ([employer]) 02/09/2015  . Arthritis urica 02/09/2015  . Hearing loss 02/09/2015  . History of open heart surgery 02/09/2015  . Chronic hoarseness 02/09/2015  . Cardiomegaly 02/09/2015  . Dysmetabolic syndrome 33/82/5053  . Migraine with aura and without status migrainosus, not intractable 02/09/2015  . Adult BMI 30+ 02/09/2015  . Hypertensive pulmonary vascular disease ([place]) 02/09/2015  . Allergic rhinitis, seasonal 02/09/2015  . Vitamin D deficiency 02/09/2015  . History of mitral valve repair 10/01/2012  . History of aortic valve replacement 10/01/2012  . Congestive heart failure ([place]) 10/01/2012    Past Surgical History:   Procedure Laterality Date  . ABDOMINAL HYSTERECTOMY  1996  . BREAST BIOPSY Right 1998   neg  . BREAST BIOPSY Right 2007   neg  . BREAST BIOPSY Right 1992   positive  . BREAST EXCISIONAL BIOPSY Left 2000   neg  . CARDIAC VALVE REPLACEMENT  2014    Family History  Problem Relation Age of Onset  . Cancer Mother   . Diabetes Mother   . Hypertension Mother   . Breast cancer Mother 35  . Hypertension Father   . Heart failure Father   . Cancer Brother        bladder  . Cancer Sister        breast  . Breast cancer Sister 70  . Hypertension Son   . Hypertension Daughter   . Breast cancer Sister 49    Social History   Social History  . Marital status: Widowed    Spouse name: N/A  . Number of children: 2  . Years of education: N/A   Occupational History  . Custodian    Social History Main Topics  . Smoking status: Former Smoker    Quit date: 09/09/2007  . Smokeless tobacco: Never Used  . Alcohol use No  . Drug use: No  . Sexual activity: Not Currently   Other Topics Concern  . Not on file   Social History Narrative  . No narrative on file     Current Outpatient Prescriptions:  .  acetaminophen (TYLENOL) 500 MG tablet, Take 1,000-1,500 mg by mouth every 6 (six) hours as needed for mild pain., Disp: , Rfl:  .  allopurinol (ZYLOPRIM) 300 MG tablet, Take 1 tablet (300 mg total) by mouth daily., Disp: 90 tablet, Rfl: 1 .  aspirin EC 81 MG tablet, Take 81 mg by mouth every evening., Disp: , Rfl:  .  COLCRYS 0.6 MG tablet, TAKE 2 TABS BY MOUTH AT ONSET OF PAIN. REPEAT WITH 1 TAB IN 1 HOUR. *MAX 3TABS/24 HOURS*, Disp: 30 tablet, Rfl: 1 .  digoxin (LANOXIN) 0.125 MG tablet, TAKE 1 TABLET (0.125 MG TOTAL) BY MOUTH ONCE DAILY., Disp: , Rfl: 6 .  ezetimibe (ZETIA) 10 MG tablet, Take 1 tablet (10 mg total) by mouth daily., Disp: 90 tablet, Rfl: 1 .  fluticasone (FLONASE) 50 MCG/ACT nasal spray, Place 2 sprays into both nostrils daily., Disp: 16 g, Rfl: 6 .  KLOR-CON M20 20  MEQ tablet, TAKE 1 TABLET BY MOUTH ONCE A DAY, Disp: 90 tablet, Rfl: 1 .  loratadine (CLARITIN) 10 MG tablet, Take 10 mg by mouth daily as needed for allergies., Disp: , Rfl:  .  metoprolol tartrate (LOPRESSOR) 25 MG tablet, Take 1 tablet (25 mg total) by mouth 2 (two) times daily., Disp: 180 tablet, Rfl: 1 .  SPIRIVA HANDIHALER  18 MCG inhalation capsule, INHALE 1 CASPULE VIA HANDIHALER ONCE A DAY AT THE SAME TIME EVERY DAY, Disp: 90 capsule, Rfl: 3 .  temazepam (RESTORIL) 15 MG capsule, Take 1 capsule (15 mg total) by mouth at bedtime as needed for sleep., Disp: 30 capsule, Rfl: 2 .  torsemide (DEMADEX) 20 MG tablet, TAKE 1 TABLET (20 MG TOTAL) BY MOUTH DAILY., Disp: 30 tablet, Rfl: 0 .  warfarin (COUMADIN) 5 MG tablet, Take 5-7.5 mg by mouth every evening. Pt takes one tablet Sunday, Monday, Wednesday, and Friday.  Pt takes one and one-half tablet on Tuesday, Thursday, and Saturday., Disp: , Rfl:   Allergies  Allergen Reactions  . Ace Inhibitors Cough and Other (See Comments)  . Amoxicillin-Pot Clavulanate Nausea And Vomiting  . Codeine Nausea And Vomiting  . Penicillins Nausea And Vomiting and Other (See Comments)    Has patient had a PCN reaction causing immediate rash, facial/tongue/throat swelling, SOB or lightheadedness with hypotension: No Has patient had a PCN reaction causing severe rash involving mucus membranes or skin necrosis: No Has patient had a PCN reaction that required hospitalization No Has patient had a PCN reaction occurring within the last 10 years: No If all of the above answers are "NO", then may proceed with Cephalosporin use.  . Rosuvastatin Other (See Comments)    Reaction:  Joint pain   . Tetanus-Diphtheria Toxoids Td Swelling     ROS  Constitutional: Negative for fever or weight change.  Respiratory: Negative for cough and shortness of breath.   Cardiovascular: Negative for chest pain or palpitations.  Gastrointestinal: Negative for abdominal pain, no bowel  changes.  Musculoskeletal: Negative for gait problem, positive for  joint swelling.  Skin: Negative for rash.  Neurological: Negative for dizziness or headache.  No other specific complaints in a complete review of systems (except as listed in HPI above).  Objective  Vitals:   01/28/17 1435  BP: 122/68  Pulse: 76  Resp: 16  Temp: 97.9 F (36.6 C)  SpO2: 97%  Weight: 176 lb 3 oz (79.9 kg)  Height: 5\' 4"  (1.626 m)    Body mass index is 30.24 kg/m.  Physical Exam  Constitutional: Patient appears well-developed Obese No distress.  HEENT: head atraumatic, normocephalic, pupils equal and reactive to light, right TM shows scar tissue, no nystagmus, neck supple, throat within normal limits Cardiovascular: Irregular Rhythm , systolic ejection murmur 3/6 No BLE edema. Pulmonary/Chest: Effort normal and breath sounds normal. No respiratory distress. Abdominal: Soft. There is no tenderness. Psychiatric: Patient has a normal mood and affect. behavior is normal. Judgment and thought content normal. Neurological : Awake, alert and oriented. No focal findings Muscular Skeletal: left fit toe is swollen but not red, pain during movement of left toe, and with compression of PID joint.    PHQ2/9: Depression screen [facility] 2/9 08/12/2016 04/15/2016 02/14/2016 01/14/2016 01/10/2016  Decreased Interest 0 0 0 0 0  Down, Depressed, Hopeless 0 0 0 0 0  PHQ - 2 Score 0 0 0 0 0  Altered sleeping - - - - -  Tired, decreased energy - - - - -  Change in appetite - - - - -  Feeling bad or failure about yourself  - - - - -  Trouble concentrating - - - - -  Moving slowly or fidgety/restless - - - - -  Suicidal thoughts - - - - -  PHQ-9 Score - - - - -  Difficult doing work/chores - - - - -  Fall Risk: Fall Risk  08/12/2016 04/15/2016 02/14/2016 01/14/2016 01/10/2016  Falls in the past year? No No No No No     Assessment & Plan  1. History of recent fall  - DG Toe Great Left; Future Discussed PT and she  is wiling to go   2. Left foot pain  - DG Toe Great Left; Future  3. Dyslipidemia  - ezetimibe (ZETIA) 10 MG tablet; Take 1 tablet (10 mg total) by mouth daily.  Dispense: 90 tablet; Refill: 1  4. Migraine with aura and without status migrainosus, not intractable   5. Hyperglycemia   6. Dysmetabolic syndrome  Discussed life style modification   7. Chronic combined systolic and diastolic CHF, NYHA class 1 (HCC)  Last EF 40 % per patient, done for a research study   8. Benign hypertension  Well controlled  9. Paroxysmal atrial fibrillation (HCC)  - metoprolol tartrate (LOPRESSOR) 25 MG tablet; Take 1 tablet (25 mg total) by mouth 2 (two) times daily.  Dispense: 180 tablet; Refill: 1  10. History of aortic valve replacement  On coumadin, getting it checked by Dr. Clayborn Bigness   11. Other insomnia  - temazepam (RESTORIL) 15 MG capsule; Take 1 capsule (15 mg total) by mouth at bedtime as needed for sleep.  Dispense: 30 capsule; Refill: 2  12. Anxiety  - temazepam (RESTORIL) 15 MG capsule; Take 1 capsule (15 mg total) by mouth at bedtime as needed for sleep.  Dispense: 30 capsule; Refill: 2  13. Controlled gout  - allopurinol (ZYLOPRIM) 300 MG tablet; Take 1 tablet (300 mg total) by mouth daily.  Dispense: 90 tablet; Refill: 1

## 2017-01-29 ENCOUNTER — Telehealth: Payer: Self-pay | Admitting: Family Medicine

## 2017-01-29 NOTE — Telephone Encounter (Signed)
Pt would like X Ray results.

## 2017-01-30 ENCOUNTER — Other Ambulatory Visit: Payer: Self-pay | Admitting: Family Medicine

## 2017-01-30 DIAGNOSIS — N951 Menopausal and female climacteric states: Secondary | ICD-10-CM

## 2017-02-03 NOTE — Telephone Encounter (Signed)
Left pt a voicemail with results and to call back if she has any questions

## 2017-02-06 ENCOUNTER — Other Ambulatory Visit: Payer: Self-pay | Admitting: Family Medicine

## 2017-02-06 DIAGNOSIS — I10 Essential (primary) hypertension: Secondary | ICD-10-CM

## 2017-02-06 DIAGNOSIS — I5022 Chronic systolic (congestive) heart failure: Secondary | ICD-10-CM

## 2017-02-16 ENCOUNTER — Ambulatory Visit: Payer: Medicare Other | Admitting: Family Medicine

## 2017-03-08 ENCOUNTER — Other Ambulatory Visit: Payer: Self-pay | Admitting: Family Medicine

## 2017-03-08 DIAGNOSIS — I10 Essential (primary) hypertension: Secondary | ICD-10-CM

## 2017-03-08 DIAGNOSIS — I5022 Chronic systolic (congestive) heart failure: Secondary | ICD-10-CM

## 2017-03-10 DIAGNOSIS — I480 Paroxysmal atrial fibrillation: Secondary | ICD-10-CM | POA: Diagnosis not present

## 2017-04-14 ENCOUNTER — Ambulatory Visit (INDEPENDENT_AMBULATORY_CARE_PROVIDER_SITE_OTHER): Payer: Medicare Other | Admitting: Family Medicine

## 2017-04-14 ENCOUNTER — Encounter: Payer: Self-pay | Admitting: Family Medicine

## 2017-04-14 VITALS — BP 132/82 | HR 80 | Temp 97.4°F | Resp 18 | Ht 64.0 in | Wt 171.5 lb

## 2017-04-14 DIAGNOSIS — I480 Paroxysmal atrial fibrillation: Secondary | ICD-10-CM

## 2017-04-14 DIAGNOSIS — I10 Essential (primary) hypertension: Secondary | ICD-10-CM | POA: Diagnosis not present

## 2017-04-14 DIAGNOSIS — E559 Vitamin D deficiency, unspecified: Secondary | ICD-10-CM

## 2017-04-14 DIAGNOSIS — R739 Hyperglycemia, unspecified: Secondary | ICD-10-CM

## 2017-04-14 DIAGNOSIS — G4709 Other insomnia: Secondary | ICD-10-CM | POA: Diagnosis not present

## 2017-04-14 DIAGNOSIS — M1000 Idiopathic gout, unspecified site: Secondary | ICD-10-CM | POA: Diagnosis not present

## 2017-04-14 DIAGNOSIS — J410 Simple chronic bronchitis: Secondary | ICD-10-CM

## 2017-04-14 DIAGNOSIS — G43109 Migraine with aura, not intractable, without status migrainosus: Secondary | ICD-10-CM

## 2017-04-14 DIAGNOSIS — F419 Anxiety disorder, unspecified: Secondary | ICD-10-CM | POA: Diagnosis not present

## 2017-04-14 DIAGNOSIS — I5042 Chronic combined systolic (congestive) and diastolic (congestive) heart failure: Secondary | ICD-10-CM

## 2017-04-14 DIAGNOSIS — E8881 Metabolic syndrome: Secondary | ICD-10-CM | POA: Diagnosis not present

## 2017-04-14 DIAGNOSIS — E785 Hyperlipidemia, unspecified: Secondary | ICD-10-CM

## 2017-04-14 MED ORDER — TEMAZEPAM 15 MG PO CAPS: 15.0000 mg | ORAL_CAPSULE | Freq: Every evening | ORAL | 2 refills | Status: DC | PRN

## 2017-04-14 MED ORDER — TORSEMIDE 20 MG PO TABS: 20.0000 mg | ORAL_TABLET | Freq: Every day | ORAL | 1 refills | Status: DC

## 2017-04-14 NOTE — Addendum Note (Signed)
Addended by: [name] F on: 04/14/2017 11:11 AM   Modules accepted: Orders

## 2017-04-14 NOTE — Progress Notes (Signed)
Name: [name]   MRN: [id number]    DOB: May 16, 1941   Date:04/14/2017       Progress Note  Subjective  Chief Complaint  Chief Complaint  Patient presents with  . Medication Refill  . Gout    HPI   Chronic combined CHF: she is on Demadex and potassium, digoxin and beta-blocker . No chest , she denies SOB at this times, she denies orthopnea. She has been compliant with medication. She is not on ACE because of cough, she was on Losartan for a period of time but bp dropped so cardiologist, Dr. Clayborn Bigness stopped medication.    HTN: taking metoprolol, bp much better controlled since aortic valve replacement. No chest pain , she denies SOB with activity.   Hyperlipidemia:she was taking Lipitor, but it was causing muscle aches and she stopped taking medication in 2017, she is never filled rx of Zetia because of cost, but states she will try to get it filled soon.   Insomnia: sleeping well, she states in the beginning she was tolerating Trazodone, however states that now it makes her feel groggy in am's. She is now on Temazepam and is feeling well, takes prn. No side effects of medication  Gout: she is having symptoms of gout again, she states she has not been taking Allopurinol, she resume medication daily.   Dysmetabolic Syndrome: she is not very compliant with diet. She has been physically active, walking daily . Denies polyphagia, polydipsia or polyuria  Afib: rate controlled, no fluttering sensation, taking betablocker and digoxin, also on coumadin and goes to coumadin clinic/Dr. Callwood  Chronic bronchitis : she stopped taking Spiriva because she is feeling well. She states very seldom has a productive cough in the mornings , no wheezing, or SOB  Depression: she has been waking up feeling sad, upset about the way her body looks, she is not sure about her future, she feels anxious at night. She likes to work and signed up for the reading [employer] Discussed SSRI,  she refuses it at this time  Migraine headaches: she has occasional episodes of aura, described as scotomas and recently not followed by a headache. Usually triggered by not sleeping well or skipping a meal, but improves with rest or when she eats food.    Patient Active Problem List   Diagnosis Date Noted  . Chronic systolic heart failure ([place]) 10/08/2015  . Chronic obstructive pulmonary disease ([place]) 07/04/2015  . Controlled gout 06/06/2015  . Anxiety 02/09/2015  . Synovial cyst of popliteal space 02/09/2015  . Benign hypertension 02/09/2015  . Blood type A+ 02/09/2015  . Arterial branch occlusion of retina 02/09/2015  . Chronic constipation 02/09/2015  . Insomnia, persistent 02/09/2015  . Chronic combined systolic and diastolic CHF, NYHA class 1 ([place]) 02/09/2015  . Compromised kidney function 02/09/2015  . Dyslipidemia 02/09/2015  . Atrial fibrillation ([place]) 02/09/2015  . Arthritis urica 02/09/2015  . Hearing loss 02/09/2015  . History of open heart surgery 02/09/2015  . Chronic hoarseness 02/09/2015  . Cardiomegaly 02/09/2015  . Dysmetabolic syndrome 73/41/9379  . Migraine with aura and without status migrainosus, not intractable 02/09/2015  . Adult BMI 30+ 02/09/2015  . Hypertensive pulmonary vascular disease ([place]) 02/09/2015  . Allergic rhinitis, seasonal 02/09/2015  . Vitamin D deficiency 02/09/2015  . History of mitral valve repair 10/01/2012  . History of aortic valve replacement 10/01/2012  . Congestive heart failure ([place]) 10/01/2012    Past Surgical History:  Procedure Laterality Date  . ABDOMINAL HYSTERECTOMY  [place]   neg  . BREAST BIOPSY Right 2007   neg  . BREAST BIOPSY Right 1992   positive  . BREAST EXCISIONAL BIOPSY Left 2000   neg  . CARDIAC VALVE REPLACEMENT  2014    Family History  Problem Relation Age of Onset  . Cancer Mother   . Diabetes Mother   . Hypertension Mother   . Breast cancer Mother 18  . Hypertension  Father   . Heart failure Father   . Cancer Brother        bladder  . Cancer Sister        breast  . Breast cancer Sister 58  . Hypertension Son   . Hypertension Daughter   . Breast cancer Sister 51    Social History   Social History  . Marital status: Widowed    Spouse name: N/A  . Number of children: 2  . Years of education: N/A   Occupational History  . Custodian    Social History Main Topics  . Smoking status: Former Smoker    Quit date: 09/09/2007  . Smokeless tobacco: Never Used  . Alcohol use No  . Drug use: No  . Sexual activity: Not Currently   Other Topics Concern  . Not on file   Social History Narrative  . No narrative on file     Current Outpatient Prescriptions:  .  acetaminophen (TYLENOL) 500 MG tablet, Take 1,000-1,500 mg by mouth every 6 (six) hours as needed for mild pain., Disp: , Rfl:  .  allopurinol (ZYLOPRIM) 300 MG tablet, Take 1 tablet (300 mg total) by mouth daily., Disp: 90 tablet, Rfl: 1 .  aspirin EC 81 MG tablet, Take 81 mg by mouth every evening., Disp: , Rfl:  .  COLCRYS 0.6 MG tablet, TAKE 2 TABS BY MOUTH AT ONSET OF PAIN. REPEAT WITH 1 TAB IN 1 HOUR. *MAX 3TABS/24 HOURS*, Disp: 30 tablet, Rfl: 1 .  conjugated estrogens (PREMARIN) vaginal cream, Place 1 Applicatorful vaginally at bedtime. For two weeks after that use two times weekly, Disp: 30 g, Rfl: 0 .  digoxin (LANOXIN) 0.125 MG tablet, TAKE 1 TABLET (0.125 MG TOTAL) BY MOUTH ONCE DAILY., Disp: , Rfl: 6 .  ezetimibe (ZETIA) 10 MG tablet, Take 1 tablet (10 mg total) by mouth daily., Disp: 90 tablet, Rfl: 1 .  fluticasone (FLONASE) 50 MCG/ACT nasal spray, Place 2 sprays into both nostrils daily., Disp: 16 g, Rfl: 6 .  KLOR-CON M20 20 MEQ tablet, TAKE 1 TABLET BY MOUTH ONCE A DAY, Disp: 90 tablet, Rfl: 1 .  loratadine (CLARITIN) 10 MG tablet, Take 10 mg by mouth daily as needed for allergies., Disp: , Rfl:  .  metoprolol tartrate (LOPRESSOR) 25 MG tablet, Take 1 tablet (25 mg total) by  mouth 2 (two) times daily., Disp: 180 tablet, Rfl: 1 .  SPIRIVA HANDIHALER 18 MCG inhalation capsule, INHALE 1 CASPULE VIA HANDIHALER ONCE A DAY AT THE SAME TIME EVERY DAY, Disp: 90 capsule, Rfl: 3 .  temazepam (RESTORIL) 15 MG capsule, Take 1 capsule (15 mg total) by mouth at bedtime as needed for sleep., Disp: 30 capsule, Rfl: 2 .  torsemide (DEMADEX) 20 MG tablet, Take 1 tablet (20 mg total) by mouth daily., Disp: 90 tablet, Rfl: 1 .  warfarin (COUMADIN) 5 MG tablet, Take 5-7.5 mg by mouth every evening. Pt takes one tablet Sunday, Monday, Wednesday, and Friday.  Pt takes one and one-half tablet on Tuesday,  Thursday, and Saturday., Disp: , Rfl:   Allergies  Allergen Reactions  . Ace Inhibitors Cough and Other (See Comments)  . Amoxicillin-Pot Clavulanate Nausea And Vomiting  . Codeine Nausea And Vomiting  . Penicillins Nausea And Vomiting and Other (See Comments)    Has patient had a PCN reaction causing immediate rash, facial/tongue/throat swelling, SOB or lightheadedness with hypotension: No Has patient had a PCN reaction causing severe rash involving mucus membranes or skin necrosis: No Has patient had a PCN reaction that required hospitalization No Has patient had a PCN reaction occurring within the last 10 years: No If all of the above answers are "NO", then may proceed with Cephalosporin use.  . Rosuvastatin Other (See Comments)    Reaction:  Joint pain   . Tetanus-Diphtheria Toxoids Td Swelling     ROS  Constitutional: Negative for fever or weight change.  Respiratory: Positive for cough and intermittent shortness of breath.   Cardiovascular: Negative for chest pain or palpitations.  Gastrointestinal: Negative for abdominal pain, no bowel changes.  Musculoskeletal: Negative for gait problem or joint swelling.  Skin: Negative for rash.  Neurological: Negative for dizziness or headache.  No other specific complaints in a complete review of systems (except as listed in HPI  above).  Objective  Vitals:   04/14/17 1016  BP: 132/82  Pulse: 80  Resp: 18  Temp: (!) 97.4 F (36.3 C)  SpO2: 97%  Weight: 171 lb 8 oz (77.8 kg)  Height: 5\' 4"  (1.626 m)    Body mass index is 29.44 kg/m.  Physical Exam  Constitutional: Patient appears well-developed Overweight.  No distress.  HEENT: head atraumatic, normocephalic, pupils equal and reactive to light, right TM shows scar tissue, no nystagmus, neck supple, throat within normal limits Cardiovascular: Irregular Rhythm , systolic ejection murmur 3/6 No BLE edema. Pulmonary/Chest: Effort normal and breath sounds normal. No respiratory distress. Abdominal: Soft. There is no tenderness. Psychiatric: Patient has a normal mood and affect. behavior is normal. Judgment and thought content normal. Neurological : Awake, alert and oriented. No focal findings  PHQ2/9: Depression screen [facility] 2/9 08/12/2016 04/15/2016 02/14/2016 01/14/2016 01/10/2016  Decreased Interest 0 0 0 0 0  Down, Depressed, Hopeless 0 0 0 0 0  PHQ - 2 Score 0 0 0 0 0  Altered sleeping - - - - -  Tired, decreased energy - - - - -  Change in appetite - - - - -  Feeling bad or failure about yourself  - - - - -  Trouble concentrating - - - - -  Moving slowly or fidgety/restless - - - - -  Suicidal thoughts - - - - -  PHQ-9 Score - - - - -  Difficult doing work/chores - - - - -     Fall Risk: Fall Risk  08/12/2016 04/15/2016 02/14/2016 01/14/2016 01/10/2016  Falls in the past year? No No No No No     Assessment & Plan  1. Simple chronic bronchitis ([place])  Doing well, off medications  2. Migraine with aura and without status migrainosus, not intractable  stable  3. Dyslipidemia  She did not get fill Zetia because of cost  4. Paroxysmal atrial fibrillation (HCC)  On coumadin, no side effects, sees Dr. Clayborn Bigness  5. Benign hypertension  - torsemide (DEMADEX) 20 MG tablet; Take 1 tablet (20 mg total) by mouth daily.  Dispense: 90 tablet; Refill:  1  6. Hyperglycemia  Discussed life style modification   7. Chronic combined systolic  and diastolic CHF, NYHA class 1 (HCC)  - torsemide (DEMADEX) 20 MG tablet; Take 1 tablet (20 mg total) by mouth daily.  Dispense: 90 tablet; Refill: 1  8. Other insomnia  - temazepam (RESTORIL) 15 MG capsule; Take 1 capsule (15 mg total) by mouth at bedtime as needed for sleep.  Dispense: 30 capsule; Refill: 2  9. Dysmetabolic syndrome  Discussed life style modification  10. Anxiety  - temazepam (RESTORIL) 15 MG capsule; Take 1 capsule (15 mg total) by mouth at bedtime as needed for sleep.  Dispense: 30 capsule; Refill: 2

## 2017-04-17 ENCOUNTER — Other Ambulatory Visit: Payer: Self-pay | Admitting: Family Medicine

## 2017-04-17 NOTE — Telephone Encounter (Signed)
Patient requesting refill of K[place]-[facility] to [facility].

## 2017-05-07 DIAGNOSIS — I480 Paroxysmal atrial fibrillation: Secondary | ICD-10-CM | POA: Diagnosis not present

## 2017-05-13 ENCOUNTER — Encounter: Payer: Self-pay | Admitting: Family Medicine

## 2017-05-13 ENCOUNTER — Ambulatory Visit: Payer: Medicare Other | Admitting: Family Medicine

## 2017-05-13 ENCOUNTER — Ambulatory Visit (INDEPENDENT_AMBULATORY_CARE_PROVIDER_SITE_OTHER): Payer: Medicare Other | Admitting: Family Medicine

## 2017-05-13 VITALS — BP 136/82 | HR 66 | Temp 98.1°F | Resp 16 | Wt 176.0 lb

## 2017-05-13 DIAGNOSIS — M25521 Pain in right elbow: Secondary | ICD-10-CM | POA: Diagnosis not present

## 2017-05-13 DIAGNOSIS — M25421 Effusion, right elbow: Secondary | ICD-10-CM

## 2017-05-13 DIAGNOSIS — Z9229 Personal history of other drug therapy: Secondary | ICD-10-CM

## 2017-05-13 DIAGNOSIS — M7021 Olecranon bursitis, right elbow: Secondary | ICD-10-CM | POA: Diagnosis not present

## 2017-05-13 MED ORDER — INDOMETHACIN 50 MG PO CAPS: 50.0000 mg | ORAL_CAPSULE | Freq: Two times a day (BID) | ORAL | 0 refills | Status: DC

## 2017-05-13 NOTE — Progress Notes (Signed)
Name: STEPHANIEMARIE Higgins   MRN: [id number]    DOB: Mar 14, 1941   Date:05/13/2017       Progress Note  Subjective  Chief Complaint  Chief Complaint  Patient presents with  . Gout    Having a flair up in her right elbow for the past 4 days, constant pain, soreness, tightness, swollen unable to drop her arm down. Has been taking Tylenol and half Tramadol for pain with gout medication.    HPI  Right elbow pain and swelling: symptoms started gradually on Sunday. She felt some pain and popping sensation initially, two days ago she woke up with swelling , pain and inability to move her elbow. She had colchicine at home and took it Monday and Tuesday without any improvement of her symptoms. She states she cannot tolerate steroids by mouth, she has not been taking any other medications. She is on coumadin for valvular replacement and INR was high last week at 4.0. Medication was held for a day last week and again yesterday and today. She will resume it on Friday. Explained medication I will give to her can cause GI bleeding and also affect coumadin levels.    Patient Active Problem List   Diagnosis Date Noted  . Chronic systolic heart failure ([place]) 10/08/2015  . Chronic obstructive pulmonary disease ([place]) 07/04/2015  . Controlled gout 06/06/2015  . Anxiety 02/09/2015  . Synovial cyst of popliteal space 02/09/2015  . Benign hypertension 02/09/2015  . Blood type A+ 02/09/2015  . Arterial branch occlusion of retina 02/09/2015  . Chronic constipation 02/09/2015  . Insomnia, persistent 02/09/2015  . Chronic combined systolic and diastolic CHF, NYHA class 1 ([place]) 02/09/2015  . Compromised kidney function 02/09/2015  . Dyslipidemia 02/09/2015  . Atrial fibrillation ([place]) 02/09/2015  . Arthritis urica 02/09/2015  . Hearing loss 02/09/2015  . History of open heart surgery 02/09/2015  . Chronic hoarseness 02/09/2015  . Cardiomegaly 02/09/2015  . Dysmetabolic syndrome 57/84/6962  . Migraine with aura  and without status migrainosus, not intractable 02/09/2015  . Adult BMI 30+ 02/09/2015  . Hypertensive pulmonary vascular disease ([place]) 02/09/2015  . Allergic rhinitis, seasonal 02/09/2015  . Vitamin D deficiency 02/09/2015  . History of mitral valve repair 10/01/2012  . History of aortic valve replacement 10/01/2012  . Congestive heart failure ([place]) 10/01/2012    Past Surgical History:  Procedure Laterality Date  . ABDOMINAL HYSTERECTOMY  1996  . BREAST BIOPSY Right 1998   neg  . BREAST BIOPSY Right 2007   neg  . BREAST BIOPSY Right 1992   positive  . BREAST EXCISIONAL BIOPSY Left 2000   neg  . CARDIAC VALVE REPLACEMENT  2014    Family History  Problem Relation Age of Onset  . Cancer Mother   . Diabetes Mother   . Hypertension Mother   . Breast cancer Mother 36  . Hypertension Father   . Heart failure Father   . Cancer Brother        bladder  . Cancer Sister        breast  . Breast cancer Sister 38  . Hypertension Son   . Hypertension Daughter   . Breast cancer Sister 20    Social History   Social History  . Marital status: Widowed    Spouse name: N/A  . Number of children: 2  . Years of education: N/A   Occupational History  . Custodian    Social History Main Topics  . Smoking status: Former Smoker  Quit date: 09/09/2007  . Smokeless tobacco: Never Used  . Alcohol use No  . Drug use: No  . Sexual activity: Not Currently   Other Topics Concern  . Not on file   Social History Narrative  . No narrative on file     Current Outpatient Prescriptions:  .  acetaminophen (TYLENOL) 500 MG tablet, Take 1,000-1,500 mg by mouth every 6 (six) hours as needed for mild pain., Disp: , Rfl:  .  allopurinol (ZYLOPRIM) 300 MG tablet, Take 1 tablet (300 mg total) by mouth daily., Disp: 90 tablet, Rfl: 1 .  aspirin EC 81 MG tablet, Take 81 mg by mouth every evening., Disp: , Rfl:  .  COLCRYS 0.6 MG tablet, TAKE 2 TABS BY MOUTH AT ONSET OF PAIN. REPEAT WITH 1 TAB  IN 1 HOUR. *MAX 3TABS/24 HOURS*, Disp: 30 tablet, Rfl: 1 .  digoxin (LANOXIN) 0.125 MG tablet, TAKE 1 TABLET (0.125 MG TOTAL) BY MOUTH ONCE DAILY., Disp: , Rfl: 6 .  KLOR-CON M20 20 MEQ tablet, TAKE 1 TABLET BY MOUTH ONCE A DAY, Disp: 90 tablet, Rfl: 1 .  metoprolol tartrate (LOPRESSOR) 25 MG tablet, Take 1 tablet (25 mg total) by mouth 2 (two) times daily., Disp: 180 tablet, Rfl: 1 .  SPIRIVA HANDIHALER 18 MCG inhalation capsule, INHALE 1 CASPULE VIA HANDIHALER ONCE A DAY AT THE SAME TIME EVERY DAY, Disp: 90 capsule, Rfl: 3 .  temazepam (RESTORIL) 15 MG capsule, Take 1 capsule (15 mg total) by mouth at bedtime as needed for sleep., Disp: 30 capsule, Rfl: 2 .  torsemide (DEMADEX) 20 MG tablet, Take 1 tablet (20 mg total) by mouth daily., Disp: 90 tablet, Rfl: 1 .  warfarin (COUMADIN) 5 MG tablet, Take 5-7.5 mg by mouth every evening. Pt takes one tablet Sunday, Monday, Wednesday, and Friday.  Pt takes one and one-half tablet on Tuesday, Thursday, and Saturday., Disp: , Rfl:  .  conjugated estrogens (PREMARIN) vaginal cream, Place 1 Applicatorful vaginally at bedtime. For two weeks after that use two times weekly (Patient not taking: Reported on 05/13/2017), Disp: 30 g, Rfl: 0 .  indomethacin (INDOCIN) 50 MG capsule, Take 1 capsule (50 mg total) by mouth 2 (two) times daily with a meal., Disp: 14 capsule, Rfl: 0 .  loratadine (CLARITIN) 10 MG tablet, Take 10 mg by mouth daily as needed for allergies., Disp: , Rfl:   Allergies  Allergen Reactions  . Ace Inhibitors Cough and Other (See Comments)  . Amoxicillin-Pot Clavulanate Nausea And Vomiting  . Codeine Nausea And Vomiting  . Penicillins Nausea And Vomiting and Other (See Comments)    Has patient had a PCN reaction causing immediate rash, facial/tongue/throat swelling, SOB or lightheadedness with hypotension: No Has patient had a PCN reaction causing severe rash involving mucus membranes or skin necrosis: No Has patient had a PCN reaction that  required hospitalization No Has patient had a PCN reaction occurring within the last 10 years: No If all of the above answers are "NO", then may proceed with Cephalosporin use.  . Rosuvastatin Other (See Comments)    Reaction:  Joint pain   . Tetanus-Diphtheria Toxoids Td Swelling     ROS  Ten systems reviewed and is negative except as mentioned in HPI   Objective  Vitals:   05/13/17 0941  BP: 136/82  Pulse: 66  Resp: 16  Temp: 98.1 F (36.7 C)  TempSrc: Oral  SpO2: 95%  Weight: 176 lb (79.8 kg)    Body mass index is  30.21 kg/m.  Physical Exam  Constitutional: Patient appears well-developed Obese  No distress.  HEENT: head atraumatic, normocephalic, pupils equal and reactive to light, no nystagmus, neck supple, throat within normal limits Cardiovascular: Irregular Rhythm , systolic ejection murmur 3/6 No BLE edema. Pulmonary/Chest: Effort normal and breath sounds normal. No respiratory distress. Abdominal: Soft. There is no tenderness. Psychiatric: Patient has a normal mood and affect. behavior is normal. Judgment and thought content normal. Muscular Skeletal: decrease rom of right elbow, no erythema, there is swelling and increase in warmth. Normal rom of shoulders.   PHQ2/9: Depression screen [facility] 2/9 05/13/2017 08/12/2016 04/15/2016 02/14/2016 01/14/2016  Decreased Interest 0 0 0 0 0  Down, Depressed, Hopeless 0 0 0 0 0  PHQ - 2 Score 0 0 0 0 0  Altered sleeping - - - - -  Tired, decreased energy - - - - -  Change in appetite - - - - -  Feeling bad or failure about yourself  - - - - -  Trouble concentrating - - - - -  Moving slowly or fidgety/restless - - - - -  Suicidal thoughts - - - - -  PHQ-9 Score - - - - -  Difficult doing work/chores - - - - -     Fall Risk: Fall Risk  05/13/2017 08/12/2016 04/15/2016 02/14/2016 01/14/2016  Falls in the past year? No No No No No    Functional Status Survey: Is the patient deaf or have difficulty hearing?: No Does the patient  have difficulty seeing, even when wearing glasses/contacts?: No Does the patient have difficulty concentrating, remembering, or making decisions?: No Does the patient have difficulty walking or climbing stairs?: No Does the patient have difficulty dressing or bathing?: No Does the patient have difficulty doing errands alone such as visiting a doctor's office or shopping?: No   Assessment & Plan  1. Right elbow pain  It does not seem to be gout, not very sensitive to touch, more likely bursitis. We will try indomethacin and referral to Ortho for possible drainage and injection of steroid . She tried colchicine without help  - Ambulatory referral to Orthopedic Surgery - indomethacin (INDOCIN) 50 MG capsule; Take 1 capsule (50 mg total) by mouth 2 (two) times daily with a meal.  Dispense: 14 capsule; Refill: 0  2. Swelling of right elbow  - Ambulatory referral to Orthopedic Surgery - indomethacin (INDOCIN) 50 MG capsule; Take 1 capsule (50 mg total) by mouth 2 (two) times daily with a meal.  Dispense: 14 capsule; Refill: 0  3. History of Coumadin therapy  Her last INR was 4.0 last week and has been off coumadin since, will resume medication on Friday, she has been off for two days, explained that Indocin can increase INR and may need to stop taking it on Friday when she resumes coumadin. Patient understands and will follow up with cardiologist sooner if needed

## 2017-05-13 NOTE — Patient Instructions (Signed)
Bursitis Bursitis is inflammation and irritation of a bursa, which is one of the small, fluid-filled sacs that cushion and protect the moving parts of your body. These sacs are located between bones and muscles, muscle attachments, or skin areas next to bones. A bursa protects these structures from the wear and tear that results from frequent movement. An inflamed bursa causes pain and swelling. Fluid may build up inside the sac. Bursitis is most common near joints, especially the knees, elbows, hips, and shoulders. What are the causes? Bursitis can be caused by:  Injury from: ? A direct blow, like falling on your knee or elbow. ? Overuse of a joint (repetitive stress).  Infection. This can happen if bacteria gets into a bursa through a cut or scrape near a joint.  Diseases that cause joint inflammation, such as gout and rheumatoid arthritis.  What increases the risk? You may be at risk for bursitis if you:  Have a job or hobby that involves a lot of repetitive stress on your joints.  Have a condition that weakens your body's defense system (immune system), such as diabetes, cancer, or HIV.  Lift and reach overhead often.  Kneel or lean on hard surfaces often.  Run or walk often.  What are the signs or symptoms? The most common signs and symptoms of bursitis are:  Pain that gets worse when you move the affected body part or put weight on it.  Inflammation.  Stiffness.  Other signs and symptoms may include:  Redness.  Tenderness.  Warmth.  Pain that continues after rest.  Fever and chills. This may occur in bursitis caused by infection.  How is this diagnosed? Bursitis may be diagnosed by:  Medical history and physical exam.  MRI.  A procedure to drain fluid from the bursa with a needle (aspiration). The fluid may be checked for signs of infection or gout.  Blood tests to rule out other causes of inflammation.  How is this treated? Bursitis can usually be  treated at home with rest, ice, compression, and elevation (RICE). For mild bursitis, RICE treatment may be all you need. Other treatments may include:  Nonsteroidal anti-inflammatory drugs (NSAIDs) to treat pain and inflammation.  Corticosteroids to fight inflammation. You may have these drugs injected into and around the area of bursitis.  Aspiration of bursitis fluid to relieve pain and improve movement.  Antibiotic medicine to treat an infected bursa.  A splint, brace, or walking aid.  Physical therapy if you continue to have pain or limited movement.  Surgery to remove a damaged or infected bursa. This may be needed if you have a very bad case of bursitis or if other treatments have not worked.  Follow these instructions at home:  Take medicines only as directed by your health care provider.  If you were prescribed an antibiotic medicine, finish it all even if you start to feel better.  Rest the affected area as directed by your health care provider. ? Keep the area elevated. ? Avoid activities that make pain worse.  Apply ice to the injured area: ? Place ice in a plastic bag. ? Place a towel between your skin and the bag. ? Leave the ice on for 20 minutes, 2-3 times a day.  Use splints, braces, pads, or walking aids as directed by your health care provider.  Keep all follow-up visits as directed by your health care provider. This is important. How is this prevented?  Wear knee pads if you kneel often.    Wear sturdy running or walking shoes that fit you well.  Take regular breaks from repetitive activity.  Warm up by stretching before doing any strenuous activity.  Maintain a healthy weight or lose weight as recommended by your health care provider. Ask your health care provider if you need help.  Exercise regularly. Start any new physical activity gradually. Contact a health care provider if:  Your bursitis is not responding to treatment or home care.  You have  a fever.  You have chills. This information is not intended to replace advice given to you by your health care provider. Make sure you discuss any questions you have with your health care provider. Document Released: 08/22/2000 Document Revised: 01/31/2016 Document Reviewed: 11/14/2013 Elsevier Interactive Patient Education  2018 Elsevier Inc.  

## 2017-05-15 DIAGNOSIS — M7021 Olecranon bursitis, right elbow: Secondary | ICD-10-CM | POA: Diagnosis not present

## 2017-05-19 DIAGNOSIS — M7021 Olecranon bursitis, right elbow: Secondary | ICD-10-CM | POA: Diagnosis not present

## 2017-05-26 DIAGNOSIS — Z7901 Long term (current) use of anticoagulants: Secondary | ICD-10-CM | POA: Diagnosis not present

## 2017-06-25 DIAGNOSIS — I480 Paroxysmal atrial fibrillation: Secondary | ICD-10-CM | POA: Diagnosis not present

## 2017-06-29 ENCOUNTER — Ambulatory Visit (INDEPENDENT_AMBULATORY_CARE_PROVIDER_SITE_OTHER): Payer: Medicare Other

## 2017-06-29 DIAGNOSIS — Z23 Encounter for immunization: Secondary | ICD-10-CM

## 2017-07-14 DIAGNOSIS — R011 Cardiac murmur, unspecified: Secondary | ICD-10-CM | POA: Diagnosis not present

## 2017-07-14 DIAGNOSIS — E669 Obesity, unspecified: Secondary | ICD-10-CM | POA: Diagnosis not present

## 2017-07-14 DIAGNOSIS — Z9889 Other specified postprocedural states: Secondary | ICD-10-CM | POA: Diagnosis not present

## 2017-07-14 DIAGNOSIS — R0602 Shortness of breath: Secondary | ICD-10-CM | POA: Diagnosis not present

## 2017-07-14 DIAGNOSIS — R000 Tachycardia, unspecified: Secondary | ICD-10-CM | POA: Diagnosis not present

## 2017-07-14 DIAGNOSIS — E7849 Other hyperlipidemia: Secondary | ICD-10-CM | POA: Diagnosis not present

## 2017-07-14 DIAGNOSIS — M1A00X0 Idiopathic chronic gout, unspecified site, without tophus (tophi): Secondary | ICD-10-CM | POA: Diagnosis not present

## 2017-07-14 DIAGNOSIS — D6832 Hemorrhagic disorder due to extrinsic circulating anticoagulants: Secondary | ICD-10-CM | POA: Diagnosis not present

## 2017-07-14 DIAGNOSIS — I480 Paroxysmal atrial fibrillation: Secondary | ICD-10-CM | POA: Diagnosis not present

## 2017-07-14 DIAGNOSIS — Z952 Presence of prosthetic heart valve: Secondary | ICD-10-CM | POA: Diagnosis not present

## 2017-07-14 DIAGNOSIS — Z8774 Personal history of (corrected) congenital malformations of heart and circulatory system: Secondary | ICD-10-CM | POA: Diagnosis not present

## 2017-07-31 ENCOUNTER — Emergency Department: Payer: Medicare Other

## 2017-07-31 ENCOUNTER — Encounter: Payer: Self-pay | Admitting: Emergency Medicine

## 2017-07-31 ENCOUNTER — Other Ambulatory Visit: Payer: Self-pay

## 2017-07-31 ENCOUNTER — Inpatient Hospital Stay: Payer: Medicare Other

## 2017-07-31 ENCOUNTER — Inpatient Hospital Stay
Admission: EM | Admit: 2017-07-31 | Discharge: 2017-08-01 | DRG: 563 | Disposition: A | Discharge status: Other Acute Inpatient Hospital | Payer: Medicare Other | Attending: Internal Medicine | Admitting: Internal Medicine

## 2017-07-31 DIAGNOSIS — I110 Hypertensive heart disease with heart failure: Secondary | ICD-10-CM | POA: Diagnosis present

## 2017-07-31 DIAGNOSIS — I480 Paroxysmal atrial fibrillation: Secondary | ICD-10-CM | POA: Diagnosis present

## 2017-07-31 DIAGNOSIS — Z803 Family history of malignant neoplasm of breast: Secondary | ICD-10-CM

## 2017-07-31 DIAGNOSIS — Z7982 Long term (current) use of aspirin: Secondary | ICD-10-CM

## 2017-07-31 DIAGNOSIS — J449 Chronic obstructive pulmonary disease, unspecified: Secondary | ICD-10-CM | POA: Diagnosis present

## 2017-07-31 DIAGNOSIS — Z8249 Family history of ischemic heart disease and other diseases of the circulatory system: Secondary | ICD-10-CM

## 2017-07-31 DIAGNOSIS — M109 Gout, unspecified: Secondary | ICD-10-CM | POA: Diagnosis present

## 2017-07-31 DIAGNOSIS — R9431 Abnormal electrocardiogram [ECG] [EKG]: Secondary | ICD-10-CM | POA: Diagnosis not present

## 2017-07-31 DIAGNOSIS — S82142A Displaced bicondylar fracture of left tibia, initial encounter for closed fracture: Secondary | ICD-10-CM

## 2017-07-31 DIAGNOSIS — Z833 Family history of diabetes mellitus: Secondary | ICD-10-CM | POA: Diagnosis not present

## 2017-07-31 DIAGNOSIS — E785 Hyperlipidemia, unspecified: Secondary | ICD-10-CM | POA: Diagnosis present

## 2017-07-31 DIAGNOSIS — Z853 Personal history of malignant neoplasm of breast: Secondary | ICD-10-CM | POA: Diagnosis not present

## 2017-07-31 DIAGNOSIS — I5042 Chronic combined systolic (congestive) and diastolic (congestive) heart failure: Secondary | ICD-10-CM | POA: Diagnosis present

## 2017-07-31 DIAGNOSIS — Z952 Presence of prosthetic heart valve: Secondary | ICD-10-CM | POA: Diagnosis not present

## 2017-07-31 DIAGNOSIS — M25462 Effusion, left knee: Secondary | ICD-10-CM | POA: Diagnosis not present

## 2017-07-31 DIAGNOSIS — W109XXA Fall (on) (from) unspecified stairs and steps, initial encounter: Secondary | ICD-10-CM | POA: Diagnosis present

## 2017-07-31 DIAGNOSIS — Z87891 Personal history of nicotine dependence: Secondary | ICD-10-CM

## 2017-07-31 DIAGNOSIS — Z7901 Long term (current) use of anticoagulants: Secondary | ICD-10-CM | POA: Diagnosis not present

## 2017-07-31 DIAGNOSIS — Y92008 Other place in unspecified non-institutional (private) residence as the place of occurrence of the external cause: Secondary | ICD-10-CM

## 2017-07-31 DIAGNOSIS — I4891 Unspecified atrial fibrillation: Secondary | ICD-10-CM | POA: Diagnosis not present

## 2017-07-31 DIAGNOSIS — S0990XA Unspecified injury of head, initial encounter: Secondary | ICD-10-CM | POA: Diagnosis not present

## 2017-07-31 HISTORY — DX: Presence of prosthetic heart valve: Z95.2

## 2017-07-31 HISTORY — DX: Unspecified atrial fibrillation: I48.91

## 2017-07-31 LAB — COMPREHENSIVE METABOLIC PANEL
ALK PHOS: 82 U/L (ref 38–126)
ALT: 21 U/L (ref 14–54)
AST: 32 U/L (ref 15–41)
Albumin: 4.2 g/dL (ref 3.5–5.0)
Anion gap: 11 (ref 5–15)
BILIRUBIN TOTAL: 0.3 mg/dL (ref 0.3–1.2)
BUN: 25 mg/dL — AB (ref 6–20)
CALCIUM: 9.5 mg/dL (ref 8.9–10.3)
CHLORIDE: 102 mmol/L (ref 101–111)
CO2: 28 mmol/L (ref 22–32)
CREATININE: 1.03 mg/dL — AB (ref 0.44–1.00)
GFR, EST NON AFRICAN AMERICAN: 52 mL/min — AB (ref >60)
Glucose, Bld: 125 mg/dL — ABNORMAL HIGH (ref 65–99)
Potassium: 3 mmol/L — ABNORMAL LOW (ref 3.5–5.1)
Sodium: 141 mmol/L (ref 135–145)
Total Protein: 7.4 g/dL (ref 6.5–8.1)

## 2017-07-31 LAB — CBC WITH DIFFERENTIAL/PLATELET
BASOS PCT: 1 %
Basophils Absolute: 0.1 10*3/uL (ref 0–0.1)
EOS ABS: 0 10*3/uL (ref 0–0.7)
EOS PCT: 0 %
HCT: 38.7 % (ref 35.0–47.0)
HEMOGLOBIN: 12.6 g/dL (ref 12.0–16.0)
LYMPHS ABS: 1.9 10*3/uL (ref 1.0–3.6)
Lymphocytes Relative: 17 %
MCH: 29.2 pg (ref 26.0–34.0)
MCHC: 32.6 g/dL (ref 32.0–36.0)
MCV: 89.4 fL (ref 80.0–100.0)
Monocytes Absolute: 0.6 10*3/uL (ref 0.2–0.9)
Monocytes Relative: 6 %
NEUTROS PCT: 76 %
Neutro Abs: 8.8 10*3/uL — ABNORMAL HIGH (ref 1.4–6.5)
PLATELETS: 208 10*3/uL (ref 150–440)
RBC: 4.33 MIL/uL (ref 3.80–5.20)
RDW: 15.3 % — ABNORMAL HIGH (ref 11.5–14.5)
WBC: 11.4 10*3/uL — AB (ref 3.6–11.0)

## 2017-07-31 LAB — DIGOXIN LEVEL: Digoxin Level: <0.2 ng/mL — ABNORMAL LOW (ref 0.8–2.0)

## 2017-07-31 LAB — TYPE AND SCREEN
ABO/RH(D): A POS
ANTIBODY SCREEN: NEGATIVE

## 2017-07-31 LAB — SURGICAL PCR SCREEN
MRSA, PCR: NEGATIVE
STAPHYLOCOCCUS AUREUS: NEGATIVE

## 2017-07-31 LAB — PROTIME-INR
INR: 2.83
Prothrombin Time: 29.5 seconds — ABNORMAL HIGH (ref 11.4–15.2)

## 2017-07-31 MED ORDER — POTASSIUM CHLORIDE CRYS ER 20 MEQ PO TBCR
40.0000 meq | EXTENDED_RELEASE_TABLET | Freq: Once | ORAL | Status: AC
  Administered 2017-07-31: 40 meq via ORAL
  Filled 2017-07-31: qty 2

## 2017-07-31 MED ORDER — EZETIMIBE 10 MG PO TABS
10.0000 mg | ORAL_TABLET | Freq: Every day | ORAL | Status: DC
  Administered 2017-07-31 – 2017-08-01 (×2): 10 mg via ORAL
  Filled 2017-07-31 (×2): qty 1

## 2017-07-31 MED ORDER — TRAMADOL HCL 50 MG PO TABS
50.0000 mg | ORAL_TABLET | Freq: Four times a day (QID) | ORAL | Status: DC | PRN
  Administered 2017-07-31 – 2017-08-01 (×3): 50 mg via ORAL
  Filled 2017-07-31 (×3): qty 1

## 2017-07-31 MED ORDER — TIOTROPIUM BROMIDE MONOHYDRATE 18 MCG IN CAPS
18.0000 ug | ORAL_CAPSULE | Freq: Every day | RESPIRATORY_TRACT | Status: DC
  Administered 2017-07-31: 18 ug via RESPIRATORY_TRACT
  Filled 2017-07-31: qty 5

## 2017-07-31 MED ORDER — POTASSIUM CHLORIDE CRYS ER 20 MEQ PO TBCR
20.0000 meq | EXTENDED_RELEASE_TABLET | Freq: Every day | ORAL | Status: DC
  Administered 2017-08-01: 20 meq via ORAL
  Filled 2017-07-31: qty 1

## 2017-07-31 MED ORDER — DIGOXIN 125 MCG PO TABS
0.1250 mg | ORAL_TABLET | Freq: Every day | ORAL | Status: DC
  Administered 2017-08-01: 0.125 mg via ORAL
  Filled 2017-07-31 (×2): qty 1

## 2017-07-31 MED ORDER — PHYTONADIONE 5 MG PO TABS
5.0000 mg | ORAL_TABLET | ORAL | Status: AC
  Administered 2017-07-31: 5 mg via ORAL
  Filled 2017-07-31: qty 1

## 2017-07-31 MED ORDER — ACETAMINOPHEN 325 MG PO TABS: 650.0000 mg | ORAL_TABLET | Freq: Four times a day (QID) | ORAL | Status: DC | PRN

## 2017-07-31 MED ORDER — ACETAMINOPHEN 650 MG RE SUPP: 650.0000 mg | Freq: Four times a day (QID) | RECTAL | Status: DC | PRN

## 2017-07-31 MED ORDER — ONDANSETRON HCL 4 MG PO TABS: 4.0000 mg | ORAL_TABLET | Freq: Four times a day (QID) | ORAL | Status: DC | PRN

## 2017-07-31 MED ORDER — ALLOPURINOL 300 MG PO TABS
300.0000 mg | ORAL_TABLET | Freq: Every day | ORAL | Status: DC
  Administered 2017-08-01: 300 mg via ORAL
  Filled 2017-07-31: qty 1

## 2017-07-31 MED ORDER — METOPROLOL TARTRATE 25 MG PO TABS
12.5000 mg | ORAL_TABLET | Freq: Two times a day (BID) | ORAL | Status: DC
  Administered 2017-07-31 – 2017-08-01 (×2): 12.5 mg via ORAL
  Filled 2017-07-31 (×2): qty 1

## 2017-07-31 MED ORDER — ONDANSETRON HCL 4 MG/2ML IJ SOLN
4.0000 mg | Freq: Once | INTRAMUSCULAR | Status: AC
  Administered 2017-07-31: 4 mg via INTRAVENOUS
  Filled 2017-07-31: qty 2

## 2017-07-31 MED ORDER — LORATADINE 10 MG PO TABS: 10.0000 mg | ORAL_TABLET | Freq: Every day | ORAL | Status: DC | PRN

## 2017-07-31 MED ORDER — TORSEMIDE 20 MG PO TABS
20.0000 mg | ORAL_TABLET | Freq: Two times a day (BID) | ORAL | Status: DC
  Administered 2017-07-31 – 2017-08-01 (×3): 20 mg via ORAL
  Filled 2017-07-31 (×3): qty 1

## 2017-07-31 MED ORDER — MORPHINE SULFATE (PF) 2 MG/ML IV SOLN
2.0000 mg | INTRAVENOUS | Status: DC | PRN
  Administered 2017-07-31 – 2017-08-01 (×5): 2 mg via INTRAVENOUS
  Filled 2017-07-31 (×5): qty 1

## 2017-07-31 MED ORDER — ONDANSETRON HCL 4 MG/2ML IJ SOLN: 4.0000 mg | Freq: Four times a day (QID) | INTRAMUSCULAR | Status: DC | PRN

## 2017-07-31 MED ORDER — MORPHINE SULFATE (PF) 4 MG/ML IV SOLN
6.0000 mg | Freq: Once | INTRAVENOUS | Status: AC
  Administered 2017-07-31: 6 mg via INTRAVENOUS
  Filled 2017-07-31: qty 2

## 2017-07-31 NOTE — Progress Notes (Signed)
ADMISSION NOTE:  Pt admitted to room 159 from ER. Pt alert and oriented X4. Knee immobilizer in place. Family at the bedside. Skin assessment and navigator completed. Sacral foam and tele monitor applied. Bed in lowest position call bell in reach and bed alarm on.

## 2017-07-31 NOTE — H&P (Addendum)
[place] [facility] [place] NAME: [name]    MR#:  [id number]  DATE OF BIRTH:  Jan 28, 1941  DATE OF ADMISSION:  07/31/2017  PRIMARY CARE PHYSICIAN: [name], MD   REQUESTING/REFERRING PHYSICIAN: Dr Darel Hong  CHIEF COMPLAINT:   Chief Complaint  Patient presents with  . Knee Injury    HISTORY OF PRESENT ILLNESS:  [name]  is a 76 y.o. female was out on her back porch sweeping some leaves and she stepped back and fell down 4 steps she landed on her face and leg.  Patient could not lift her left leg.  Family picked her up and carried her into the house and put her in a recliner.  She was always able to wiggle her toes.  She had severe pain in her left knee area with any type of movement turn of her leg.  Pain 3 out of 10 in intensity if just sitting there.  PAST MEDICAL HISTORY:   Past Medical History:  Diagnosis Date  . Atrial fibrillation ([place])   . Breast cancer ([place]) 1992   positive, radiation  . CHF (congestive heart failure) ([place])   . COPD (chronic obstructive pulmonary disease) ([place])   . History of aortic valve replacement   . Hyperlipidemia     PAST SURGICAL HISTORY:   Past Surgical History:  Procedure Laterality Date  . ABDOMINAL HYSTERECTOMY  1996  . BREAST BIOPSY Right 1998   neg  . BREAST BIOPSY Right 2007   neg  . BREAST BIOPSY Right 1992   positive  . BREAST EXCISIONAL BIOPSY Left 2000   neg  . CARDIAC VALVE REPLACEMENT  2014    SOCIAL HISTORY:   Social History   Tobacco Use  . Smoking status: Former Smoker    Last attempt to quit: 09/09/2007    Years since quitting: 9.8  . Smokeless tobacco: Never Used  Substance Use Topics  . Alcohol use: No    Alcohol/week: 0.0 oz    FAMILY HISTORY:   Family History  Problem Relation Age of Onset  . Cancer Mother   . Diabetes Mother   . Hypertension Mother   . Breast cancer Mother 51  . Hypertension Father   . Heart failure Father    . Cancer Brother        bladder  . Cancer Sister        breast  . Breast cancer Sister 69  . Hypertension Son   . Hypertension Daughter   . Breast cancer Sister 63    DRUG ALLERGIES:   Allergies  Allergen Reactions  . Ace Inhibitors Cough and Other (See Comments)  . Amoxicillin-Pot Clavulanate Nausea And Vomiting  . Codeine Nausea And Vomiting  . Penicillins Nausea And Vomiting and Other (See Comments)    Has patient had a PCN reaction causing immediate rash, facial/tongue/throat swelling, SOB or lightheadedness with hypotension: No Has patient had a PCN reaction causing severe rash involving mucus membranes or skin necrosis: No Has patient had a PCN reaction that required hospitalization No Has patient had a PCN reaction occurring within the last 10 years: No If all of the above answers are "NO", then may proceed with Cephalosporin use.  . Rosuvastatin Other (See Comments)    Reaction:  Joint pain   . Tetanus-Diphtheria Toxoids Td Swelling    REVIEW OF SYSTEMS:  CONSTITUTIONAL: No fever, fatigue or weakness.  Positive for weight gain. EYES: No blurred or double vision.  Has  cataracts. EARS, NOSE, AND THROAT: No tinnitus or ear pain. No sore throat RESPIRATORY: No cough.  Positive for shortness of breath with exertion.  No wheezing or hemoptysis.  CARDIOVASCULAR: No chest pain, orthopnea, edema.  GASTROINTESTINAL: No nausea, vomiting, diarrhea or abdominal pain. No blood in bowel movements GENITOURINARY: No dysuria, hematuria.  ENDOCRINE: No polyuria, nocturia,  HEMATOLOGY: No anemia, easy bruising or bleeding SKIN: No rash or lesion. MUSCULOSKELETAL: Left knee/leg pain  NEUROLOGIC: No tingling, numbness, weakness.  PSYCHIATRY: No anxiety or depression.   MEDICATIONS AT HOME:   Prior to Admission medications   Medication Sig Start Date End Date Taking? Authorizing Provider  acetaminophen (TYLENOL) 500 MG tablet Take 1,000-1,500 mg by mouth every 6 (six) hours as  needed for mild pain.    [provider]  allopurinol (ZYLOPRIM) 300 MG tablet Take 1 tablet (300 mg total) by mouth daily. 01/28/17   [name], MD  aspirin EC 81 MG tablet Take 81 mg by mouth every evening.    [provider]  COLCRYS 0.6 MG tablet TAKE 2 TABS BY MOUTH AT ONSET OF PAIN. REPEAT WITH 1 TAB IN 1 HOUR. *MAX 3TABS/24 HOURS* 05/12/16   [name], MD  conjugated estrogens (PREMARIN) vaginal cream Place 1 Applicatorful vaginally at bedtime. For two weeks after that use two times weekly Patient not taking: Reported on 05/13/2017 01/30/17   Hubbard Hartshorn, FNP  digoxin (LANOXIN) 0.125 MG tablet TAKE 1 TABLET (0.125 MG TOTAL) BY MOUTH ONCE DAILY. 12/21/15   [provider]  indomethacin (INDOCIN) 50 MG capsule Take 1 capsule (50 mg total) by mouth 2 (two) times daily with a meal. 05/13/17   Ancil Boozer, Drue [place], MD  KLOR-CON M20 20 MEQ tablet TAKE 1 TABLET BY MOUTH ONCE A DAY 04/17/17   [name], MD  loratadine (CLARITIN) 10 MG tablet Take 10 mg by mouth daily as needed for allergies.    [provider]  metoprolol tartrate (LOPRESSOR) 25 MG tablet Take 1 tablet (25 mg total) by mouth 2 (two) times daily. 01/28/17   [name], MD  SPIRIVA HANDIHALER 18 MCG inhalation capsule INHALE 1 CASPULE VIA HANDIHALER ONCE A DAY AT THE SAME TIME EVERY DAY 05/27/16   [name], MD  temazepam (RESTORIL) 15 MG capsule Take 1 capsule (15 mg total) by mouth at bedtime as needed for sleep. 04/14/17   [name], MD  torsemide (DEMADEX) 20 MG tablet Take 1 tablet (20 mg total) by mouth daily. 04/14/17   [name], MD  warfarin (COUMADIN) 5 MG tablet Take 5-7.5 mg by mouth every evening. Pt takes one tablet Sunday, Monday, Wednesday, and Friday.  Pt takes one and one-half tablet on Tuesday, Thursday, and Saturday.    [provider]   Medication reconciliation still undergoing.  VITAL SIGNS:  Blood pressure (!) 173/97, pulse 88, temperature 98  F (36.7 C), temperature source Oral, resp. rate 18, height 5\' 4"  (1.626 m), weight 77.1 kg (170 lb), SpO2 94 %.  PHYSICAL EXAMINATION:  GENERAL:  76 y.o.-year-old patient lying in the bed with no acute distress.  EYES: Pupils equal, round, reactive to light and accommodation. No scleral icterus. Extraocular muscles intact.  HEENT: Head atraumatic, normocephalic. Oropharynx and nasopharynx clear.  NECK:  Supple, no jugular venous distention. No thyroid enlargement, no tenderness.  LUNGS: Normal breath sounds bilaterally, no wheezing, rales,rhonchi or crepitation. No use of accessory muscles of respiration.  CARDIOVASCULAR: S1, S2 irregularly irregular tachycardic.Marland Kitchen No murmurs, rubs, or gallops.  ABDOMEN: Soft,  nontender, nondistended. Bowel sounds present. No organomegaly or mass.  EXTREMITIES: Trace edema.  No cyanosis, or clubbing.  Swelling around left knee.  Atrial fibrillation 112 bpm, left bundle branch block NEUROLOGIC: Cranial nerves II through XII are intact. Muscle strength 5/5 in all extremities. Sensation intact. Gait not checked.  PSYCHIATRIC: The patient is alert and oriented x 3.  SKIN: No rash, lesion, or ulcer.   LABORATORY PANEL:   CBC Recent Labs  Lab 07/31/17 1632  WBC 11.4*  HGB 12.6  HCT 38.7  PLT 208   ------------------------------------------------------------------------------------------------------------------  Chemistries  Recent Labs  Lab 07/31/17 1632  NA 141  K 3.0*  CL 102  CO2 28  GLUCOSE 125*  BUN 25*  CREATININE 1.03*  CALCIUM 9.5  AST PENDING  ALT 21  ALKPHOS 82  BILITOT 0.3   ------------------------------------------------------------------------------------------------------------------  Cardiac Enzymes No results for input(s): TROPONINI in the last 168 hours. ------------------------------------------------------------------------------------------------------------------  RADIOLOGY:  Dg Knee 2 Views Left  Result Date:  07/31/2017 CLINICAL DATA:  Fall down flight of stairs, knee pain. EXAM: LEFT KNEE - 1-2 VIEW COMPARISON:  None. FINDINGS: Displaced/comminuted fracture of the tibial plateau, centered within the lateral compartment. Associated joint effusion with fat fluid level in the suprapatellar bursa. Additional slightly displaced fracture of the fibular head. Distal femur appears intact and normally aligned. Patella appears intact and normally aligned. IMPRESSION: 1. Displaced/comminuted fracture of the proximal tibia, involving the tibial plateau, centered within the lateral compartment, probable fracture extension to the midline tibial plateau. No convincing fracture line seen within the medial portion of the tibial plateau. Most significant fracture displacement is within the lateral portion of the proximal tibia. 2. Slightly displaced fracture of the fibular head. 3. Joint effusion. Electronically Signed   By: Franki Cabot M.D.   On: 07/31/2017 14:17    EKG:   Atrial fibrillation 112 bpm, left bundle branch block  IMPRESSION AND PLAN:   1.  Left tibial plateau fracture.  INR will have to be less than 1.5 in order to do surgery.  Once I get today's INR back I will decide whether or not to give oral vitamin K.  Patient will have to be on heparin bridge prior and after surgery with history of mechanical aortic valve in the atrial fibrillation.  No contraindications to surgery at this point once INR less than 1.5. 2.  Atrial fibrillation with rapid ventricular response.  Likely secondary to pain.  Continue rate controlling medications.  Hold anticoagulation and will have to start heparin once INR less than 1.8. 3.  History of congestive heart failure.  No signs of congestive heart failure currently.  Last ejection fraction greater than 55%. 4.  History of breast cancer. 5.  History of gout.    All the records are reviewed and case discussed with ED provider. Management plans discussed with the patient,  family and they are in agreement.  CODE STATUS: Full code  TOTAL TIME TAKING CARE OF THIS PATIENT: 55 minutes.    [name] M.D on 07/31/2017 at 5:15 PM  Between 7am to 6pm - Pager - [phone]  After 6pm call admission pager [phone]  [facility]  [phone]  CC: Primary care physician; [name], MD

## 2017-07-31 NOTE — ED Provider Notes (Signed)
[facility] Emergency Department Provider Note  ____________________________________________   First MD Initiated Contact with Patient 07/31/17 1615     (approximate)  I have reviewed the triage vital signs and the nursing notes.   HISTORY  Chief Complaint Knee Injury   HPI [name] is a 76 y.o. female is self presents to the emergency department with sudden onset severe left knee pain that began suddenly when she fell down several stairs while sweeping leaves today.  Her pain is in the left knee nonradiating.  It is worse with any sort of movement and improves somewhat with rest.  She has been unable to bear weight.  She has a past medical history of CHF, COPD, atrial fibrillation, and is status post both mitral and aortic valve repair.  She does take Coumadin.  Past Medical History:  Diagnosis Date  . Atrial fibrillation ([place])   . Breast cancer ([place]) 1992   positive, radiation  . CHF (congestive heart failure) ([place])   . COPD (chronic obstructive pulmonary disease) ([place])   . History of aortic valve replacement   . Hyperlipidemia     Patient Active Problem List   Diagnosis Date Noted  . Tibial plateau fracture 07/31/2017  . Chronic systolic heart failure ([place]) 10/08/2015  . Chronic obstructive pulmonary disease ([place]) 07/04/2015  . Controlled gout 06/06/2015  . Anxiety 02/09/2015  . Synovial cyst of popliteal space 02/09/2015  . Benign hypertension 02/09/2015  . Blood type A+ 02/09/2015  . Arterial branch occlusion of retina 02/09/2015  . Chronic constipation 02/09/2015  . Insomnia, persistent 02/09/2015  . Chronic combined systolic and diastolic CHF, NYHA class 1 ([place]) 02/09/2015  . Compromised kidney function 02/09/2015  . Dyslipidemia 02/09/2015  . Atrial fibrillation ([place]) 02/09/2015  . Arthritis urica 02/09/2015  . Hearing loss 02/09/2015  . History of open heart surgery 02/09/2015  . Chronic hoarseness 02/09/2015  .  Cardiomegaly 02/09/2015  . Dysmetabolic syndrome 82/99/3716  . Migraine with aura and without status migrainosus, not intractable 02/09/2015  . Adult BMI 30+ 02/09/2015  . Hypertensive pulmonary vascular disease ([place]) 02/09/2015  . Allergic rhinitis, seasonal 02/09/2015  . Vitamin D deficiency 02/09/2015  . History of mitral valve repair 10/01/2012  . History of aortic valve replacement 10/01/2012  . Congestive heart failure ([place]) 10/01/2012    Past Surgical History:  Procedure Laterality Date  . ABDOMINAL HYSTERECTOMY  1996  . BREAST BIOPSY Right 1998   neg  . BREAST BIOPSY Right 2007   neg  . BREAST BIOPSY Right 1992   positive  . BREAST EXCISIONAL BIOPSY Left 2000   neg  . CARDIAC VALVE REPLACEMENT  2014    Prior to Admission medications   Medication Sig Start Date End Date Taking? Authorizing Provider  allopurinol (ZYLOPRIM) 300 MG tablet Take 1 tablet (300 mg total) by mouth daily. 01/28/17  Yes Sowles, Drue Stager, MD  digoxin (LANOXIN) 0.125 MG tablet TAKE 1 TABLET (0.125 MG TOTAL) BY MOUTH ONCE DAILY. 12/21/15  Yes [provider]  ezetimibe (ZETIA) 10 MG tablet Take 1 tablet by mouth daily.   Yes [provider]  KLOR-CON M20 20 MEQ tablet TAKE 1 TABLET BY MOUTH ONCE A DAY 04/17/17  Yes Sowles, Drue Stager, MD  metoprolol tartrate (LOPRESSOR) 25 MG tablet Take 1 tablet (25 mg total) by mouth 2 (two) times daily. Patient taking differently: Take 12.5 mg by mouth 2 (two) times daily.  01/28/17  Yes Sowles, Drue Stager, MD  torsemide (DEMADEX) 20 MG tablet  Take 1 tablet (20 mg total) by mouth daily. Patient taking differently: Take 20 mg by mouth 2 (two) times daily.  04/14/17  Yes Sowles, Drue Stager, MD  warfarin (COUMADIN) 5 MG tablet Take 5-7.5 mg by mouth every evening. Pt takes one tablet Sunday, Monday, Wednesday, and Friday.  Pt takes one and one-half tablet on Tuesday, Thursday, and Saturday.   Yes [provider]  acetaminophen (TYLENOL) 500 MG tablet Take  1,000-1,500 mg by mouth every 6 (six) hours as needed for mild pain.    [provider]  loratadine (CLARITIN) 10 MG tablet Take 10 mg by mouth daily as needed for allergies.    [provider]  SPIRIVA HANDIHALER 18 MCG inhalation capsule INHALE 1 CASPULE VIA HANDIHALER ONCE A DAY AT THE SAME TIME EVERY DAY 05/27/16   [name], MD    Allergies Ace inhibitors; Amoxicillin-pot clavulanate; Codeine; Penicillins; Rosuvastatin; and Tetanus-diphtheria toxoids td  Family History  Problem Relation Age of Onset  . Cancer Mother   . Diabetes Mother   . Hypertension Mother   . Breast cancer Mother 75  . Hypertension Father   . Heart failure Father   . Cancer Brother        bladder  . Cancer Sister        breast  . Breast cancer Sister 21  . Hypertension Son   . Hypertension Daughter   . Breast cancer Sister 90    Social History Social History   Tobacco Use  . Smoking status: Former Smoker    Last attempt to quit: 09/09/2007    Years since quitting: 9.9  . Smokeless tobacco: Never Used  Substance Use Topics  . Alcohol use: No    Alcohol/week: 0.0 oz  . Drug use: No    Review of Systems Constitutional: No fever/chills Eyes: No visual changes. ENT: No sore throat. Cardiovascular: Denies chest pain. Respiratory: Denies shortness of breath. Gastrointestinal: No abdominal pain.  No nausea, no vomiting.  No diarrhea.  No constipation. Genitourinary: Negative for dysuria. Musculoskeletal: Negative for back pain. Skin: Negative for rash. Neurological: Negative for headaches, focal weakness or numbness.   ____________________________________________   PHYSICAL EXAM:  VITAL SIGNS: ED Triage Vitals  Enc Vitals Group     BP 07/31/17 1331 (!) 173/97     Pulse Rate 07/31/17 1331 88     Resp 07/31/17 1331 18     Temp 07/31/17 1331 98 F (36.7 C)     Temp Source 07/31/17 1331 Oral     SpO2 07/31/17 1331 94 %     Weight 07/31/17 1332 170 lb (77.1 kg)      Height 07/31/17 1332 5\' 4"  (1.626 m)     Head Circumference --      Peak Flow --      Pain Score 07/31/17 1340 10     Pain Loc --      Pain Edu? --      Excl. in [place]? --     Constitutional: Alert and oriented x4 appears extremely uncomfortable crying in Eyes: PERRL EOMI. Head: Atraumatic. Nose: No congestion/rhinnorhea. Mouth/Throat: No trismus Neck: No stridor.   Cardiovascular: Normal rate, regular rhythm. Grossly normal heart sounds.  Good peripheral circulation. Respiratory: Normal respiratory effort.  No retractions. Lungs CTAB and moving good air Gastrointestinal: Soft nontender Musculoskeletal: Left knee swollen and extremely tender strong pulses distally normal compartments Neurologic:  Normal speech and language. No gross focal neurologic deficits are appreciated. Skin:  Skin is warm, dry  and intact. No rash noted. Psychiatric: Mood and affect are normal. Speech and behavior are normal.    ____________________________________________   DIFFERENTIAL includes but not limited to  Knee dislocation, fibular fracture, femur fracture, tibial fracture, mechanical fall ____________________________________________   LABS (all labs ordered are listed, but only abnormal results are displayed)  Labs Reviewed  COMPREHENSIVE METABOLIC PANEL - Abnormal; Notable for the following components:      Result Value   Potassium 3.0 (*)    Glucose, Bld 125 (*)    BUN 25 (*)    Creatinine, Ser 1.03 (*)    GFR calc non Af Amer 52 (*)    All other components within normal limits  CBC WITH DIFFERENTIAL/PLATELET - Abnormal; Notable for the following components:   WBC 11.4 (*)    RDW 15.3 (*)    Neutro Abs 8.8 (*)    All other components within normal limits  BASIC METABOLIC PANEL - Abnormal; Notable for the following components:   Potassium 3.3 (*)    Glucose, Bld 145 (*)    Calcium 8.8 (*)    All other components within normal limits  CBC - Abnormal; Notable for the following  components:   RBC 3.71 (*)    Hemoglobin 11.1 (*)    HCT 33.5 (*)    RDW 15.4 (*)    All other components within normal limits  PROTIME-INR - Abnormal; Notable for the following components:   Prothrombin Time 29.5 (*)    All other components within normal limits  PROTIME-INR - Abnormal; Notable for the following components:   Prothrombin Time 29.5 (*)    All other components within normal limits  DIGOXIN LEVEL - Abnormal; Notable for the following components:   Digoxin Level <0.2 (*)    All other components within normal limits  PROTIME-INR - Abnormal; Notable for the following components:   Prothrombin Time 30.1 (*)    All other components within normal limits  SURGICAL PCR SCREEN  PROTIME-INR  TYPE AND SCREEN    Blood work reviewed by me with no acute disease __________________________________________  EKG   ____________________________________________  RADIOLOGY  X-ray of the left knee reviewed by me shows tibial plateau fracture ____________________________________________   PROCEDURES  Procedure(s) performed: no  Procedures  Critical Care performed: no  Observation: no ____________________________________________   INITIAL IMPRESSION / ASSESSMENT AND PLAN / ED COURSE  Pertinent labs & imaging results that were available during my care of the patient were reviewed by me and considered in my medical decision making (see chart for details).  The patient arrives in obvious discomfort with a swollen distended left knee.  X-ray confirms significant tibial plateau fracture.  I discussed the case with on-call orthopedic surgeon Dr. [name] who will kindly consult on the case.  I then discussed the case with the hospitalist who is graciously agreed to admit the patient to their service.      ____________________________________________   FINAL CLINICAL IMPRESSION(S) / ED DIAGNOSES  Final diagnoses:  Closed fracture of left tibial plateau, initial encounter        NEW MEDICATIONS STARTED DURING THIS VISIT:  This SmartLink is deprecated. Use AVSMEDLIST instead to display the medication list for a patient.   Note:  This document was prepared using Dragon voice recognition software and may include unintentional dictation errors.     Darel Hong, MD 08/01/17 [phone]

## 2017-07-31 NOTE — ED Triage Notes (Signed)
Pt slipped on leaves while sweeping porch today and injured left knee. Pt denies dizziness prior to fall. Pt presents with swelling to left knee. Pt reports takes warfarin.

## 2017-07-31 NOTE — Progress Notes (Signed)
ANTICOAGULATION CONSULT NOTE - Initial Consult  Pharmacy Consult for Heparin, Warfarin  Indication: atrial fibrillation and mechanical heart valve  Allergies  Allergen Reactions  . Ace Inhibitors Cough and Other (See Comments)  . Amoxicillin-Pot Clavulanate Nausea And Vomiting  . Codeine Nausea And Vomiting  . Penicillins Nausea And Vomiting and Other (See Comments)    Has patient had a PCN reaction causing immediate rash, facial/tongue/throat swelling, SOB or lightheadedness with hypotension: No Has patient had a PCN reaction causing severe rash involving mucus membranes or skin necrosis: No Has patient had a PCN reaction that required hospitalization No Has patient had a PCN reaction occurring within the last 10 years: No If all of the above answers are "NO", then may proceed with Cephalosporin use.  . Rosuvastatin Other (See Comments)    Reaction:  Joint pain   . Tetanus-Diphtheria Toxoids Td Swelling    Patient Measurements: Height: 5\' 4"  (162.6 cm) Weight: 170 lb (77.1 kg) IBW/kg (Calculated) : 54.7 Heparin Dosing Weight:   Vital Signs: Temp: 98.2 F (36.8 C) (11/23 1848) Temp Source: Axillary (11/23 1848) BP: 152/68 (11/23 1848) Pulse Rate: 93 (11/23 1848)  Labs: Recent Labs    07/31/17 1632 07/31/17 1705  HGB 12.6  --   HCT 38.7  --   PLT 208  --   LABPROT  --  29.5*  INR  --  2.83  CREATININE 1.03*  --     Estimated Creatinine Clearance: 48.2 mL/min (A) (by C-G formula based on SCr of 1.03 mg/dL (H)).   Medical History: Past Medical History:  Diagnosis Date  . Atrial fibrillation ([place])   . Breast cancer ([place]) 1992   positive, radiation  . CHF (congestive heart failure) ([place])   . COPD (chronic obstructive pulmonary disease) ([place])   . History of aortic valve replacement   . Hyperlipidemia     Medications:  Medications Prior to Admission  Medication Sig Dispense Refill Last Dose  . allopurinol (ZYLOPRIM) 300 MG tablet Take 1 tablet (300 mg total)  by mouth daily. 90 tablet 1 07/30/2017 at Unknown time  . digoxin (LANOXIN) 0.125 MG tablet TAKE 1 TABLET (0.125 MG TOTAL) BY MOUTH ONCE DAILY.  6 07/30/2017 at Unknown time  . ezetimibe (ZETIA) 10 MG tablet Take 1 tablet by mouth daily.   Past Month at Unknown time  . KLOR-CON M20 20 MEQ tablet TAKE 1 TABLET BY MOUTH ONCE A DAY 90 tablet 1 07/30/2017 at Unknown time  . metoprolol tartrate (LOPRESSOR) 25 MG tablet Take 1 tablet (25 mg total) by mouth 2 (two) times daily. (Patient taking differently: Take 12.5 mg by mouth 2 (two) times daily. ) 180 tablet 1 07/30/2017 at Unknown time  . torsemide (DEMADEX) 20 MG tablet Take 1 tablet (20 mg total) by mouth daily. (Patient taking differently: Take 20 mg by mouth 2 (two) times daily. ) 90 tablet 1 07/30/2017 at Unknown time  . warfarin (COUMADIN) 5 MG tablet Take 5-7.5 mg by mouth every evening. Pt takes one tablet Sunday, Monday, Wednesday, and Friday.  Pt takes one and one-half tablet on Tuesday, Thursday, and Saturday.   07/30/2017 at Unknown time  . acetaminophen (TYLENOL) 500 MG tablet Take 1,000-1,500 mg by mouth every 6 (six) hours as needed for mild pain.   prn at prn  . loratadine (CLARITIN) 10 MG tablet Take 10 mg by mouth daily as needed for allergies.   prn at prn  . SPIRIVA HANDIHALER 18 MCG inhalation capsule INHALE 1 CASPULE VIA  HANDIHALER ONCE A DAY AT THE SAME TIME EVERY DAY 90 capsule 3 prn at prn    Assessment: Pt admits with fractured tibia but was on warfarin PTA,  INR on 11/23 = 2.83.   INR will need to be < 1.5 for orthopedic surgery. Pt received Phytonadione 5 mg PO X 1 on 11/23 @ 19:00.   Per Dr Leslye Peer ,  Begin heparin gtt on 11/24 if INR is less than 1.9 .  Goal of Therapy:  INR < 1.5,  HL = 0.3 - 0.7 Monitor platelets by anticoagulation protocol: Yes   Plan:  Will check INR on 11/24 with AM labs and begin heparin gtt if INR is less than 1.9.   Delontae Lamm D 07/31/2017,6:58 PM

## 2017-08-01 DIAGNOSIS — J42 Unspecified chronic bronchitis: Secondary | ICD-10-CM | POA: Diagnosis not present

## 2017-08-01 DIAGNOSIS — S82142A Displaced bicondylar fracture of left tibia, initial encounter for closed fracture: Secondary | ICD-10-CM | POA: Diagnosis not present

## 2017-08-01 DIAGNOSIS — S82402A Unspecified fracture of shaft of left fibula, initial encounter for closed fracture: Secondary | ICD-10-CM | POA: Diagnosis not present

## 2017-08-01 DIAGNOSIS — Z7401 Bed confinement status: Secondary | ICD-10-CM | POA: Diagnosis not present

## 2017-08-01 DIAGNOSIS — Z7982 Long term (current) use of aspirin: Secondary | ICD-10-CM | POA: Diagnosis not present

## 2017-08-01 DIAGNOSIS — I517 Cardiomegaly: Secondary | ICD-10-CM | POA: Diagnosis not present

## 2017-08-01 DIAGNOSIS — I429 Cardiomyopathy, unspecified: Secondary | ICD-10-CM | POA: Diagnosis present

## 2017-08-01 DIAGNOSIS — E7800 Pure hypercholesterolemia, unspecified: Secondary | ICD-10-CM | POA: Diagnosis present

## 2017-08-01 DIAGNOSIS — S82102A Unspecified fracture of upper end of left tibia, initial encounter for closed fracture: Secondary | ICD-10-CM | POA: Diagnosis not present

## 2017-08-01 DIAGNOSIS — S82832A Other fracture of upper and lower end of left fibula, initial encounter for closed fracture: Secondary | ICD-10-CM | POA: Diagnosis not present

## 2017-08-01 DIAGNOSIS — I5022 Chronic systolic (congestive) heart failure: Secondary | ICD-10-CM | POA: Diagnosis not present

## 2017-08-01 DIAGNOSIS — I272 Pulmonary hypertension, unspecified: Secondary | ICD-10-CM | POA: Diagnosis present

## 2017-08-01 DIAGNOSIS — J9811 Atelectasis: Secondary | ICD-10-CM | POA: Diagnosis not present

## 2017-08-01 DIAGNOSIS — J410 Simple chronic bronchitis: Secondary | ICD-10-CM | POA: Diagnosis not present

## 2017-08-01 DIAGNOSIS — Y92009 Unspecified place in unspecified non-institutional (private) residence as the place of occurrence of the external cause: Secondary | ICD-10-CM | POA: Diagnosis not present

## 2017-08-01 DIAGNOSIS — Z853 Personal history of malignant neoplasm of breast: Secondary | ICD-10-CM | POA: Diagnosis not present

## 2017-08-01 DIAGNOSIS — M6281 Muscle weakness (generalized): Secondary | ICD-10-CM | POA: Diagnosis not present

## 2017-08-01 DIAGNOSIS — E785 Hyperlipidemia, unspecified: Secondary | ICD-10-CM | POA: Diagnosis not present

## 2017-08-01 DIAGNOSIS — Z923 Personal history of irradiation: Secondary | ICD-10-CM | POA: Diagnosis not present

## 2017-08-01 DIAGNOSIS — F419 Anxiety disorder, unspecified: Secondary | ICD-10-CM | POA: Diagnosis present

## 2017-08-01 DIAGNOSIS — I481 Persistent atrial fibrillation: Secondary | ICD-10-CM | POA: Diagnosis not present

## 2017-08-01 DIAGNOSIS — I110 Hypertensive heart disease with heart failure: Secondary | ICD-10-CM | POA: Diagnosis present

## 2017-08-01 DIAGNOSIS — I350 Nonrheumatic aortic (valve) stenosis: Secondary | ICD-10-CM | POA: Diagnosis not present

## 2017-08-01 DIAGNOSIS — I480 Paroxysmal atrial fibrillation: Secondary | ICD-10-CM | POA: Diagnosis not present

## 2017-08-01 DIAGNOSIS — I5042 Chronic combined systolic (congestive) and diastolic (congestive) heart failure: Secondary | ICD-10-CM | POA: Diagnosis present

## 2017-08-01 DIAGNOSIS — Z7901 Long term (current) use of anticoagulants: Secondary | ICD-10-CM | POA: Diagnosis not present

## 2017-08-01 DIAGNOSIS — W19XXXA Unspecified fall, initial encounter: Secondary | ICD-10-CM | POA: Diagnosis not present

## 2017-08-01 DIAGNOSIS — J449 Chronic obstructive pulmonary disease, unspecified: Secondary | ICD-10-CM | POA: Diagnosis present

## 2017-08-01 DIAGNOSIS — Z90710 Acquired absence of both cervix and uterus: Secondary | ICD-10-CM | POA: Diagnosis not present

## 2017-08-01 DIAGNOSIS — W19XXXD Unspecified fall, subsequent encounter: Secondary | ICD-10-CM | POA: Diagnosis not present

## 2017-08-01 DIAGNOSIS — S82102D Unspecified fracture of upper end of left tibia, subsequent encounter for closed fracture with routine healing: Secondary | ICD-10-CM | POA: Diagnosis not present

## 2017-08-01 DIAGNOSIS — G4700 Insomnia, unspecified: Secondary | ICD-10-CM | POA: Diagnosis present

## 2017-08-01 DIAGNOSIS — I4891 Unspecified atrial fibrillation: Secondary | ICD-10-CM | POA: Diagnosis not present

## 2017-08-01 DIAGNOSIS — Y33XXXA Other specified events, undetermined intent, initial encounter: Secondary | ICD-10-CM | POA: Diagnosis not present

## 2017-08-01 DIAGNOSIS — Z952 Presence of prosthetic heart valve: Secondary | ICD-10-CM | POA: Diagnosis not present

## 2017-08-01 DIAGNOSIS — S82192A Other fracture of upper end of left tibia, initial encounter for closed fracture: Secondary | ICD-10-CM | POA: Diagnosis not present

## 2017-08-01 LAB — CBC
HEMATOCRIT: 33.5 % — AB (ref 35.0–47.0)
HEMOGLOBIN: 11.1 g/dL — AB (ref 12.0–16.0)
MCH: 30 pg (ref 26.0–34.0)
MCHC: 33.2 g/dL (ref 32.0–36.0)
MCV: 90.3 fL (ref 80.0–100.0)
Platelets: 176 10*3/uL (ref 150–440)
RBC: 3.71 MIL/uL — AB (ref 3.80–5.20)
RDW: 15.4 % — ABNORMAL HIGH (ref 11.5–14.5)
WBC: 9.1 10*3/uL (ref 3.6–11.0)

## 2017-08-01 LAB — BASIC METABOLIC PANEL
ANION GAP: 8 (ref 5–15)
BUN: 19 mg/dL (ref 6–20)
CHLORIDE: 102 mmol/L (ref 101–111)
CO2: 28 mmol/L (ref 22–32)
Calcium: 8.8 mg/dL — ABNORMAL LOW (ref 8.9–10.3)
Creatinine, Ser: 0.74 mg/dL (ref 0.44–1.00)
Glucose, Bld: 145 mg/dL — ABNORMAL HIGH (ref 65–99)
Potassium: 3.3 mmol/L — ABNORMAL LOW (ref 3.5–5.1)
Sodium: 138 mmol/L (ref 135–145)

## 2017-08-01 LAB — PROTIME-INR
INR: 2.83
INR: 2.9
PROTHROMBIN TIME: 29.5 s — AB (ref 11.4–15.2)
PROTHROMBIN TIME: 30.1 s — AB (ref 11.4–15.2)

## 2017-08-01 MED ORDER — VITAMIN K1 10 MG/ML IJ SOLN
2.5000 mg | Freq: Once | INTRAVENOUS | Status: AC
  Administered 2017-08-01: 2.5 mg via INTRAVENOUS
  Filled 2017-08-01: qty 0.25

## 2017-08-01 NOTE — Progress Notes (Signed)
Report given to [facility] 

## 2017-08-01 NOTE — Discharge Summary (Signed)
[name] NAME: [name]    MR#:  [id number]  DATE OF BIRTH:  February 24, 1941  DATE OF ADMISSION:  07/31/2017 ADMITTING PHYSICIAN: [name], MD  DATE OF transfer to [place] [facility] when bed available  PRIMARY CARE PHYSICIAN: [name], MD    ADMISSION DIAGNOSIS:  Closed fracture of left tibial plateau, initial encounter [S82.142A]  DISCHARGE DIAGNOSIS:  Closed fracture of the left tibial plateau  therapeutic INR secondary to patient being on Coumadin--- Coumadin on hold for anticipated surgery  SECONDARY DIAGNOSIS:   Past Medical History:  Diagnosis Date  . Atrial fibrillation ([place])   . Breast cancer ([place]) 1992   positive, radiation  . CHF (congestive heart failure) ([place])   . COPD (chronic obstructive pulmonary disease) ([place])   . History of aortic valve replacement   . Hyperlipidemia     HOSPITAL COURSE:   NancyJeffriesis a56 y.o.femalewas out on her back porch sweeping some leaves and she stepped back and fell down 4 steps she landed on her face and leg. Patient could not lift her left leg.   1. Left tibial plateau fracture.  -INR will have to be less than 1.5 in order to do surgery.  -pt recieved oral vitamin K 5 mg at 6 pm last evening - Patient will have to be on heparin bridge prior and after surgerywith history of mechanical aortic valve in the atrial fibrillation. No contraindications to surgery at this point once INR less than 1.5. -INR 2.88--vitamin K 5 mg oral --- 2.83 --- vitamin K 2.5 mg IV--- pending INR -Patient will need to be bridged on heparin drip as soon as INR drops less than 2. -Patient was seen by Dr. [name] orthopedic.  Patient is preferring to get surgery done at [facility] requesting transfer.  Dr. [name] called [facility] hospitalist internal medicine.  Patient is put on the transfer wait list.  Accepting physician [name] hospitalist [facility]  2. Atrial fibrillation with rapid  ventricular response. Likely secondary to pain. Continue rate controlling medications. Hold anticoagulation and will have to start heparin once INR less than 2.0.  3. History of congestive heart failure. No signs of congestive heart failure currently. Last ejection fraction greater than 55%.  4. History of breast cancer.  5. History of gout.  Transferred to [facility] once bed available. CONSULTS OBTAINED:    DRUG ALLERGIES:   Allergies  Allergen Reactions  . Ace Inhibitors Cough and Other (See Comments)  . Amoxicillin-Pot Clavulanate Nausea And Vomiting  . Codeine Nausea And Vomiting  . Penicillins Nausea And Vomiting and Other (See Comments)    Has patient had a PCN reaction causing immediate rash, facial/tongue/throat swelling, SOB or lightheadedness with hypotension: No Has patient had a PCN reaction causing severe rash involving mucus membranes or skin necrosis: No Has patient had a PCN reaction that required hospitalization No Has patient had a PCN reaction occurring within the last 10 years: No If all of the above answers are "NO", then may proceed with Cephalosporin use.  . Rosuvastatin Other (See Comments)    Reaction:  Joint pain   . Tetanus-Diphtheria Toxoids Td Swelling    DISCHARGE MEDICATIONS:   Current Discharge Medication List    CONTINUE these medications which have NOT CHANGED   Details  allopurinol (ZYLOPRIM) 300 MG tablet Take 1 tablet (300 mg total) by mouth daily. Qty: 90 tablet, Refills: 1   Associated Diagnoses: Controlled gout    digoxin (LANOXIN) 0.125 MG  tablet TAKE 1 TABLET (0.125 MG TOTAL) BY MOUTH ONCE DAILY. Refills: 6    ezetimibe (ZETIA) 10 MG tablet Take 1 tablet by mouth daily.    KLOR-CON M20 20 MEQ tablet TAKE 1 TABLET BY MOUTH ONCE A DAY Qty: 90 tablet, Refills: 1    metoprolol tartrate (LOPRESSOR) 25 MG tablet Take 1 tablet (25 mg total) by mouth 2 (two) times daily. Qty: 180 tablet, Refills: 1   Associated  Diagnoses: Paroxysmal atrial fibrillation (HCC)    torsemide (DEMADEX) 20 MG tablet Take 1 tablet (20 mg total) by mouth daily. Qty: 90 tablet, Refills: 1   Associated Diagnoses: Benign hypertension; Chronic combined systolic and diastolic CHF, NYHA class 1 (HCC)    acetaminophen (TYLENOL) 500 MG tablet Take 1,000-1,500 mg by mouth every 6 (six) hours as needed for mild pain.    loratadine (CLARITIN) 10 MG tablet Take 10 mg by mouth daily as needed for allergies.    SPIRIVA HANDIHALER 18 MCG inhalation capsule INHALE 1 CASPULE VIA HANDIHALER ONCE A DAY AT THE SAME TIME EVERY DAY Qty: 90 capsule, Refills: 3   Associated Diagnoses: Chronic obstructive pulmonary disease with acute lower respiratory infection ([place])      STOP taking these medications     warfarin (COUMADIN) 5 MG tablet         If you experience worsening of your admission symptoms, develop shortness of breath, life threatening emergency, suicidal or homicidal thoughts you must seek medical attention immediately by calling 911 or calling your MD immediately  if symptoms less severe.  You Must read complete instructions/literature along with all the possible adverse reactions/side effects for all the Medicines you take and that have been prescribed to you. Take any new Medicines after you have completely understood and accept all the possible adverse reactions/side effects.   Please note  You were cared for by a hospitalist during your hospital stay. If you have any questions about your discharge medications or the care you received while you were in the hospital after you are discharged, you can call the unit and asked to speak with the hospitalist on call if the hospitalist that took care of you is not available. Once you are discharged, your primary care physician will handle any further medical issues. Please note that NO REFILLS for any discharge medications will be authorized once you are discharged, as it is imperative  that you return to your primary care physician (or establish a relationship with a primary care physician if you do not have one) for your aftercare needs so that they can reassess your need for medications and monitor your lab values.   DATA REVIEW:   CBC  Recent Labs  Lab 08/01/17 0335  WBC 9.1  HGB 11.1*  HCT 33.5*  PLT 176    Chemistries  Recent Labs  Lab 07/31/17 1632 08/01/17 0335  NA 141 138  K 3.0* 3.3*  CL 102 102  CO2 28 28  GLUCOSE 125* 145*  BUN 25* 19  CREATININE 1.03* 0.74  CALCIUM 9.5 8.8*  AST 32  --   ALT 21  --   ALKPHOS 82  --   BILITOT 0.3  --     Microbiology Results   Recent Results (from the past 240 hour(s))  Surgical PCR screen     Status: None   Collection Time: 07/31/17  7:51 PM  Result Value Ref Range Status   MRSA, PCR NEGATIVE NEGATIVE Final   Staphylococcus aureus NEGATIVE NEGATIVE Final  Comment: (NOTE) The Xpert SA Assay (FDA approved for NASAL specimens in patients 21 years of age and older), is one component of a comprehensive surveillance program. It is not intended to diagnose infection nor to guide or monitor treatment.     RADIOLOGY:  Dg Knee 2 Views Left  Result Date: 07/31/2017 CLINICAL DATA:  Fall down flight of stairs, knee pain. EXAM: LEFT KNEE - 1-2 VIEW COMPARISON:  None. FINDINGS: Displaced/comminuted fracture of the tibial plateau, centered within the lateral compartment. Associated joint effusion with fat fluid level in the suprapatellar bursa. Additional slightly displaced fracture of the fibular head. Distal femur appears intact and normally aligned. Patella appears intact and normally aligned. IMPRESSION: 1. Displaced/comminuted fracture of the proximal tibia, involving the tibial plateau, centered within the lateral compartment, probable fracture extension to the midline tibial plateau. No convincing fracture line seen within the medial portion of the tibial plateau. Most significant fracture displacement is  within the lateral portion of the proximal tibia. 2. Slightly displaced fracture of the fibular head. 3. Joint effusion. Electronically Signed   By: Franki Cabot M.D.   On: 07/31/2017 14:17   Ct Head Wo Contrast  Result Date: 07/31/2017 CLINICAL DATA:  Status post fall today with a blow to the left side of the head. EXAM: CT HEAD WITHOUT CONTRAST TECHNIQUE: Contiguous axial images were obtained from the base of the skull through the vertex without intravenous contrast. COMPARISON:  Brain MRI 01/15/2012.  Head CT 05/29/2009. FINDINGS: Brain: Appears normal without hemorrhage, infarct, mass lesion, mass effect, midline shift or abnormal extra-axial fluid collection. No hydrocephalus or pneumocephalus. Vascular: Atherosclerosis noted. Skull: Intact. Sinuses/Orbits: Negative. Other: None. IMPRESSION: No acute abnormality. Atherosclerosis. Electronically Signed   By: Inge Rise M.D.   On: 07/31/2017 17:28     Management plans discussed with the patient, family and they are in agreement.  CODE STATUS:     Code Status Orders  (From admission, onward)        Start     Ordered   07/31/17 1703  Full code  Continuous     07/31/17 1703    Code Status History    Date Active Date Inactive Code Status Order ID Comments User Context   This patient has a current code status but no historical code status.    Advance Directive Documentation     Most Recent Value  Type of Advance Directive  Healthcare Power of Attorney  Pre-existing out of facility DNR order (yellow form or pink MOST form)  No data  "MOST" Form in Place?  No data      TOTAL TIME TAKING CARE OF THIS PATIENT: *40 minutes.    Fritzi Mandes M.D on 08/01/2017 at 1:05 PM  Between 7am to 6pm - Pager - [phone] After 6pm go to [email or web address] - password [email or web address] [facility]  Office  [phone]  CC: Primary care physician; [name], MD

## 2017-08-01 NOTE — Consult Note (Signed)
ORTHOPAEDIC CONSULTATION  REQUESTING PHYSICIAN: Fritzi Mandes, MD  Chief Complaint: left knee pain  HPI: [name] is a 76 y.o. female who complains of left knee pain after mechanical fall at home. Please see ED and hospitalist note for details. Notes, labs, x-rays reviewed. She has no other complaints.  Past Medical History:  Diagnosis Date  . Atrial fibrillation ([place])   . Breast cancer ([place]) 1992   positive, radiation  . CHF (congestive heart failure) ([place])   . COPD (chronic obstructive pulmonary disease) ([place])   . History of aortic valve replacement   . Hyperlipidemia    Past Surgical History:  Procedure Laterality Date  . ABDOMINAL HYSTERECTOMY  1996  . BREAST BIOPSY Right 1998   neg  . BREAST BIOPSY Right 2007   neg  . BREAST BIOPSY Right 1992   positive  . BREAST EXCISIONAL BIOPSY Left 2000   neg  . CARDIAC VALVE REPLACEMENT  2014   Social History   Socioeconomic History  . Marital status: Widowed    Spouse name: None  . Number of children: 2  . Years of education: None  . Highest education level: None  Social Needs  . Financial resource strain: None  . Food insecurity - worry: None  . Food insecurity - inability: None  . Transportation needs - medical: None  . Transportation needs - non-medical: None  Occupational History  . Occupation: Custodian  Tobacco Use  . Smoking status: Former Smoker    Last attempt to quit: 09/09/2007    Years since quitting: 9.9  . Smokeless tobacco: Never Used  Substance and Sexual Activity  . Alcohol use: No    Alcohol/week: 0.0 oz  . Drug use: No  . Sexual activity: Not Currently  Other Topics Concern  . None  Social History Narrative  . None   Family History  Problem Relation Age of Onset  . Cancer Mother   . Diabetes Mother   . Hypertension Mother   . Breast cancer Mother 6  . Hypertension Father   . Heart failure Father   . Cancer Brother        bladder  . Cancer Sister        breast  . Breast  cancer Sister 58  . Hypertension Son   . Hypertension Daughter   . Breast cancer Sister 39   Allergies  Allergen Reactions  . Ace Inhibitors Cough and Other (See Comments)  . Amoxicillin-Pot Clavulanate Nausea And Vomiting  . Codeine Nausea And Vomiting  . Penicillins Nausea And Vomiting and Other (See Comments)    Has patient had a PCN reaction causing immediate rash, facial/tongue/throat swelling, SOB or lightheadedness with hypotension: No Has patient had a PCN reaction causing severe rash involving mucus membranes or skin necrosis: No Has patient had a PCN reaction that required hospitalization No Has patient had a PCN reaction occurring within the last 10 years: No If all of the above answers are "NO", then may proceed with Cephalosporin use.  . Rosuvastatin Other (See Comments)    Reaction:  Joint pain   . Tetanus-Diphtheria Toxoids Td Swelling   Prior to Admission medications   Medication Sig Start Date End Date Taking? Authorizing Provider  allopurinol (ZYLOPRIM) 300 MG tablet Take 1 tablet (300 mg total) by mouth daily. 01/28/17  Yes Sowles, Drue Stager, MD  digoxin (LANOXIN) 0.125 MG tablet TAKE 1 TABLET (0.125 MG TOTAL) BY MOUTH ONCE DAILY. 12/21/15  Yes [provider]  ezetimibe (ZETIA) 10 MG  tablet Take 1 tablet by mouth daily.   Yes [provider]  KLOR-CON M20 20 MEQ tablet TAKE 1 TABLET BY MOUTH ONCE A DAY 04/17/17  Yes Sowles, Drue Stager, MD  metoprolol tartrate (LOPRESSOR) 25 MG tablet Take 1 tablet (25 mg total) by mouth 2 (two) times daily. Patient taking differently: Take 12.5 mg by mouth 2 (two) times daily.  01/28/17  Yes Sowles, Drue Stager, MD  torsemide (DEMADEX) 20 MG tablet Take 1 tablet (20 mg total) by mouth daily. Patient taking differently: Take 20 mg by mouth 2 (two) times daily.  04/14/17  Yes Sowles, Drue Stager, MD  warfarin (COUMADIN) 5 MG tablet Take 5-7.5 mg by mouth every evening. Pt takes one tablet Sunday, Monday, Wednesday, and Friday.  Pt  takes one and one-half tablet on Tuesday, Thursday, and Saturday.   Yes [provider]  acetaminophen (TYLENOL) 500 MG tablet Take 1,000-1,500 mg by mouth every 6 (six) hours as needed for mild pain.    [provider]  loratadine (CLARITIN) 10 MG tablet Take 10 mg by mouth daily as needed for allergies.    [provider]  SPIRIVA HANDIHALER 18 MCG inhalation capsule INHALE 1 CASPULE VIA HANDIHALER ONCE A DAY AT THE SAME TIME EVERY DAY 05/27/16   [name], MD   Dg Knee 2 Views Left  Result Date: 07/31/2017 CLINICAL DATA:  Fall down flight of stairs, knee pain. EXAM: LEFT KNEE - 1-2 VIEW COMPARISON:  None. FINDINGS: Displaced/comminuted fracture of the tibial plateau, centered within the lateral compartment. Associated joint effusion with fat fluid level in the suprapatellar bursa. Additional slightly displaced fracture of the fibular head. Distal femur appears intact and normally aligned. Patella appears intact and normally aligned. IMPRESSION: 1. Displaced/comminuted fracture of the proximal tibia, involving the tibial plateau, centered within the lateral compartment, probable fracture extension to the midline tibial plateau. No convincing fracture line seen within the medial portion of the tibial plateau. Most significant fracture displacement is within the lateral portion of the proximal tibia. 2. Slightly displaced fracture of the fibular head. 3. Joint effusion. Electronically Signed   By: Franki Cabot M.D.   On: 07/31/2017 14:17   Ct Head Wo Contrast  Result Date: 07/31/2017 CLINICAL DATA:  Status post fall today with a blow to the left side of the head. EXAM: CT HEAD WITHOUT CONTRAST TECHNIQUE: Contiguous axial images were obtained from the base of the skull through the vertex without intravenous contrast. COMPARISON:  Brain MRI 01/15/2012.  Head CT 05/29/2009. FINDINGS: Brain: Appears normal without hemorrhage, infarct, mass lesion, mass effect, midline shift  or abnormal extra-axial fluid collection. No hydrocephalus or pneumocephalus. Vascular: Atherosclerosis noted. Skull: Intact. Sinuses/Orbits: Negative. Other: None. IMPRESSION: No acute abnormality. Atherosclerosis. Electronically Signed   By: Inge Rise M.D.   On: 07/31/2017 17:28    Positive ROS: All other systems have been reviewed and were otherwise negative with the exception of those mentioned in the HPI and as above.  Physical Exam: General: Alert, no acute distress Cardiovascular: No pedal edema Respiratory: No cyanosis, no use of accessory musculature GI: No organomegaly, abdomen is soft and non-tender Skin: No lesions in the area of chief complaint Neurologic: Sensation intact distally Psychiatric: Patient is competent for consent with normal mood and affect Lymphatic: No axillary or cervical lymphadenopathy  MUSCULOSKELETAL: RLE FROM, non-tender, BUE FROM and non-tender LLE: compartments soft, sensation light touch intact 1st web space, DF/PF/EHL intact Large knee effusion, diffusely tender  Assessment: Closed fracture of left tibial plateau,  initial encounter   Plan: Patient with a displaced plateau fracture and multiple medical co-morbidities. Appreciate hospitalist care. Patient is now requesting transfer to [facility] for management. I will arrange transfer today if possible. All of her questions are answered. CT scan is cancelled and surgery for tomorrow is cancelled.    Lovell Sheehan, MD    08/01/2017 8:33 AM

## 2017-08-01 NOTE — Progress Notes (Signed)
ANTICOAGULATION CONSULT NOTE - Initial Consult  Pharmacy Consult for Heparin, Warfarin  Indication: atrial fibrillation and mechanical heart valve  Allergies  Allergen Reactions  . Ace Inhibitors Cough and Other (See Comments)  . Amoxicillin-Pot Clavulanate Nausea And Vomiting  . Codeine Nausea And Vomiting  . Penicillins Nausea And Vomiting and Other (See Comments)    Has patient had a PCN reaction causing immediate rash, facial/tongue/throat swelling, SOB or lightheadedness with hypotension: No Has patient had a PCN reaction causing severe rash involving mucus membranes or skin necrosis: No Has patient had a PCN reaction that required hospitalization No Has patient had a PCN reaction occurring within the last 10 years: No If all of the above answers are "NO", then may proceed with Cephalosporin use.  . Rosuvastatin Other (See Comments)    Reaction:  Joint pain   . Tetanus-Diphtheria Toxoids Td Swelling    Patient Measurements: Height: 5\' 4"  (162.6 cm) Weight: 170 lb (77.1 kg) IBW/kg (Calculated) : 54.7 Heparin Dosing Weight:   Vital Signs: Temp: 98 F (36.7 C) (11/24 0028) Temp Source: Oral (11/24 0028) BP: 118/77 (11/24 0028) Pulse Rate: 79 (11/24 0028)  Labs: Recent Labs    07/31/17 1632 07/31/17 1705 08/01/17 0335 08/01/17 0645  HGB 12.6  --  11.1*  --   HCT 38.7  --  33.5*  --   PLT 208  --  176  --   LABPROT  --  29.5* 29.5* 30.1*  INR  --  2.83 2.83 2.90  CREATININE 1.03*  --  0.74  --     Estimated Creatinine Clearance: 62 mL/min (by C-G formula based on SCr of 0.74 mg/dL).   Medical History: Past Medical History:  Diagnosis Date  . Atrial fibrillation ([place])   . Breast cancer ([place]) 1992   positive, radiation  . CHF (congestive heart failure) ([place])   . COPD (chronic obstructive pulmonary disease) ([place])   . History of aortic valve replacement   . Hyperlipidemia     Medications:  Medications Prior to Admission  Medication Sig Dispense Refill  Last Dose  . allopurinol (ZYLOPRIM) 300 MG tablet Take 1 tablet (300 mg total) by mouth daily. 90 tablet 1 07/30/2017 at Unknown time  . digoxin (LANOXIN) 0.125 MG tablet TAKE 1 TABLET (0.125 MG TOTAL) BY MOUTH ONCE DAILY.  6 07/30/2017 at Unknown time  . ezetimibe (ZETIA) 10 MG tablet Take 1 tablet by mouth daily.   Past Month at Unknown time  . KLOR-CON M20 20 MEQ tablet TAKE 1 TABLET BY MOUTH ONCE A DAY 90 tablet 1 07/30/2017 at Unknown time  . metoprolol tartrate (LOPRESSOR) 25 MG tablet Take 1 tablet (25 mg total) by mouth 2 (two) times daily. (Patient taking differently: Take 12.5 mg by mouth 2 (two) times daily. ) 180 tablet 1 07/30/2017 at Unknown time  . torsemide (DEMADEX) 20 MG tablet Take 1 tablet (20 mg total) by mouth daily. (Patient taking differently: Take 20 mg by mouth 2 (two) times daily. ) 90 tablet 1 07/30/2017 at Unknown time  . warfarin (COUMADIN) 5 MG tablet Take 5-7.5 mg by mouth every evening. Pt takes one tablet Sunday, Monday, Wednesday, and Friday.  Pt takes one and one-half tablet on Tuesday, Thursday, and Saturday.   07/30/2017 at Unknown time  . acetaminophen (TYLENOL) 500 MG tablet Take 1,000-1,500 mg by mouth every 6 (six) hours as needed for mild pain.   prn at prn  . loratadine (CLARITIN) 10 MG tablet Take 10 mg by mouth  daily as needed for allergies.   prn at prn  . SPIRIVA HANDIHALER 18 MCG inhalation capsule INHALE 1 CASPULE VIA HANDIHALER ONCE A DAY AT THE SAME TIME EVERY DAY 90 capsule 3 prn at prn    Assessment: Pt admits with fractured tibia but was on warfarin PTA,  INR on 11/23 = 2.9.   INR will need to be < 1.5 for orthopedic surgery. Pt received Phytonadione 5 mg PO X 1 on 11/23 @ 19:00.   Per MD [place] ,  Begin heparin gtt on 11/25 if INR is less than 1.9 .  Goal of Therapy:  INR < 1.5,  HL = 0.3 - 0.7 Monitor platelets by anticoagulation protocol: Yes   Plan:  Will check INR on 11/25 with AM labs and begin heparin gtt if INR is less than 1.9.    Mattalynn Crandle D 08/01/2017,7:27 AM

## 2017-08-01 NOTE — Progress Notes (Signed)
[facility] [place] NAME: [name]    MR#:  [id number]  DATE OF BIRTH:  1941-05-26  SUBJECTIVE:  Came in after pt fell while sweeping leaves at home...accidental fall. C/o leg pain  REVIEW OF SYSTEMS:   Review of Systems  Constitutional: Negative for chills, fever and weight loss.  HENT: Negative for ear discharge, ear pain and nosebleeds.   Eyes: Negative for blurred vision, pain and discharge.  Respiratory: Negative for sputum production, shortness of breath, wheezing and stridor.   Cardiovascular: Negative for chest pain, palpitations, orthopnea and PND.  Gastrointestinal: Negative for abdominal pain, diarrhea, nausea and vomiting.  Genitourinary: Negative for frequency and urgency.  Musculoskeletal: Positive for joint pain. Negative for back pain.  Neurological: Positive for weakness. Negative for sensory change, speech change and focal weakness.  Psychiatric/Behavioral: Negative for depression and hallucinations. The patient is not nervous/anxious.    Tolerating Diet:yes Tolerating PT: pedning  DRUG ALLERGIES:   Allergies  Allergen Reactions  . Ace Inhibitors Cough and Other (See Comments)  . Amoxicillin-Pot Clavulanate Nausea And Vomiting  . Codeine Nausea And Vomiting  . Penicillins Nausea And Vomiting and Other (See Comments)    Has patient had a PCN reaction causing immediate rash, facial/tongue/throat swelling, SOB or lightheadedness with hypotension: No Has patient had a PCN reaction causing severe rash involving mucus membranes or skin necrosis: No Has patient had a PCN reaction that required hospitalization No Has patient had a PCN reaction occurring within the last 10 years: No If all of the above answers are "NO", then may proceed with Cephalosporin use.  . Rosuvastatin Other (See Comments)    Reaction:  Joint pain   . Tetanus-Diphtheria Toxoids Td Swelling    VITALS:  Blood pressure 118/77, pulse 79,  temperature 98 F (36.7 C), temperature source Oral, resp. rate 18, height 5\' 4"  (1.626 m), weight 77.1 kg (170 lb), SpO2 92 %.  PHYSICAL EXAMINATION:   Physical Exam  GENERAL:  76 y.o.-year-old patient lying in the bed with no acute distress.  EYES: Pupils equal, round, reactive to light and accommodation. No scleral icterus. Extraocular muscles intact.  HEENT: Head atraumatic, normocephalic. Oropharynx and nasopharynx clear.  NECK:  Supple, no jugular venous distention. No thyroid enlargement, no tenderness.  LUNGS: Normal breath sounds bilaterally, no wheezing, rales, rhonchi. No use of accessory muscles of respiration.  CARDIOVASCULAR: S1, S2 normal. No murmurs, rubs, or gallops.  ABDOMEN: Soft, nontender, nondistended. Bowel sounds present. No organomegaly or mass.  EXTREMITIES: No cyanosis, clubbing or edema b/l.   Left LE decreased ROM due to pain NEUROLOGIC: Cranial nerves II through XII are intact. No focal Motor or sensory deficits b/l.   PSYCHIATRIC:  patient is alert and oriented x 3.  SKIN: No obvious rash, lesion, or ulcer.   LABORATORY PANEL:  CBC Recent Labs  Lab 08/01/17 0335  WBC 9.1  HGB 11.1*  HCT 33.5*  PLT 176    Chemistries  Recent Labs  Lab 07/31/17 1632 08/01/17 0335  NA 141 138  K 3.0* 3.3*  CL 102 102  CO2 28 28  GLUCOSE 125* 145*  BUN 25* 19  CREATININE 1.03* 0.74  CALCIUM 9.5 8.8*  AST 32  --   ALT 21  --   ALKPHOS 82  --   BILITOT 0.3  --    Cardiac Enzymes No results for input(s): TROPONINI in the last 168 hours. RADIOLOGY:  Dg Knee 2 Views Left  Result  Date: 07/31/2017 CLINICAL DATA:  Fall down flight of stairs, knee pain. EXAM: LEFT KNEE - 1-2 VIEW COMPARISON:  None. FINDINGS: Displaced/comminuted fracture of the tibial plateau, centered within the lateral compartment. Associated joint effusion with fat fluid level in the suprapatellar bursa. Additional slightly displaced fracture of the fibular head. Distal femur appears intact  and normally aligned. Patella appears intact and normally aligned. IMPRESSION: 1. Displaced/comminuted fracture of the proximal tibia, involving the tibial plateau, centered within the lateral compartment, probable fracture extension to the midline tibial plateau. No convincing fracture line seen within the medial portion of the tibial plateau. Most significant fracture displacement is within the lateral portion of the proximal tibia. 2. Slightly displaced fracture of the fibular head. 3. Joint effusion. Electronically Signed   By: Franki Cabot M.D.   On: 07/31/2017 14:17   Ct Head Wo Contrast  Result Date: 07/31/2017 CLINICAL DATA:  Status post fall today with a blow to the left side of the head. EXAM: CT HEAD WITHOUT CONTRAST TECHNIQUE: Contiguous axial images were obtained from the base of the skull through the vertex without intravenous contrast. COMPARISON:  Brain MRI 01/15/2012.  Head CT 05/29/2009. FINDINGS: Brain: Appears normal without hemorrhage, infarct, mass lesion, mass effect, midline shift or abnormal extra-axial fluid collection. No hydrocephalus or pneumocephalus. Vascular: Atherosclerosis noted. Skull: Intact. Sinuses/Orbits: Negative. Other: None. IMPRESSION: No acute abnormality. Atherosclerosis. Electronically Signed   By: Inge Rise M.D.   On: 07/31/2017 17:28   ASSESSMENT AND PLAN:  [name]  is a 76 y.o. female was out on her back porch sweeping some leaves and she stepped back and fell down 4 steps she landed on her face and leg.  Patient could not lift her left leg.   1. Left tibial plateau fracture.   -INR will have to be less than 1.5 in order to do surgery.   -pt recieved oral vitamin K at 6 pm  5mg   -  Patient will have to be on heparin bridge prior and after surgery with history of mechanical aortic valve in the atrial fibrillation.  No contraindications to surgery at this point once INR less than 1.5. -repeat INR since it is the same like last pm  2.   Atrial fibrillation with rapid ventricular response.  Likely secondary to pain.  Continue rate controlling medications.  Hold anticoagulation and will have to start heparin once INR less than 2.0.  3.  History of congestive heart failure.  No signs of congestive heart failure currently.  Last ejection fraction greater than 55%.  4.  History of breast cancer.  5.  History of gout.   Case discussed with Care Management/Social Worker. Management plans discussed with the patient, family and they are in agreement.  CODE STATUS: FULL  DVT Prophylaxis: therap INR  TOTAL TIME TAKING CARE OF THIS PATIENT: *25 minutes.  >50% time spent on counselling and coordination of care  POSSIBLE D/C IN 2-3 DAYS, DEPENDING ON CLINICAL CONDITION.  Note: This dictation was prepared with Dragon dictation along with smaller phrase technology. Any transcriptional errors that result from this process are unintentional.  Fritzi Mandes M.D on 08/01/2017 at 6:47 AM  Between 7am to 6pm - Pager - [phone]  After 6pm go to [email or web address] - password [email or web address] [facility]  Office  [phone]  CC: Primary care physician; [name], MD

## 2017-08-01 NOTE — Progress Notes (Signed)
Nurse notified that patient is transferring to [facility], Bed [phone]. Report called to [phone] to RN, Posey Pronto.  [facility] called for transportation. Pt added to transportation list.

## 2017-08-02 SURGERY — OPEN REDUCTION INTERNAL FIXATION (ORIF) TIBIAL PLATEAU
Anesthesia: Choice | Laterality: Left

## 2017-08-04 DIAGNOSIS — M6281 Muscle weakness (generalized): Secondary | ICD-10-CM | POA: Diagnosis not present

## 2017-08-04 DIAGNOSIS — S82142A Displaced bicondylar fracture of left tibia, initial encounter for closed fracture: Secondary | ICD-10-CM | POA: Diagnosis not present

## 2017-08-04 DIAGNOSIS — Z9889 Other specified postprocedural states: Secondary | ICD-10-CM | POA: Diagnosis not present

## 2017-08-04 DIAGNOSIS — S6992XA Unspecified injury of left wrist, hand and finger(s), initial encounter: Secondary | ICD-10-CM | POA: Diagnosis present

## 2017-08-04 DIAGNOSIS — I5022 Chronic systolic (congestive) heart failure: Secondary | ICD-10-CM | POA: Diagnosis not present

## 2017-08-04 DIAGNOSIS — M109 Gout, unspecified: Secondary | ICD-10-CM | POA: Diagnosis present

## 2017-08-04 DIAGNOSIS — Z5181 Encounter for therapeutic drug level monitoring: Secondary | ICD-10-CM | POA: Diagnosis not present

## 2017-08-04 DIAGNOSIS — W010XXA Fall on same level from slipping, tripping and stumbling without subsequent striking against object, initial encounter: Secondary | ICD-10-CM | POA: Diagnosis not present

## 2017-08-04 DIAGNOSIS — S82142D Displaced bicondylar fracture of left tibia, subsequent encounter for closed fracture with routine healing: Secondary | ICD-10-CM | POA: Diagnosis not present

## 2017-08-04 DIAGNOSIS — S52502S Unspecified fracture of the lower end of left radius, sequela: Secondary | ICD-10-CM | POA: Diagnosis not present

## 2017-08-04 DIAGNOSIS — I4891 Unspecified atrial fibrillation: Secondary | ICD-10-CM | POA: Diagnosis not present

## 2017-08-04 DIAGNOSIS — Z79899 Other long term (current) drug therapy: Secondary | ICD-10-CM | POA: Diagnosis not present

## 2017-08-04 DIAGNOSIS — Y92129 Unspecified place in nursing home as the place of occurrence of the external cause: Secondary | ICD-10-CM | POA: Diagnosis not present

## 2017-08-04 DIAGNOSIS — I110 Hypertensive heart disease with heart failure: Secondary | ICD-10-CM | POA: Diagnosis present

## 2017-08-04 DIAGNOSIS — J449 Chronic obstructive pulmonary disease, unspecified: Secondary | ICD-10-CM | POA: Diagnosis not present

## 2017-08-04 DIAGNOSIS — S82832D Other fracture of upper and lower end of left fibula, subsequent encounter for closed fracture with routine healing: Secondary | ICD-10-CM | POA: Diagnosis not present

## 2017-08-04 DIAGNOSIS — I350 Nonrheumatic aortic (valve) stenosis: Secondary | ICD-10-CM | POA: Diagnosis not present

## 2017-08-04 DIAGNOSIS — I429 Cardiomyopathy, unspecified: Secondary | ICD-10-CM | POA: Diagnosis present

## 2017-08-04 DIAGNOSIS — I481 Persistent atrial fibrillation: Secondary | ICD-10-CM | POA: Diagnosis not present

## 2017-08-04 DIAGNOSIS — Z853 Personal history of malignant neoplasm of breast: Secondary | ICD-10-CM | POA: Diagnosis not present

## 2017-08-04 DIAGNOSIS — Y93H9 Activity, other involving exterior property and land maintenance, building and construction: Secondary | ICD-10-CM | POA: Diagnosis not present

## 2017-08-04 DIAGNOSIS — I5042 Chronic combined systolic (congestive) and diastolic (congestive) heart failure: Secondary | ICD-10-CM | POA: Diagnosis present

## 2017-08-04 DIAGNOSIS — F419 Anxiety disorder, unspecified: Secondary | ICD-10-CM | POA: Diagnosis not present

## 2017-08-04 DIAGNOSIS — I272 Pulmonary hypertension, unspecified: Secondary | ICD-10-CM | POA: Diagnosis not present

## 2017-08-04 DIAGNOSIS — G8918 Other acute postprocedural pain: Secondary | ICD-10-CM | POA: Diagnosis not present

## 2017-08-04 DIAGNOSIS — Z87891 Personal history of nicotine dependence: Secondary | ICD-10-CM | POA: Diagnosis not present

## 2017-08-04 DIAGNOSIS — J42 Unspecified chronic bronchitis: Secondary | ICD-10-CM | POA: Diagnosis not present

## 2017-08-04 DIAGNOSIS — W108XXA Fall (on) (from) other stairs and steps, initial encounter: Secondary | ICD-10-CM | POA: Diagnosis not present

## 2017-08-04 DIAGNOSIS — S52572A Other intraarticular fracture of lower end of left radius, initial encounter for closed fracture: Secondary | ICD-10-CM | POA: Diagnosis not present

## 2017-08-04 DIAGNOSIS — Y33XXXA Other specified events, undetermined intent, initial encounter: Secondary | ICD-10-CM | POA: Diagnosis not present

## 2017-08-04 DIAGNOSIS — Z952 Presence of prosthetic heart valve: Secondary | ICD-10-CM | POA: Insufficient documentation

## 2017-08-04 DIAGNOSIS — I351 Nonrheumatic aortic (valve) insufficiency: Secondary | ICD-10-CM | POA: Diagnosis not present

## 2017-08-04 DIAGNOSIS — Z7401 Bed confinement status: Secondary | ICD-10-CM | POA: Diagnosis not present

## 2017-08-04 DIAGNOSIS — Z7901 Long term (current) use of anticoagulants: Secondary | ICD-10-CM | POA: Diagnosis not present

## 2017-08-04 DIAGNOSIS — Z7982 Long term (current) use of aspirin: Secondary | ICD-10-CM | POA: Diagnosis not present

## 2017-08-04 DIAGNOSIS — W19XXXA Unspecified fall, initial encounter: Secondary | ICD-10-CM | POA: Diagnosis not present

## 2017-08-04 DIAGNOSIS — I509 Heart failure, unspecified: Secondary | ICD-10-CM | POA: Diagnosis not present

## 2017-08-04 DIAGNOSIS — Y33XXXD Other specified events, undetermined intent, subsequent encounter: Secondary | ICD-10-CM | POA: Diagnosis not present

## 2017-08-04 DIAGNOSIS — J410 Simple chronic bronchitis: Secondary | ICD-10-CM | POA: Diagnosis not present

## 2017-08-04 DIAGNOSIS — Z923 Personal history of irradiation: Secondary | ICD-10-CM | POA: Diagnosis not present

## 2017-08-04 DIAGNOSIS — S52612A Displaced fracture of left ulna styloid process, initial encounter for closed fracture: Secondary | ICD-10-CM | POA: Diagnosis not present

## 2017-08-04 DIAGNOSIS — S82209A Unspecified fracture of shaft of unspecified tibia, initial encounter for closed fracture: Secondary | ICD-10-CM | POA: Diagnosis not present

## 2017-08-04 DIAGNOSIS — R262 Difficulty in walking, not elsewhere classified: Secondary | ICD-10-CM | POA: Diagnosis not present

## 2017-08-04 DIAGNOSIS — S82102D Unspecified fracture of upper end of left tibia, subsequent encounter for closed fracture with routine healing: Secondary | ICD-10-CM | POA: Diagnosis not present

## 2017-08-04 DIAGNOSIS — W19XXXD Unspecified fall, subsequent encounter: Secondary | ICD-10-CM | POA: Diagnosis not present

## 2017-08-04 DIAGNOSIS — G4700 Insomnia, unspecified: Secondary | ICD-10-CM | POA: Diagnosis not present

## 2017-08-04 DIAGNOSIS — S52602A Unspecified fracture of lower end of left ulna, initial encounter for closed fracture: Secondary | ICD-10-CM | POA: Diagnosis not present

## 2017-08-04 DIAGNOSIS — Y9301 Activity, walking, marching and hiking: Secondary | ICD-10-CM | POA: Diagnosis not present

## 2017-08-04 DIAGNOSIS — S82839A Other fracture of upper and lower end of unspecified fibula, initial encounter for closed fracture: Secondary | ICD-10-CM | POA: Diagnosis present

## 2017-08-04 DIAGNOSIS — Z90710 Acquired absence of both cervix and uterus: Secondary | ICD-10-CM | POA: Diagnosis not present

## 2017-08-04 DIAGNOSIS — S52502A Unspecified fracture of the lower end of left radius, initial encounter for closed fracture: Secondary | ICD-10-CM | POA: Diagnosis not present

## 2017-08-04 DIAGNOSIS — E7800 Pure hypercholesterolemia, unspecified: Secondary | ICD-10-CM | POA: Diagnosis not present

## 2017-08-04 DIAGNOSIS — E785 Hyperlipidemia, unspecified: Secondary | ICD-10-CM | POA: Diagnosis present

## 2017-08-04 DIAGNOSIS — S52501A Unspecified fracture of the lower end of right radius, initial encounter for closed fracture: Secondary | ICD-10-CM | POA: Diagnosis not present

## 2017-08-04 DIAGNOSIS — M25532 Pain in left wrist: Secondary | ICD-10-CM | POA: Diagnosis not present

## 2017-08-04 DIAGNOSIS — Y999 Unspecified external cause status: Secondary | ICD-10-CM | POA: Diagnosis not present

## 2017-08-05 DIAGNOSIS — I5022 Chronic systolic (congestive) heart failure: Secondary | ICD-10-CM | POA: Diagnosis not present

## 2017-08-05 DIAGNOSIS — S82142A Displaced bicondylar fracture of left tibia, initial encounter for closed fracture: Secondary | ICD-10-CM | POA: Diagnosis not present

## 2017-08-05 DIAGNOSIS — I481 Persistent atrial fibrillation: Secondary | ICD-10-CM | POA: Diagnosis not present

## 2017-08-05 DIAGNOSIS — J410 Simple chronic bronchitis: Secondary | ICD-10-CM | POA: Diagnosis not present

## 2017-08-05 DIAGNOSIS — Z952 Presence of prosthetic heart valve: Secondary | ICD-10-CM | POA: Diagnosis not present

## 2017-08-12 DIAGNOSIS — Z952 Presence of prosthetic heart valve: Secondary | ICD-10-CM | POA: Diagnosis not present

## 2017-08-12 DIAGNOSIS — I4891 Unspecified atrial fibrillation: Secondary | ICD-10-CM | POA: Diagnosis not present

## 2017-08-12 DIAGNOSIS — S82209A Unspecified fracture of shaft of unspecified tibia, initial encounter for closed fracture: Secondary | ICD-10-CM | POA: Diagnosis not present

## 2017-08-12 DIAGNOSIS — I509 Heart failure, unspecified: Secondary | ICD-10-CM | POA: Diagnosis not present

## 2017-08-14 ENCOUNTER — Ambulatory Visit: Payer: Medicare Other | Admitting: Family Medicine

## 2017-08-19 DIAGNOSIS — S82832D Other fracture of upper and lower end of left fibula, subsequent encounter for closed fracture with routine healing: Secondary | ICD-10-CM | POA: Diagnosis not present

## 2017-08-19 DIAGNOSIS — S82142A Displaced bicondylar fracture of left tibia, initial encounter for closed fracture: Secondary | ICD-10-CM | POA: Diagnosis not present

## 2017-08-19 DIAGNOSIS — Y33XXXD Other specified events, undetermined intent, subsequent encounter: Secondary | ICD-10-CM | POA: Diagnosis not present

## 2017-08-19 DIAGNOSIS — Z79899 Other long term (current) drug therapy: Secondary | ICD-10-CM | POA: Diagnosis not present

## 2017-08-19 DIAGNOSIS — Z5181 Encounter for therapeutic drug level monitoring: Secondary | ICD-10-CM | POA: Diagnosis not present

## 2017-08-19 DIAGNOSIS — Z87891 Personal history of nicotine dependence: Secondary | ICD-10-CM | POA: Diagnosis not present

## 2017-08-19 DIAGNOSIS — S82142D Displaced bicondylar fracture of left tibia, subsequent encounter for closed fracture with routine healing: Secondary | ICD-10-CM | POA: Diagnosis not present

## 2017-08-21 DIAGNOSIS — I350 Nonrheumatic aortic (valve) stenosis: Secondary | ICD-10-CM | POA: Diagnosis not present

## 2017-08-21 DIAGNOSIS — Z952 Presence of prosthetic heart valve: Secondary | ICD-10-CM | POA: Diagnosis not present

## 2017-08-24 ENCOUNTER — Emergency Department
Admission: EM | Admit: 2017-08-24 | Discharge: 2017-08-24 | Disposition: A | Discharge status: Home / Self Care | Payer: Medicare Other | Attending: Emergency Medicine | Admitting: Emergency Medicine

## 2017-08-24 ENCOUNTER — Other Ambulatory Visit: Payer: Self-pay | Admitting: *Deleted

## 2017-08-24 ENCOUNTER — Emergency Department: Payer: Medicare Other

## 2017-08-24 ENCOUNTER — Other Ambulatory Visit: Payer: Self-pay

## 2017-08-24 DIAGNOSIS — S52502A Unspecified fracture of the lower end of left radius, initial encounter for closed fracture: Secondary | ICD-10-CM | POA: Diagnosis not present

## 2017-08-24 DIAGNOSIS — S52612A Displaced fracture of left ulna styloid process, initial encounter for closed fracture: Secondary | ICD-10-CM | POA: Diagnosis not present

## 2017-08-24 DIAGNOSIS — Z79899 Other long term (current) drug therapy: Secondary | ICD-10-CM | POA: Insufficient documentation

## 2017-08-24 DIAGNOSIS — I5042 Chronic combined systolic (congestive) and diastolic (congestive) heart failure: Secondary | ICD-10-CM | POA: Insufficient documentation

## 2017-08-24 DIAGNOSIS — Z87891 Personal history of nicotine dependence: Secondary | ICD-10-CM | POA: Insufficient documentation

## 2017-08-24 DIAGNOSIS — Y92129 Unspecified place in nursing home as the place of occurrence of the external cause: Secondary | ICD-10-CM | POA: Insufficient documentation

## 2017-08-24 DIAGNOSIS — S52501A Unspecified fracture of the lower end of right radius, initial encounter for closed fracture: Secondary | ICD-10-CM | POA: Insufficient documentation

## 2017-08-24 DIAGNOSIS — S52602A Unspecified fracture of lower end of left ulna, initial encounter for closed fracture: Secondary | ICD-10-CM

## 2017-08-24 DIAGNOSIS — W010XXA Fall on same level from slipping, tripping and stumbling without subsequent striking against object, initial encounter: Secondary | ICD-10-CM | POA: Insufficient documentation

## 2017-08-24 DIAGNOSIS — J449 Chronic obstructive pulmonary disease, unspecified: Secondary | ICD-10-CM | POA: Diagnosis not present

## 2017-08-24 DIAGNOSIS — I4891 Unspecified atrial fibrillation: Secondary | ICD-10-CM | POA: Diagnosis not present

## 2017-08-24 DIAGNOSIS — Y9301 Activity, walking, marching and hiking: Secondary | ICD-10-CM | POA: Insufficient documentation

## 2017-08-24 DIAGNOSIS — I110 Hypertensive heart disease with heart failure: Secondary | ICD-10-CM | POA: Insufficient documentation

## 2017-08-24 DIAGNOSIS — Y999 Unspecified external cause status: Secondary | ICD-10-CM | POA: Insufficient documentation

## 2017-08-24 MED ORDER — FENTANYL CITRATE (PF) 100 MCG/2ML IJ SOLN
25.0000 ug | Freq: Once | INTRAMUSCULAR | Status: AC
  Administered 2017-08-24: 25 ug via INTRAVENOUS
  Filled 2017-08-24: qty 2

## 2017-08-24 MED ORDER — ONDANSETRON HCL 4 MG/2ML IJ SOLN
4.0000 mg | Freq: Once | INTRAMUSCULAR | Status: AC
  Administered 2017-08-24: 4 mg via INTRAVENOUS
  Filled 2017-08-24: qty 2

## 2017-08-24 MED ORDER — LIDOCAINE HCL (PF) 1 % IJ SOLN
INTRAMUSCULAR | Status: AC
  Filled 2017-08-24: qty 5

## 2017-08-24 MED ORDER — OXYCODONE-ACETAMINOPHEN 5-325 MG PO TABS
1.0000 | ORAL_TABLET | Freq: Once | ORAL | Status: AC
  Administered 2017-08-24: 1 via ORAL
  Filled 2017-08-24: qty 1

## 2017-08-24 MED ORDER — BUPIVACAINE HCL (PF) 0.5 % IJ SOLN
INTRAMUSCULAR | Status: AC
  Filled 2017-08-24: qty 30

## 2017-08-24 NOTE — ED Notes (Signed)
Doctor in room to update pt on careplan. Awaiting Dr. Sabra Heck to reduce her wrist.

## 2017-08-24 NOTE — Discharge Instructions (Signed)
Please follow-up with [facility] for further treatment.  Return to the emergency department for any increased pain, or any other symptom personally concerning to yourself.

## 2017-08-24 NOTE — Patient Outreach (Signed)
[facility]) Care Management  08/24/2017  [name] Feb 15, 1941 [id number]  Met with [name], the Business office manager. She reports patient went to ED due to fall and wrist fracture.  She is scheduled for a leg surgery later this week at [facility], the plan is to return to facility for ongoing skilled nursing post op.  Plan to continue to follow up with patient closer to discharge.  Royetta Crochet. [name], RN, BSN, [place] [phone]) Business Cell  [phone]) Toll Free Office

## 2017-08-24 NOTE — ED Triage Notes (Signed)
Pt comes via [facility] from [facility] with c/o of fall. Pt was reaching for something and fall and hit her left wrist. Pt has swelling and obvious deformity to left wrist. Pt took 2 tylenol and 1 oxy at 10:00pm. Pt states pain 6/10. Pt is A&OX4. Respirations even and unlabored.

## 2017-08-24 NOTE — ED Notes (Signed)
Pt assisted to wheelchair, and was escorted to [facility]. She is alert and oriented X4. VSS, NAD, Wrist is stabilized with OCL. Pulses present. Cap refill less than three. No questions or concerns voiced during discharge instructions.

## 2017-08-24 NOTE — ED Provider Notes (Signed)
[facility] Emergency Department Provider Note  Time seen: 7:25 AM  I have reviewed the triage vital signs and the nursing notes.   HISTORY  Chief Complaint Fall    HPI [name] is a 76 y.o. female with a past medical history of atrial fibrillation, CHF, COPD, hyperlipidemia, who presents to the emergency department after a fall.  According to the patient she has a left lower extremity tibia fracture, is currently nonweightbearing to left lower extremity so she is living at [facility].  She is supposed to go to [facility] this Thursday for an operation on her left lower extremity.  Patient states she was attempting to get to the restroom last night fell forwards landing on her left hand/arm.  States immediate pain to the left hand/arm.  They waited an x-ray at their facility this morning indicating a fracture and I sent the patient to the emergency department for evaluation.  Past Medical History:  Diagnosis Date  . Atrial fibrillation ([place])   . Breast cancer ([place]) 1992   positive, radiation  . CHF (congestive heart failure) ([place])   . COPD (chronic obstructive pulmonary disease) ([place])   . History of aortic valve replacement   . Hyperlipidemia     Patient Active Problem List   Diagnosis Date Noted  . Tibial plateau fracture 07/31/2017  . Chronic systolic heart failure ([place]) 10/08/2015  . Chronic obstructive pulmonary disease ([place]) 07/04/2015  . Controlled gout 06/06/2015  . Anxiety 02/09/2015  . Synovial cyst of popliteal space 02/09/2015  . Benign hypertension 02/09/2015  . Blood type A+ 02/09/2015  . Arterial branch occlusion of retina 02/09/2015  . Chronic constipation 02/09/2015  . Insomnia, persistent 02/09/2015  . Chronic combined systolic and diastolic CHF, NYHA class 1 ([place]) 02/09/2015  . Compromised kidney function 02/09/2015  . Dyslipidemia 02/09/2015  . Atrial fibrillation ([place]) 02/09/2015  . Arthritis  urica 02/09/2015  . Hearing loss 02/09/2015  . History of open heart surgery 02/09/2015  . Chronic hoarseness 02/09/2015  . Cardiomegaly 02/09/2015  . Dysmetabolic syndrome 84/13/2440  . Migraine with aura and without status migrainosus, not intractable 02/09/2015  . Adult BMI 30+ 02/09/2015  . Hypertensive pulmonary vascular disease ([place]) 02/09/2015  . Allergic rhinitis, seasonal 02/09/2015  . Vitamin D deficiency 02/09/2015  . History of mitral valve repair 10/01/2012  . History of aortic valve replacement 10/01/2012  . Congestive heart failure ([place]) 10/01/2012    Past Surgical History:  Procedure Laterality Date  . ABDOMINAL HYSTERECTOMY  1996  . BREAST BIOPSY Right 1998   neg  . BREAST BIOPSY Right 2007   neg  . BREAST BIOPSY Right 1992   positive  . BREAST EXCISIONAL BIOPSY Left 2000   neg  . CARDIAC VALVE REPLACEMENT  2014    Prior to Admission medications   Medication Sig Start Date End Date Taking? Authorizing Provider  acetaminophen (TYLENOL) 500 MG tablet Take 1,000-1,500 mg by mouth every 6 (six) hours as needed for mild pain.    [provider]  allopurinol (ZYLOPRIM) 300 MG tablet Take 1 tablet (300 mg total) by mouth daily. 01/28/17   [name], MD  digoxin (LANOXIN) 0.125 MG tablet TAKE 1 TABLET (0.125 MG TOTAL) BY MOUTH ONCE DAILY. 12/21/15   [provider]  ezetimibe (ZETIA) 10 MG tablet Take 1 tablet by mouth daily.    [provider]  KLOR-CON M20 20 MEQ tablet TAKE 1 TABLET BY MOUTH ONCE A DAY 04/17/17  [name], MD  loratadine (CLARITIN) 10 MG tablet Take 10 mg by mouth daily as needed for allergies.    [provider]  metoprolol tartrate (LOPRESSOR) 25 MG tablet Take 1 tablet (25 mg total) by mouth 2 (two) times daily. Patient taking differently: Take 12.5 mg by mouth 2 (two) times daily.  01/28/17   [name], MD  SPIRIVA HANDIHALER 18 MCG inhalation capsule INHALE 1 CASPULE VIA HANDIHALER ONCE A DAY  AT THE SAME TIME EVERY DAY 05/27/16   [name], MD  torsemide (DEMADEX) 20 MG tablet Take 1 tablet (20 mg total) by mouth daily. Patient taking differently: Take 20 mg by mouth 2 (two) times daily.  04/14/17   [name], MD    Allergies  Allergen Reactions  . Ace Inhibitors Cough and Other (See Comments)  . Amoxicillin-Pot Clavulanate Nausea And Vomiting  . Codeine Nausea And Vomiting  . Penicillins Nausea And Vomiting and Other (See Comments)    Has patient had a PCN reaction causing immediate rash, facial/tongue/throat swelling, SOB or lightheadedness with hypotension: No Has patient had a PCN reaction causing severe rash involving mucus membranes or skin necrosis: No Has patient had a PCN reaction that required hospitalization No Has patient had a PCN reaction occurring within the last 10 years: No If all of the above answers are "NO", then may proceed with Cephalosporin use.  . Rosuvastatin Other (See Comments)    Reaction:  Joint pain   . Tetanus-Diphtheria Toxoids Td Swelling    Family History  Problem Relation Age of Onset  . Cancer Mother   . Diabetes Mother   . Hypertension Mother   . Breast cancer Mother 62  . Hypertension Father   . Heart failure Father   . Cancer Brother        bladder  . Cancer Sister        breast  . Breast cancer Sister 5  . Hypertension Son   . Hypertension Daughter   . Breast cancer Sister 51    Social History Social History   Tobacco Use  . Smoking status: Former Smoker    Last attempt to quit: 09/09/2007    Years since quitting: 9.9  . Smokeless tobacco: Never Used  Substance Use Topics  . Alcohol use: No    Alcohol/week: 0.0 oz  . Drug use: No    Review of Systems Constitutional: Negative for fever. Cardiovascular: Negative for chest pain. Respiratory: Negative for shortness of breath. Gastrointestinal: Negative for abdominal pain Musculoskeletal: Left arm pain/deformity Neurological: Negative for headache All  other ROS negative  ____________________________________________   PHYSICAL EXAM:  VITAL SIGNS: ED Triage Vitals  Enc Vitals Group     BP 08/24/17 0646 (!) 162/77     Pulse Rate 08/24/17 0646 72     Resp 08/24/17 0646 19     Temp 08/24/17 0646 98.2 F (36.8 C)     Temp Source 08/24/17 0646 Oral     SpO2 08/24/17 0646 98 %     Weight 08/24/17 0647 165 lb (74.8 kg)     Height 08/24/17 0647 5\' 3"  (1.6 m)     Head Circumference --      Peak Flow --      Pain Score 08/24/17 0646 6     Pain Loc --      Pain Edu? --      Excl. in [place]? --    Constitutional: Alert and oriented. Well appearing and in no distress. Eyes: Normal  exam ENT   Head: Normocephalic and atraumatic.   Mouth/Throat: Mucous membranes are moist. Cardiovascular: Normal rate, regular rhythm.  Respiratory: Normal respiratory effort without tachypnea nor retractions. Breath sounds are clear Gastrointestinal: Soft and nontender. No distention.   Musculoskeletal: Patient has a deformity to the distal left upper extremity.  Moderate tenderness about the deformity and wrist.  Neurovascularly intact with 2+ radial pulse good capillary refill and intact sensation able to perform grip strength testing. Neurologic:  Normal speech and language. No gross focal neurologic deficits  Skin:  Skin is warm, dry and intact.  No laceration. Psychiatric: Mood and affect are normal.  ____________________________________________   RADIOLOGY  Left distal radius fracture  ____________________________________________   INITIAL IMPRESSION / ASSESSMENT AND PLAN / ED COURSE  Pertinent labs & imaging results that were available during my care of the patient were reviewed by me and considered in my medical decision making (see chart for details).  Patient presents to the emergency department after a fall onto her outstretched left hand.  Differential would include radial fracture, radius/ulna fracture, Smith fracture, Colles'  fracture, dislocation, hematoma.  X-ray consistent with distal radius fracture.  Discussed with orthopedics they will begin to perform a hematoma block for reduction.  Orthopedics has seen the patient performed a reduction with hematoma block in the room.  Repeat x-ray shows market improvement in alignment.  Patient has been splinted we will discharge home.  Patient prefers to follow up with [facility] which she is already seeing for her leg injury.  ____________________________________________   FINAL CLINICAL IMPRESSION(S) / ED DIAGNOSES  Distal radius fracture, left    Harvest Dark, MD 08/24/17 [phone]

## 2017-08-24 NOTE — Consult Note (Addendum)
ORTHOPAEDIC CONSULTATION  REQUESTING PHYSICIAN: Harvest Dark, MD  Chief Complaint:   Left wrist injury.  History of Present Illness: [name] is a 76 y.o. female with a history of COPD, congestive heart failure, atrial fibrillation, hyperlipidemia, and status post aortic valve replacement has been residing in a rehab facility while awaiting surgery to fix a displaced left tibial plateau fracture.  The patient has been nonweightbearing on her left leg and is scheduled to undergo surgery at [facility] on 08/27/17.  Apparently, earlier this morning, she lost her balance and fell onto her outstretched left hand.  She was brought to the emergency room at [facility] where x-rays demonstrated a dorsally displaced fracture of the left distal radius with an ulnar styloid component.  The patient denies any numbness or paresthesias to her fingers.  The patient denies any associated injuries.  She did not strike her head or lose consciousness.  Past Medical History:  Diagnosis Date  . Atrial fibrillation ([place])   . Breast cancer ([place]) 1992   positive, radiation  . CHF (congestive heart failure) ([place])   . COPD (chronic obstructive pulmonary disease) ([place])   . History of aortic valve replacement   . Hyperlipidemia    Past Surgical History:  Procedure Laterality Date  . ABDOMINAL HYSTERECTOMY  1996  . BREAST BIOPSY Right 1998   neg  . BREAST BIOPSY Right 2007   neg  . BREAST BIOPSY Right 1992   positive  . BREAST EXCISIONAL BIOPSY Left 2000   neg  . CARDIAC VALVE REPLACEMENT  2014   Social History   Socioeconomic History  . Marital status: Widowed    Spouse name: None  . Number of children: 2  . Years of education: None  . Highest education level: None  Social Needs  . Financial resource strain: None  . Food insecurity - worry: None  . Food insecurity - inability: None  . Transportation needs - medical: None  .  Transportation needs - non-medical: None  Occupational History  . Occupation: Custodian  Tobacco Use  . Smoking status: Former Smoker    Last attempt to quit: 09/09/2007    Years since quitting: 9.9  . Smokeless tobacco: Never Used  Substance and Sexual Activity  . Alcohol use: No    Alcohol/week: 0.0 oz  . Drug use: No  . Sexual activity: Not Currently  Other Topics Concern  . None  Social History Narrative  . None   Family History  Problem Relation Age of Onset  . Cancer Mother   . Diabetes Mother   . Hypertension Mother   . Breast cancer Mother 33  . Hypertension Father   . Heart failure Father   . Cancer Brother        bladder  . Cancer Sister        breast  . Breast cancer Sister 65  . Hypertension Son   . Hypertension Daughter   . Breast cancer Sister 5   Allergies  Allergen Reactions  . Ace Inhibitors Cough and Other (See Comments)  . Amoxicillin-Pot Clavulanate Nausea And Vomiting  . Codeine Nausea And Vomiting  . Penicillins Nausea And Vomiting and Other (See Comments)    Has patient had a PCN reaction causing immediate rash, facial/tongue/throat swelling, SOB or lightheadedness with hypotension: No Has patient had a PCN reaction causing severe rash involving mucus membranes or skin necrosis: No Has patient had a PCN reaction that required hospitalization No Has patient had a PCN reaction occurring within the  last 10 years: No If all of the above answers are "NO", then may proceed with Cephalosporin use.  . Rosuvastatin Other (See Comments)    Reaction:  Joint pain   . Tetanus-Diphtheria Toxoids Td Swelling   Prior to Admission medications   Medication Sig Start Date End Date Taking? Authorizing Provider  Calcium Carbonate-Vitamin D 600-400 MG-UNIT tablet Take 1 tablet by mouth 2 (two) times daily. 08/04/17 08/04/18 Yes [provider]  colchicine 0.6 MG tablet Take 2 tablets by mouth as needed. Take two tablets (1.2mg ) by mouth at first sign of  gout flare followed by 1 tablet (0.6mg ) after 1 hour. 12/15/16  Yes [provider]  acetaminophen (TYLENOL) 500 MG tablet Take 1,000-1,500 mg by mouth every 6 (six) hours as needed for mild pain.    [provider]  allopurinol (ZYLOPRIM) 300 MG tablet Take 1 tablet (300 mg total) by mouth daily. 01/28/17   [name], MD  digoxin (LANOXIN) 0.125 MG tablet TAKE 1 TABLET (0.125 MG TOTAL) BY MOUTH ONCE DAILY. 12/21/15   [provider]  ezetimibe (ZETIA) 10 MG tablet Take 1 tablet by mouth daily.    [provider]  KLOR-CON M20 20 MEQ tablet TAKE 1 TABLET BY MOUTH ONCE A DAY 04/17/17   Sowles, Drue Stager, MD  loratadine (CLARITIN) 10 MG tablet Take 10 mg by mouth daily as needed for allergies.    [provider]  metoprolol tartrate (LOPRESSOR) 25 MG tablet Take 1 tablet (25 mg total) by mouth 2 (two) times daily. Patient taking differently: Take 12.5 mg by mouth 2 (two) times daily.  01/28/17   [name], MD  SPIRIVA HANDIHALER 18 MCG inhalation capsule INHALE 1 CASPULE VIA HANDIHALER ONCE A DAY AT THE SAME TIME EVERY DAY 05/27/16   [name], MD  torsemide (DEMADEX) 20 MG tablet Take 1 tablet (20 mg total) by mouth daily. Patient taking differently: Take 20 mg by mouth 2 (two) times daily.  04/14/17   [name], MD  warfarin (COUMADIN) 5 MG tablet TAKE 1 TAB ON SUNDAY,MON,WED,AND FRI. TAKE 1&1/2 TABS ON TUES,THURS,AND SAT             Authorized by: Lujean Amel D 08/20/17   [provider]   Dg Wrist Complete Left  Result Date: 08/24/2017 CLINICAL DATA:  The patient fell and injured the left wrist. Swelling and deformity are visible. EXAM: LEFT WRIST - COMPLETE 3+ VIEW COMPARISON:  None in PACs FINDINGS: The patient has sustained a comminuted impacted intra-articular distal radial fracture with significant displacement of the fracture fragments. An impacted ulnar styloid fracture is observed with mild displacement. The  radiocarpal joint space is reasonably well-maintained. The carpal bones exhibit no acute fracture. The intercarpal and carpometacarpal joints are reasonably well maintained for age. There is diffuse soft tissue swelling. IMPRESSION: Acute comminuted impacted and displaced fracture of the distal left radial metaphysis with overall angulation apex volar. There is an impacted and mildly displaced ulnar styloid fracture. Electronically Signed   By: David  Martinique M.D.   On: 08/24/2017 07:25    Positive ROS: All other systems have been reviewed and were otherwise negative with the exception of those mentioned in the HPI and as above.  Physical Exam: General:  Alert, no acute distress Psychiatric:  Patient is competent for consent with normal mood and affect   Cardiovascular:  No pedal edema Respiratory:  No wheezing, non-labored breathing GI:  Abdomen is soft and non-tender Skin:  No lesions in the  area of chief complaint Neurologic:  Sensation intact distally Lymphatic:  No axillary or cervical lymphadenopathy  Orthopedic Exam:  Orthopedic examination is limited to the left forearm and hand.  There is an obvious deformity of the wrist region with moderate swelling in this area.  There are no other skin abnormalities.  Specifically, there is no evidence for any punctures of the skin, nor is there any evidence of rashes, erythema, ecchymosis, or abrasions.  She has pain to palpation over the dorsal aspect of the wrist.  She is able to flex and extend all digits, although with discomfort.  She has intact sensation to light touch to the median, ulnar, and radial nerve distributions.  She has good capillary refill to all digits and a 2+ radial pulse.  X-rays:  X-rays of the left wrist are available for review.  The findings are as described above.  Assessment: Dorsally displaced left distal radius fracture with ulnar styloid fracture.  Plan: The treatment options have been discussed with the patient.   After obtaining verbal consent, the wrist fracture was reduced under hematoma block utilizing manual manipulation before a sugar tong splint was applied, maintaining the wrist in mild flexion and ulnar deviation.  The hematoma block was performed sterilely using 5 cc of 1% lidocaine and 5 cc of 0.5% Sensorcaine.  The patient tolerated the procedure well.  She is neurovascularly into intact to her left hand following the reduction.  Post-reduction films are pending.  The patient requests that if anything definitive needs to be done for the wrist that it be done at [facility].  Apparently, she is scheduled to undergo an open reduction and internal fixation of her tibial plateau fracture on Thursday, 08/27/17.  Therefore, I will forward a note to her [place] orthopedic surgeon regarding this injury.  Meanwhile, the patient is to keep the splint dry and intact, and to keep her hand elevated above heart level.  She may continue her present medication regimen.  She will continue her follow-up orthopedic care at [facility], as per her request.  Thank you for asked me to participate in the care of this most pleasant woman.   Pascal Lux, MD  Beeper #:  [phone]  08/24/2017 8:25 AM

## 2017-08-26 DIAGNOSIS — S52572A Other intraarticular fracture of lower end of left radius, initial encounter for closed fracture: Secondary | ICD-10-CM | POA: Diagnosis not present

## 2017-08-26 DIAGNOSIS — S52502A Unspecified fracture of the lower end of left radius, initial encounter for closed fracture: Secondary | ICD-10-CM | POA: Diagnosis not present

## 2017-08-26 DIAGNOSIS — Y33XXXA Other specified events, undetermined intent, initial encounter: Secondary | ICD-10-CM | POA: Diagnosis not present

## 2017-08-26 DIAGNOSIS — M25532 Pain in left wrist: Secondary | ICD-10-CM | POA: Diagnosis not present

## 2017-08-27 DIAGNOSIS — I5042 Chronic combined systolic (congestive) and diastolic (congestive) heart failure: Secondary | ICD-10-CM | POA: Diagnosis present

## 2017-08-27 DIAGNOSIS — W19XXXD Unspecified fall, subsequent encounter: Secondary | ICD-10-CM | POA: Diagnosis not present

## 2017-08-27 DIAGNOSIS — S52615D Nondisplaced fracture of left ulna styloid process, subsequent encounter for closed fracture with routine healing: Secondary | ICD-10-CM | POA: Diagnosis not present

## 2017-08-27 DIAGNOSIS — Z7901 Long term (current) use of anticoagulants: Secondary | ICD-10-CM | POA: Diagnosis not present

## 2017-08-27 DIAGNOSIS — I429 Cardiomyopathy, unspecified: Secondary | ICD-10-CM | POA: Diagnosis present

## 2017-08-27 DIAGNOSIS — E785 Hyperlipidemia, unspecified: Secondary | ICD-10-CM | POA: Diagnosis present

## 2017-08-27 DIAGNOSIS — G4700 Insomnia, unspecified: Secondary | ICD-10-CM | POA: Diagnosis not present

## 2017-08-27 DIAGNOSIS — S52572A Other intraarticular fracture of lower end of left radius, initial encounter for closed fracture: Secondary | ICD-10-CM | POA: Diagnosis present

## 2017-08-27 DIAGNOSIS — J410 Simple chronic bronchitis: Secondary | ICD-10-CM | POA: Diagnosis not present

## 2017-08-27 DIAGNOSIS — M79602 Pain in left arm: Secondary | ICD-10-CM | POA: Diagnosis not present

## 2017-08-27 DIAGNOSIS — I351 Nonrheumatic aortic (valve) insufficiency: Secondary | ICD-10-CM | POA: Diagnosis not present

## 2017-08-27 DIAGNOSIS — I272 Pulmonary hypertension, unspecified: Secondary | ICD-10-CM | POA: Diagnosis not present

## 2017-08-27 DIAGNOSIS — Z853 Personal history of malignant neoplasm of breast: Secondary | ICD-10-CM | POA: Diagnosis not present

## 2017-08-27 DIAGNOSIS — G8918 Other acute postprocedural pain: Secondary | ICD-10-CM | POA: Diagnosis not present

## 2017-08-27 DIAGNOSIS — M109 Gout, unspecified: Secondary | ICD-10-CM | POA: Diagnosis present

## 2017-08-27 DIAGNOSIS — S82839A Other fracture of upper and lower end of unspecified fibula, initial encounter for closed fracture: Secondary | ICD-10-CM | POA: Diagnosis present

## 2017-08-27 DIAGNOSIS — I350 Nonrheumatic aortic (valve) stenosis: Secondary | ICD-10-CM | POA: Diagnosis not present

## 2017-08-27 DIAGNOSIS — Z923 Personal history of irradiation: Secondary | ICD-10-CM | POA: Diagnosis not present

## 2017-08-27 DIAGNOSIS — S52502D Unspecified fracture of the lower end of left radius, subsequent encounter for closed fracture with routine healing: Secondary | ICD-10-CM | POA: Diagnosis not present

## 2017-08-27 DIAGNOSIS — S52502S Unspecified fracture of the lower end of left radius, sequela: Secondary | ICD-10-CM | POA: Diagnosis not present

## 2017-08-27 DIAGNOSIS — I5022 Chronic systolic (congestive) heart failure: Secondary | ICD-10-CM | POA: Diagnosis not present

## 2017-08-27 DIAGNOSIS — Z7982 Long term (current) use of aspirin: Secondary | ICD-10-CM | POA: Diagnosis not present

## 2017-08-27 DIAGNOSIS — Z952 Presence of prosthetic heart valve: Secondary | ICD-10-CM | POA: Diagnosis not present

## 2017-08-27 DIAGNOSIS — Z90710 Acquired absence of both cervix and uterus: Secondary | ICD-10-CM | POA: Diagnosis not present

## 2017-08-27 DIAGNOSIS — J449 Chronic obstructive pulmonary disease, unspecified: Secondary | ICD-10-CM | POA: Diagnosis not present

## 2017-08-27 DIAGNOSIS — S82142A Displaced bicondylar fracture of left tibia, initial encounter for closed fracture: Secondary | ICD-10-CM | POA: Diagnosis not present

## 2017-08-27 DIAGNOSIS — Z9889 Other specified postprocedural states: Secondary | ICD-10-CM | POA: Diagnosis not present

## 2017-08-27 DIAGNOSIS — E559 Vitamin D deficiency, unspecified: Secondary | ICD-10-CM | POA: Diagnosis not present

## 2017-08-27 DIAGNOSIS — S52502A Unspecified fracture of the lower end of left radius, initial encounter for closed fracture: Secondary | ICD-10-CM | POA: Diagnosis not present

## 2017-08-27 DIAGNOSIS — I509 Heart failure, unspecified: Secondary | ICD-10-CM | POA: Diagnosis not present

## 2017-08-27 DIAGNOSIS — F419 Anxiety disorder, unspecified: Secondary | ICD-10-CM | POA: Diagnosis not present

## 2017-08-27 DIAGNOSIS — M79605 Pain in left leg: Secondary | ICD-10-CM | POA: Diagnosis not present

## 2017-08-27 DIAGNOSIS — R262 Difficulty in walking, not elsewhere classified: Secondary | ICD-10-CM | POA: Diagnosis not present

## 2017-08-27 DIAGNOSIS — Z87891 Personal history of nicotine dependence: Secondary | ICD-10-CM | POA: Diagnosis not present

## 2017-08-27 DIAGNOSIS — S82142D Displaced bicondylar fracture of left tibia, subsequent encounter for closed fracture with routine healing: Secondary | ICD-10-CM | POA: Diagnosis not present

## 2017-08-27 DIAGNOSIS — Y93H9 Activity, other involving exterior property and land maintenance, building and construction: Secondary | ICD-10-CM | POA: Diagnosis not present

## 2017-08-27 DIAGNOSIS — E7800 Pure hypercholesterolemia, unspecified: Secondary | ICD-10-CM | POA: Diagnosis not present

## 2017-08-27 DIAGNOSIS — I4891 Unspecified atrial fibrillation: Secondary | ICD-10-CM | POA: Diagnosis not present

## 2017-08-27 DIAGNOSIS — Y33XXXD Other specified events, undetermined intent, subsequent encounter: Secondary | ICD-10-CM | POA: Diagnosis not present

## 2017-08-27 DIAGNOSIS — W108XXA Fall (on) (from) other stairs and steps, initial encounter: Secondary | ICD-10-CM | POA: Diagnosis not present

## 2017-08-27 DIAGNOSIS — S82102D Unspecified fracture of upper end of left tibia, subsequent encounter for closed fracture with routine healing: Secondary | ICD-10-CM | POA: Diagnosis not present

## 2017-08-27 DIAGNOSIS — I110 Hypertensive heart disease with heart failure: Secondary | ICD-10-CM | POA: Diagnosis present

## 2017-08-27 DIAGNOSIS — S52572D Other intraarticular fracture of lower end of left radius, subsequent encounter for closed fracture with routine healing: Secondary | ICD-10-CM | POA: Diagnosis not present

## 2017-08-29 DIAGNOSIS — S52502D Unspecified fracture of the lower end of left radius, subsequent encounter for closed fracture with routine healing: Secondary | ICD-10-CM | POA: Diagnosis not present

## 2017-08-29 DIAGNOSIS — S52572A Other intraarticular fracture of lower end of left radius, initial encounter for closed fracture: Secondary | ICD-10-CM | POA: Diagnosis not present

## 2017-08-29 DIAGNOSIS — Z79899 Other long term (current) drug therapy: Secondary | ICD-10-CM | POA: Diagnosis not present

## 2017-08-29 DIAGNOSIS — E559 Vitamin D deficiency, unspecified: Secondary | ICD-10-CM | POA: Diagnosis not present

## 2017-08-29 DIAGNOSIS — M79602 Pain in left arm: Secondary | ICD-10-CM | POA: Diagnosis not present

## 2017-08-29 DIAGNOSIS — I4891 Unspecified atrial fibrillation: Secondary | ICD-10-CM | POA: Diagnosis not present

## 2017-08-29 DIAGNOSIS — I5022 Chronic systolic (congestive) heart failure: Secondary | ICD-10-CM | POA: Diagnosis not present

## 2017-08-29 DIAGNOSIS — J410 Simple chronic bronchitis: Secondary | ICD-10-CM | POA: Diagnosis not present

## 2017-08-29 DIAGNOSIS — Y33XXXA Other specified events, undetermined intent, initial encounter: Secondary | ICD-10-CM | POA: Diagnosis not present

## 2017-08-29 DIAGNOSIS — I509 Heart failure, unspecified: Secondary | ICD-10-CM | POA: Diagnosis not present

## 2017-08-29 DIAGNOSIS — M109 Gout, unspecified: Secondary | ICD-10-CM | POA: Diagnosis not present

## 2017-08-29 DIAGNOSIS — Z7901 Long term (current) use of anticoagulants: Secondary | ICD-10-CM | POA: Diagnosis not present

## 2017-08-29 DIAGNOSIS — Z4789 Encounter for other orthopedic aftercare: Secondary | ICD-10-CM | POA: Diagnosis not present

## 2017-08-29 DIAGNOSIS — S82102D Unspecified fracture of upper end of left tibia, subsequent encounter for closed fracture with routine healing: Secondary | ICD-10-CM | POA: Diagnosis not present

## 2017-08-29 DIAGNOSIS — I110 Hypertensive heart disease with heart failure: Secondary | ICD-10-CM | POA: Diagnosis not present

## 2017-08-29 DIAGNOSIS — W19XXXA Unspecified fall, initial encounter: Secondary | ICD-10-CM | POA: Diagnosis not present

## 2017-08-29 DIAGNOSIS — E785 Hyperlipidemia, unspecified: Secondary | ICD-10-CM | POA: Diagnosis not present

## 2017-08-29 DIAGNOSIS — F419 Anxiety disorder, unspecified: Secondary | ICD-10-CM | POA: Diagnosis not present

## 2017-08-29 DIAGNOSIS — I429 Cardiomyopathy, unspecified: Secondary | ICD-10-CM | POA: Diagnosis not present

## 2017-08-29 DIAGNOSIS — I5042 Chronic combined systolic (congestive) and diastolic (congestive) heart failure: Secondary | ICD-10-CM | POA: Diagnosis not present

## 2017-08-29 DIAGNOSIS — Z7982 Long term (current) use of aspirin: Secondary | ICD-10-CM | POA: Diagnosis not present

## 2017-08-29 DIAGNOSIS — W19XXXD Unspecified fall, subsequent encounter: Secondary | ICD-10-CM | POA: Diagnosis not present

## 2017-08-29 DIAGNOSIS — I350 Nonrheumatic aortic (valve) stenosis: Secondary | ICD-10-CM | POA: Diagnosis not present

## 2017-08-29 DIAGNOSIS — Z87891 Personal history of nicotine dependence: Secondary | ICD-10-CM | POA: Diagnosis not present

## 2017-08-29 DIAGNOSIS — R52 Pain, unspecified: Secondary | ICD-10-CM | POA: Diagnosis not present

## 2017-08-29 DIAGNOSIS — S82832D Other fracture of upper and lower end of left fibula, subsequent encounter for closed fracture with routine healing: Secondary | ICD-10-CM | POA: Diagnosis not present

## 2017-08-29 DIAGNOSIS — E7800 Pure hypercholesterolemia, unspecified: Secondary | ICD-10-CM | POA: Diagnosis not present

## 2017-08-29 DIAGNOSIS — Q231 Congenital insufficiency of aortic valve: Secondary | ICD-10-CM | POA: Diagnosis not present

## 2017-08-29 DIAGNOSIS — S82142A Displaced bicondylar fracture of left tibia, initial encounter for closed fracture: Secondary | ICD-10-CM | POA: Diagnosis not present

## 2017-08-29 DIAGNOSIS — J449 Chronic obstructive pulmonary disease, unspecified: Secondary | ICD-10-CM | POA: Diagnosis not present

## 2017-08-29 DIAGNOSIS — M25532 Pain in left wrist: Secondary | ICD-10-CM | POA: Diagnosis not present

## 2017-08-29 DIAGNOSIS — S82142D Displaced bicondylar fracture of left tibia, subsequent encounter for closed fracture with routine healing: Secondary | ICD-10-CM | POA: Diagnosis not present

## 2017-08-29 DIAGNOSIS — Z853 Personal history of malignant neoplasm of breast: Secondary | ICD-10-CM | POA: Diagnosis not present

## 2017-08-29 DIAGNOSIS — G4700 Insomnia, unspecified: Secondary | ICD-10-CM | POA: Diagnosis not present

## 2017-08-29 DIAGNOSIS — Z952 Presence of prosthetic heart valve: Secondary | ICD-10-CM | POA: Diagnosis not present

## 2017-08-29 DIAGNOSIS — I272 Pulmonary hypertension, unspecified: Secondary | ICD-10-CM | POA: Diagnosis not present

## 2017-08-29 DIAGNOSIS — M79605 Pain in left leg: Secondary | ICD-10-CM | POA: Diagnosis not present

## 2017-08-31 ENCOUNTER — Other Ambulatory Visit: Payer: Self-pay | Admitting: Family Medicine

## 2017-08-31 DIAGNOSIS — I480 Paroxysmal atrial fibrillation: Secondary | ICD-10-CM

## 2017-08-31 NOTE — Telephone Encounter (Signed)
Hypertension medication request: [facility]  Last office visit pertaining to hypertension: 08/24/2017  BP Readings from Last 3 Encounters:  08/24/17 (!) 162/61  08/01/17 126/79  05/13/17 136/82    Lab Results  Component Value Date   CREATININE 0.74 08/01/2017   BUN 19 08/01/2017   NA 138 08/01/2017   K 3.3 (L) 08/01/2017   CL 102 08/01/2017   CO2 28 08/01/2017     No future follow up

## 2017-09-02 DIAGNOSIS — S52572A Other intraarticular fracture of lower end of left radius, initial encounter for closed fracture: Secondary | ICD-10-CM | POA: Diagnosis not present

## 2017-09-02 DIAGNOSIS — I272 Pulmonary hypertension, unspecified: Secondary | ICD-10-CM | POA: Diagnosis not present

## 2017-09-02 DIAGNOSIS — E7800 Pure hypercholesterolemia, unspecified: Secondary | ICD-10-CM | POA: Diagnosis not present

## 2017-09-02 DIAGNOSIS — I5022 Chronic systolic (congestive) heart failure: Secondary | ICD-10-CM | POA: Diagnosis not present

## 2017-09-02 DIAGNOSIS — W19XXXA Unspecified fall, initial encounter: Secondary | ICD-10-CM | POA: Diagnosis not present

## 2017-09-02 DIAGNOSIS — J410 Simple chronic bronchitis: Secondary | ICD-10-CM | POA: Diagnosis not present

## 2017-09-02 DIAGNOSIS — J449 Chronic obstructive pulmonary disease, unspecified: Secondary | ICD-10-CM | POA: Diagnosis not present

## 2017-09-02 DIAGNOSIS — I509 Heart failure, unspecified: Secondary | ICD-10-CM | POA: Diagnosis not present

## 2017-09-02 DIAGNOSIS — I429 Cardiomyopathy, unspecified: Secondary | ICD-10-CM | POA: Diagnosis not present

## 2017-09-02 DIAGNOSIS — I110 Hypertensive heart disease with heart failure: Secondary | ICD-10-CM | POA: Diagnosis not present

## 2017-09-02 DIAGNOSIS — S82142A Displaced bicondylar fracture of left tibia, initial encounter for closed fracture: Secondary | ICD-10-CM | POA: Diagnosis not present

## 2017-09-02 DIAGNOSIS — I4891 Unspecified atrial fibrillation: Secondary | ICD-10-CM | POA: Diagnosis not present

## 2017-09-02 DIAGNOSIS — Z87891 Personal history of nicotine dependence: Secondary | ICD-10-CM | POA: Diagnosis not present

## 2017-09-08 HISTORY — PX: OTHER SURGICAL HISTORY: SHX169

## 2017-09-10 ENCOUNTER — Other Ambulatory Visit: Payer: Self-pay | Admitting: *Deleted

## 2017-09-10 NOTE — Patient Outreach (Signed)
[facility]) Care Management  09/10/2017  [name] 07/20/1941 [id number]   Met with [name], SW at facility.  She reports patient will discharge 09/19/17. She has requested a therapy, nurse and home care aide.   Met with patient at facility. Patient states she is ready to get home. She is tired of the facility. RNCM reviewed [facility] care management services.  Patient reports other than her broken bones and surgeries, she does not feel she could benefit from [facility] program at this time and declines.   RNCM left [facility] brochure and contact. Patient agrees to call with future needs.   Plan to sign off at this time.  Royetta Crochet. [name], RN, BSN, [place] [phone]) Business Cell  [phone]) Toll Free Office

## 2017-09-11 DIAGNOSIS — S82142D Displaced bicondylar fracture of left tibia, subsequent encounter for closed fracture with routine healing: Secondary | ICD-10-CM | POA: Diagnosis not present

## 2017-09-11 DIAGNOSIS — Z87891 Personal history of nicotine dependence: Secondary | ICD-10-CM | POA: Diagnosis not present

## 2017-09-11 DIAGNOSIS — Y33XXXA Other specified events, undetermined intent, initial encounter: Secondary | ICD-10-CM | POA: Diagnosis not present

## 2017-09-11 DIAGNOSIS — S82832D Other fracture of upper and lower end of left fibula, subsequent encounter for closed fracture with routine healing: Secondary | ICD-10-CM | POA: Diagnosis not present

## 2017-09-11 DIAGNOSIS — S82142A Displaced bicondylar fracture of left tibia, initial encounter for closed fracture: Secondary | ICD-10-CM | POA: Diagnosis not present

## 2017-09-16 DIAGNOSIS — G4700 Insomnia, unspecified: Secondary | ICD-10-CM | POA: Diagnosis not present

## 2017-09-16 DIAGNOSIS — Z952 Presence of prosthetic heart valve: Secondary | ICD-10-CM | POA: Diagnosis not present

## 2017-09-16 DIAGNOSIS — R52 Pain, unspecified: Secondary | ICD-10-CM | POA: Diagnosis not present

## 2017-09-16 DIAGNOSIS — Z4789 Encounter for other orthopedic aftercare: Secondary | ICD-10-CM | POA: Diagnosis not present

## 2017-09-18 DIAGNOSIS — M25532 Pain in left wrist: Secondary | ICD-10-CM | POA: Diagnosis not present

## 2017-09-20 ENCOUNTER — Other Ambulatory Visit: Payer: Self-pay | Admitting: Family Medicine

## 2017-09-21 DIAGNOSIS — S52572D Other intraarticular fracture of lower end of left radius, subsequent encounter for closed fracture with routine healing: Secondary | ICD-10-CM | POA: Diagnosis not present

## 2017-09-21 DIAGNOSIS — S82832D Other fracture of upper and lower end of left fibula, subsequent encounter for closed fracture with routine healing: Secondary | ICD-10-CM | POA: Diagnosis not present

## 2017-09-21 DIAGNOSIS — S82142D Displaced bicondylar fracture of left tibia, subsequent encounter for closed fracture with routine healing: Secondary | ICD-10-CM | POA: Diagnosis not present

## 2017-09-21 DIAGNOSIS — I50810 Right heart failure, unspecified: Secondary | ICD-10-CM | POA: Diagnosis not present

## 2017-09-21 DIAGNOSIS — I5042 Chronic combined systolic (congestive) and diastolic (congestive) heart failure: Secondary | ICD-10-CM | POA: Diagnosis not present

## 2017-09-21 DIAGNOSIS — I110 Hypertensive heart disease with heart failure: Secondary | ICD-10-CM | POA: Diagnosis not present

## 2017-09-22 DIAGNOSIS — S82832D Other fracture of upper and lower end of left fibula, subsequent encounter for closed fracture with routine healing: Secondary | ICD-10-CM | POA: Diagnosis not present

## 2017-09-22 DIAGNOSIS — S82142D Displaced bicondylar fracture of left tibia, subsequent encounter for closed fracture with routine healing: Secondary | ICD-10-CM | POA: Diagnosis not present

## 2017-09-22 DIAGNOSIS — I50810 Right heart failure, unspecified: Secondary | ICD-10-CM | POA: Diagnosis not present

## 2017-09-22 DIAGNOSIS — I110 Hypertensive heart disease with heart failure: Secondary | ICD-10-CM | POA: Diagnosis not present

## 2017-09-22 DIAGNOSIS — I5042 Chronic combined systolic (congestive) and diastolic (congestive) heart failure: Secondary | ICD-10-CM | POA: Diagnosis not present

## 2017-09-22 DIAGNOSIS — S52572D Other intraarticular fracture of lower end of left radius, subsequent encounter for closed fracture with routine healing: Secondary | ICD-10-CM | POA: Diagnosis not present

## 2017-09-23 DIAGNOSIS — I50810 Right heart failure, unspecified: Secondary | ICD-10-CM | POA: Diagnosis not present

## 2017-09-23 DIAGNOSIS — S52572D Other intraarticular fracture of lower end of left radius, subsequent encounter for closed fracture with routine healing: Secondary | ICD-10-CM | POA: Diagnosis not present

## 2017-09-23 DIAGNOSIS — S82832D Other fracture of upper and lower end of left fibula, subsequent encounter for closed fracture with routine healing: Secondary | ICD-10-CM | POA: Diagnosis not present

## 2017-09-23 DIAGNOSIS — I110 Hypertensive heart disease with heart failure: Secondary | ICD-10-CM | POA: Diagnosis not present

## 2017-09-23 DIAGNOSIS — S82142D Displaced bicondylar fracture of left tibia, subsequent encounter for closed fracture with routine healing: Secondary | ICD-10-CM | POA: Diagnosis not present

## 2017-09-23 DIAGNOSIS — I5042 Chronic combined systolic (congestive) and diastolic (congestive) heart failure: Secondary | ICD-10-CM | POA: Diagnosis not present

## 2017-09-24 DIAGNOSIS — I50810 Right heart failure, unspecified: Secondary | ICD-10-CM | POA: Diagnosis not present

## 2017-09-24 DIAGNOSIS — I110 Hypertensive heart disease with heart failure: Secondary | ICD-10-CM | POA: Diagnosis not present

## 2017-09-24 DIAGNOSIS — S82832D Other fracture of upper and lower end of left fibula, subsequent encounter for closed fracture with routine healing: Secondary | ICD-10-CM | POA: Diagnosis not present

## 2017-09-24 DIAGNOSIS — I5042 Chronic combined systolic (congestive) and diastolic (congestive) heart failure: Secondary | ICD-10-CM | POA: Diagnosis not present

## 2017-09-24 DIAGNOSIS — S52572D Other intraarticular fracture of lower end of left radius, subsequent encounter for closed fracture with routine healing: Secondary | ICD-10-CM | POA: Diagnosis not present

## 2017-09-24 DIAGNOSIS — S82142D Displaced bicondylar fracture of left tibia, subsequent encounter for closed fracture with routine healing: Secondary | ICD-10-CM | POA: Diagnosis not present

## 2017-09-25 ENCOUNTER — Ambulatory Visit: Payer: Medicare Other | Admitting: Family Medicine

## 2017-09-25 DIAGNOSIS — S52572D Other intraarticular fracture of lower end of left radius, subsequent encounter for closed fracture with routine healing: Secondary | ICD-10-CM | POA: Diagnosis not present

## 2017-09-25 DIAGNOSIS — I110 Hypertensive heart disease with heart failure: Secondary | ICD-10-CM | POA: Diagnosis not present

## 2017-09-25 DIAGNOSIS — S82142D Displaced bicondylar fracture of left tibia, subsequent encounter for closed fracture with routine healing: Secondary | ICD-10-CM | POA: Diagnosis not present

## 2017-09-25 DIAGNOSIS — I5042 Chronic combined systolic (congestive) and diastolic (congestive) heart failure: Secondary | ICD-10-CM | POA: Diagnosis not present

## 2017-09-25 DIAGNOSIS — S82832D Other fracture of upper and lower end of left fibula, subsequent encounter for closed fracture with routine healing: Secondary | ICD-10-CM | POA: Diagnosis not present

## 2017-09-25 DIAGNOSIS — I50810 Right heart failure, unspecified: Secondary | ICD-10-CM | POA: Diagnosis not present

## 2017-09-28 ENCOUNTER — Other Ambulatory Visit: Payer: Self-pay | Admitting: Family Medicine

## 2017-09-28 ENCOUNTER — Ambulatory Visit: Payer: Medicare Other | Admitting: Family Medicine

## 2017-09-28 DIAGNOSIS — S82832D Other fracture of upper and lower end of left fibula, subsequent encounter for closed fracture with routine healing: Secondary | ICD-10-CM | POA: Diagnosis not present

## 2017-09-28 DIAGNOSIS — S82142D Displaced bicondylar fracture of left tibia, subsequent encounter for closed fracture with routine healing: Secondary | ICD-10-CM | POA: Diagnosis not present

## 2017-09-28 DIAGNOSIS — I50810 Right heart failure, unspecified: Secondary | ICD-10-CM | POA: Diagnosis not present

## 2017-09-28 DIAGNOSIS — S52572D Other intraarticular fracture of lower end of left radius, subsequent encounter for closed fracture with routine healing: Secondary | ICD-10-CM | POA: Diagnosis not present

## 2017-09-28 DIAGNOSIS — M109 Gout, unspecified: Secondary | ICD-10-CM

## 2017-09-28 DIAGNOSIS — I110 Hypertensive heart disease with heart failure: Secondary | ICD-10-CM | POA: Diagnosis not present

## 2017-09-28 DIAGNOSIS — I5042 Chronic combined systolic (congestive) and diastolic (congestive) heart failure: Secondary | ICD-10-CM | POA: Diagnosis not present

## 2017-09-28 NOTE — Telephone Encounter (Signed)
Refill request for general medication: Allopurinol 300 mg  Last office visit: 05/13/2017  Last physical exam: None indicated  No Follow-up on file. Appointment was canceled today

## 2017-09-29 DIAGNOSIS — I5042 Chronic combined systolic (congestive) and diastolic (congestive) heart failure: Secondary | ICD-10-CM | POA: Diagnosis not present

## 2017-09-29 DIAGNOSIS — S82832D Other fracture of upper and lower end of left fibula, subsequent encounter for closed fracture with routine healing: Secondary | ICD-10-CM | POA: Diagnosis not present

## 2017-09-29 DIAGNOSIS — I50810 Right heart failure, unspecified: Secondary | ICD-10-CM | POA: Diagnosis not present

## 2017-09-29 DIAGNOSIS — I110 Hypertensive heart disease with heart failure: Secondary | ICD-10-CM | POA: Diagnosis not present

## 2017-09-29 DIAGNOSIS — S82142D Displaced bicondylar fracture of left tibia, subsequent encounter for closed fracture with routine healing: Secondary | ICD-10-CM | POA: Diagnosis not present

## 2017-09-29 DIAGNOSIS — S52572D Other intraarticular fracture of lower end of left radius, subsequent encounter for closed fracture with routine healing: Secondary | ICD-10-CM | POA: Diagnosis not present

## 2017-10-01 ENCOUNTER — Other Ambulatory Visit: Payer: Self-pay | Admitting: Family Medicine

## 2017-10-01 DIAGNOSIS — S82142D Displaced bicondylar fracture of left tibia, subsequent encounter for closed fracture with routine healing: Secondary | ICD-10-CM | POA: Diagnosis not present

## 2017-10-01 DIAGNOSIS — I110 Hypertensive heart disease with heart failure: Secondary | ICD-10-CM | POA: Diagnosis not present

## 2017-10-01 DIAGNOSIS — S52572D Other intraarticular fracture of lower end of left radius, subsequent encounter for closed fracture with routine healing: Secondary | ICD-10-CM | POA: Diagnosis not present

## 2017-10-01 DIAGNOSIS — S82832D Other fracture of upper and lower end of left fibula, subsequent encounter for closed fracture with routine healing: Secondary | ICD-10-CM | POA: Diagnosis not present

## 2017-10-01 DIAGNOSIS — I50810 Right heart failure, unspecified: Secondary | ICD-10-CM | POA: Diagnosis not present

## 2017-10-01 DIAGNOSIS — I5042 Chronic combined systolic (congestive) and diastolic (congestive) heart failure: Secondary | ICD-10-CM | POA: Diagnosis not present

## 2017-10-01 NOTE — Telephone Encounter (Signed)
Pt needs refill on Trazodone

## 2017-10-01 NOTE — Telephone Encounter (Signed)
Patient calling, states she needs refill sent today. Please advise. Call back [phone]

## 2017-10-02 ENCOUNTER — Other Ambulatory Visit: Payer: Self-pay

## 2017-10-02 DIAGNOSIS — I110 Hypertensive heart disease with heart failure: Secondary | ICD-10-CM | POA: Diagnosis not present

## 2017-10-02 DIAGNOSIS — S82142D Displaced bicondylar fracture of left tibia, subsequent encounter for closed fracture with routine healing: Secondary | ICD-10-CM | POA: Diagnosis not present

## 2017-10-02 DIAGNOSIS — I5042 Chronic combined systolic (congestive) and diastolic (congestive) heart failure: Secondary | ICD-10-CM | POA: Diagnosis not present

## 2017-10-02 DIAGNOSIS — I50810 Right heart failure, unspecified: Secondary | ICD-10-CM | POA: Diagnosis not present

## 2017-10-02 DIAGNOSIS — S82832D Other fracture of upper and lower end of left fibula, subsequent encounter for closed fracture with routine healing: Secondary | ICD-10-CM | POA: Diagnosis not present

## 2017-10-02 DIAGNOSIS — S52572D Other intraarticular fracture of lower end of left radius, subsequent encounter for closed fracture with routine healing: Secondary | ICD-10-CM | POA: Diagnosis not present

## 2017-10-02 MED ORDER — TRAZODONE HCL 50 MG PO TABS: 50.0000 mg | ORAL_TABLET | Freq: Every day | ORAL | 0 refills | Status: DC

## 2017-10-02 NOTE — Telephone Encounter (Signed)
Copied from [place]. Topic: General - Other >> Oct 02, 2017  9:04 AM Carolyn Stare wrote:  Pt is asking for a call back as to why the below med has not been refilled. She said she need the below med   traZODone (DESYREL) 50 MG tablet

## 2017-10-02 NOTE — Telephone Encounter (Addendum)
Patient asked Dr. Ancil Boozer to please call her since she has been unable to keep her appointments with Dr. Ancil Boozer. She had a really bad fall off of her porch and broke several bones on her left side. She had her fall on 07/31/17 and since then has had 2 surgeries at [facility], one for her arm and one for her leg. She is in a wheelchair and is in immobile. Patient is doing OT, PT, home health and unable to get out of her house without 2 people helping her. She is sorry about missing her appointments but has had so much going on. [facility]. She will make an appointment soon as she is well.

## 2017-10-02 NOTE — Telephone Encounter (Signed)
She missed her hospital follow up, I will send 30 days but needs to be seen for more refills.

## 2017-10-05 DIAGNOSIS — I50810 Right heart failure, unspecified: Secondary | ICD-10-CM | POA: Diagnosis not present

## 2017-10-05 DIAGNOSIS — I5042 Chronic combined systolic (congestive) and diastolic (congestive) heart failure: Secondary | ICD-10-CM | POA: Diagnosis not present

## 2017-10-05 DIAGNOSIS — S52572D Other intraarticular fracture of lower end of left radius, subsequent encounter for closed fracture with routine healing: Secondary | ICD-10-CM | POA: Diagnosis not present

## 2017-10-05 DIAGNOSIS — I110 Hypertensive heart disease with heart failure: Secondary | ICD-10-CM | POA: Diagnosis not present

## 2017-10-05 DIAGNOSIS — S82832D Other fracture of upper and lower end of left fibula, subsequent encounter for closed fracture with routine healing: Secondary | ICD-10-CM | POA: Diagnosis not present

## 2017-10-05 DIAGNOSIS — S82142D Displaced bicondylar fracture of left tibia, subsequent encounter for closed fracture with routine healing: Secondary | ICD-10-CM | POA: Diagnosis not present

## 2017-10-05 NOTE — Telephone Encounter (Signed)
Called pt and she explained her condition to me. She also told me that Tiffany called her and told her about her medication was filled.

## 2017-10-06 DIAGNOSIS — Y33XXXD Other specified events, undetermined intent, subsequent encounter: Secondary | ICD-10-CM | POA: Diagnosis not present

## 2017-10-06 DIAGNOSIS — Z87891 Personal history of nicotine dependence: Secondary | ICD-10-CM | POA: Diagnosis not present

## 2017-10-06 DIAGNOSIS — S82142D Displaced bicondylar fracture of left tibia, subsequent encounter for closed fracture with routine healing: Secondary | ICD-10-CM | POA: Diagnosis not present

## 2017-10-06 DIAGNOSIS — M25462 Effusion, left knee: Secondary | ICD-10-CM | POA: Diagnosis not present

## 2017-10-06 DIAGNOSIS — M1712 Unilateral primary osteoarthritis, left knee: Secondary | ICD-10-CM | POA: Diagnosis not present

## 2017-10-07 DIAGNOSIS — I110 Hypertensive heart disease with heart failure: Secondary | ICD-10-CM | POA: Diagnosis not present

## 2017-10-07 DIAGNOSIS — I50810 Right heart failure, unspecified: Secondary | ICD-10-CM | POA: Diagnosis not present

## 2017-10-07 DIAGNOSIS — I5042 Chronic combined systolic (congestive) and diastolic (congestive) heart failure: Secondary | ICD-10-CM | POA: Diagnosis not present

## 2017-10-07 DIAGNOSIS — S82832D Other fracture of upper and lower end of left fibula, subsequent encounter for closed fracture with routine healing: Secondary | ICD-10-CM | POA: Diagnosis not present

## 2017-10-07 DIAGNOSIS — S52572D Other intraarticular fracture of lower end of left radius, subsequent encounter for closed fracture with routine healing: Secondary | ICD-10-CM | POA: Diagnosis not present

## 2017-10-07 DIAGNOSIS — S82142D Displaced bicondylar fracture of left tibia, subsequent encounter for closed fracture with routine healing: Secondary | ICD-10-CM | POA: Diagnosis not present

## 2017-10-08 DIAGNOSIS — S82832D Other fracture of upper and lower end of left fibula, subsequent encounter for closed fracture with routine healing: Secondary | ICD-10-CM | POA: Diagnosis not present

## 2017-10-08 DIAGNOSIS — I50810 Right heart failure, unspecified: Secondary | ICD-10-CM | POA: Diagnosis not present

## 2017-10-08 DIAGNOSIS — I5042 Chronic combined systolic (congestive) and diastolic (congestive) heart failure: Secondary | ICD-10-CM | POA: Diagnosis not present

## 2017-10-08 DIAGNOSIS — I110 Hypertensive heart disease with heart failure: Secondary | ICD-10-CM | POA: Diagnosis not present

## 2017-10-08 DIAGNOSIS — S82142D Displaced bicondylar fracture of left tibia, subsequent encounter for closed fracture with routine healing: Secondary | ICD-10-CM | POA: Diagnosis not present

## 2017-10-08 DIAGNOSIS — S52572D Other intraarticular fracture of lower end of left radius, subsequent encounter for closed fracture with routine healing: Secondary | ICD-10-CM | POA: Diagnosis not present

## 2017-10-09 DIAGNOSIS — I5042 Chronic combined systolic (congestive) and diastolic (congestive) heart failure: Secondary | ICD-10-CM | POA: Diagnosis not present

## 2017-10-09 DIAGNOSIS — S82142D Displaced bicondylar fracture of left tibia, subsequent encounter for closed fracture with routine healing: Secondary | ICD-10-CM | POA: Diagnosis not present

## 2017-10-09 DIAGNOSIS — I110 Hypertensive heart disease with heart failure: Secondary | ICD-10-CM | POA: Diagnosis not present

## 2017-10-09 DIAGNOSIS — I50810 Right heart failure, unspecified: Secondary | ICD-10-CM | POA: Diagnosis not present

## 2017-10-09 DIAGNOSIS — S52572D Other intraarticular fracture of lower end of left radius, subsequent encounter for closed fracture with routine healing: Secondary | ICD-10-CM | POA: Diagnosis not present

## 2017-10-09 DIAGNOSIS — S82832D Other fracture of upper and lower end of left fibula, subsequent encounter for closed fracture with routine healing: Secondary | ICD-10-CM | POA: Diagnosis not present

## 2017-10-12 DIAGNOSIS — S82832D Other fracture of upper and lower end of left fibula, subsequent encounter for closed fracture with routine healing: Secondary | ICD-10-CM | POA: Diagnosis not present

## 2017-10-12 DIAGNOSIS — I110 Hypertensive heart disease with heart failure: Secondary | ICD-10-CM | POA: Diagnosis not present

## 2017-10-12 DIAGNOSIS — I50810 Right heart failure, unspecified: Secondary | ICD-10-CM | POA: Diagnosis not present

## 2017-10-12 DIAGNOSIS — S52572D Other intraarticular fracture of lower end of left radius, subsequent encounter for closed fracture with routine healing: Secondary | ICD-10-CM | POA: Diagnosis not present

## 2017-10-12 DIAGNOSIS — I5042 Chronic combined systolic (congestive) and diastolic (congestive) heart failure: Secondary | ICD-10-CM | POA: Diagnosis not present

## 2017-10-12 DIAGNOSIS — S82142D Displaced bicondylar fracture of left tibia, subsequent encounter for closed fracture with routine healing: Secondary | ICD-10-CM | POA: Diagnosis not present

## 2017-10-13 DIAGNOSIS — I5042 Chronic combined systolic (congestive) and diastolic (congestive) heart failure: Secondary | ICD-10-CM | POA: Diagnosis not present

## 2017-10-13 DIAGNOSIS — I50810 Right heart failure, unspecified: Secondary | ICD-10-CM | POA: Diagnosis not present

## 2017-10-13 DIAGNOSIS — I110 Hypertensive heart disease with heart failure: Secondary | ICD-10-CM | POA: Diagnosis not present

## 2017-10-13 DIAGNOSIS — S82832D Other fracture of upper and lower end of left fibula, subsequent encounter for closed fracture with routine healing: Secondary | ICD-10-CM | POA: Diagnosis not present

## 2017-10-13 DIAGNOSIS — S82142D Displaced bicondylar fracture of left tibia, subsequent encounter for closed fracture with routine healing: Secondary | ICD-10-CM | POA: Diagnosis not present

## 2017-10-13 DIAGNOSIS — S52572D Other intraarticular fracture of lower end of left radius, subsequent encounter for closed fracture with routine healing: Secondary | ICD-10-CM | POA: Diagnosis not present

## 2017-10-14 DIAGNOSIS — I50810 Right heart failure, unspecified: Secondary | ICD-10-CM | POA: Diagnosis not present

## 2017-10-14 DIAGNOSIS — I5042 Chronic combined systolic (congestive) and diastolic (congestive) heart failure: Secondary | ICD-10-CM | POA: Diagnosis not present

## 2017-10-14 DIAGNOSIS — S82142D Displaced bicondylar fracture of left tibia, subsequent encounter for closed fracture with routine healing: Secondary | ICD-10-CM | POA: Diagnosis not present

## 2017-10-14 DIAGNOSIS — I110 Hypertensive heart disease with heart failure: Secondary | ICD-10-CM | POA: Diagnosis not present

## 2017-10-14 DIAGNOSIS — S52572D Other intraarticular fracture of lower end of left radius, subsequent encounter for closed fracture with routine healing: Secondary | ICD-10-CM | POA: Diagnosis not present

## 2017-10-14 DIAGNOSIS — S82832D Other fracture of upper and lower end of left fibula, subsequent encounter for closed fracture with routine healing: Secondary | ICD-10-CM | POA: Diagnosis not present

## 2017-10-15 DIAGNOSIS — I50810 Right heart failure, unspecified: Secondary | ICD-10-CM | POA: Diagnosis not present

## 2017-10-15 DIAGNOSIS — S82142D Displaced bicondylar fracture of left tibia, subsequent encounter for closed fracture with routine healing: Secondary | ICD-10-CM | POA: Diagnosis not present

## 2017-10-15 DIAGNOSIS — I110 Hypertensive heart disease with heart failure: Secondary | ICD-10-CM | POA: Diagnosis not present

## 2017-10-15 DIAGNOSIS — S52572D Other intraarticular fracture of lower end of left radius, subsequent encounter for closed fracture with routine healing: Secondary | ICD-10-CM | POA: Diagnosis not present

## 2017-10-15 DIAGNOSIS — I5042 Chronic combined systolic (congestive) and diastolic (congestive) heart failure: Secondary | ICD-10-CM | POA: Diagnosis not present

## 2017-10-15 DIAGNOSIS — S82832D Other fracture of upper and lower end of left fibula, subsequent encounter for closed fracture with routine healing: Secondary | ICD-10-CM | POA: Diagnosis not present

## 2017-10-16 DIAGNOSIS — Y33XXXD Other specified events, undetermined intent, subsequent encounter: Secondary | ICD-10-CM | POA: Diagnosis not present

## 2017-10-16 DIAGNOSIS — S52572D Other intraarticular fracture of lower end of left radius, subsequent encounter for closed fracture with routine healing: Secondary | ICD-10-CM | POA: Diagnosis not present

## 2017-10-19 DIAGNOSIS — I50810 Right heart failure, unspecified: Secondary | ICD-10-CM | POA: Diagnosis not present

## 2017-10-19 DIAGNOSIS — S52572D Other intraarticular fracture of lower end of left radius, subsequent encounter for closed fracture with routine healing: Secondary | ICD-10-CM | POA: Diagnosis not present

## 2017-10-19 DIAGNOSIS — I5042 Chronic combined systolic (congestive) and diastolic (congestive) heart failure: Secondary | ICD-10-CM | POA: Diagnosis not present

## 2017-10-19 DIAGNOSIS — I110 Hypertensive heart disease with heart failure: Secondary | ICD-10-CM | POA: Diagnosis not present

## 2017-10-19 DIAGNOSIS — S82142D Displaced bicondylar fracture of left tibia, subsequent encounter for closed fracture with routine healing: Secondary | ICD-10-CM | POA: Diagnosis not present

## 2017-10-19 DIAGNOSIS — S82832D Other fracture of upper and lower end of left fibula, subsequent encounter for closed fracture with routine healing: Secondary | ICD-10-CM | POA: Diagnosis not present

## 2017-10-20 DIAGNOSIS — I5042 Chronic combined systolic (congestive) and diastolic (congestive) heart failure: Secondary | ICD-10-CM | POA: Diagnosis not present

## 2017-10-20 DIAGNOSIS — I50810 Right heart failure, unspecified: Secondary | ICD-10-CM | POA: Diagnosis not present

## 2017-10-20 DIAGNOSIS — S82142D Displaced bicondylar fracture of left tibia, subsequent encounter for closed fracture with routine healing: Secondary | ICD-10-CM | POA: Diagnosis not present

## 2017-10-20 DIAGNOSIS — I110 Hypertensive heart disease with heart failure: Secondary | ICD-10-CM | POA: Diagnosis not present

## 2017-10-20 DIAGNOSIS — S82832D Other fracture of upper and lower end of left fibula, subsequent encounter for closed fracture with routine healing: Secondary | ICD-10-CM | POA: Diagnosis not present

## 2017-10-20 DIAGNOSIS — S52572D Other intraarticular fracture of lower end of left radius, subsequent encounter for closed fracture with routine healing: Secondary | ICD-10-CM | POA: Diagnosis not present

## 2017-10-22 DIAGNOSIS — I50810 Right heart failure, unspecified: Secondary | ICD-10-CM | POA: Diagnosis not present

## 2017-10-22 DIAGNOSIS — I110 Hypertensive heart disease with heart failure: Secondary | ICD-10-CM | POA: Diagnosis not present

## 2017-10-22 DIAGNOSIS — S82142D Displaced bicondylar fracture of left tibia, subsequent encounter for closed fracture with routine healing: Secondary | ICD-10-CM | POA: Diagnosis not present

## 2017-10-22 DIAGNOSIS — I5042 Chronic combined systolic (congestive) and diastolic (congestive) heart failure: Secondary | ICD-10-CM | POA: Diagnosis not present

## 2017-10-22 DIAGNOSIS — S52572D Other intraarticular fracture of lower end of left radius, subsequent encounter for closed fracture with routine healing: Secondary | ICD-10-CM | POA: Diagnosis not present

## 2017-10-22 DIAGNOSIS — S82832D Other fracture of upper and lower end of left fibula, subsequent encounter for closed fracture with routine healing: Secondary | ICD-10-CM | POA: Diagnosis not present

## 2017-10-23 DIAGNOSIS — I5042 Chronic combined systolic (congestive) and diastolic (congestive) heart failure: Secondary | ICD-10-CM | POA: Diagnosis not present

## 2017-10-23 DIAGNOSIS — I110 Hypertensive heart disease with heart failure: Secondary | ICD-10-CM | POA: Diagnosis not present

## 2017-10-23 DIAGNOSIS — I10 Essential (primary) hypertension: Secondary | ICD-10-CM | POA: Diagnosis not present

## 2017-10-23 DIAGNOSIS — S82832D Other fracture of upper and lower end of left fibula, subsequent encounter for closed fracture with routine healing: Secondary | ICD-10-CM | POA: Diagnosis not present

## 2017-10-23 DIAGNOSIS — S52572D Other intraarticular fracture of lower end of left radius, subsequent encounter for closed fracture with routine healing: Secondary | ICD-10-CM | POA: Diagnosis not present

## 2017-10-23 DIAGNOSIS — S82142D Displaced bicondylar fracture of left tibia, subsequent encounter for closed fracture with routine healing: Secondary | ICD-10-CM | POA: Diagnosis not present

## 2017-10-23 DIAGNOSIS — I50810 Right heart failure, unspecified: Secondary | ICD-10-CM | POA: Diagnosis not present

## 2017-10-26 DIAGNOSIS — I5042 Chronic combined systolic (congestive) and diastolic (congestive) heart failure: Secondary | ICD-10-CM | POA: Diagnosis not present

## 2017-10-26 DIAGNOSIS — S82832D Other fracture of upper and lower end of left fibula, subsequent encounter for closed fracture with routine healing: Secondary | ICD-10-CM | POA: Diagnosis not present

## 2017-10-26 DIAGNOSIS — I50810 Right heart failure, unspecified: Secondary | ICD-10-CM | POA: Diagnosis not present

## 2017-10-26 DIAGNOSIS — I110 Hypertensive heart disease with heart failure: Secondary | ICD-10-CM | POA: Diagnosis not present

## 2017-10-26 DIAGNOSIS — S52572D Other intraarticular fracture of lower end of left radius, subsequent encounter for closed fracture with routine healing: Secondary | ICD-10-CM | POA: Diagnosis not present

## 2017-10-26 DIAGNOSIS — S82142D Displaced bicondylar fracture of left tibia, subsequent encounter for closed fracture with routine healing: Secondary | ICD-10-CM | POA: Diagnosis not present

## 2017-10-27 DIAGNOSIS — S82832D Other fracture of upper and lower end of left fibula, subsequent encounter for closed fracture with routine healing: Secondary | ICD-10-CM | POA: Diagnosis not present

## 2017-10-27 DIAGNOSIS — S52572D Other intraarticular fracture of lower end of left radius, subsequent encounter for closed fracture with routine healing: Secondary | ICD-10-CM | POA: Diagnosis not present

## 2017-10-27 DIAGNOSIS — I5042 Chronic combined systolic (congestive) and diastolic (congestive) heart failure: Secondary | ICD-10-CM | POA: Diagnosis not present

## 2017-10-27 DIAGNOSIS — I110 Hypertensive heart disease with heart failure: Secondary | ICD-10-CM | POA: Diagnosis not present

## 2017-10-27 DIAGNOSIS — I50810 Right heart failure, unspecified: Secondary | ICD-10-CM | POA: Diagnosis not present

## 2017-10-27 DIAGNOSIS — S82142D Displaced bicondylar fracture of left tibia, subsequent encounter for closed fracture with routine healing: Secondary | ICD-10-CM | POA: Diagnosis not present

## 2017-10-29 DIAGNOSIS — I110 Hypertensive heart disease with heart failure: Secondary | ICD-10-CM | POA: Diagnosis not present

## 2017-10-29 DIAGNOSIS — S52572D Other intraarticular fracture of lower end of left radius, subsequent encounter for closed fracture with routine healing: Secondary | ICD-10-CM | POA: Diagnosis not present

## 2017-10-29 DIAGNOSIS — I50810 Right heart failure, unspecified: Secondary | ICD-10-CM | POA: Diagnosis not present

## 2017-10-29 DIAGNOSIS — I5042 Chronic combined systolic (congestive) and diastolic (congestive) heart failure: Secondary | ICD-10-CM | POA: Diagnosis not present

## 2017-10-29 DIAGNOSIS — S82142D Displaced bicondylar fracture of left tibia, subsequent encounter for closed fracture with routine healing: Secondary | ICD-10-CM | POA: Diagnosis not present

## 2017-10-29 DIAGNOSIS — S82832D Other fracture of upper and lower end of left fibula, subsequent encounter for closed fracture with routine healing: Secondary | ICD-10-CM | POA: Diagnosis not present

## 2017-10-30 DIAGNOSIS — S82142D Displaced bicondylar fracture of left tibia, subsequent encounter for closed fracture with routine healing: Secondary | ICD-10-CM | POA: Diagnosis not present

## 2017-10-30 DIAGNOSIS — S52572D Other intraarticular fracture of lower end of left radius, subsequent encounter for closed fracture with routine healing: Secondary | ICD-10-CM | POA: Diagnosis not present

## 2017-10-30 DIAGNOSIS — I5042 Chronic combined systolic (congestive) and diastolic (congestive) heart failure: Secondary | ICD-10-CM | POA: Diagnosis not present

## 2017-10-30 DIAGNOSIS — S82832D Other fracture of upper and lower end of left fibula, subsequent encounter for closed fracture with routine healing: Secondary | ICD-10-CM | POA: Diagnosis not present

## 2017-10-30 DIAGNOSIS — I50810 Right heart failure, unspecified: Secondary | ICD-10-CM | POA: Diagnosis not present

## 2017-10-30 DIAGNOSIS — I110 Hypertensive heart disease with heart failure: Secondary | ICD-10-CM | POA: Diagnosis not present

## 2017-11-02 DIAGNOSIS — I5042 Chronic combined systolic (congestive) and diastolic (congestive) heart failure: Secondary | ICD-10-CM | POA: Diagnosis not present

## 2017-11-02 DIAGNOSIS — I110 Hypertensive heart disease with heart failure: Secondary | ICD-10-CM | POA: Diagnosis not present

## 2017-11-02 DIAGNOSIS — S52572D Other intraarticular fracture of lower end of left radius, subsequent encounter for closed fracture with routine healing: Secondary | ICD-10-CM | POA: Diagnosis not present

## 2017-11-02 DIAGNOSIS — I50810 Right heart failure, unspecified: Secondary | ICD-10-CM | POA: Diagnosis not present

## 2017-11-02 DIAGNOSIS — S82142D Displaced bicondylar fracture of left tibia, subsequent encounter for closed fracture with routine healing: Secondary | ICD-10-CM | POA: Diagnosis not present

## 2017-11-02 DIAGNOSIS — S82832D Other fracture of upper and lower end of left fibula, subsequent encounter for closed fracture with routine healing: Secondary | ICD-10-CM | POA: Diagnosis not present

## 2017-11-03 DIAGNOSIS — I5042 Chronic combined systolic (congestive) and diastolic (congestive) heart failure: Secondary | ICD-10-CM | POA: Diagnosis not present

## 2017-11-03 DIAGNOSIS — S82142D Displaced bicondylar fracture of left tibia, subsequent encounter for closed fracture with routine healing: Secondary | ICD-10-CM | POA: Diagnosis not present

## 2017-11-03 DIAGNOSIS — S52572D Other intraarticular fracture of lower end of left radius, subsequent encounter for closed fracture with routine healing: Secondary | ICD-10-CM | POA: Diagnosis not present

## 2017-11-03 DIAGNOSIS — I110 Hypertensive heart disease with heart failure: Secondary | ICD-10-CM | POA: Diagnosis not present

## 2017-11-03 DIAGNOSIS — S82832D Other fracture of upper and lower end of left fibula, subsequent encounter for closed fracture with routine healing: Secondary | ICD-10-CM | POA: Diagnosis not present

## 2017-11-03 DIAGNOSIS — I50810 Right heart failure, unspecified: Secondary | ICD-10-CM | POA: Diagnosis not present

## 2017-11-06 DIAGNOSIS — S82832D Other fracture of upper and lower end of left fibula, subsequent encounter for closed fracture with routine healing: Secondary | ICD-10-CM | POA: Diagnosis not present

## 2017-11-06 DIAGNOSIS — I50810 Right heart failure, unspecified: Secondary | ICD-10-CM | POA: Diagnosis not present

## 2017-11-06 DIAGNOSIS — I5042 Chronic combined systolic (congestive) and diastolic (congestive) heart failure: Secondary | ICD-10-CM | POA: Diagnosis not present

## 2017-11-06 DIAGNOSIS — S52572D Other intraarticular fracture of lower end of left radius, subsequent encounter for closed fracture with routine healing: Secondary | ICD-10-CM | POA: Diagnosis not present

## 2017-11-06 DIAGNOSIS — S82142D Displaced bicondylar fracture of left tibia, subsequent encounter for closed fracture with routine healing: Secondary | ICD-10-CM | POA: Diagnosis not present

## 2017-11-06 DIAGNOSIS — I110 Hypertensive heart disease with heart failure: Secondary | ICD-10-CM | POA: Diagnosis not present

## 2017-11-09 DIAGNOSIS — I110 Hypertensive heart disease with heart failure: Secondary | ICD-10-CM | POA: Diagnosis not present

## 2017-11-09 DIAGNOSIS — I5042 Chronic combined systolic (congestive) and diastolic (congestive) heart failure: Secondary | ICD-10-CM | POA: Diagnosis not present

## 2017-11-09 DIAGNOSIS — S52572D Other intraarticular fracture of lower end of left radius, subsequent encounter for closed fracture with routine healing: Secondary | ICD-10-CM | POA: Diagnosis not present

## 2017-11-09 DIAGNOSIS — I50810 Right heart failure, unspecified: Secondary | ICD-10-CM | POA: Diagnosis not present

## 2017-11-09 DIAGNOSIS — S82832D Other fracture of upper and lower end of left fibula, subsequent encounter for closed fracture with routine healing: Secondary | ICD-10-CM | POA: Diagnosis not present

## 2017-11-09 DIAGNOSIS — S82142D Displaced bicondylar fracture of left tibia, subsequent encounter for closed fracture with routine healing: Secondary | ICD-10-CM | POA: Diagnosis not present

## 2017-11-10 DIAGNOSIS — I5042 Chronic combined systolic (congestive) and diastolic (congestive) heart failure: Secondary | ICD-10-CM | POA: Diagnosis not present

## 2017-11-10 DIAGNOSIS — S82832D Other fracture of upper and lower end of left fibula, subsequent encounter for closed fracture with routine healing: Secondary | ICD-10-CM | POA: Diagnosis not present

## 2017-11-10 DIAGNOSIS — I50810 Right heart failure, unspecified: Secondary | ICD-10-CM | POA: Diagnosis not present

## 2017-11-10 DIAGNOSIS — I110 Hypertensive heart disease with heart failure: Secondary | ICD-10-CM | POA: Diagnosis not present

## 2017-11-10 DIAGNOSIS — S82142D Displaced bicondylar fracture of left tibia, subsequent encounter for closed fracture with routine healing: Secondary | ICD-10-CM | POA: Diagnosis not present

## 2017-11-10 DIAGNOSIS — S52572D Other intraarticular fracture of lower end of left radius, subsequent encounter for closed fracture with routine healing: Secondary | ICD-10-CM | POA: Diagnosis not present

## 2017-11-12 DIAGNOSIS — I110 Hypertensive heart disease with heart failure: Secondary | ICD-10-CM | POA: Diagnosis not present

## 2017-11-12 DIAGNOSIS — I50810 Right heart failure, unspecified: Secondary | ICD-10-CM | POA: Diagnosis not present

## 2017-11-12 DIAGNOSIS — S82832D Other fracture of upper and lower end of left fibula, subsequent encounter for closed fracture with routine healing: Secondary | ICD-10-CM | POA: Diagnosis not present

## 2017-11-12 DIAGNOSIS — I5042 Chronic combined systolic (congestive) and diastolic (congestive) heart failure: Secondary | ICD-10-CM | POA: Diagnosis not present

## 2017-11-12 DIAGNOSIS — S82142D Displaced bicondylar fracture of left tibia, subsequent encounter for closed fracture with routine healing: Secondary | ICD-10-CM | POA: Diagnosis not present

## 2017-11-12 DIAGNOSIS — S52572D Other intraarticular fracture of lower end of left radius, subsequent encounter for closed fracture with routine healing: Secondary | ICD-10-CM | POA: Diagnosis not present

## 2017-11-16 DIAGNOSIS — I5042 Chronic combined systolic (congestive) and diastolic (congestive) heart failure: Secondary | ICD-10-CM | POA: Diagnosis not present

## 2017-11-16 DIAGNOSIS — S82142D Displaced bicondylar fracture of left tibia, subsequent encounter for closed fracture with routine healing: Secondary | ICD-10-CM | POA: Diagnosis not present

## 2017-11-16 DIAGNOSIS — I110 Hypertensive heart disease with heart failure: Secondary | ICD-10-CM | POA: Diagnosis not present

## 2017-11-16 DIAGNOSIS — I50810 Right heart failure, unspecified: Secondary | ICD-10-CM | POA: Diagnosis not present

## 2017-11-16 DIAGNOSIS — S52572D Other intraarticular fracture of lower end of left radius, subsequent encounter for closed fracture with routine healing: Secondary | ICD-10-CM | POA: Diagnosis not present

## 2017-11-16 DIAGNOSIS — S82832D Other fracture of upper and lower end of left fibula, subsequent encounter for closed fracture with routine healing: Secondary | ICD-10-CM | POA: Diagnosis not present

## 2017-11-17 DIAGNOSIS — S82142D Displaced bicondylar fracture of left tibia, subsequent encounter for closed fracture with routine healing: Secondary | ICD-10-CM | POA: Diagnosis not present

## 2017-11-17 DIAGNOSIS — I5042 Chronic combined systolic (congestive) and diastolic (congestive) heart failure: Secondary | ICD-10-CM | POA: Diagnosis not present

## 2017-11-17 DIAGNOSIS — I50810 Right heart failure, unspecified: Secondary | ICD-10-CM | POA: Diagnosis not present

## 2017-11-17 DIAGNOSIS — S52572D Other intraarticular fracture of lower end of left radius, subsequent encounter for closed fracture with routine healing: Secondary | ICD-10-CM | POA: Diagnosis not present

## 2017-11-17 DIAGNOSIS — S82832D Other fracture of upper and lower end of left fibula, subsequent encounter for closed fracture with routine healing: Secondary | ICD-10-CM | POA: Diagnosis not present

## 2017-11-17 DIAGNOSIS — I110 Hypertensive heart disease with heart failure: Secondary | ICD-10-CM | POA: Diagnosis not present

## 2017-11-19 DIAGNOSIS — S52572D Other intraarticular fracture of lower end of left radius, subsequent encounter for closed fracture with routine healing: Secondary | ICD-10-CM | POA: Diagnosis not present

## 2017-11-19 DIAGNOSIS — I5042 Chronic combined systolic (congestive) and diastolic (congestive) heart failure: Secondary | ICD-10-CM | POA: Diagnosis not present

## 2017-11-19 DIAGNOSIS — S82832D Other fracture of upper and lower end of left fibula, subsequent encounter for closed fracture with routine healing: Secondary | ICD-10-CM | POA: Diagnosis not present

## 2017-11-19 DIAGNOSIS — I110 Hypertensive heart disease with heart failure: Secondary | ICD-10-CM | POA: Diagnosis not present

## 2017-11-19 DIAGNOSIS — S82142D Displaced bicondylar fracture of left tibia, subsequent encounter for closed fracture with routine healing: Secondary | ICD-10-CM | POA: Diagnosis not present

## 2017-11-19 DIAGNOSIS — I50810 Right heart failure, unspecified: Secondary | ICD-10-CM | POA: Diagnosis not present

## 2017-11-20 DIAGNOSIS — S82142D Displaced bicondylar fracture of left tibia, subsequent encounter for closed fracture with routine healing: Secondary | ICD-10-CM | POA: Diagnosis not present

## 2017-11-20 DIAGNOSIS — I5082 Biventricular heart failure: Secondary | ICD-10-CM | POA: Diagnosis not present

## 2017-11-20 DIAGNOSIS — S52572D Other intraarticular fracture of lower end of left radius, subsequent encounter for closed fracture with routine healing: Secondary | ICD-10-CM | POA: Diagnosis not present

## 2017-11-20 DIAGNOSIS — I110 Hypertensive heart disease with heart failure: Secondary | ICD-10-CM | POA: Diagnosis not present

## 2017-11-20 DIAGNOSIS — I5042 Chronic combined systolic (congestive) and diastolic (congestive) heart failure: Secondary | ICD-10-CM | POA: Diagnosis not present

## 2017-11-20 DIAGNOSIS — S82832D Other fracture of upper and lower end of left fibula, subsequent encounter for closed fracture with routine healing: Secondary | ICD-10-CM | POA: Diagnosis not present

## 2017-11-23 DIAGNOSIS — Y33XXXD Other specified events, undetermined intent, subsequent encounter: Secondary | ICD-10-CM | POA: Diagnosis not present

## 2017-11-23 DIAGNOSIS — S52572D Other intraarticular fracture of lower end of left radius, subsequent encounter for closed fracture with routine healing: Secondary | ICD-10-CM | POA: Diagnosis not present

## 2017-11-23 DIAGNOSIS — M1812 Unilateral primary osteoarthritis of first carpometacarpal joint, left hand: Secondary | ICD-10-CM | POA: Diagnosis not present

## 2017-11-23 DIAGNOSIS — M85832 Other specified disorders of bone density and structure, left forearm: Secondary | ICD-10-CM | POA: Diagnosis not present

## 2017-11-24 DIAGNOSIS — S52502A Unspecified fracture of the lower end of left radius, initial encounter for closed fracture: Secondary | ICD-10-CM | POA: Diagnosis not present

## 2017-11-24 DIAGNOSIS — S82102D Unspecified fracture of upper end of left tibia, subsequent encounter for closed fracture with routine healing: Secondary | ICD-10-CM | POA: Diagnosis not present

## 2017-11-24 DIAGNOSIS — Z87891 Personal history of nicotine dependence: Secondary | ICD-10-CM | POA: Diagnosis not present

## 2017-11-24 DIAGNOSIS — S62015D Nondisplaced fracture of distal pole of navicular [scaphoid] bone of left wrist, subsequent encounter for fracture with routine healing: Secondary | ICD-10-CM | POA: Diagnosis not present

## 2017-11-24 DIAGNOSIS — X58XXXA Exposure to other specified factors, initial encounter: Secondary | ICD-10-CM | POA: Diagnosis not present

## 2017-11-24 DIAGNOSIS — S82142D Displaced bicondylar fracture of left tibia, subsequent encounter for closed fracture with routine healing: Secondary | ICD-10-CM | POA: Diagnosis not present

## 2017-11-24 DIAGNOSIS — M85862 Other specified disorders of bone density and structure, left lower leg: Secondary | ICD-10-CM | POA: Diagnosis not present

## 2017-11-24 DIAGNOSIS — S82832D Other fracture of upper and lower end of left fibula, subsequent encounter for closed fracture with routine healing: Secondary | ICD-10-CM | POA: Diagnosis not present

## 2017-11-24 DIAGNOSIS — S52602D Unspecified fracture of lower end of left ulna, subsequent encounter for closed fracture with routine healing: Secondary | ICD-10-CM | POA: Diagnosis not present

## 2017-11-24 DIAGNOSIS — S62185D Nondisplaced fracture of trapezoid [smaller multangular], left wrist, subsequent encounter for fracture with routine healing: Secondary | ICD-10-CM | POA: Diagnosis not present

## 2017-11-24 DIAGNOSIS — S52572D Other intraarticular fracture of lower end of left radius, subsequent encounter for closed fracture with routine healing: Secondary | ICD-10-CM | POA: Diagnosis not present

## 2017-11-24 DIAGNOSIS — S52502D Unspecified fracture of the lower end of left radius, subsequent encounter for closed fracture with routine healing: Secondary | ICD-10-CM | POA: Diagnosis not present

## 2017-11-24 DIAGNOSIS — Y33XXXD Other specified events, undetermined intent, subsequent encounter: Secondary | ICD-10-CM | POA: Diagnosis not present

## 2017-11-25 DIAGNOSIS — I110 Hypertensive heart disease with heart failure: Secondary | ICD-10-CM | POA: Diagnosis not present

## 2017-11-25 DIAGNOSIS — I5042 Chronic combined systolic (congestive) and diastolic (congestive) heart failure: Secondary | ICD-10-CM | POA: Diagnosis not present

## 2017-11-25 DIAGNOSIS — S82142D Displaced bicondylar fracture of left tibia, subsequent encounter for closed fracture with routine healing: Secondary | ICD-10-CM | POA: Diagnosis not present

## 2017-11-25 DIAGNOSIS — S52572D Other intraarticular fracture of lower end of left radius, subsequent encounter for closed fracture with routine healing: Secondary | ICD-10-CM | POA: Diagnosis not present

## 2017-11-25 DIAGNOSIS — S82832D Other fracture of upper and lower end of left fibula, subsequent encounter for closed fracture with routine healing: Secondary | ICD-10-CM | POA: Diagnosis not present

## 2017-11-25 DIAGNOSIS — I5082 Biventricular heart failure: Secondary | ICD-10-CM | POA: Diagnosis not present

## 2017-11-27 DIAGNOSIS — I110 Hypertensive heart disease with heart failure: Secondary | ICD-10-CM | POA: Diagnosis not present

## 2017-11-27 DIAGNOSIS — Z952 Presence of prosthetic heart valve: Secondary | ICD-10-CM | POA: Diagnosis not present

## 2017-11-27 DIAGNOSIS — Q231 Congenital insufficiency of aortic valve: Secondary | ICD-10-CM | POA: Diagnosis not present

## 2017-11-27 DIAGNOSIS — I10 Essential (primary) hypertension: Secondary | ICD-10-CM | POA: Diagnosis not present

## 2017-11-27 DIAGNOSIS — C50919 Malignant neoplasm of unspecified site of unspecified female breast: Secondary | ICD-10-CM | POA: Diagnosis not present

## 2017-11-27 DIAGNOSIS — S82832D Other fracture of upper and lower end of left fibula, subsequent encounter for closed fracture with routine healing: Secondary | ICD-10-CM | POA: Diagnosis not present

## 2017-11-27 DIAGNOSIS — I509 Heart failure, unspecified: Secondary | ICD-10-CM | POA: Diagnosis not present

## 2017-11-27 DIAGNOSIS — S82142D Displaced bicondylar fracture of left tibia, subsequent encounter for closed fracture with routine healing: Secondary | ICD-10-CM | POA: Diagnosis not present

## 2017-11-27 DIAGNOSIS — I5022 Chronic systolic (congestive) heart failure: Secondary | ICD-10-CM | POA: Diagnosis not present

## 2017-11-27 DIAGNOSIS — I5082 Biventricular heart failure: Secondary | ICD-10-CM | POA: Diagnosis not present

## 2017-11-27 DIAGNOSIS — J410 Simple chronic bronchitis: Secondary | ICD-10-CM | POA: Diagnosis not present

## 2017-11-27 DIAGNOSIS — I4891 Unspecified atrial fibrillation: Secondary | ICD-10-CM | POA: Diagnosis not present

## 2017-11-27 DIAGNOSIS — I5042 Chronic combined systolic (congestive) and diastolic (congestive) heart failure: Secondary | ICD-10-CM | POA: Diagnosis not present

## 2017-11-27 DIAGNOSIS — S52572D Other intraarticular fracture of lower end of left radius, subsequent encounter for closed fracture with routine healing: Secondary | ICD-10-CM | POA: Diagnosis not present

## 2017-11-27 DIAGNOSIS — Z01810 Encounter for preprocedural cardiovascular examination: Secondary | ICD-10-CM | POA: Diagnosis not present

## 2017-11-27 DIAGNOSIS — E7800 Pure hypercholesterolemia, unspecified: Secondary | ICD-10-CM | POA: Diagnosis not present

## 2017-11-27 DIAGNOSIS — S52602D Unspecified fracture of lower end of left ulna, subsequent encounter for closed fracture with routine healing: Secondary | ICD-10-CM | POA: Diagnosis not present

## 2017-11-27 DIAGNOSIS — Z9889 Other specified postprocedural states: Secondary | ICD-10-CM | POA: Diagnosis not present

## 2017-11-27 DIAGNOSIS — I350 Nonrheumatic aortic (valve) stenosis: Secondary | ICD-10-CM | POA: Diagnosis not present

## 2017-11-27 DIAGNOSIS — Z7901 Long term (current) use of anticoagulants: Secondary | ICD-10-CM | POA: Diagnosis not present

## 2017-11-27 DIAGNOSIS — S52572A Other intraarticular fracture of lower end of left radius, initial encounter for closed fracture: Secondary | ICD-10-CM | POA: Diagnosis not present

## 2017-11-27 DIAGNOSIS — S52502D Unspecified fracture of the lower end of left radius, subsequent encounter for closed fracture with routine healing: Secondary | ICD-10-CM | POA: Diagnosis not present

## 2017-11-27 DIAGNOSIS — I351 Nonrheumatic aortic (valve) insufficiency: Secondary | ICD-10-CM | POA: Diagnosis not present

## 2017-11-28 ENCOUNTER — Other Ambulatory Visit: Payer: Self-pay | Admitting: Family Medicine

## 2017-11-28 DIAGNOSIS — J440 Chronic obstructive pulmonary disease with acute lower respiratory infection: Secondary | ICD-10-CM

## 2017-11-30 DIAGNOSIS — I5082 Biventricular heart failure: Secondary | ICD-10-CM | POA: Diagnosis not present

## 2017-11-30 DIAGNOSIS — S52572D Other intraarticular fracture of lower end of left radius, subsequent encounter for closed fracture with routine healing: Secondary | ICD-10-CM | POA: Diagnosis not present

## 2017-11-30 DIAGNOSIS — I110 Hypertensive heart disease with heart failure: Secondary | ICD-10-CM | POA: Diagnosis not present

## 2017-11-30 DIAGNOSIS — S82142D Displaced bicondylar fracture of left tibia, subsequent encounter for closed fracture with routine healing: Secondary | ICD-10-CM | POA: Diagnosis not present

## 2017-11-30 DIAGNOSIS — I5042 Chronic combined systolic (congestive) and diastolic (congestive) heart failure: Secondary | ICD-10-CM | POA: Diagnosis not present

## 2017-11-30 DIAGNOSIS — S82832D Other fracture of upper and lower end of left fibula, subsequent encounter for closed fracture with routine healing: Secondary | ICD-10-CM | POA: Diagnosis not present

## 2017-12-01 DIAGNOSIS — I110 Hypertensive heart disease with heart failure: Secondary | ICD-10-CM | POA: Diagnosis not present

## 2017-12-01 DIAGNOSIS — S82832D Other fracture of upper and lower end of left fibula, subsequent encounter for closed fracture with routine healing: Secondary | ICD-10-CM | POA: Diagnosis not present

## 2017-12-01 DIAGNOSIS — S52572D Other intraarticular fracture of lower end of left radius, subsequent encounter for closed fracture with routine healing: Secondary | ICD-10-CM | POA: Diagnosis not present

## 2017-12-01 DIAGNOSIS — S82142D Displaced bicondylar fracture of left tibia, subsequent encounter for closed fracture with routine healing: Secondary | ICD-10-CM | POA: Diagnosis not present

## 2017-12-01 DIAGNOSIS — I5082 Biventricular heart failure: Secondary | ICD-10-CM | POA: Diagnosis not present

## 2017-12-01 DIAGNOSIS — I5042 Chronic combined systolic (congestive) and diastolic (congestive) heart failure: Secondary | ICD-10-CM | POA: Diagnosis not present

## 2017-12-03 DIAGNOSIS — I5082 Biventricular heart failure: Secondary | ICD-10-CM | POA: Diagnosis not present

## 2017-12-03 DIAGNOSIS — S82832D Other fracture of upper and lower end of left fibula, subsequent encounter for closed fracture with routine healing: Secondary | ICD-10-CM | POA: Diagnosis not present

## 2017-12-03 DIAGNOSIS — I110 Hypertensive heart disease with heart failure: Secondary | ICD-10-CM | POA: Diagnosis not present

## 2017-12-03 DIAGNOSIS — I5042 Chronic combined systolic (congestive) and diastolic (congestive) heart failure: Secondary | ICD-10-CM | POA: Diagnosis not present

## 2017-12-03 DIAGNOSIS — S82142D Displaced bicondylar fracture of left tibia, subsequent encounter for closed fracture with routine healing: Secondary | ICD-10-CM | POA: Diagnosis not present

## 2017-12-03 DIAGNOSIS — S52572D Other intraarticular fracture of lower end of left radius, subsequent encounter for closed fracture with routine healing: Secondary | ICD-10-CM | POA: Diagnosis not present

## 2017-12-04 DIAGNOSIS — I5042 Chronic combined systolic (congestive) and diastolic (congestive) heart failure: Secondary | ICD-10-CM | POA: Diagnosis not present

## 2017-12-04 DIAGNOSIS — S52572D Other intraarticular fracture of lower end of left radius, subsequent encounter for closed fracture with routine healing: Secondary | ICD-10-CM | POA: Diagnosis not present

## 2017-12-04 DIAGNOSIS — I5082 Biventricular heart failure: Secondary | ICD-10-CM | POA: Diagnosis not present

## 2017-12-04 DIAGNOSIS — S82832D Other fracture of upper and lower end of left fibula, subsequent encounter for closed fracture with routine healing: Secondary | ICD-10-CM | POA: Diagnosis not present

## 2017-12-04 DIAGNOSIS — S82142D Displaced bicondylar fracture of left tibia, subsequent encounter for closed fracture with routine healing: Secondary | ICD-10-CM | POA: Diagnosis not present

## 2017-12-04 DIAGNOSIS — I110 Hypertensive heart disease with heart failure: Secondary | ICD-10-CM | POA: Diagnosis not present

## 2017-12-07 DIAGNOSIS — I5042 Chronic combined systolic (congestive) and diastolic (congestive) heart failure: Secondary | ICD-10-CM | POA: Diagnosis not present

## 2017-12-07 DIAGNOSIS — S82832D Other fracture of upper and lower end of left fibula, subsequent encounter for closed fracture with routine healing: Secondary | ICD-10-CM | POA: Diagnosis not present

## 2017-12-07 DIAGNOSIS — S82142D Displaced bicondylar fracture of left tibia, subsequent encounter for closed fracture with routine healing: Secondary | ICD-10-CM | POA: Diagnosis not present

## 2017-12-07 DIAGNOSIS — I5082 Biventricular heart failure: Secondary | ICD-10-CM | POA: Diagnosis not present

## 2017-12-07 DIAGNOSIS — S52572D Other intraarticular fracture of lower end of left radius, subsequent encounter for closed fracture with routine healing: Secondary | ICD-10-CM | POA: Diagnosis not present

## 2017-12-07 DIAGNOSIS — I110 Hypertensive heart disease with heart failure: Secondary | ICD-10-CM | POA: Diagnosis not present

## 2017-12-08 DIAGNOSIS — S82832D Other fracture of upper and lower end of left fibula, subsequent encounter for closed fracture with routine healing: Secondary | ICD-10-CM | POA: Diagnosis not present

## 2017-12-08 DIAGNOSIS — I5082 Biventricular heart failure: Secondary | ICD-10-CM | POA: Diagnosis not present

## 2017-12-08 DIAGNOSIS — S82142D Displaced bicondylar fracture of left tibia, subsequent encounter for closed fracture with routine healing: Secondary | ICD-10-CM | POA: Diagnosis not present

## 2017-12-08 DIAGNOSIS — S52572D Other intraarticular fracture of lower end of left radius, subsequent encounter for closed fracture with routine healing: Secondary | ICD-10-CM | POA: Diagnosis not present

## 2017-12-08 DIAGNOSIS — I5042 Chronic combined systolic (congestive) and diastolic (congestive) heart failure: Secondary | ICD-10-CM | POA: Diagnosis not present

## 2017-12-08 DIAGNOSIS — I110 Hypertensive heart disease with heart failure: Secondary | ICD-10-CM | POA: Diagnosis not present

## 2017-12-09 DIAGNOSIS — I509 Heart failure, unspecified: Secondary | ICD-10-CM | POA: Diagnosis not present

## 2017-12-09 DIAGNOSIS — I4891 Unspecified atrial fibrillation: Secondary | ICD-10-CM | POA: Diagnosis not present

## 2017-12-09 DIAGNOSIS — E669 Obesity, unspecified: Secondary | ICD-10-CM | POA: Diagnosis not present

## 2017-12-09 DIAGNOSIS — Z87891 Personal history of nicotine dependence: Secondary | ICD-10-CM | POA: Diagnosis not present

## 2017-12-09 DIAGNOSIS — Z888 Allergy status to other drugs, medicaments and biological substances status: Secondary | ICD-10-CM | POA: Diagnosis not present

## 2017-12-09 DIAGNOSIS — Z881 Allergy status to other antibiotic agents status: Secondary | ICD-10-CM | POA: Diagnosis not present

## 2017-12-09 DIAGNOSIS — S52572D Other intraarticular fracture of lower end of left radius, subsequent encounter for closed fracture with routine healing: Secondary | ICD-10-CM | POA: Diagnosis not present

## 2017-12-09 DIAGNOSIS — I5022 Chronic systolic (congestive) heart failure: Secondary | ICD-10-CM | POA: Diagnosis not present

## 2017-12-09 DIAGNOSIS — I272 Pulmonary hypertension, unspecified: Secondary | ICD-10-CM | POA: Diagnosis not present

## 2017-12-09 DIAGNOSIS — I110 Hypertensive heart disease with heart failure: Secondary | ICD-10-CM | POA: Diagnosis not present

## 2017-12-09 DIAGNOSIS — Z853 Personal history of malignant neoplasm of breast: Secondary | ICD-10-CM | POA: Diagnosis not present

## 2017-12-09 DIAGNOSIS — Z472 Encounter for removal of internal fixation device: Secondary | ICD-10-CM | POA: Diagnosis not present

## 2017-12-09 DIAGNOSIS — Z79899 Other long term (current) drug therapy: Secondary | ICD-10-CM | POA: Diagnosis not present

## 2017-12-09 DIAGNOSIS — Z9889 Other specified postprocedural states: Secondary | ICD-10-CM | POA: Diagnosis not present

## 2017-12-09 DIAGNOSIS — J449 Chronic obstructive pulmonary disease, unspecified: Secondary | ICD-10-CM | POA: Diagnosis not present

## 2017-12-09 DIAGNOSIS — I429 Cardiomyopathy, unspecified: Secondary | ICD-10-CM | POA: Diagnosis not present

## 2017-12-09 DIAGNOSIS — Z6829 Body mass index (BMI) 29.0-29.9, adult: Secondary | ICD-10-CM | POA: Diagnosis not present

## 2017-12-09 DIAGNOSIS — Z7901 Long term (current) use of anticoagulants: Secondary | ICD-10-CM | POA: Diagnosis not present

## 2017-12-09 DIAGNOSIS — Z885 Allergy status to narcotic agent status: Secondary | ICD-10-CM | POA: Diagnosis not present

## 2017-12-09 DIAGNOSIS — Z880 Allergy status to penicillin: Secondary | ICD-10-CM | POA: Diagnosis not present

## 2017-12-11 DIAGNOSIS — I110 Hypertensive heart disease with heart failure: Secondary | ICD-10-CM | POA: Diagnosis not present

## 2017-12-11 DIAGNOSIS — S82142D Displaced bicondylar fracture of left tibia, subsequent encounter for closed fracture with routine healing: Secondary | ICD-10-CM | POA: Diagnosis not present

## 2017-12-11 DIAGNOSIS — S82832D Other fracture of upper and lower end of left fibula, subsequent encounter for closed fracture with routine healing: Secondary | ICD-10-CM | POA: Diagnosis not present

## 2017-12-11 DIAGNOSIS — S52572D Other intraarticular fracture of lower end of left radius, subsequent encounter for closed fracture with routine healing: Secondary | ICD-10-CM | POA: Diagnosis not present

## 2017-12-11 DIAGNOSIS — I5082 Biventricular heart failure: Secondary | ICD-10-CM | POA: Diagnosis not present

## 2017-12-11 DIAGNOSIS — I5042 Chronic combined systolic (congestive) and diastolic (congestive) heart failure: Secondary | ICD-10-CM | POA: Diagnosis not present

## 2017-12-14 DIAGNOSIS — I480 Paroxysmal atrial fibrillation: Secondary | ICD-10-CM | POA: Diagnosis not present

## 2017-12-16 ENCOUNTER — Ambulatory Visit: Payer: Medicare Other | Attending: Orthopedic Surgery | Admitting: Physical Therapy

## 2017-12-16 ENCOUNTER — Encounter: Payer: Self-pay | Admitting: Physical Therapy

## 2017-12-16 DIAGNOSIS — S82142E Displaced bicondylar fracture of left tibia, subsequent encounter for open fracture type I or II with routine healing: Secondary | ICD-10-CM | POA: Diagnosis not present

## 2017-12-16 DIAGNOSIS — R262 Difficulty in walking, not elsewhere classified: Secondary | ICD-10-CM

## 2017-12-16 DIAGNOSIS — M6281 Muscle weakness (generalized): Secondary | ICD-10-CM | POA: Diagnosis not present

## 2017-12-16 DIAGNOSIS — M25532 Pain in left wrist: Secondary | ICD-10-CM | POA: Diagnosis not present

## 2017-12-16 DIAGNOSIS — X58XXXD Exposure to other specified factors, subsequent encounter: Secondary | ICD-10-CM | POA: Diagnosis not present

## 2017-12-16 DIAGNOSIS — M25552 Pain in left hip: Secondary | ICD-10-CM | POA: Insufficient documentation

## 2017-12-16 DIAGNOSIS — M25642 Stiffness of left hand, not elsewhere classified: Secondary | ICD-10-CM | POA: Insufficient documentation

## 2017-12-16 DIAGNOSIS — M25632 Stiffness of left wrist, not elsewhere classified: Secondary | ICD-10-CM | POA: Insufficient documentation

## 2017-12-16 NOTE — Therapy (Signed)
[facility] [address] [place], [address] Phone: [phone]   Fax:  [phone]  Physical Therapy Evaluation  Patient Details  Name: Kelly TURNEY MRN: [id number] Date of Birth: [birth year] Referring Provider: Inge Rise, MD   Encounter Date: 12/16/2017  PT End of Session - 12/16/17 1541    Visit Number  1    Number of Visits  17    Date for PT Re-Evaluation  02/10/18    PT Start Time  0237    PT Stop Time  0333    PT Time Calculation (min)  56 min    Activity Tolerance  Patient tolerated treatment well    Behavior During Therapy  [facility] for tasks assessed/performed       Past Medical History:  Diagnosis Date  . Atrial fibrillation ([place])   . Breast cancer ([place]) 1992   positive, radiation  . CHF (congestive heart failure) ([place])   . COPD (chronic obstructive pulmonary disease) ([place])   . History of aortic valve replacement   . Hyperlipidemia     Past Surgical History:  Procedure Laterality Date  . ABDOMINAL HYSTERECTOMY  1996  . BREAST BIOPSY Right 1998   neg  . BREAST BIOPSY Right 2007   neg  . BREAST BIOPSY Right 1992   positive  . BREAST EXCISIONAL BIOPSY Left 2000   neg  . CARDIAC VALVE REPLACEMENT  2014    There were no vitals filed for this visit.   Subjective Assessment - 12/16/17 1454    Subjective  s/p ORIF L tibial plateau fx    Pertinent History  Patient is a 77 y/o female s/p ORIF tibial plateau fx following a fall down 5 steps Nov 2018, surgery Aug 27, 2017. Patient suffered another fall while in [place] preparing for tibial surgery, and fractured the distal L radius.  Patient had ORIF of L radius in Dec 26th. Patient reports she has had therapy in the SNF and at home over the past 4 months. Patient reports she was ambulating with walker with LUE support, and then began ambulating with single crutch at the end of Jan 2019, and has been walking without AD for the past 2  weeks. Patient reports she finished home PT last Friday and reports she has been "exercising her legs and walking" with home health PT. Patient reports worst pain in the past week: 1/10 which only comes on when she is getting into bed best: 0/10    Limitations  Lifting;Standing;Walking;House hold activities    How long can you sit comfortably?  unlimited    How long can you stand comfortably?  16min    How long can you walk comfortably?  30 min.    Patient Stated Goals  Wants to be able to go back to work and walk longer without pain    Pain Score  0-No pain    Pain Location  Knee    Pain Orientation  Left    Pain Descriptors / Indicators  Aching;Dull    Pain Type  Acute pain    Pain Onset  More than a month ago    Pain Frequency  Occasional    Aggravating Factors   Getting in to the bed and finding a comfortable position at the end of the night         [facility] PT Assessment - 12/16/17 0001      Assessment   Medical Diagnosis  s/p L tibial  plateau ORIF    Referring Provider  Inge Rise, MD    Onset Date/Surgical Date  08/27/17    Hand Dominance  Right    Next MD Visit  February 23, 2018    Prior Therapy  Yes here, successful      Balance Screen   Has the patient fallen in the past 6 months  Yes    How many times?  2    Has the patient had a decrease in activity level because of a fear of falling?   No    Is the patient reluctant to leave their home because of a fear of falling?   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 Normal SLR without quad lag on R 5xSTS 15sec with 4/10 pain Ambulation with slight L hip drop and antalgic gait Observation of incision is healed with noted scar tissue   Ther-Ex -Bridge Exercise 3x 10 with cuing for glute contraction and eccentric control; patient able to complete 75% ROM -Supine SLR 3x 10 with slight quad lag noted on lowering phase which patient is able to correct 50% with PT cuing for quad contraction/knee ext with lowering -L standing quad stretch 1x 30sec hold each side -Scoot stretch 3x 15sec holds              Objective measurements completed on examination: See above findings.              PT Education - 12/16/17 1540    Education provided  Yes    Education Details  Patient was educated on diagnosis, anatomy and pathology involved, prognosis, role of PT, and was given an HEP, demonstrating exercise with proper form following verbal and tactile cues, and was given a paper hand out to continue exercise at home. Pt was educated on and agreed to plan of care.    Person(s) Educated  Patient    Methods  Explanation;Demonstration;Tactile cues;Verbal cues    Comprehension  Verbal cues required;Tactile cues required;Returned demonstration;Verbalized understanding       PT Short Term Goals - 12/16/17 1551      PT SHORT TERM GOAL #1   Title  Pt will be independent with HEP in  order to improve strength and balance in order to decrease fall risk and improve  function at home and work.    Time  2    Period  Weeks        PT Long Term Goals - 12/16/17 1552      PT LONG TERM GOAL #1   Title  Pt will increase LEFS by at least 9 points in order to demonstrate significant improvement in lower extremity function.[facility]    Baseline  4/10 42%    Time  8    Period  Weeks    Status  New      PT LONG TERM GOAL #2   Title  Patient will increase FOTO score to 55 to demonstrate predicted increase in functional mobility to complete ADLs    Baseline  4/10 42    Time  8    Period  Weeks    Status  New      PT LONG TERM GOAL #3   Title  Pt will decrease 5TSTS by at least 3 seconds in order to demonstrate clinically significant improvement in LE strength    Baseline  4/10 15sec    Time  8    Period  Weeks    Status  New      PT LONG TERM GOAL #4   Title   Pt will increase 6MWT by at least 35m (169ft) in order to demonstrate clinically significant improvement in cardiopulmonary endurance and community ambulation    Baseline  To be done next visit    Time  8    Period  Weeks    Status  New             Plan - 12/16/17 1548    Clinical Impression Statement  Pt is a 77 year old female with L tibial plateau ORIF necessitated following fracture following fall 07/2017, surgical repair 08/27/18; with current activity limitations in walking, prolonged standing, squatting, and stair ambulation. Current impairments including L lower leg pain, decreased A/PROM, and decreased strength. Participation restrictions: work duties in [employer], Pt will benefit from skilled PT intervention to address the aforementioned impairments and activity limitations for best return to PLOF.    History and Personal Factors relevant to plan of care:  2 personal factors/comorbidities, 3 body systems/activity limitations/participation restrictions     Clinical Presentation  Evolving    Clinical Presentation due to:  additional surgical sites    Clinical Decision  Making  Moderate    Rehab Potential  Good    Clinical Impairments Affecting Rehab Potential  (+) acute condition, motivated  (-) chronic condition    PT Frequency  2x / week    PT Duration  8 weeks    PT Treatment/Interventions  Iontophoresis 4mg /ml Dexamethasone;Patient/family education;Electrical Stimulation;Cryotherapy;Neuromuscular re-education;Manual techniques;Therapeutic exercise;Ultrasound;Moist Heat;Gait training;Passive range of motion;Therapeutic activities;Balance training    PT Next Visit Plan  6MWT, HEP review    PT Home Exercise Plan  bridge exercise, SLR, scoot stretch, standing quad stretch    Consulted and Agree with Plan of Care  Patient       Patient will benefit from skilled therapeutic intervention in order to improve the following deficits and impairments:  Abnormal gait, Increased fascial restricitons, Pain, Improper body mechanics, Postural dysfunction, Decreased endurance, Decreased range of motion, Decreased activity tolerance, Decreased strength, Difficulty walking, Impaired flexibility  Visit Diagnosis: Type I or II open fracture of left tibial plateau with routine healing, subsequent encounter  Difficulty in walking, not elsewhere  classified  Muscle weakness (generalized)     Problem List Patient Active Problem List   Diagnosis Date Noted  . Tibial plateau fracture 07/31/2017  . Chronic systolic heart failure ([place]) 10/08/2015  . Chronic obstructive pulmonary disease ([place]) 07/04/2015  . Controlled gout 06/06/2015  . Anxiety 02/09/2015  . Synovial cyst of popliteal space 02/09/2015  . Benign hypertension 02/09/2015  . Blood type A+ 02/09/2015  . Arterial branch occlusion of retina 02/09/2015  . Chronic constipation 02/09/2015  . Insomnia, persistent 02/09/2015  . Chronic combined systolic and diastolic CHF, NYHA class 1 ([place]) 02/09/2015  . Compromised kidney function 02/09/2015  . Dyslipidemia 02/09/2015  . Atrial fibrillation ([place]) 02/09/2015  .  Arthritis urica 02/09/2015  . Hearing loss 02/09/2015  . History of open heart surgery 02/09/2015  . Chronic hoarseness 02/09/2015  . Cardiomegaly 02/09/2015  . Dysmetabolic syndrome 47/65/4650  . Migraine with aura and without status migrainosus, not intractable 02/09/2015  . Adult BMI 30+ 02/09/2015  . Hypertensive pulmonary vascular disease ([place]) 02/09/2015  . Allergic rhinitis, seasonal 02/09/2015  . Vitamin D deficiency 02/09/2015  . History of mitral valve repair 10/01/2012  . History of aortic valve replacement 10/01/2012  . Congestive heart failure ([place]) 10/01/2012    Shelton Silvas 12/16/2017, 3:58 PM  [facility] [address] [place], [address] Phone: [phone]   Fax:  [phone]  Name: SUELLA COGAR MRN: [id number] Date of Birth: 1941/07/22

## 2017-12-21 ENCOUNTER — Encounter: Payer: Self-pay | Admitting: Physical Therapy

## 2017-12-21 ENCOUNTER — Ambulatory Visit: Payer: Medicare Other | Admitting: Physical Therapy

## 2017-12-21 DIAGNOSIS — M6281 Muscle weakness (generalized): Secondary | ICD-10-CM | POA: Diagnosis not present

## 2017-12-21 DIAGNOSIS — M25642 Stiffness of left hand, not elsewhere classified: Secondary | ICD-10-CM | POA: Diagnosis not present

## 2017-12-21 DIAGNOSIS — M25632 Stiffness of left wrist, not elsewhere classified: Secondary | ICD-10-CM | POA: Diagnosis not present

## 2017-12-21 DIAGNOSIS — M25552 Pain in left hip: Secondary | ICD-10-CM | POA: Diagnosis not present

## 2017-12-21 DIAGNOSIS — S82142E Displaced bicondylar fracture of left tibia, subsequent encounter for open fracture type I or II with routine healing: Secondary | ICD-10-CM

## 2017-12-21 DIAGNOSIS — R262 Difficulty in walking, not elsewhere classified: Secondary | ICD-10-CM | POA: Diagnosis not present

## 2017-12-21 NOTE — Therapy (Signed)
[facility] [address], [place], [address] Phone: [phone]   Fax:  [phone]  Physical Therapy Treatment  Patient Details  Name: [name] MRN: [id number] Date of Birth: 05-03-1941 Referring Provider: Inge Rise, MD   Encounter Date: 12/21/2017  PT End of Session - 12/21/17 1425    Visit Number  2    Number of Visits  17    Date for PT Re-Evaluation  02/10/18    PT Start Time  0145    PT Stop Time  0235    PT Time Calculation (min)  50 min    Activity Tolerance  Patient tolerated treatment well    Behavior During Therapy  [facility] for tasks assessed/performed       Past Medical History:  Diagnosis Date  . Atrial fibrillation ([place])   . Breast cancer ([place]) 1992   positive, radiation  . CHF (congestive heart failure) ([place])   . COPD (chronic obstructive pulmonary disease) ([place])   . History of aortic valve replacement   . Hyperlipidemia     Past Surgical History:  Procedure Laterality Date  . ABDOMINAL HYSTERECTOMY  1996  . BREAST BIOPSY Right 1998   neg  . BREAST BIOPSY Right 2007   neg  . BREAST BIOPSY Right 1992   positive  . BREAST EXCISIONAL BIOPSY Left 2000   neg  . CARDIAC VALVE REPLACEMENT  2014    There were no vitals filed for this visit.  Subjective Assessment - 12/21/17 1347    Subjective  Patient reports she has had no pain just some "stiffness" over the weekend. She reports she is having some swelling today that she reports she has had since surgery, but is exacerbated today. Patient reports as soon as she gets up the swelling begins and stops when she elevates the R LE or at rest. She reports he PCP has her on lasix for this swelling. Patient reports diligence with HEP.     Pertinent History  Patient is a 77 y/o female s/p L ORIF tibial plateau fx following a fall down 5 steps Nov 2018, surgery Aug 27, 2017. Patient suffered another fall while in [place] preparing  for tibial surgery, and fractured the distal L radius.  Patient had ORIF of L radius in Dec 26th. Patient reports she has had therapy in the SNF and at home over the past 4 months. Patient reports she was ambulating with walker with LUE support, and then began ambulating with single crutch at the end of Jan 2019, and has been walking without AD for the past 2 weeks. Patient reports she finished home PT last Friday and reports she has been "exercising her legs and walking" with home health PT. Patient reports worst pain in the past week: 1/10 which only comes on when she is getting into bed best: 0/10    Limitations  Lifting;Standing;Walking;House hold activities    How long can you sit comfortably?  unlimited    How long can you stand comfortably?  14min    How long can you walk comfortably?  30 min.    Patient Stated Goals  Wants to be able to go back to work and walk longer without pain    Pain Onset  More than a month ago       Ther-Ex -Nustep 7 min during hx intake - Bridge exercise 3x 12 with min cuing for eccentric control - L SLR 3x 12 w/ min  cuing for eccentric control and to prevent quad lag - Standing L quad stretch 3x 30sec holds with min cuing needed for form -Supine quad stretch with PT overpressure 3x 30sec holds -L prone hip ext 3x 10 with cuing for eccentric lowering -Blue tband DF 3x 10 with cuing for eccentric control  -Mini squat from elevated mat table 3x 10 with cuing for full hip ext with cuing and cuing to prevent "knees over toes" to maintain weight on heels -Heel walking 62ft 3 trials with cuing to maintain                       PT Education - 12/21/17 1425    Education provided  Yes    Education Details  Exercise form    Person(s) Educated  Patient    Methods  Explanation;Demonstration;Tactile cues;Verbal cues;Handout    Comprehension  Verbal cues required;Tactile cues required;Returned demonstration;Verbalized understanding       PT Short  Term Goals - 12/16/17 1551      PT SHORT TERM GOAL #1   Title  Pt will be independent with HEP in order to improve strength and balance in order to decrease fall risk and improve function at home and work.    Time  2    Period  Weeks        PT Long Term Goals - 12/16/17 1552      PT LONG TERM GOAL #1   Title  Pt will increase LEFS by at least 9 points in order to demonstrate significant improvement in lower extremity function.[facility]    Baseline  4/10 42%    Time  8    Period  Weeks    Status  New      PT LONG TERM GOAL #2   Title  Patient will increase FOTO score to 55 to demonstrate predicted increase in functional mobility to complete ADLs    Baseline  4/10 42    Time  8    Period  Weeks    Status  New      PT LONG TERM GOAL #3   Title  Pt will decrease 5TSTS by at least 3 seconds in order to demonstrate clinically significant improvement in LE strength    Baseline  4/10 15sec    Time  8    Period  Weeks    Status  New      PT LONG TERM GOAL #4   Title   Pt will increase 6MWT by at least 6m (140ft) in order to demonstrate clinically significant improvement in cardiopulmonary endurance and community ambulation    Baseline  To be done next visit    Time  8    Period  Weeks    Status  New            Plan - 12/21/17 1430    Clinical Impression Statement  Patient tolerated therex progression well with no noted pain, only fatigue. Patient is still having tightness in R hip flexor, which resolves fairly quickly with stretching. Following warm up and stretching patient reports she feels like she is "looser" and has a noteably less antalgic gait. PT reviewed HEP with patient to ensure understanding and had to spend prolonged time on standing quad stretch to ensure proper form, but patient was able to demonstrate and verbalize understanding.     Rehab Potential  Good    Clinical Impairments Affecting Rehab Potential  (+) acute condition, motivated  (-) chronic  condition     PT Frequency  2x / week    PT Duration  8 weeks    PT Treatment/Interventions  Iontophoresis 4mg /ml Dexamethasone;Patient/family education;Electrical Stimulation;Cryotherapy;Neuromuscular re-education;Manual techniques;Therapeutic exercise;Ultrasound;Moist Heat;Gait training;Passive range of motion;Therapeutic activities;Balance training    PT Next Visit Plan  6MWT,     PT Home Exercise Plan  bridge exercise, SLR, scoot stretch, standing quad stretch    Consulted and Agree with Plan of Care  Patient       Patient will benefit from skilled therapeutic intervention in order to improve the following deficits and impairments:  Abnormal gait, Increased fascial restricitons, Pain, Improper body mechanics, Postural dysfunction, Decreased endurance, Decreased range of motion, Decreased activity tolerance, Decreased strength, Difficulty walking, Impaired flexibility  Visit Diagnosis: Type I or II open fracture of left tibial plateau with routine healing, subsequent encounter     Problem List Patient Active Problem List   Diagnosis Date Noted  . Tibial plateau fracture 07/31/2017  . Chronic systolic heart failure ([place]) 10/08/2015  . Chronic obstructive pulmonary disease ([place]) 07/04/2015  . Controlled gout 06/06/2015  . Anxiety 02/09/2015  . Synovial cyst of popliteal space 02/09/2015  . Benign hypertension 02/09/2015  . Blood type A+ 02/09/2015  . Arterial branch occlusion of retina 02/09/2015  . Chronic constipation 02/09/2015  . Insomnia, persistent 02/09/2015  . Chronic combined systolic and diastolic CHF, NYHA class 1 ([place]) 02/09/2015  . Compromised kidney function 02/09/2015  . Dyslipidemia 02/09/2015  . Atrial fibrillation ([place]) 02/09/2015  . Arthritis urica 02/09/2015  . Hearing loss 02/09/2015  . History of open heart surgery 02/09/2015  . Chronic hoarseness 02/09/2015  . Cardiomegaly 02/09/2015  . Dysmetabolic syndrome 93/81/8299  . Migraine with aura and without status  migrainosus, not intractable 02/09/2015  . Adult BMI 30+ 02/09/2015  . Hypertensive pulmonary vascular disease ([place]) 02/09/2015  . Allergic rhinitis, seasonal 02/09/2015  . Vitamin D deficiency 02/09/2015  . History of mitral valve repair 10/01/2012  . History of aortic valve replacement 10/01/2012  . Congestive heart failure ([place]) 10/01/2012   Shelton Silvas PT, DPT Shelton Silvas 12/21/2017, 2:50 PM  [facility] [address], [place], [address] Phone: [phone]   Fax:  [phone]  Name: RODERICA CATHELL MRN: [id number] Date of Birth: 07-Jul-1941

## 2017-12-22 ENCOUNTER — Other Ambulatory Visit: Payer: Self-pay | Admitting: Family Medicine

## 2017-12-22 DIAGNOSIS — J440 Chronic obstructive pulmonary disease with acute lower respiratory infection: Secondary | ICD-10-CM

## 2017-12-23 ENCOUNTER — Encounter: Payer: Medicare Other | Admitting: Physical Therapy

## 2017-12-23 DIAGNOSIS — Y33XXXA Other specified events, undetermined intent, initial encounter: Secondary | ICD-10-CM | POA: Diagnosis not present

## 2017-12-23 DIAGNOSIS — S52572D Other intraarticular fracture of lower end of left radius, subsequent encounter for closed fracture with routine healing: Secondary | ICD-10-CM | POA: Diagnosis not present

## 2017-12-23 DIAGNOSIS — M8588 Other specified disorders of bone density and structure, other site: Secondary | ICD-10-CM | POA: Diagnosis not present

## 2017-12-24 ENCOUNTER — Ambulatory Visit: Payer: Medicare Other | Admitting: Physical Therapy

## 2017-12-24 ENCOUNTER — Encounter: Payer: Self-pay | Admitting: Physical Therapy

## 2017-12-24 DIAGNOSIS — M25632 Stiffness of left wrist, not elsewhere classified: Secondary | ICD-10-CM | POA: Diagnosis not present

## 2017-12-24 DIAGNOSIS — M25552 Pain in left hip: Secondary | ICD-10-CM | POA: Diagnosis not present

## 2017-12-24 DIAGNOSIS — R262 Difficulty in walking, not elsewhere classified: Secondary | ICD-10-CM | POA: Diagnosis not present

## 2017-12-24 DIAGNOSIS — M25642 Stiffness of left hand, not elsewhere classified: Secondary | ICD-10-CM | POA: Diagnosis not present

## 2017-12-24 DIAGNOSIS — S82142E Displaced bicondylar fracture of left tibia, subsequent encounter for open fracture type I or II with routine healing: Secondary | ICD-10-CM | POA: Diagnosis not present

## 2017-12-24 DIAGNOSIS — M6281 Muscle weakness (generalized): Secondary | ICD-10-CM | POA: Diagnosis not present

## 2017-12-24 NOTE — Therapy (Signed)
[facility] [address], [place], [address] Phone: [phone]   Fax:  [phone]  Physical Therapy Treatment  Patient Details  Name: [name] MRN: [id number] Date of Birth: 01-24-1941 Referring Provider: Inge Rise, MD   Encounter Date: 12/24/2017  PT End of Session - 12/24/17 1309    Visit Number  3    Number of Visits  17    Date for PT Re-Evaluation  02/10/18    PT Start Time  0100    PT Stop Time  0145    PT Time Calculation (min)  45 min    Activity Tolerance  Patient tolerated treatment well    Behavior During Therapy  [facility] for tasks assessed/performed       Past Medical History:  Diagnosis Date  . Atrial fibrillation ([place])   . Breast cancer ([place]) 1992   positive, radiation  . CHF (congestive heart failure) ([place])   . COPD (chronic obstructive pulmonary disease) ([place])   . History of aortic valve replacement   . Hyperlipidemia     Past Surgical History:  Procedure Laterality Date  . ABDOMINAL HYSTERECTOMY  1996  . BREAST BIOPSY Right 1998   neg  . BREAST BIOPSY Right 2007   neg  . BREAST BIOPSY Right 1992   positive  . BREAST EXCISIONAL BIOPSY Left 2000   neg  . CARDIAC VALVE REPLACEMENT  2014    There were no vitals filed for this visit.  Subjective Assessment - 12/24/17 1307    Subjective  Patient reports she is having no pain in the L LE today, only stiffness. Patient reports she is feeling "good", but would like to be able to walk further. Patient reports compliance with her HEP.     Pertinent History  Patient is a 77 y/o female s/p L ORIF tibial plateau fx following a fall down 5 steps Nov 2018, surgery Aug 27, 2017. Patient suffered another fall while in [place] preparing for tibial surgery, and fractured the distal L radius.  Patient had ORIF of L radius in Dec 26th. Patient reports she has had therapy in the SNF and at home over the past 4 months. Patient  reports she was ambulating with walker with LUE support, and then began ambulating with single crutch at the end of Jan 2019, and has been walking without AD for the past 2 weeks. Patient reports she finished home PT last Friday and reports she has been "exercising her legs and walking" with home health PT. Patient reports worst pain in the past week: 1/10 which only comes on when she is getting into bed best: 0/10    Limitations  Lifting;Standing;Walking;House hold activities    How long can you sit comfortably?  unlimited    How long can you stand comfortably?  60min    How long can you walk comfortably?  30 min.    Patient Stated Goals  Wants to be able to go back to work and walk longer without pain    Pain Onset  More than a month ago          Ther-Ex -Nustep 4 mins during hx intake (unbilled) -Stair hip flexor stretch 3x 45sec holds -Prone hip ext 1x 15, adding 2# ankle wt 2x 12 with min cuing to prevent hip rotation compensation -SLR w/ 2# ankle wt 2x 12 w/ min cuing for eccentric control -Sidelying hip abduction w/ 2# ankle wt 3x 12 with min  cuing to prevent "knees over toes" with full hip flex/ext -Standing DF 2x 12 at counter top with min cuing for proper form -6MWT 638ft with decreased stance time on L and decreased step length on R. Patient ambulates with decreased cadence and needs  -SLS demo for home exercise 15sec with bilat UE support                    PT Education - 12/24/17 1309    Education provided  Yes    Education Details  Exercise form    Person(s) Educated  Patient    Methods  Explanation;Demonstration;Verbal cues;Tactile cues    Comprehension  Verbal cues required;Returned demonstration;Verbalized understanding;Tactile cues required       PT Short Term Goals - 12/16/17 1551      PT SHORT TERM GOAL #1   Title  Pt will be independent with HEP in order to improve strength and balance in order to decrease fall risk and improve function at home  and work.    Time  2    Period  Weeks        PT Long Term Goals - 12/16/17 1552      PT LONG TERM GOAL #1   Title  Pt will increase LEFS by at least 9 points in order to demonstrate significant improvement in lower extremity function.[facility]    Baseline  4/10 42%    Time  8    Period  Weeks    Status  New      PT LONG TERM GOAL #2   Title  Patient will increase FOTO score to 55 to demonstrate predicted increase in functional mobility to complete ADLs    Baseline  4/10 42    Time  8    Period  Weeks    Status  New      PT LONG TERM GOAL #3   Title  Pt will decrease 5TSTS by at least 3 seconds in order to demonstrate clinically significant improvement in LE strength    Baseline  4/10 15sec    Time  8    Period  Weeks    Status  New      PT LONG TERM GOAL #4   Title   Pt will increase 6MWT by at least 24m (166ft) in order to demonstrate clinically significant improvement in cardiopulmonary endurance and community ambulation    Baseline  To be done next visit    Time  8    Period  Weeks    Status  New            Plan - 12/24/17 1321    Clinical Impression Statement  Patient tolerated therex progression well with no increased pain. During 6MWT patient demonstrated decreased endurance, requiring seated rest breaks during test. Patient reported no pain during walk test, only fatigue. Pt ambulates with decreased stance time on L  and decreased step lenth on R LE. PT increased exercises on pts HEP as she is very compliant and is progressing well with strengthening exercises. Pt demonstrated SLS and PT encouraged patient to attempt this at home with a counter top for support. PT will continue to assess single leg balance next appt.     Clinical Impairments Affecting Rehab Potential  (+) acute condition, motivated  (-) chronic condition    PT Frequency  2x / week    PT Duration  8 weeks    PT Treatment/Interventions  Iontophoresis 4mg /ml Dexamethasone;Patient/family  education;Electrical Stimulation;Cryotherapy;Neuromuscular re-education;Manual  techniques;Therapeutic exercise;Ultrasound;Moist Heat;Gait training;Passive range of motion;Therapeutic activities;Balance training    PT Next Visit Plan  SLS balance and L LE strengthening    PT Home Exercise Plan  bridge exercise, SLR, scoot stretch, standing quad stretch    Consulted and Agree with Plan of Care  Patient       Patient will benefit from skilled therapeutic intervention in order to improve the following deficits and impairments:  Abnormal gait, Increased fascial restricitons, Pain, Improper body mechanics, Postural dysfunction, Decreased endurance, Decreased range of motion, Decreased activity tolerance, Decreased strength, Difficulty walking, Impaired flexibility  Visit Diagnosis: Type I or II open fracture of left tibial plateau with routine healing, subsequent encounter     Problem List Patient Active Problem List   Diagnosis Date Noted  . Tibial plateau fracture 07/31/2017  . Chronic systolic heart failure ([place]) 10/08/2015  . Chronic obstructive pulmonary disease ([place]) 07/04/2015  . Controlled gout 06/06/2015  . Anxiety 02/09/2015  . Synovial cyst of popliteal space 02/09/2015  . Benign hypertension 02/09/2015  . Blood type A+ 02/09/2015  . Arterial branch occlusion of retina 02/09/2015  . Chronic constipation 02/09/2015  . Insomnia, persistent 02/09/2015  . Chronic combined systolic and diastolic CHF, NYHA class 1 ([place]) 02/09/2015  . Compromised kidney function 02/09/2015  . Dyslipidemia 02/09/2015  . Atrial fibrillation ([place]) 02/09/2015  . Arthritis urica 02/09/2015  . Hearing loss 02/09/2015  . History of open heart surgery 02/09/2015  . Chronic hoarseness 02/09/2015  . Cardiomegaly 02/09/2015  . Dysmetabolic syndrome 76/19/5093  . Migraine with aura and without status migrainosus, not intractable 02/09/2015  . Adult BMI 30+ 02/09/2015  . Hypertensive pulmonary vascular  disease ([place]) 02/09/2015  . Allergic rhinitis, seasonal 02/09/2015  . Vitamin D deficiency 02/09/2015  . History of mitral valve repair 10/01/2012  . History of aortic valve replacement 10/01/2012  . Congestive heart failure ([place]) 10/01/2012   Shelton Silvas PT, DPT Shelton Silvas 12/24/2017, 3:59 PM  [facility] [address] [place], [address] Phone: [phone]   Fax:  [phone]  Name: [name] MRN: [id number] Date of Birth: 1941/04/01

## 2017-12-28 ENCOUNTER — Other Ambulatory Visit: Payer: Self-pay | Admitting: Family Medicine

## 2017-12-28 ENCOUNTER — Ambulatory Visit: Payer: Medicare Other | Admitting: Physical Therapy

## 2017-12-29 ENCOUNTER — Other Ambulatory Visit: Payer: Self-pay

## 2017-12-29 ENCOUNTER — Encounter: Payer: Self-pay | Admitting: Occupational Therapy

## 2017-12-29 ENCOUNTER — Ambulatory Visit: Payer: Medicare Other | Admitting: Occupational Therapy

## 2017-12-29 DIAGNOSIS — M25552 Pain in left hip: Secondary | ICD-10-CM | POA: Diagnosis not present

## 2017-12-29 DIAGNOSIS — S82142E Displaced bicondylar fracture of left tibia, subsequent encounter for open fracture type I or II with routine healing: Secondary | ICD-10-CM | POA: Diagnosis not present

## 2017-12-29 DIAGNOSIS — M25632 Stiffness of left wrist, not elsewhere classified: Secondary | ICD-10-CM | POA: Diagnosis not present

## 2017-12-29 DIAGNOSIS — R262 Difficulty in walking, not elsewhere classified: Secondary | ICD-10-CM | POA: Diagnosis not present

## 2017-12-29 DIAGNOSIS — M6281 Muscle weakness (generalized): Secondary | ICD-10-CM | POA: Diagnosis not present

## 2017-12-29 DIAGNOSIS — M25532 Pain in left wrist: Secondary | ICD-10-CM

## 2017-12-29 DIAGNOSIS — M25642 Stiffness of left hand, not elsewhere classified: Secondary | ICD-10-CM | POA: Diagnosis not present

## 2017-12-29 NOTE — Patient Instructions (Signed)
Heat  PROM for digits extention  Rolling over cylinder object for extention of digits   Tendon glides  10 reps  Each  AAROM for wrist flexion , ext, RD, UD  10 reps  AROM for wrist in all planes  3 x day

## 2017-12-29 NOTE — Therapy (Signed)
[facility] [address] [place], [address] Phone: [phone]   Fax:  [phone]  Occupational Therapy Evaluation  Patient Details  Name: [name] MRN: [id number] Date of Birth: 08/31/1941 Referring Provider: Regis Bill   Encounter Date: 12/29/2017  OT End of Session - 12/29/17 1854    Visit Number  1    Number of Visits  12    Date for OT Re-Evaluation  02/09/18    OT Start Time  1438    OT Stop Time  1533    OT Time Calculation (min)  55 min    Activity Tolerance  Patient tolerated treatment well    Behavior During Therapy  [facility] for tasks assessed/performed       Past Medical History:  Diagnosis Date  . Atrial fibrillation ([place])   . Breast cancer ([place]) 1992   positive, radiation  . CHF (congestive heart failure) ([place])   . COPD (chronic obstructive pulmonary disease) ([place])   . History of aortic valve replacement   . Hyperlipidemia     Past Surgical History:  Procedure Laterality Date  . ABDOMINAL HYSTERECTOMY  1996  . BREAST BIOPSY Right 1998   neg  . BREAST BIOPSY Right 2007   neg  . BREAST BIOPSY Right 1992   positive  . BREAST EXCISIONAL BIOPSY Left 2000   neg  . CARDIAC VALVE REPLACEMENT  2014    There were no vitals filed for this visit.  Subjective Assessment - 12/29/17 1718    Subjective   I fell in Nov of my porch broke my leg and then I fell in Dec in the rehab place and broke my arm - had surgery 12/26 and then they removed the plate 4/3 and stitches 4/17  - since then always had cast or splint on my arm - wrist is really stiff     Patient Stated Goals  I need to be able to use my and to pick up , grip to cook , cater and play piano - i need to go back to work , I work in [employer]     Currently in Pain?  No/denies        [facility] OT Assessment - 12/29/17 0001      Assessment   Medical Diagnosis  L ORIF distal radius fx with bridge plate removed     Referring  Provider  Tasia Catchings, I[place]ling    Onset Date/Surgical Date  09/02/17    Hand Dominance  Right    Prior Therapy  STR and HH      Precautions   Required Braces or Orthoses  -- wrist prefab brace       Balance Screen   Has the patient fallen in the past 6 months  Yes    How many times?  2    Has the patient had a decrease in activity level because of a fear of falling?   Yes    Is the patient reluctant to leave their home because of a fear of falling?   No      Home  Environment   Lives With  Alone      Prior Function   Level of Independence  Independent    Leisure  works at [employer], likes to [employer] and cater, play piano       AROM   Right Wrist Extension  60 Degrees    Right Wrist Flexion  80 Degrees  Right Wrist Radial Deviation  26 Degrees    Right Wrist Ulnar Deviation  33 Degrees    Left Wrist Extension  23 Degrees    Left Wrist Flexion  38 Degrees    Left Wrist Radial Deviation  12 Degrees    Left Wrist Ulnar Deviation  15 Degrees      Left Hand AROM   L Index  MCP 0-90  65 Degrees    L Index PIP 0-100  80 Degrees    L Long  MCP 0-90  65 Degrees    L Long PIP 0-100  90 Degrees    L Ring  MCP 0-90  75 Degrees    L Ring PIP 0-100  92 Degrees    L Little  MCP 0-90  75 Degrees    L Little PIP 0-100  90 Degrees       Fluidotheapy done with AROM for L wrist and digits in all planes prior to review of HEP    Review HEP with pt - hand out provided    Heat  PROM for digits extention  Rolling over cylinder object for extention of digits   Tendon glides  10 reps  Each  AAROM for wrist flexion , ext, RD, UD  10 reps  AROM for wrist in all planes  3 x day              OT Education - 12/29/17 1854    Education provided  Yes    Education Details  findings of eval and HEP     Person(s) Educated  Patient    Methods  Explanation;Demonstration;Tactile cues;Verbal cues;Handout    Comprehension  Returned demonstration;Verbalized understanding       OT Short  Term Goals - 12/29/17 1901      OT SHORT TERM GOAL #1   Title  Pt to be independent in HEP to increase AROM in digits , wrist to use L hand in more than 50% of bathing , dressing , and cooking     Baseline  82% affected not using L hand     Time  3    Period  Weeks    Status  New    Target Date  01/19/18      OT SHORT TERM GOAL #2   Title  L digits AROM improve for pt to make tight fist and open digits to reach into pocket / play keyboard     Baseline  see flowsheet     Time  3    Period  Weeks    Status  New    Target Date  01/19/18      OT SHORT TERM GOAL #3   Title  L wrist AROM improve with more than 10-15 degrees to turn doorknob, bath, fasten bra, stir and cut in cooking     Baseline  see flowsheet     Time  4    Period  Weeks    Status  New    Target Date  01/26/18        OT Long Term Goals - 12/29/17 1904      OT LONG TERM GOAL #1   Title  Grip strength and prehension improve to more than 50% compare to R hand to return to work    Baseline  NT     Time  6    Period  Weeks    Status  New    Target Date  02/09/18  OT LONG TERM GOAL #2   Title  Function score on PRWHE improve with more than 20 points     Baseline  at eval function score on PRWHE 41/50     Time  6    Period  Weeks    Status  New    Target Date  02/09/18      OT LONG TERM GOAL #3   Title  Strength in L wrist improve to 4+/5 to carry  and lift more than 5 lbs to perform her job , push door open     Baseline  strength decrease - with fisting unable to maintain wrist in neutral - plate removed 3 wks ago     Time  6    Period  Weeks    Status  New    Target Date  02/09/18            Plan - 12/29/17 1855    Clinical Impression Statement  Pt present at OT eval with diagnosis of L wrist ORIF on 09/02/17  - post distal radius fx , pt had bridge plate that was removed 4/3 /19 - pt refer for ROM and strengthening - per pt she has 5 lbs limiit and can go back to work on 4/29 - but do not  know if she would be able to do that - pt show decrease digits and wrist AROM and PROM - decrease strength - all limiting her functional use - pt can benefit from OT services      Occupational performance deficits (Please refer to evaluation for details):  ADL's;IADL's;Work;Play;Leisure    Rehab Potential  Good    Current Impairments/barriers affecting progress:  Pt was in cast or splint since 09/02/17 -     OT Frequency  2x / week    OT Duration  6 weeks    OT Treatment/Interventions  Self-care/ADL training;Patient/family education;Paraffin;Fluidtherapy;Scar mobilization;Manual Therapy;Therapeutic exercise;Passive range of motion    Plan  assess progress with HEP     Clinical Decision Making  Multiple treatment options, significant modification of task necessary    OT Home Exercise Plan  see pt instruction    Consulted and Agree with Plan of Care  Patient       Patient will benefit from skilled therapeutic intervention in order to improve the following deficits and impairments:  Impaired flexibility, Decreased scar mobility, Decreased strength, Decreased range of motion, Impaired UE functional use  Visit Diagnosis: Stiffness of left wrist, not elsewhere classified  Stiffness of left hand, not elsewhere classified  Muscle weakness (generalized)  Pain in left wrist    Problem List Patient Active Problem List   Diagnosis Date Noted  . Tibial plateau fracture 07/31/2017  . Chronic systolic heart failure ([place]) 10/08/2015  . Chronic obstructive pulmonary disease ([place]) 07/04/2015  . Controlled gout 06/06/2015  . Anxiety 02/09/2015  . Synovial cyst of popliteal space 02/09/2015  . Benign hypertension 02/09/2015  . Blood type A+ 02/09/2015  . Arterial branch occlusion of retina 02/09/2015  . Chronic constipation 02/09/2015  . Insomnia, persistent 02/09/2015  . Chronic combined systolic and diastolic CHF, NYHA class 1 ([place]) 02/09/2015  . Compromised kidney function 02/09/2015  .  Dyslipidemia 02/09/2015  . Atrial fibrillation ([place]) 02/09/2015  . Arthritis urica 02/09/2015  . Hearing loss 02/09/2015  . History of open heart surgery 02/09/2015  . Chronic hoarseness 02/09/2015  . Cardiomegaly 02/09/2015  . Dysmetabolic syndrome 62/83/1517  . Migraine with aura and without status migrainosus, not intractable  02/09/2015  . Adult BMI 30+ 02/09/2015  . Hypertensive pulmonary vascular disease ([place]) 02/09/2015  . Allergic rhinitis, seasonal 02/09/2015  . Vitamin D deficiency 02/09/2015  . History of mitral valve repair 10/01/2012  . History of aortic valve replacement 10/01/2012  . Congestive heart failure ([place]) 10/01/2012    Rosalyn Gess OTR/L,CLT 12/29/2017, 7:13 PM  [facility] [address], [place], [address] Phone: [phone]   Fax:  [phone]  Name: AKANE TESSIER MRN: [id number] Date of Birth: 1941-01-13

## 2017-12-30 ENCOUNTER — Encounter: Payer: Self-pay | Admitting: Physical Therapy

## 2017-12-30 ENCOUNTER — Ambulatory Visit: Payer: Medicare Other | Admitting: Physical Therapy

## 2017-12-30 DIAGNOSIS — M6281 Muscle weakness (generalized): Secondary | ICD-10-CM | POA: Diagnosis not present

## 2017-12-30 DIAGNOSIS — S82142E Displaced bicondylar fracture of left tibia, subsequent encounter for open fracture type I or II with routine healing: Secondary | ICD-10-CM

## 2017-12-30 DIAGNOSIS — M25632 Stiffness of left wrist, not elsewhere classified: Secondary | ICD-10-CM

## 2017-12-30 DIAGNOSIS — M25552 Pain in left hip: Secondary | ICD-10-CM | POA: Diagnosis not present

## 2017-12-30 DIAGNOSIS — M25642 Stiffness of left hand, not elsewhere classified: Secondary | ICD-10-CM | POA: Diagnosis not present

## 2017-12-30 DIAGNOSIS — R262 Difficulty in walking, not elsewhere classified: Secondary | ICD-10-CM | POA: Diagnosis not present

## 2017-12-30 NOTE — Therapy (Signed)
[facility] [address] [place], [address] Phone: [phone]   Fax:  [phone]  Physical Therapy Treatment  Patient Details  Name: [name] MRN: [id number] Date of Birth: Jan 29, 1941 Referring Provider: Regis Bill   Encounter Date: 12/30/2017  PT End of Session - 12/30/17 1330    Visit Number  4    Number of Visits  17    Date for PT Re-Evaluation  02/10/18    PT Start Time  0100    PT Stop Time  0145    PT Time Calculation (min)  45 min    Activity Tolerance  Patient tolerated treatment well    Behavior During Therapy  [facility] for tasks assessed/performed       Past Medical History:  Diagnosis Date  . Atrial fibrillation ([place])   . Breast cancer ([place]) 1992   positive, radiation  . CHF (congestive heart failure) ([place])   . COPD (chronic obstructive pulmonary disease) ([place])   . History of aortic valve replacement   . Hyperlipidemia     Past Surgical History:  Procedure Laterality Date  . ABDOMINAL HYSTERECTOMY  1996  . BREAST BIOPSY Right 1998   neg  . BREAST BIOPSY Right 2007   neg  . BREAST BIOPSY Right 1992   positive  . BREAST EXCISIONAL BIOPSY Left 2000   neg  . CARDIAC VALVE REPLACEMENT  2014    There were no vitals filed for this visit.  Subjective Assessment - 12/30/17 1305    Subjective  Patient reports no pain in the L LE. Patient reports she is still having some stiffness worse in the am. Patient reports she is supposed to go back to Monday, and really hjopes she will be able. Patient reports she did a lot of walking over the weekend and reports she did "okay" with it.     Pertinent History  Patient is a 76 y/o female s/p L ORIF tibial plateau fx following a fall down 5 steps Nov 2018, surgery Aug 27, 2017. Patient suffered another fall while in [place] preparing for tibial surgery, and fractured the distal L radius.  Patient had ORIF of L radius in Dec 26th. Patient  reports she has had therapy in the SNF and at home over the past 4 months. Patient reports she was ambulating with walker with LUE support, and then began ambulating with single crutch at the end of Jan 2019, and has been walking without AD for the past 2 weeks. Patient reports she finished home PT last Friday and reports she has been "exercising her legs and walking" with home health PT. Patient reports worst pain in the past week: 1/10 which only comes on when she is getting into bed best: 0/10    Limitations  Lifting;Standing;Walking;House hold activities    How long can you sit comfortably?  unlimited    How long can you stand comfortably?  67min    How long can you walk comfortably?  30 min.    Patient Stated Goals  Wants to be able to go back to work and walk longer without pain    Pain Onset  More than a month ago          Ther-Ex -Nustep 4 mins for warm up -Stair hip flexor stretch 3x 30sec holds each side (as patient reports some tightness on R side as well) -Stair hamstring stretch 3x 30sec holds each side w/ demonstration initially and VC  for proper form -Standing hip abd 3x 10e w/ cuing for proper pelvic alignment and eccentric control -Standing hip ext 3x 10e w/ cuing for prior glute contraction to activate hip ext, and eccentric control with R LE movement as patient does not have the strength to maintain SLS on L LE  -Standing toe raises 2x 12 (HEP review) with min cuing to prevent "rocking" for correct ant tib activation  -SLS L LE 14sec/ 20sec / 18sec compared to R LE 30sec hold -Monster walks with yellow tband 3x 59ft with PT cuing for eccentric control with adduction moment                  PT Education - 12/30/17 1324    Education Details  HEP addition review; exercise form    Person(s) Educated  Patient    Methods  Explanation;Verbal cues;Demonstration    Comprehension  Verbalized understanding;Returned demonstration;Verbal cues required       PT Short  Term Goals - 12/16/17 1551      PT SHORT TERM GOAL #1   Title  Pt will be independent with HEP in order to improve strength and balance in order to decrease fall risk and improve function at home and work.    Time  2    Period  Weeks        PT Long Term Goals - 12/16/17 1552      PT LONG TERM GOAL #1   Title  Pt will increase LEFS by at least 9 points in order to demonstrate significant improvement in lower extremity function.[facility]    Baseline  4/10 42%    Time  8    Period  Weeks    Status  New      PT LONG TERM GOAL #2   Title  Patient will increase FOTO score to 55 to demonstrate predicted increase in functional mobility to complete ADLs    Baseline  4/10 42    Time  8    Period  Weeks    Status  New      PT LONG TERM GOAL #3   Title  Pt will decrease 5TSTS by at least 3 seconds in order to demonstrate clinically significant improvement in LE strength    Baseline  4/10 15sec    Time  8    Period  Weeks    Status  New      PT LONG TERM GOAL #4   Title   Pt will increase 6MWT by at least 85m (112ft) in order to demonstrate clinically significant improvement in cardiopulmonary endurance and community ambulation    Baseline  To be done next visit    Time  8    Period  Weeks    Status  New            Plan - 12/30/17 1341    Clinical Impression Statement  Patient tolerated therex progression well with no increased pain, requiring cuing for form and demonstration initially, but she was able to correct form with cuing. Patient has difficulty with eccentric control with R LE abd and ext d/t weakness on L LE with SLS. During balance SLS patient demonstrates good balance, leading PT to believe this lack of eccentric control with R LE abd/ext is d/t strength deficits. PT will continue to increase SLS demand and L LE strengthening    Rehab Potential  Good    Clinical Impairments Affecting Rehab Potential  (+) acute condition, motivated  (-) chronic condition  PT Frequency   2x / week    PT Treatment/Interventions  Iontophoresis 4mg /ml Dexamethasone;Patient/family education;Electrical Stimulation;Cryotherapy;Neuromuscular re-education;Manual techniques;Therapeutic exercise;Ultrasound;Moist Heat;Gait training;Passive range of motion;Therapeutic activities;Balance training    PT Next Visit Plan  Continue L LE strengthening as able; gait training    PT Home Exercise Plan  bridge exercise, SLR, scoot stretch, standing quad stretch    Consulted and Agree with Plan of Care  Patient       Patient will benefit from skilled therapeutic intervention in order to improve the following deficits and impairments:  Abnormal gait, Increased fascial restricitons, Pain, Improper body mechanics, Postural dysfunction, Decreased endurance, Decreased range of motion, Decreased activity tolerance, Decreased strength, Difficulty walking, Impaired flexibility  Visit Diagnosis: Type I or II open fracture of left tibial plateau with routine healing, subsequent encounter  Stiffness of left wrist, not elsewhere classified     Problem List Patient Active Problem List   Diagnosis Date Noted  . Tibial plateau fracture 07/31/2017  . Chronic systolic heart failure ([place]) 10/08/2015  . Chronic obstructive pulmonary disease ([place]) 07/04/2015  . Controlled gout 06/06/2015  . Anxiety 02/09/2015  . Synovial cyst of popliteal space 02/09/2015  . Benign hypertension 02/09/2015  . Blood type A+ 02/09/2015  . Arterial branch occlusion of retina 02/09/2015  . Chronic constipation 02/09/2015  . Insomnia, persistent 02/09/2015  . Chronic combined systolic and diastolic CHF, NYHA class 1 ([place]) 02/09/2015  . Compromised kidney function 02/09/2015  . Dyslipidemia 02/09/2015  . Atrial fibrillation ([place]) 02/09/2015  . Arthritis urica 02/09/2015  . Hearing loss 02/09/2015  . History of open heart surgery 02/09/2015  . Chronic hoarseness 02/09/2015  . Cardiomegaly 02/09/2015  . Dysmetabolic syndrome  24/05/7352  . Migraine with aura and without status migrainosus, not intractable 02/09/2015  . Adult BMI 30+ 02/09/2015  . Hypertensive pulmonary vascular disease ([place]) 02/09/2015  . Allergic rhinitis, seasonal 02/09/2015  . Vitamin D deficiency 02/09/2015  . History of mitral valve repair 10/01/2012  . History of aortic valve replacement 10/01/2012  . Congestive heart failure ([place]) 10/01/2012   Shelton Silvas PT, DPT Shelton Silvas 12/30/2017, 2:47 PM  [facility] [address] [place], [address] Phone: [phone]   Fax:  [phone]  Name: [name] MRN: [id number] Date of Birth: 1941/08/15

## 2018-01-04 ENCOUNTER — Encounter: Payer: Medicare Other | Admitting: Physical Therapy

## 2018-01-05 ENCOUNTER — Ambulatory Visit: Payer: Medicare Other | Admitting: Occupational Therapy

## 2018-01-05 ENCOUNTER — Encounter: Payer: Self-pay | Admitting: Physical Therapy

## 2018-01-05 ENCOUNTER — Ambulatory Visit: Payer: Medicare Other | Admitting: Physical Therapy

## 2018-01-05 DIAGNOSIS — S82142E Displaced bicondylar fracture of left tibia, subsequent encounter for open fracture type I or II with routine healing: Secondary | ICD-10-CM | POA: Diagnosis not present

## 2018-01-05 DIAGNOSIS — M25642 Stiffness of left hand, not elsewhere classified: Secondary | ICD-10-CM | POA: Diagnosis not present

## 2018-01-05 DIAGNOSIS — M25532 Pain in left wrist: Secondary | ICD-10-CM

## 2018-01-05 DIAGNOSIS — M25552 Pain in left hip: Secondary | ICD-10-CM | POA: Diagnosis not present

## 2018-01-05 DIAGNOSIS — M25632 Stiffness of left wrist, not elsewhere classified: Secondary | ICD-10-CM

## 2018-01-05 DIAGNOSIS — M6281 Muscle weakness (generalized): Secondary | ICD-10-CM

## 2018-01-05 DIAGNOSIS — R262 Difficulty in walking, not elsewhere classified: Secondary | ICD-10-CM

## 2018-01-05 NOTE — Patient Instructions (Signed)
Cont with same HEP   Add AAROM for wrist flexion , ext, RD, UD  10 reps   2 x day  And then AROM  PROM for prayer stretch and wrist flexion - inbetween 10 reps  PUtty teal for gripping , lat and 3 point grip  And rolling   2 x 10 reps   but not over do - review precautions  And scar massage over dorsal 3 scars and towel rubs

## 2018-01-05 NOTE — Therapy (Signed)
[facility] [address] [place], [address] Phone: [phone]   Fax:  [phone]  Occupational Therapy Treatment  Patient Details  Name: [name] MRN: [id number] Date of Birth: [birth year] Referring Provider: Regis Bill   Encounter Date: 01/05/2018  OT End of Session - 01/05/18 1417    Visit Number  2    Number of Visits  12    Date for OT Re-Evaluation  02/09/18    OT Start Time  1115    OT Stop Time  1201    OT Time Calculation (min)  46 min    Activity Tolerance  Patient tolerated treatment well    Behavior During Therapy  [facility] for tasks assessed/performed       Past Medical History:  Diagnosis Date  . Atrial fibrillation ([place])   . Breast cancer ([place]) 1992   positive, radiation  . CHF (congestive heart failure) ([place])   . COPD (chronic obstructive pulmonary disease) ([place])   . History of aortic valve replacement   . Hyperlipidemia     Past Surgical History:  Procedure Laterality Date  . ABDOMINAL HYSTERECTOMY  1996  . BREAST BIOPSY Right 1998   neg  . BREAST BIOPSY Right 2007   neg  . BREAST BIOPSY Right 1992   positive  . BREAST EXCISIONAL BIOPSY Left 2000   neg  . CARDIAC VALVE REPLACEMENT  2014    There were no vitals filed for this visit.  Subjective Assessment - 01/05/18 1411    Subjective   I did the exercises and see I think my wrist is moving better - I will get somebody this weekend to cut my nails and then I am going back to school - but going to do summer school - and she says at work it will be max Weight in cafeteria 8 lbs     Patient Stated Goals  I need to be able to use my and to pick up , grip to cook , cater and play piano - i need to go back to work , I work in [employer]     Currently in Pain?  No/denies         [facility] OT Assessment - 01/05/18 0001      AROM   Left Wrist Extension  44 Degrees    Left Wrist Flexion  45 Degrees    Left Wrist Radial  Deviation  18 Degrees    Left Wrist Ulnar Deviation  20 Degrees      Left Hand AROM   L Index  MCP 0-90  75 Degrees    L Index PIP 0-100  80 Degrees    L Long  MCP 0-90  75 Degrees    L Long PIP 0-100  90 Degrees    L Ring  MCP 0-90  82 Degrees    L Ring PIP 0-100  90 Degrees    L Little  MCP 0-90  85 Degrees    L Little PIP 0-100  90 Degrees      AROM for wrist and digits taken - see flowsheet  Good progress           OT Treatments/Exercises (OP) - 01/05/18 0001      LUE Fluidotherapy   Number Minutes Fluidotherapy  10 Minutes    LUE Fluidotherapy Location  Hand;Wrist    Comments  AROM in fluido -prior to soft tissue and ROM  Soft tissue mobs done to volar and dorsal forearm and digits - palm  - using Graston tool nr 2 - sweeping  CP spreads and  And MC spreads  Followed by Putty teal for gripping , lat and 3 point grip  Also PROM to digits extention followed by rolling of putty   10 reps   2 x day - not to over do it  Wrist AAROM for wrist flexion , ext, RD, UD - 10 reps   prior to AROM for wrist in all planes   10 reps        OT Education - 01/05/18 1417    Education provided  Yes    Education Details  HEP update - putty and AAROM for wrist - painfree range     Person(s) Educated  Patient    Methods  Explanation;Demonstration    Comprehension  Returned demonstration       OT Short Term Goals - 12/29/17 1901      OT SHORT TERM GOAL #1   Title  Pt to be independent in HEP to increase AROM in digits , wrist to use L hand in more than 50% of bathing , dressing , and cooking     Baseline  82% affected not using L hand     Time  3    Period  Weeks    Status  New    Target Date  01/19/18      OT SHORT TERM GOAL #2   Title  L digits AROM improve for pt to make tight fist and open digits to reach into pocket / play keyboard     Baseline  see flowsheet     Time  3    Period  Weeks    Status  New    Target Date  01/19/18      OT SHORT TERM GOAL  #3   Title  L wrist AROM improve with more than 10-15 degrees to turn doorknob, bath, fasten bra, stir and cut in cooking     Baseline  see flowsheet     Time  4    Period  Weeks    Status  New    Target Date  01/26/18        OT Long Term Goals - 12/29/17 1904      OT LONG TERM GOAL #1   Title  Grip strength and prehension improve to more than 50% compare to R hand to return to work    Baseline  NT     Time  6    Period  Weeks    Status  New    Target Date  02/09/18      OT LONG TERM GOAL #2   Title  Function score on PRWHE improve with more than 20 points     Baseline  at eval function score on PRWHE 41/50     Time  6    Period  Weeks    Status  New    Target Date  02/09/18      OT LONG TERM GOAL #3   Title  Strength in L wrist improve to 4+/5 to carry  and lift more than 5 lbs to perform her job , push door open     Baseline  strength decrease - with fisting unable to maintain wrist in neutral - plate removed 3 wks ago     Time  6    Period  Weeks  Status  New    Target Date  02/09/18            Plan - 01/05/18 1458    Clinical Impression Statement  Pt made good progress since eval last eek in AROM in wrist in all planes- digits flexion still decrease and wrist- add this date AAROM for wrist and will initiate 1 lbs weight next session if can tolerate - did add putty for grip and prehension  strength     Occupational performance deficits (Please refer to evaluation for details):  ADL's;IADL's;Work;Play;Leisure    Rehab Potential  Good    Current Impairments/barriers affecting progress:  Pt was in cast or splint since 09/02/17 -     OT Frequency  2x / week    OT Duration  6 weeks    OT Treatment/Interventions  Self-care/ADL training;Patient/family education;Paraffin;Fluidtherapy;Scar mobilization;Manual Therapy;Therapeutic exercise;Passive range of motion    Plan  assess progress with HEP     Clinical Decision Making  Multiple treatment options, significant  modification of task necessary    OT Home Exercise Plan  see pt instruction    Consulted and Agree with Plan of Care  Patient       Patient will benefit from skilled therapeutic intervention in order to improve the following deficits and impairments:  Impaired flexibility, Decreased scar mobility, Decreased strength, Decreased range of motion, Impaired UE functional use  Visit Diagnosis: Stiffness of left wrist, not elsewhere classified  Stiffness of left hand, not elsewhere classified  Muscle weakness (generalized)  Pain in left wrist    Problem List Patient Active Problem List   Diagnosis Date Noted  . Tibial plateau fracture 07/31/2017  . Chronic systolic heart failure ([place]) 10/08/2015  . Chronic obstructive pulmonary disease ([place]) 07/04/2015  . Controlled gout 06/06/2015  . Anxiety 02/09/2015  . Synovial cyst of popliteal space 02/09/2015  . Benign hypertension 02/09/2015  . Blood type A+ 02/09/2015  . Arterial branch occlusion of retina 02/09/2015  . Chronic constipation 02/09/2015  . Insomnia, persistent 02/09/2015  . Chronic combined systolic and diastolic CHF, NYHA class 1 ([place]) 02/09/2015  . Compromised kidney function 02/09/2015  . Dyslipidemia 02/09/2015  . Atrial fibrillation ([place]) 02/09/2015  . Arthritis urica 02/09/2015  . Hearing loss 02/09/2015  . History of open heart surgery 02/09/2015  . Chronic hoarseness 02/09/2015  . Cardiomegaly 02/09/2015  . Dysmetabolic syndrome 69/67/8938  . Migraine with aura and without status migrainosus, not intractable 02/09/2015  . Adult BMI 30+ 02/09/2015  . Hypertensive pulmonary vascular disease ([place]) 02/09/2015  . Allergic rhinitis, seasonal 02/09/2015  . Vitamin D deficiency 02/09/2015  . History of mitral valve repair 10/01/2012  . History of aortic valve replacement 10/01/2012  . Congestive heart failure ([place]) 10/01/2012    Rosalyn Gess OTR/L,CLT 01/05/2018, 3:01 PM  [facility] [address] [place], [address] Phone: [phone]   Fax:  [phone]  Name: [name] MRN: [id number] Date of Birth: [birth year]

## 2018-01-05 NOTE — Therapy (Signed)
[facility] [address], [place], [address] Phone: [phone]   Fax:  [phone]  Physical Therapy Treatment  Patient Details  Name: [name] MRN: [id number] Date of Birth: 1941-05-06 Referring Provider: Regis Bill   Encounter Date: 01/05/2018  PT End of Session - 01/05/18 1042    Visit Number  5    Number of Visits  17    Date for PT Re-Evaluation  02/10/18    PT Start Time  1038    PT Stop Time  1113    PT Time Calculation (min)  35 min    Activity Tolerance  Patient tolerated treatment well    Behavior During Therapy  [facility] for tasks assessed/performed       Past Medical History:  Diagnosis Date  . Atrial fibrillation ([place])   . Breast cancer ([place]) 1992   positive, radiation  . CHF (congestive heart failure) ([place])   . COPD (chronic obstructive pulmonary disease) ([place])   . History of aortic valve replacement   . Hyperlipidemia     Past Surgical History:  Procedure Laterality Date  . ABDOMINAL HYSTERECTOMY  1996  . BREAST BIOPSY Right 1998   neg  . BREAST BIOPSY Right 2007   neg  . BREAST BIOPSY Right 1992   positive  . BREAST EXCISIONAL BIOPSY Left 2000   neg  . CARDIAC VALVE REPLACEMENT  2014    There were no vitals filed for this visit.  Subjective Assessment - 01/05/18 1042    Subjective  Pt denies any pain.  She says she is getting better and is not "hopping" as much.  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     Pertinent History  Patient is a 77 y/o female s/p L ORIF tibial plateau fx following a fall down 5 steps Nov 2018, surgery Aug 27, 2017. Patient suffered another fall while in [place] preparing for tibial surgery, and fractured the distal L radius.  Patient had ORIF of L radius in Dec 26th. Patient reports she has had therapy in the SNF and at home over the past 4 months. Patient reports  she was ambulating with walker with LUE support, and then began ambulating with single crutch at the end of Jan 2019, and has been walking without AD for the past 2 weeks. Patient reports she finished home PT last Friday and reports she has been "exercising her legs and walking" with home health PT. Patient reports worst pain in the past week: 1/10 which only comes on when she is getting into bed best: 0/10    Limitations  Lifting;Standing;Walking;House hold activities    How long can you sit comfortably?  unlimited    How long can you stand comfortably?  38min    How long can you walk comfortably?  30 min.    Patient Stated Goals  Wants to be able to go back to work and walk longer without pain    Currently in Pain?  No/denies       TREATMENT   Stair hip flexor stretch 3x 30sec holds each side (as patient reports some tightness on R side as well). Cues for proper hip alignment as pt initially externally rotated.   Stair hamstring stretch 3x 30sec holds each side w/ demonstration initially and VC for proper form   Standing hip abd 3x10 w/ cuing for proper  pelvic alignment and eccentric control   Standing hip ext 3x10x w/ cuing for prior glute contraction to activate hip ext, and eccentric control with R LE movement as patient does not have the strength to maintain SLS on L LE   SLS L LE x8 seconds, 25 seconds, 21 seconds   Mini lunge 2x10 each LE with demonstration and cues for proper technique. Pt initially with L toe out when in back. Intermittent light UE support for balance.   Forward and backward diagonal walks with yellow tband 4x32ft with cues for slight knee bend for greater glute recruitment   Alternating L and R SLS with ball toss with intermittent UE support due to imbalance                          PT Education - 01/05/18 1041    Education provided  Yes    Education Details  Exercise technique    Person(s) Educated  Patient    Methods   Explanation;Verbal cues;Demonstration    Comprehension  Verbalized understanding;Returned demonstration;Verbal cues required;Need further instruction       PT Short Term Goals - 12/16/17 1551      PT SHORT TERM GOAL #1   Title  Pt will be independent with HEP in order to improve strength and balance in order to decrease fall risk and improve function at home and work.    Time  2    Period  Weeks        PT Long Term Goals - 12/16/17 1552      PT LONG TERM GOAL #1   Title  Pt will increase LEFS by at least 9 points in order to demonstrate significant improvement in lower extremity function.[facility]    Baseline  4/10 42%    Time  8    Period  Weeks    Status  New      PT LONG TERM GOAL #2   Title  Patient will increase FOTO score to 55 to demonstrate predicted increase in functional mobility to complete ADLs    Baseline  4/10 42    Time  8    Period  Weeks    Status  New      PT LONG TERM GOAL #3   Title  Pt will decrease 5TSTS by at least 3 seconds in order to demonstrate clinically significant improvement in LE strength    Baseline  4/10 15sec    Time  8    Period  Weeks    Status  New      PT LONG TERM GOAL #4   Title   Pt will increase 6MWT by at least 54m (144ft) in order to demonstrate clinically significant improvement in cardiopulmonary endurance and community ambulation    Baseline  To be done next visit    Time  8    Period  Weeks    Status  New            Plan - 01/05/18 1042    Clinical Impression Statement  Pt arrived laste, limiting session.  Pt continues to report decreased tightness with Bil hip flexor stretch. Pt demonstrates instability and fatigue with SLS activities, especially on the LLE.  Introduced mini lunges this session for improved strengthening of Bil glutes and hamstrings.  Pt required demonstration and cues for proper technique with this. Pt demonstrates unsteadiness with more dynamic SLS activity.  Pt will benefit from continued skilled PT  interventions for improved functional use of LLE.      Rehab Potential  Good    Clinical Impairments Affecting Rehab Potential  (+) acute condition, motivated  (-) chronic condition    PT Frequency  2x / week    PT Treatment/Interventions  Iontophoresis 4mg /ml Dexamethasone;Patient/family education;Electrical Stimulation;Cryotherapy;Neuromuscular re-education;Manual techniques;Therapeutic exercise;Ultrasound;Moist Heat;Gait training;Passive range of motion;Therapeutic activities;Balance training    PT Next Visit Plan  Continue L LE strengthening as able; gait training    PT Home Exercise Plan  bridge exercise, SLR, scoot stretch, standing quad stretch    Consulted and Agree with Plan of Care  Patient       Patient will benefit from skilled therapeutic intervention in order to improve the following deficits and impairments:  Abnormal gait, Increased fascial restricitons, Pain, Improper body mechanics, Postural dysfunction, Decreased endurance, Decreased range of motion, Decreased activity tolerance, Decreased strength, Difficulty walking, Impaired flexibility  Visit Diagnosis: Type I or II open fracture of left tibial plateau with routine healing, subsequent encounter  Muscle weakness (generalized)  Difficulty in walking, not elsewhere classified  Pain in left hip     Problem List Patient Active Problem List   Diagnosis Date Noted  . Tibial plateau fracture 07/31/2017  . Chronic systolic heart failure ([place]) 10/08/2015  . Chronic obstructive pulmonary disease ([place]) 07/04/2015  . Controlled gout 06/06/2015  . Anxiety 02/09/2015  . Synovial cyst of popliteal space 02/09/2015  . Benign hypertension 02/09/2015  . Blood type A+ 02/09/2015  . Arterial branch occlusion of retina 02/09/2015  . Chronic constipation 02/09/2015  . Insomnia, persistent 02/09/2015  . Chronic combined systolic and diastolic CHF, NYHA class 1 ([place]) 02/09/2015  . Compromised kidney function 02/09/2015  .  Dyslipidemia 02/09/2015  . Atrial fibrillation ([place]) 02/09/2015  . Arthritis urica 02/09/2015  . Hearing loss 02/09/2015  . History of open heart surgery 02/09/2015  . Chronic hoarseness 02/09/2015  . Cardiomegaly 02/09/2015  . Dysmetabolic syndrome 85/88/5027  . Migraine with aura and without status migrainosus, not intractable 02/09/2015  . Adult BMI 30+ 02/09/2015  . Hypertensive pulmonary vascular disease ([place]) 02/09/2015  . Allergic rhinitis, seasonal 02/09/2015  . Vitamin D deficiency 02/09/2015  . History of mitral valve repair 10/01/2012  . History of aortic valve replacement 10/01/2012  . Congestive heart failure ([place]) 10/01/2012    Collie Siad PT, DPT 01/05/2018, 11:14 AM  [facility] [address], [place], [address] Phone: [phone]   Fax:  [phone]  Name: [name] MRN: [id number] Date of Birth: 16-Dec-1940

## 2018-01-06 ENCOUNTER — Ambulatory Visit: Payer: Medicare Other | Admitting: Physical Therapy

## 2018-01-07 ENCOUNTER — Ambulatory Visit: Payer: Medicare Other | Attending: Orthopedic Surgery | Admitting: Physical Therapy

## 2018-01-07 ENCOUNTER — Ambulatory Visit: Payer: Medicare Other | Admitting: Occupational Therapy

## 2018-01-07 ENCOUNTER — Encounter: Payer: Self-pay | Admitting: Physical Therapy

## 2018-01-07 DIAGNOSIS — M25532 Pain in left wrist: Secondary | ICD-10-CM

## 2018-01-07 DIAGNOSIS — M6281 Muscle weakness (generalized): Secondary | ICD-10-CM

## 2018-01-07 DIAGNOSIS — S82142E Displaced bicondylar fracture of left tibia, subsequent encounter for open fracture type I or II with routine healing: Secondary | ICD-10-CM

## 2018-01-07 DIAGNOSIS — M25632 Stiffness of left wrist, not elsewhere classified: Secondary | ICD-10-CM

## 2018-01-07 DIAGNOSIS — M25642 Stiffness of left hand, not elsewhere classified: Secondary | ICD-10-CM

## 2018-01-07 NOTE — Patient Instructions (Signed)
Same HEP  For digits , wrist ROM  And putty   but add 16oz hammer for wrist Rd, UD , wrist ext, flexion and sup /pro  10 reps and can increase to 2 sets if no pain -over weekend

## 2018-01-07 NOTE — Therapy (Signed)
[facility] [address] [place], [address] Phone: [phone]   Fax:  [phone]  Physical Therapy Treatment  Patient Details  Name: [name] MRN: [id number] Date of Birth: 02/03/1941 Referring Provider: Regis Bill   Encounter Date: 01/07/2018  PT End of Session - 01/07/18 0913    Visit Number  6    Number of Visits  17    Date for PT Re-Evaluation  02/10/18    PT Start Time  0904    PT Stop Time  0946    PT Time Calculation (min)  42 min    Activity Tolerance  Patient tolerated treatment well    Behavior During Therapy  [facility] for tasks assessed/performed       Past Medical History:  Diagnosis Date  . Atrial fibrillation ([place])   . Breast cancer ([place]) 1992   positive, radiation  . CHF (congestive heart failure) ([place])   . COPD (chronic obstructive pulmonary disease) ([place])   . History of aortic valve replacement   . Hyperlipidemia     Past Surgical History:  Procedure Laterality Date  . ABDOMINAL HYSTERECTOMY  1996  . BREAST BIOPSY Right 1998   neg  . BREAST BIOPSY Right 2007   neg  . BREAST BIOPSY Right 1992   positive  . BREAST EXCISIONAL BIOPSY Left 2000   neg  . CARDIAC VALVE REPLACEMENT  2014    There were no vitals filed for this visit.  Subjective Assessment - 01/07/18 0909    Subjective  Pt reports she is not having any pain today. Patient reports she feels as though she is walking better, but reports she is still having trouble with transitional movements, especially following prolonged stationary positioning    Pertinent History  Patient is a 77 y/o female s/p L ORIF tibial plateau fx following a fall down 5 steps Nov 2018, surgery Aug 27, 2017. Patient suffered another fall while in [place] preparing for tibial surgery, and fractured the distal L radius.  Patient had ORIF of L radius in Dec 26th. Patient reports she has had therapy in the SNF and at home over the past  4 months. Patient reports she was ambulating with walker with LUE support, and then began ambulating with single crutch at the end of Jan 2019, and has been walking without AD for the past 2 weeks. Patient reports she finished home PT last Friday and reports she has been "exercising her legs and walking" with home health PT. Patient reports worst pain in the past week: 1/10 which only comes on when she is getting into bed best: 0/10    Limitations  Lifting;Standing;Walking;House hold activities    How long can you sit comfortably?  unlimited    How long can you stand comfortably?  70min    How long can you walk comfortably?  30 min.    Patient Stated Goals  Wants to be able to go back to work and walk longer without pain    Pain Onset  More than a month ago          Ther-Ex -Nustep 4 min warmup (unbilled) -Stair knee flexion and ext stretch 30sec holds 3x each -Mini squat with red tband at knees 3x 10 to promote abductor recruitment and prevent knee valgus with external tactile cues, with min VC to prevent "knees over toes" and to ensure full hip ext -Hip ext "pushing" red tband down with L LE 3x  12 with cuing for eccentric control -OMEGA leg press 3x 12 10# with cuing for eccentric control -Lateral step down, pt unable to complete with proper form without pain so discontinued -Standing abd 3x 10 each alt L and R SLS with 2 finger UE support where pt demonstrates more compensation with SLS on L LE and is able to correct this with PT cuing 75%                   PT Education - 01/07/18 0913    Education provided  Yes    Education Details  Exercise form    Person(s) Educated  Patient    Methods  Explanation;Demonstration;Verbal cues    Comprehension  Verbal cues required;Verbalized understanding;Returned demonstration       PT Short Term Goals - 12/16/17 1551      PT SHORT TERM GOAL #1   Title  Pt will be independent with HEP in order to improve strength and balance in  order to decrease fall risk and improve function at home and work.    Time  2    Period  Weeks        PT Long Term Goals - 12/16/17 1552      PT LONG TERM GOAL #1   Title  Pt will increase LEFS by at least 9 points in order to demonstrate significant improvement in lower extremity function.[name]    Baseline  4/10 42%    Time  8    Period  Weeks    Status  New      PT LONG TERM GOAL #2   Title  Patient will increase FOTO score to 55 to demonstrate predicted increase in functional mobility to complete ADLs    Baseline  4/10 42    Time  8    Period  Weeks    Status  New      PT LONG TERM GOAL #3   Title  Pt will decrease 5TSTS by at least 3 seconds in order to demonstrate clinically significant improvement in LE strength    Baseline  4/10 15sec    Time  8    Period  Weeks    Status  New      PT LONG TERM GOAL #4   Title   Pt will increase 6MWT by at least 68m (125ft) in order to demonstrate clinically significant improvement in cardiopulmonary endurance and community ambulation    Baseline  To be done next visit    Time  8    Period  Weeks    Status  New            Plan - 01/07/18 4627    Clinical Impression Statement  PT led patient through therex with focus type 2 muscle fibers needed for transition STS movements, which patient was able to complete with proer form folliwng min cuing from PT, with no pain, only noted fatigue. Patient is continuing to demonstrate compensation with SLS on L LE with PT attempts to cue and pt is able to correct 75%, but is unable to fully correct d/t hip strength deficit- as evidenced in patients ability to perform L abduction standing on R leg smoothly with no "foot touchdown" and is unable to do so without UE support on L LE with R LE abd. Patient is able to increase resistance with strengthening and maintain proper form with PT cuing.      Rehab Potential  Good    Clinical Impairments Affecting  Rehab Potential  (+) acute condition,  motivated  (-) chronic condition    PT Frequency  2x / week    PT Duration  8 weeks    PT Treatment/Interventions  Iontophoresis 4mg /ml Dexamethasone;Patient/family education;Electrical Stimulation;Cryotherapy;Neuromuscular re-education;Manual techniques;Therapeutic exercise;Ultrasound;Moist Heat;Gait training;Passive range of motion;Therapeutic activities;Balance training    PT Next Visit Plan  Continue L LE strengthening as able; gait training    PT Home Exercise Plan  bridge exercise, SLR, scoot stretch, standing quad stretch    Consulted and Agree with Plan of Care  Patient       Patient will benefit from skilled therapeutic intervention in order to improve the following deficits and impairments:  Abnormal gait, Increased fascial restricitons, Pain, Improper body mechanics, Postural dysfunction, Decreased endurance, Decreased range of motion, Decreased activity tolerance, Decreased strength, Difficulty walking, Impaired flexibility  Visit Diagnosis: Type I or II open fracture of left tibial plateau with routine healing, subsequent encounter     Problem List Patient Active Problem List   Diagnosis Date Noted  . Tibial plateau fracture 07/31/2017  . Chronic systolic heart failure ([place]) 10/08/2015  . Chronic obstructive pulmonary disease ([place]) 07/04/2015  . Controlled gout 06/06/2015  . Anxiety 02/09/2015  . Synovial cyst of popliteal space 02/09/2015  . Benign hypertension 02/09/2015  . Blood type A+ 02/09/2015  . Arterial branch occlusion of retina 02/09/2015  . Chronic constipation 02/09/2015  . Insomnia, persistent 02/09/2015  . Chronic combined systolic and diastolic CHF, NYHA class 1 ([place]) 02/09/2015  . Compromised kidney function 02/09/2015  . Dyslipidemia 02/09/2015  . Atrial fibrillation ([place]) 02/09/2015  . Arthritis urica 02/09/2015  . Hearing loss 02/09/2015  . History of open heart surgery 02/09/2015  . Chronic hoarseness 02/09/2015  . Cardiomegaly 02/09/2015  .  Dysmetabolic syndrome 70/78/6754  . Migraine with aura and without status migrainosus, not intractable 02/09/2015  . Adult BMI 30+ 02/09/2015  . Hypertensive pulmonary vascular disease ([place]) 02/09/2015  . Allergic rhinitis, seasonal 02/09/2015  . Vitamin D deficiency 02/09/2015  . History of mitral valve repair 10/01/2012  . History of aortic valve replacement 10/01/2012  . Congestive heart failure ([place]) 10/01/2012   Shelton Silvas PT, DPT Shelton Silvas 01/07/2018, 11:17 AM  [facility] [address], [place], [address] Phone: [phone]   Fax:  [phone]  Name: [name] MRN: [id number] Date of Birth: Jan 20, 1941

## 2018-01-07 NOTE — Therapy (Signed)
[facility] [address], [place], [address] Phone: [phone]   Fax:  [phone]  Occupational Therapy Treatment  Patient Details  Name: [name] MRN: [id number] Date of Birth: 1941-03-11 Referring Provider: Regis Bill   Encounter Date: 01/07/2018  OT End of Session - 01/07/18 1310    Visit Number  3    Number of Visits  12    Date for OT Re-Evaluation  02/09/18    OT Start Time  0955    OT Stop Time  1040    OT Time Calculation (min)  45 min    Activity Tolerance  Patient tolerated treatment well    Behavior During Therapy  [facility] for tasks assessed/performed       Past Medical History:  Diagnosis Date  . Atrial fibrillation ([place])   . Breast cancer ([place]) 1992   positive, radiation  . CHF (congestive heart failure) ([place])   . COPD (chronic obstructive pulmonary disease) ([place])   . History of aortic valve replacement   . Hyperlipidemia     Past Surgical History:  Procedure Laterality Date  . ABDOMINAL HYSTERECTOMY  1996  . BREAST BIOPSY Right 1998   neg  . BREAST BIOPSY Right 2007   neg  . BREAST BIOPSY Right 1992   positive  . BREAST EXCISIONAL BIOPSY Left 2000   neg  . CARDIAC VALVE REPLACEMENT  2014    There were no vitals filed for this visit.  Subjective Assessment - 01/07/18 1307    Subjective   Pt report increase use of L hand but that wrist gets stiff when she goes back into splint -she is going back to work on 13th - and will get her nails cut this weekend     Patient Stated Goals  I need to be able to use my and to pick up , grip to cook , cater and play piano - i need to go back to work , I work in [employer]     Currently in Pain?  No/denies                   OT Treatments/Exercises (OP) - 01/07/18 0001      LUE Fluidotherapy   Number Minutes Fluidotherapy  10 Minutes    LUE Fluidotherapy Location  Hand;Wrist    Comments  AROM for wrist and digits in all  planes         Soft tissue mobs done to volar and dorsal forearm and digits - palm  - using Graston tool nr 2 - sweeping  CP spreads and  And MC spreads   Followed by Putty teal for gripping , lat and 3 point grip  Need mod A for review to do correct ly   Also PROM to digits extention followed by rolling of putty  - pt to hold for extention stretch to digits   10 reps   2 x day - not to over do it  Review with pt  Wrist AAROM for wrist flexion , ext, RD, UD - 10 reps   prior to 16oz hammer done for wrist ext, flexio, UD , RD , sup /pro   10 reps  Add to HEP - pt to not over do and pain free   CPM on BTE for wrist extention done 200 sec        OT Education - 01/07/18 1309    Education provided  Yes  Education Details  review HEP and add 16 oz hammer for wrist strengthening     Person(s) Educated  Patient    Methods  Demonstration;Tactile cues;Explanation;Handout    Comprehension  Verbalized understanding;Returned demonstration       OT Short Term Goals - 12/29/17 1901      OT SHORT TERM GOAL #1   Title  Pt to be independent in HEP to increase AROM in digits , wrist to use L hand in more than 50% of bathing , dressing , and cooking     Baseline  82% affected not using L hand     Time  3    Period  Weeks    Status  New    Target Date  01/19/18      OT SHORT TERM GOAL #2   Title  L digits AROM improve for pt to make tight fist and open digits to reach into pocket / play keyboard     Baseline  see flowsheet     Time  3    Period  Weeks    Status  New    Target Date  01/19/18      OT SHORT TERM GOAL #3   Title  L wrist AROM improve with more than 10-15 degrees to turn doorknob, bath, fasten bra, stir and cut in cooking     Baseline  see flowsheet     Time  4    Period  Weeks    Status  New    Target Date  01/26/18        OT Long Term Goals - 12/29/17 1904      OT LONG TERM GOAL #1   Title  Grip strength and prehension improve to more than 50% compare  to R hand to return to work    Baseline  NT     Time  6    Period  Weeks    Status  New    Target Date  02/09/18      OT LONG TERM GOAL #2   Title  Function score on PRWHE improve with more than 20 points     Baseline  at eval function score on PRWHE 41/50     Time  6    Period  Weeks    Status  New    Target Date  02/09/18      OT LONG TERM GOAL #3   Title  Strength in L wrist improve to 4+/5 to carry  and lift more than 5 lbs to perform her job , push door open     Baseline  strength decrease - with fisting unable to maintain wrist in neutral - plate removed 3 wks ago     Time  6    Period  Weeks    Status  New    Target Date  02/09/18            Plan - 01/07/18 1311    Clinical Impression Statement  Pt cont to show increase AROM in L wrist , digits flexion and able to add weight to wrist this date - report increase use of hand - and scar improving but still tender over dorsal hand scar     Occupational performance deficits (Please refer to evaluation for details):  ADL's;IADL's;Work;Play;Leisure    Rehab Potential  Good    Current Impairments/barriers affecting progress:  Pt was in cast or splint since 09/02/17 -     OT Frequency  2x /  week    OT Duration  6 weeks    OT Treatment/Interventions  Self-care/ADL training;Patient/family education;Paraffin;Fluidtherapy;Scar mobilization;Manual Therapy;Therapeutic exercise;Passive range of motion    Plan  assess progress with HEP - 16oz hammer and putty     Clinical Decision Making  Multiple treatment options, significant modification of task necessary    OT Home Exercise Plan  see pt instruction    Consulted and Agree with Plan of Care  Patient       Patient will benefit from skilled therapeutic intervention in order to improve the following deficits and impairments:  Impaired flexibility, Decreased scar mobility, Decreased strength, Decreased range of motion, Impaired UE functional use  Visit Diagnosis: Muscle weakness  (generalized)  Stiffness of left wrist, not elsewhere classified  Stiffness of left hand, not elsewhere classified  Pain in left wrist    Problem List Patient Active Problem List   Diagnosis Date Noted  . Tibial plateau fracture 07/31/2017  . Chronic systolic heart failure ([place]) 10/08/2015  . Chronic obstructive pulmonary disease ([place]) 07/04/2015  . Controlled gout 06/06/2015  . Anxiety 02/09/2015  . Synovial cyst of popliteal space 02/09/2015  . Benign hypertension 02/09/2015  . Blood type A+ 02/09/2015  . Arterial branch occlusion of retina 02/09/2015  . Chronic constipation 02/09/2015  . Insomnia, persistent 02/09/2015  . Chronic combined systolic and diastolic CHF, NYHA class 1 ([place]) 02/09/2015  . Compromised kidney function 02/09/2015  . Dyslipidemia 02/09/2015  . Atrial fibrillation ([place]) 02/09/2015  . Arthritis urica 02/09/2015  . Hearing loss 02/09/2015  . History of open heart surgery 02/09/2015  . Chronic hoarseness 02/09/2015  . Cardiomegaly 02/09/2015  . Dysmetabolic syndrome 25/63/8937  . Migraine with aura and without status migrainosus, not intractable 02/09/2015  . Adult BMI 30+ 02/09/2015  . Hypertensive pulmonary vascular disease ([place]) 02/09/2015  . Allergic rhinitis, seasonal 02/09/2015  . Vitamin D deficiency 02/09/2015  . History of mitral valve repair 10/01/2012  . History of aortic valve replacement 10/01/2012  . Congestive heart failure ([place]) 10/01/2012    Rosalyn Gess OTR/L,CLT 01/07/2018, 1:13 PM  [facility] [address], [place], [address] Phone: [phone]   Fax:  [phone]  Name: [name] MRN: [id number] Date of Birth: [birth year]

## 2018-01-11 ENCOUNTER — Encounter: Payer: Medicare Other | Admitting: Physical Therapy

## 2018-01-11 ENCOUNTER — Encounter: Payer: Self-pay | Admitting: Physical Therapy

## 2018-01-11 ENCOUNTER — Ambulatory Visit: Payer: Medicare Other | Admitting: Physical Therapy

## 2018-01-11 ENCOUNTER — Ambulatory Visit: Payer: Medicare Other | Admitting: Occupational Therapy

## 2018-01-11 DIAGNOSIS — Z9889 Other specified postprocedural states: Secondary | ICD-10-CM | POA: Diagnosis not present

## 2018-01-11 DIAGNOSIS — R000 Tachycardia, unspecified: Secondary | ICD-10-CM | POA: Diagnosis not present

## 2018-01-11 DIAGNOSIS — I10 Essential (primary) hypertension: Secondary | ICD-10-CM | POA: Diagnosis not present

## 2018-01-11 DIAGNOSIS — I480 Paroxysmal atrial fibrillation: Secondary | ICD-10-CM | POA: Diagnosis not present

## 2018-01-11 DIAGNOSIS — M6281 Muscle weakness (generalized): Secondary | ICD-10-CM

## 2018-01-11 DIAGNOSIS — M1A00X0 Idiopathic chronic gout, unspecified site, without tophus (tophi): Secondary | ICD-10-CM | POA: Diagnosis not present

## 2018-01-11 DIAGNOSIS — E7849 Other hyperlipidemia: Secondary | ICD-10-CM | POA: Diagnosis not present

## 2018-01-11 DIAGNOSIS — M25632 Stiffness of left wrist, not elsewhere classified: Secondary | ICD-10-CM

## 2018-01-11 DIAGNOSIS — Z952 Presence of prosthetic heart valve: Secondary | ICD-10-CM | POA: Diagnosis not present

## 2018-01-11 DIAGNOSIS — R011 Cardiac murmur, unspecified: Secondary | ICD-10-CM | POA: Diagnosis not present

## 2018-01-11 DIAGNOSIS — E669 Obesity, unspecified: Secondary | ICD-10-CM | POA: Diagnosis not present

## 2018-01-11 DIAGNOSIS — Z8774 Personal history of (corrected) congenital malformations of heart and circulatory system: Secondary | ICD-10-CM | POA: Diagnosis not present

## 2018-01-11 DIAGNOSIS — R0602 Shortness of breath: Secondary | ICD-10-CM | POA: Diagnosis not present

## 2018-01-11 DIAGNOSIS — M25642 Stiffness of left hand, not elsewhere classified: Secondary | ICD-10-CM

## 2018-01-11 DIAGNOSIS — D6832 Hemorrhagic disorder due to extrinsic circulating anticoagulants: Secondary | ICD-10-CM | POA: Diagnosis not present

## 2018-01-11 DIAGNOSIS — M25532 Pain in left wrist: Secondary | ICD-10-CM

## 2018-01-11 NOTE — Patient Instructions (Signed)
Same but focus on wrist extention PROM , and scar mobs to volar wrist scar

## 2018-01-11 NOTE — Therapy (Signed)
[facility] [address] [place], [address] Phone: [phone]   Fax:  [phone]  Occupational Therapy Treatment  Patient Details  Name: [name] MRN: [id number] Date of Birth: 14-Apr-1941 Referring Provider: Regis Bill   Encounter Date: 01/11/2018  OT End of Session - 01/11/18 1332    Visit Number  4    Number of Visits  12    Date for OT Re-Evaluation  02/09/18    OT Start Time  1309    OT Stop Time  1342    OT Time Calculation (min)  33 min    Activity Tolerance  Patient tolerated treatment well    Behavior During Therapy  [facility] for tasks assessed/performed       Past Medical History:  Diagnosis Date  . Atrial fibrillation ([place])   . Breast cancer ([place]) 1992   positive, radiation  . CHF (congestive heart failure) ([place])   . COPD (chronic obstructive pulmonary disease) ([place])   . History of aortic valve replacement   . Hyperlipidemia     Past Surgical History:  Procedure Laterality Date  . ABDOMINAL HYSTERECTOMY  1996  . BREAST BIOPSY Right 1998   neg  . BREAST BIOPSY Right 2007   neg  . BREAST BIOPSY Right 1992   positive  . BREAST EXCISIONAL BIOPSY Left 2000   neg  . CARDIAC VALVE REPLACEMENT  2014    There were no vitals filed for this visit.  Subjective Assessment - 01/11/18 1321    Subjective   Going back to work Monday - and I am using it more - I did do the putty and hammer but only one time day - but not Friday     Patient Stated Goals  I need to be able to use my and to pick up , grip to cook , cater and play piano - i need to go back to work , I work in [employer]     Currently in Pain?  No/denies         [facility] OT Assessment - 01/11/18 0001      AROM   Left Wrist Extension  45 Degrees    Left Wrist Flexion  50 Degrees      Strength   Right Hand Grip (lbs)  65    Right Hand Lateral Pinch  14 lbs    Right Hand 3 Point Pinch  14 lbs    Left Hand Grip (lbs)  21     Left Hand Lateral Pinch  11 lbs    Left Hand 3 Point Pinch  6 lbs        Measure grip and prehension strength - see flow sheet  and AROM for wrist ext, and flexion - but did not improve with extention   need to work on PROM extnetion and scar tissue        OT Treatments/Exercises (OP) - 01/11/18 0001      LUE Fluidotherapy   Number Minutes Fluidotherapy  10 Minutes    LUE Fluidotherapy Location  Hand;Wrist    Comments  AROM for wrist in all planes          Soft tissue mobs done to volar and dorsal forearm and digits - palm - using Graston tool nr 2 - sweeping - ed pt on scar massage on volar wrist scar and  cica scar pad use  CP spreads and And MC spreads   Cont with  Putty teal for gripping , lat and 3 point grip     Cont with  PROM to digits extention followed by rolling of putty  - pt to hold for extention stretch to digits  10 reps  2 x day - not to over do it  Reinforce to do  Wrist AAROM for wrist flexion , ext, RD, UD - 10 reps  And then review use of  16oz hammer  for wrist ext, flexio, UD , RD , sup /pro  10 reps  Needed min A  Pt to do 2 sets day    pt to not over do and pain free   CPM on BTE for wrist extention done 200 sec        OT Education - 01/11/18 1332    Education provided  Yes    Education Details  Scar , focus on wrist extention PROM - and review hammer again     Person(s) Educated  Patient    Methods  Explanation;Demonstration    Comprehension  Returned demonstration       OT Short Term Goals - 12/29/17 1901      OT SHORT TERM GOAL #1   Title  Pt to be independent in HEP to increase AROM in digits , wrist to use L hand in more than 50% of bathing , dressing , and cooking     Baseline  82% affected not using L hand     Time  3    Period  Weeks    Status  New    Target Date  01/19/18      OT SHORT TERM GOAL #2   Title  L digits AROM improve for pt to make tight fist and open digits to reach into pocket / play  keyboard     Baseline  see flowsheet     Time  3    Period  Weeks    Status  New    Target Date  01/19/18      OT SHORT TERM GOAL #3   Title  L wrist AROM improve with more than 10-15 degrees to turn doorknob, bath, fasten bra, stir and cut in cooking     Baseline  see flowsheet     Time  4    Period  Weeks    Status  New    Target Date  01/26/18        OT Long Term Goals - 12/29/17 1904      OT LONG TERM GOAL #1   Title  Grip strength and prehension improve to more than 50% compare to R hand to return to work    Baseline  NT     Time  6    Period  Weeks    Status  New    Target Date  02/09/18      OT LONG TERM GOAL #2   Title  Function score on PRWHE improve with more than 20 points     Baseline  at eval function score on PRWHE 41/50     Time  6    Period  Weeks    Status  New    Target Date  02/09/18      OT LONG TERM GOAL #3   Title  Strength in L wrist improve to 4+/5 to carry  and lift more than 5 lbs to perform her job , push door open     Baseline  strength decrease - with fisting unable  to maintain wrist in neutral - plate removed 3 wks ago     Time  6    Period  Weeks    Status  New    Target Date  02/09/18            Plan - 01/11/18 1333    Clinical Impression Statement  Pt show progress in functional use , strength and ROM in flexion more than extention - ed on scar mobs and PROM to focus on wrist extention 2x day atleast and need to increase strengthening to 2 sets      Occupational performance deficits (Please refer to evaluation for details):  ADL's;IADL's;Work;Play;Leisure    Rehab Potential  Good    Current Impairments/barriers affecting progress:  Pt was in cast or splint since 09/02/17 -     OT Frequency  2x / week    OT Duration  4 weeks    OT Treatment/Interventions  Self-care/ADL training;Patient/family education;Paraffin;Fluidtherapy;Scar mobilization;Manual Therapy;Therapeutic exercise;Passive range of motion    Clinical Decision  Making  Multiple treatment options, significant modification of task necessary    OT Home Exercise Plan  see pt instruction    Consulted and Agree with Plan of Care  Patient       Patient will benefit from skilled therapeutic intervention in order to improve the following deficits and impairments:  Impaired flexibility, Decreased scar mobility, Decreased strength, Decreased range of motion, Impaired UE functional use  Visit Diagnosis: Muscle weakness (generalized)  Stiffness of left wrist, not elsewhere classified  Stiffness of left hand, not elsewhere classified  Pain in left wrist    Problem List Patient Active Problem List   Diagnosis Date Noted  . Tibial plateau fracture 07/31/2017  . Chronic systolic heart failure ([place]) 10/08/2015  . Chronic obstructive pulmonary disease ([place]) 07/04/2015  . Controlled gout 06/06/2015  . Anxiety 02/09/2015  . Synovial cyst of popliteal space 02/09/2015  . Benign hypertension 02/09/2015  . Blood type A+ 02/09/2015  . Arterial branch occlusion of retina 02/09/2015  . Chronic constipation 02/09/2015  . Insomnia, persistent 02/09/2015  . Chronic combined systolic and diastolic CHF, NYHA class 1 ([place]) 02/09/2015  . Compromised kidney function 02/09/2015  . Dyslipidemia 02/09/2015  . Atrial fibrillation ([place]) 02/09/2015  . Arthritis urica 02/09/2015  . Hearing loss 02/09/2015  . History of open heart surgery 02/09/2015  . Chronic hoarseness 02/09/2015  . Cardiomegaly 02/09/2015  . Dysmetabolic syndrome 81/09/7508  . Migraine with aura and without status migrainosus, not intractable 02/09/2015  . Adult BMI 30+ 02/09/2015  . Hypertensive pulmonary vascular disease ([place]) 02/09/2015  . Allergic rhinitis, seasonal 02/09/2015  . Vitamin D deficiency 02/09/2015  . History of mitral valve repair 10/01/2012  . History of aortic valve replacement 10/01/2012  . Congestive heart failure ([place]) 10/01/2012    Kaelea Gathright OTR/L,CLT 01/11/2018,  1:50 PM  [facility] [address] [place], [address] Phone: [phone]   Fax:  [phone]  Name: SAMMY DOUTHITT MRN: [id number] Date of Birth: 09-25-1940

## 2018-01-11 NOTE — Therapy (Signed)
[facility] [address], [place], [address] Phone: [phone]   Fax:  [phone]  Physical Therapy Treatment  Patient Details  Name: [name] MRN: [id number] Date of Birth: December 14, 1940 Referring Provider: Regis Bill   Encounter Date: 01/11/2018    Past Medical History:  Diagnosis Date  . Atrial fibrillation ([place])   . Breast cancer ([place]) 1992   positive, radiation  . CHF (congestive heart failure) ([place])   . COPD (chronic obstructive pulmonary disease) ([place])   . History of aortic valve replacement   . Hyperlipidemia     Past Surgical History:  Procedure Laterality Date  . ABDOMINAL HYSTERECTOMY  1996  . BREAST BIOPSY Right 1998   neg  . BREAST BIOPSY Right 2007   neg  . BREAST BIOPSY Right 1992   positive  . BREAST EXCISIONAL BIOPSY Left 2000   neg  . CARDIAC VALVE REPLACEMENT  2014    There were no vitals filed for this visit.       Ther-Ex -Nustep 4 min during hx intake  -Matrix hip abd 25# with cuing for eccentric control -Matrix hip abd 40# with cuing for eccentric control and proper posture  -Hip ext "pushing" red tband down with L LE 3x 12 with cuing for eccentric control -Lateral step downs 3x 10 50% ROM with cuing for eccentric control -Forward step ups 3x 10 with cuing to prevent L hip drop                        PT Short Term Goals - 12/16/17 1551      PT SHORT TERM GOAL #1   Title  Pt will be independent with HEP in order to improve strength and balance in order to decrease fall risk and improve function at home and work.    Time  2    Period  Weeks        PT Long Term Goals - 12/16/17 1552      PT LONG TERM GOAL #1   Title  Pt will increase LEFS by at least 9 points in order to demonstrate significant improvement in lower extremity function.[facility]    Baseline  4/10 42%    Time  8    Period  Weeks    Status  New      PT LONG TERM GOAL  #2   Title  Patient will increase FOTO score to 55 to demonstrate predicted increase in functional mobility to complete ADLs    Baseline  4/10 42    Time  8    Period  Weeks    Status  New      PT LONG TERM GOAL #3   Title  Pt will decrease 5TSTS by at least 3 seconds in order to demonstrate clinically significant improvement in LE strength    Baseline  4/10 15sec    Time  8    Period  Weeks    Status  New      PT LONG TERM GOAL #4   Title   Pt will increase 6MWT by at least 38m (129ft) in order to demonstrate clinically significant improvement in cardiopulmonary endurance and community ambulation    Baseline  To be done next visit    Time  8    Period  Weeks    Status  New              Patient  will benefit from skilled therapeutic intervention in order to improve the following deficits and impairments:     Visit Diagnosis: No diagnosis found.     Problem List Patient Active Problem List   Diagnosis Date Noted  . Tibial plateau fracture 07/31/2017  . Chronic systolic heart failure ([place]) 10/08/2015  . Chronic obstructive pulmonary disease ([place]) 07/04/2015  . Controlled gout 06/06/2015  . Anxiety 02/09/2015  . Synovial cyst of popliteal space 02/09/2015  . Benign hypertension 02/09/2015  . Blood type A+ 02/09/2015  . Arterial branch occlusion of retina 02/09/2015  . Chronic constipation 02/09/2015  . Insomnia, persistent 02/09/2015  . Chronic combined systolic and diastolic CHF, NYHA class 1 ([place]) 02/09/2015  . Compromised kidney function 02/09/2015  . Dyslipidemia 02/09/2015  . Atrial fibrillation ([place]) 02/09/2015  . Arthritis urica 02/09/2015  . Hearing loss 02/09/2015  . History of open heart surgery 02/09/2015  . Chronic hoarseness 02/09/2015  . Cardiomegaly 02/09/2015  . Dysmetabolic syndrome 01/00/7121  . Migraine with aura and without status migrainosus, not intractable 02/09/2015  . Adult BMI 30+ 02/09/2015  . Hypertensive pulmonary vascular  disease ([place]) 02/09/2015  . Allergic rhinitis, seasonal 02/09/2015  . Vitamin D deficiency 02/09/2015  . History of mitral valve repair 10/01/2012  . History of aortic valve replacement 10/01/2012  . Congestive heart failure ([place]) 10/01/2012   Shelton Silvas PT, DPT Shelton Silvas 01/11/2018, 1:51 PM  [facility] [address] [place], [address] Phone: [phone]   Fax:  [phone]  Name: [name] MRN: [id number] Date of Birth: 07-Feb-1941

## 2018-01-12 ENCOUNTER — Encounter: Payer: Medicare Other | Admitting: Occupational Therapy

## 2018-01-12 ENCOUNTER — Encounter: Payer: Medicare Other | Admitting: Physical Therapy

## 2018-01-13 ENCOUNTER — Ambulatory Visit: Payer: Medicare Other | Admitting: Occupational Therapy

## 2018-01-13 ENCOUNTER — Encounter: Payer: Medicare Other | Admitting: Physical Therapy

## 2018-01-13 DIAGNOSIS — M6281 Muscle weakness (generalized): Secondary | ICD-10-CM

## 2018-01-13 DIAGNOSIS — M25532 Pain in left wrist: Secondary | ICD-10-CM | POA: Diagnosis not present

## 2018-01-13 DIAGNOSIS — M25642 Stiffness of left hand, not elsewhere classified: Secondary | ICD-10-CM

## 2018-01-13 DIAGNOSIS — M25632 Stiffness of left wrist, not elsewhere classified: Secondary | ICD-10-CM

## 2018-01-13 NOTE — Patient Instructions (Signed)
Pt to return to work Monday - if pain more than 2-3/10 - back off try again in few days Cont with PROM for wrist flexion ,ext, RD, UD  And prayer stretch  Tendon glides  And putty for gripping and prehension   2 x day

## 2018-01-13 NOTE — Therapy (Signed)
[facility] [address], [place], [address] Phone: [phone]   Fax:  [phone]  Occupational Therapy Treatment  Patient Details  Name: [name] MRN: [id number] Date of Birth: 1941-02-18 Referring Provider: Regis Bill   Encounter Date: 01/13/2018  OT End of Session - 01/13/18 1206    Visit Number  5    Number of Visits  12    Date for OT Re-Evaluation  02/09/18    OT Start Time  1115    OT Stop Time  1155    OT Time Calculation (min)  40 min    Activity Tolerance  Patient tolerated treatment well    Behavior During Therapy  [facility] for tasks assessed/performed       Past Medical History:  Diagnosis Date  . Atrial fibrillation ([place])   . Breast cancer ([place]) 1992   positive, radiation  . CHF (congestive heart failure) ([place])   . COPD (chronic obstructive pulmonary disease) ([place])   . History of aortic valve replacement   . Hyperlipidemia     Past Surgical History:  Procedure Laterality Date  . ABDOMINAL HYSTERECTOMY  1996  . BREAST BIOPSY Right 1998   neg  . BREAST BIOPSY Right 2007   neg  . BREAST BIOPSY Right 1992   positive  . BREAST EXCISIONAL BIOPSY Left 2000   neg  . CARDIAC VALVE REPLACEMENT  2014    There were no vitals filed for this visit.  Subjective Assessment - 01/13/18 1202    Subjective   I am just down since this accident - no myself - do not feel like I am getting better- still cannot make fist -  I can pick up chicken with both hands- my pocket book - I am going back to work Monday     Patient Stated Goals  I need to be able to use my and to pick up , grip to cook , cater and play piano - i need to go back to work , I work in [employer]     Currently in Pain?  No/denies         [facility] OT Assessment - 01/13/18 0001      AROM   Left Wrist Extension  50 Degrees    Left Wrist Flexion  55 Degrees    Left Wrist Radial Deviation  20 Degrees    Left Wrist Ulnar  Deviation  20 Degrees      Left Hand AROM   L Index  MCP 0-90  70 Degrees    L Index PIP 0-100  85 Degrees    L Long  MCP 0-90  80 Degrees    L Long PIP 0-100  90 Degrees    L Ring  MCP 0-90  85 Degrees    L Ring PIP 0-100  95 Degrees    L Little  MCP 0-90  90 Degrees    L Little PIP 0-100  95 Degrees       Assess progress for ROM since [facility] - showed good progress   pt was little discourage at beginning of session -but was much happier end after seeing progress  And remind pt it can take 6 months to year to recover fully and edema to decrease - she had 2nd surgery to remove bridge   STrength in wrist ROM - on plane of ROM she has 4+/5 without pain         OT  Treatments/Exercises (OP) - 01/13/18 0001      LUE Paraffin   Number Minutes Paraffin  10 Minutes    LUE Paraffin Location  Hand;Wrist    Comments  Prior to review of HEP to focus on and soft tissue mobs /scar massage              OT Education - 01/13/18 1205    Education provided  Yes    Education Details  HE to focus on - mostly ROM and grip strength    Person(s) Educated  Patient    Methods  Explanation;Demonstration    Comprehension  Verbalized understanding;Returned demonstration     Soft tissue mobs done to volar and dorsal forearm and digits - palm - using Graston tool nr 2 - sweeping  CP spreads and And MC spreads Scar massage and mobs to all scars   Tendon glides AROM - done and AAROM  10 reps  Pt to do at home and cont with    Putty teal for gripping , lat and 3 point grip    Review and pt to cont with   Wrist AAROM for wrist flexion , ext, RD, UD - 10 reps   and then also prayer stretch and wrist flexion stretch  10 reps     OT Short Term Goals - 12/29/17 1901      OT SHORT TERM GOAL #1   Title  Pt to be independent in HEP to increase AROM in digits , wrist to use L hand in more than 50% of bathing , dressing , and cooking     Baseline  82% affected not using L hand     Time  3     Period  Weeks    Status  New    Target Date  01/19/18      OT SHORT TERM GOAL #2   Title  L digits AROM improve for pt to make tight fist and open digits to reach into pocket / play keyboard     Baseline  see flowsheet     Time  3    Period  Weeks    Status  New    Target Date  01/19/18      OT SHORT TERM GOAL #3   Title  L wrist AROM improve with more than 10-15 degrees to turn doorknob, bath, fasten bra, stir and cut in cooking     Baseline  see flowsheet     Time  4    Period  Weeks    Status  New    Target Date  01/26/18        OT Long Term Goals - 12/29/17 1904      OT LONG TERM GOAL #1   Title  Grip strength and prehension improve to more than 50% compare to R hand to return to work    Baseline  NT     Time  6    Period  Weeks    Status  New    Target Date  02/09/18      OT LONG TERM GOAL #2   Title  Function score on PRWHE improve with more than 20 points     Baseline  at eval function score on PRWHE 41/50     Time  6    Period  Weeks    Status  New    Target Date  02/09/18      OT LONG TERM GOAL #3   Title  Strength in L wrist improve to 4+/5 to carry  and lift more than 5 lbs to perform her job , push door open     Baseline  strength decrease - with fisting unable to maintain wrist in neutral - plate removed 3 wks ago     Time  6    Period  Weeks    Status  New    Target Date  02/09/18            Plan - 01/13/18 1207    Clinical Impression Statement  Pt showed great progress in wrist flexion , extention since [facility] - and RD/UD - pt still limited in exention more than flexion - and digits flexoin , scar tissue and grip strenght -but wrist strength in  range of motion she has is 4+/5 = pt returning to work Monday and review with pt the HEP  that she has to focus on     Occupational performance deficits (Please refer to evaluation for details):  ADL's;IADL's;Work;Play;Leisure    Rehab Potential  Good    Current Impairments/barriers affecting  progress:  Pt was in cast or splint since 09/02/17 -     OT Frequency  2x / week    OT Duration  2 weeks    OT Treatment/Interventions  Self-care/ADL training;Patient/family education;Paraffin;Fluidtherapy;Scar mobilization;Manual Therapy;Therapeutic exercise;Passive range of motion    Plan  Check how returning to work went and progress in ROM /strength     Clinical Decision Making  Multiple treatment options, significant modification of task necessary    OT Home Exercise Plan  see pt instruction    Consulted and Agree with Plan of Care  Patient       Patient will benefit from skilled therapeutic intervention in order to improve the following deficits and impairments:  Impaired flexibility, Decreased scar mobility, Decreased strength, Decreased range of motion, Impaired UE functional use  Visit Diagnosis: Stiffness of left wrist, not elsewhere classified  Stiffness of left hand, not elsewhere classified  Pain in left wrist  Muscle weakness (generalized)    Problem List Patient Active Problem List   Diagnosis Date Noted  . Tibial plateau fracture 07/31/2017  . Chronic systolic heart failure ([place]) 10/08/2015  . Chronic obstructive pulmonary disease ([place]) 07/04/2015  . Controlled gout 06/06/2015  . Anxiety 02/09/2015  . Synovial cyst of popliteal space 02/09/2015  . Benign hypertension 02/09/2015  . Blood type A+ 02/09/2015  . Arterial branch occlusion of retina 02/09/2015  . Chronic constipation 02/09/2015  . Insomnia, persistent 02/09/2015  . Chronic combined systolic and diastolic CHF, NYHA class 1 ([place]) 02/09/2015  . Compromised kidney function 02/09/2015  . Dyslipidemia 02/09/2015  . Atrial fibrillation ([place]) 02/09/2015  . Arthritis urica 02/09/2015  . Hearing loss 02/09/2015  . History of open heart surgery 02/09/2015  . Chronic hoarseness 02/09/2015  . Cardiomegaly 02/09/2015  . Dysmetabolic syndrome 46/96/2952  . Migraine with aura and without status migrainosus,  not intractable 02/09/2015  . Adult BMI 30+ 02/09/2015  . Hypertensive pulmonary vascular disease ([place]) 02/09/2015  . Allergic rhinitis, seasonal 02/09/2015  . Vitamin D deficiency 02/09/2015  . History of mitral valve repair 10/01/2012  . History of aortic valve replacement 10/01/2012  . Congestive heart failure ([place]) 10/01/2012    Vincen Bejar OTR/L, CLT 01/13/2018, 12:10 PM  [facility] [address], [place], [address] Phone: [phone]   Fax:  [phone]  Name: [name] MRN: [id number] Date  of Birth: 11/09/1940

## 2018-01-14 ENCOUNTER — Ambulatory Visit: Payer: Medicare Other | Admitting: Physical Therapy

## 2018-01-15 ENCOUNTER — Encounter: Payer: Medicare Other | Admitting: Occupational Therapy

## 2018-01-18 ENCOUNTER — Encounter: Payer: Medicare Other | Admitting: Physical Therapy

## 2018-01-18 DIAGNOSIS — S52502D Unspecified fracture of the lower end of left radius, subsequent encounter for closed fracture with routine healing: Secondary | ICD-10-CM | POA: Diagnosis not present

## 2018-01-18 DIAGNOSIS — Y33XXXD Other specified events, undetermined intent, subsequent encounter: Secondary | ICD-10-CM | POA: Diagnosis not present

## 2018-01-19 ENCOUNTER — Ambulatory Visit: Payer: Medicare Other | Admitting: Physical Therapy

## 2018-01-19 ENCOUNTER — Ambulatory Visit: Payer: Medicare Other | Admitting: Occupational Therapy

## 2018-01-20 ENCOUNTER — Encounter: Payer: Medicare Other | Admitting: Physical Therapy

## 2018-01-21 ENCOUNTER — Ambulatory Visit: Payer: Medicare Other | Admitting: Physical Therapy

## 2018-01-21 ENCOUNTER — Ambulatory Visit: Payer: Medicare Other | Admitting: Occupational Therapy

## 2018-01-25 ENCOUNTER — Ambulatory Visit: Payer: Medicare Other | Admitting: Occupational Therapy

## 2018-01-25 DIAGNOSIS — M25532 Pain in left wrist: Secondary | ICD-10-CM

## 2018-01-25 DIAGNOSIS — M25632 Stiffness of left wrist, not elsewhere classified: Secondary | ICD-10-CM

## 2018-01-25 DIAGNOSIS — M6281 Muscle weakness (generalized): Secondary | ICD-10-CM

## 2018-01-25 DIAGNOSIS — M25642 Stiffness of left hand, not elsewhere classified: Secondary | ICD-10-CM

## 2018-01-25 NOTE — Therapy (Signed)
[facility] [address] [place], [address] Phone: [phone]   Fax:  [phone]  Occupational Therapy Treatment  Patient Details  Name: [name] MRN: [id number] Date of Birth: 12-31-1940 Referring Provider: Regis Bill   Encounter Date: 01/25/2018  OT End of Session - 01/25/18 1456    Number of Visits  12    Date for OT Re-Evaluation  02/09/18    OT Start Time  1416    OT Stop Time  1448    OT Time Calculation (min)  32 min    Activity Tolerance  Patient tolerated treatment well    Behavior During Therapy  [facility] for tasks assessed/performed       Past Medical History:  Diagnosis Date  . Atrial fibrillation ([place])   . Breast cancer ([place]) 1992   positive, radiation  . CHF (congestive heart failure) ([place])   . COPD (chronic obstructive pulmonary disease) ([place])   . History of aortic valve replacement   . Hyperlipidemia     Past Surgical History:  Procedure Laterality Date  . ABDOMINAL HYSTERECTOMY  1996  . BREAST BIOPSY Right 1998   neg  . BREAST BIOPSY Right 2007   neg  . BREAST BIOPSY Right 1992   positive  . BREAST EXCISIONAL BIOPSY Left 2000   neg  . CARDIAC VALVE REPLACEMENT  2014    There were no vitals filed for this visit.  Subjective Assessment - 01/25/18 1454    Subjective   I started back to work and it felt so good- I am using it more and the surgeon was very happy with my progress- and release to work with pulling , lifting to tolerance     Patient Stated Goals  I need to be able to use my and to pick up , grip to cook , cater and play piano - i need to go back to work , I work in [employer]     Currently in Pain?  No/denies         [facility] OT Assessment - 01/25/18 0001      AROM   Left Wrist Extension  48 Degrees    Left Wrist Flexion  62 Degrees    Left Wrist Radial Deviation  20 Degrees    Left Wrist Ulnar Deviation  22 Degrees      Strength   Right Hand Grip  (lbs)  65    Right Hand Lateral Pinch  14 lbs    Right Hand 3 Point Pinch  14 lbs    Left Hand Grip (lbs)  29    Left Hand Lateral Pinch  12.5 lbs    Left Hand 3 Point Pinch  7.5 lbs        Assess progress for ROM since [facility] - showed good progress     STrength in wrist ROM - on plane of ROM she has 4+/5 without pain  Wrist extention did not improve      Review with pt   Wrist extention stretches  for wall and table slides- 5 x 30 sec  Prayer stretch  On table - min A needed  5 x day for 30 sec x5  To do at home  And then cont with flexion at wrist stretch  Upgrade putty to teal - cont gripping, lat and 3 point grip Need min A for 3 point grip  OT Education - 01/25/18 1455    Education provided  Yes    Education Details  Cont with HEP - focus on wrist extention and increase use     Person(s) Educated  Patient    Methods  Explanation;Demonstration;Tactile cues    Comprehension  Verbalized understanding;Returned demonstration       OT Short Term Goals - 12/29/17 1901      OT SHORT TERM GOAL #1   Title  Pt to be independent in HEP to increase AROM in digits , wrist to use L hand in more than 50% of bathing , dressing , and cooking     Baseline  82% affected not using L hand     Time  3    Period  Weeks    Status  New    Target Date  01/19/18      OT SHORT TERM GOAL #2   Title  L digits AROM improve for pt to make tight fist and open digits to reach into pocket / play keyboard     Baseline  see flowsheet     Time  3    Period  Weeks    Status  New    Target Date  01/19/18      OT SHORT TERM GOAL #3   Title  L wrist AROM improve with more than 10-15 degrees to turn doorknob, bath, fasten bra, stir and cut in cooking     Baseline  see flowsheet     Time  4    Period  Weeks    Status  New    Target Date  01/26/18        OT Long Term Goals - 12/29/17 1904      OT LONG TERM GOAL #1   Title  Grip strength and prehension improve to more than  50% compare to R hand to return to work    Baseline  NT     Time  6    Period  Weeks    Status  New    Target Date  02/09/18      OT LONG TERM GOAL #2   Title  Function score on PRWHE improve with more than 20 points     Baseline  at eval function score on PRWHE 41/50     Time  6    Period  Weeks    Status  New    Target Date  02/09/18      OT LONG TERM GOAL #3   Title  Strength in L wrist improve to 4+/5 to carry  and lift more than 5 lbs to perform her job , push door open     Baseline  strength decrease - with fisting unable to maintain wrist in neutral - plate removed 3 wks ago     Time  6    Period  Weeks    Status  New    Target Date  02/09/18            Plan - 01/25/18 1456    Clinical Impression Statement  Pt cont to show progress in grip and lat grip -and wrist flexion - Wrist extnetion did not improve since last seen - pt to increase wrist extnetion stretch to 5 x day - and cont functional strenght for being back to work and cont with putty - will follow up with pt in 2 wks     Occupational performance deficits (Please refer to evaluation for details):  ADL's;IADL's;Work;Play;Leisure    Rehab Potential  Good    Current Impairments/barriers affecting progress:  Pt was in cast or splint since 09/02/17 -     OT Frequency  Biweekly    OT Duration  2 weeks    OT Treatment/Interventions  Self-care/ADL training;Patient/family education;Paraffin;Fluidtherapy;Scar mobilization;Manual Therapy;Therapeutic exercise;Passive range of motion    Plan  follow up in 2 wks - progress ? in wrist extention     Clinical Decision Making  Multiple treatment options, significant modification of task necessary    OT Home Exercise Plan  see pt instruction    Consulted and Agree with Plan of Care  Patient       Patient will benefit from skilled therapeutic intervention in order to improve the following deficits and impairments:  Impaired flexibility, Decreased scar mobility, Decreased  strength, Decreased range of motion, Impaired UE functional use  Visit Diagnosis: Stiffness of left wrist, not elsewhere classified  Stiffness of left hand, not elsewhere classified  Pain in left wrist  Muscle weakness (generalized)    Problem List Patient Active Problem List   Diagnosis Date Noted  . Tibial plateau fracture 07/31/2017  . Chronic systolic heart failure ([place]) 10/08/2015  . Chronic obstructive pulmonary disease ([place]) 07/04/2015  . Controlled gout 06/06/2015  . Anxiety 02/09/2015  . Synovial cyst of popliteal space 02/09/2015  . Benign hypertension 02/09/2015  . Blood type A+ 02/09/2015  . Arterial branch occlusion of retina 02/09/2015  . Chronic constipation 02/09/2015  . Insomnia, persistent 02/09/2015  . Chronic combined systolic and diastolic CHF, NYHA class 1 ([place]) 02/09/2015  . Compromised kidney function 02/09/2015  . Dyslipidemia 02/09/2015  . Atrial fibrillation ([place]) 02/09/2015  . Arthritis urica 02/09/2015  . Hearing loss 02/09/2015  . History of open heart surgery 02/09/2015  . Chronic hoarseness 02/09/2015  . Cardiomegaly 02/09/2015  . Dysmetabolic syndrome 46/27/0350  . Migraine with aura and without status migrainosus, not intractable 02/09/2015  . Adult BMI 30+ 02/09/2015  . Hypertensive pulmonary vascular disease ([place]) 02/09/2015  . Allergic rhinitis, seasonal 02/09/2015  . Vitamin D deficiency 02/09/2015  . History of mitral valve repair 10/01/2012  . History of aortic valve replacement 10/01/2012  . Congestive heart failure ([place]) 10/01/2012    Rosalyn Gess OTR/L,CLT 01/25/2018, 2:59 PM  [facility] [address] [place], [address] Phone: [phone]   Fax:  [phone]  Name: [name] MRN: [id number] Date of Birth: December 22, 1940

## 2018-01-25 NOTE — Patient Instructions (Signed)
Wrist extention stretch for wall and table slides Prayer stretch  5 x day for 30 sec x5  And then cont with flexion at wrist stretch  Upgrade putty to teal - cont gripping, lat and 3 point grip

## 2018-01-26 ENCOUNTER — Ambulatory Visit: Payer: Medicare Other | Admitting: Physical Therapy

## 2018-01-26 ENCOUNTER — Encounter: Payer: Medicare Other | Admitting: Occupational Therapy

## 2018-01-26 DIAGNOSIS — H25012 Cortical age-related cataract, left eye: Secondary | ICD-10-CM | POA: Diagnosis not present

## 2018-01-28 ENCOUNTER — Ambulatory Visit: Payer: Medicare Other | Admitting: Occupational Therapy

## 2018-01-28 ENCOUNTER — Ambulatory Visit: Payer: Medicare Other | Admitting: Physical Therapy

## 2018-01-28 ENCOUNTER — Ambulatory Visit: Payer: Medicare Other | Admitting: Family Medicine

## 2018-02-02 ENCOUNTER — Ambulatory Visit: Payer: Medicare Other | Admitting: Occupational Therapy

## 2018-02-02 ENCOUNTER — Ambulatory Visit: Payer: Medicare Other | Admitting: Physical Therapy

## 2018-02-02 ENCOUNTER — Encounter: Payer: Self-pay | Admitting: Physical Therapy

## 2018-02-02 DIAGNOSIS — R262 Difficulty in walking, not elsewhere classified: Secondary | ICD-10-CM

## 2018-02-02 DIAGNOSIS — M25552 Pain in left hip: Secondary | ICD-10-CM

## 2018-02-02 DIAGNOSIS — M25532 Pain in left wrist: Secondary | ICD-10-CM | POA: Diagnosis not present

## 2018-02-02 DIAGNOSIS — M6281 Muscle weakness (generalized): Secondary | ICD-10-CM

## 2018-02-02 NOTE — Therapy (Signed)
[facility] [address], [place], [address] Phone: [phone]   Fax:  [phone]  Physical Therapy Treatment  Patient Details  Name: [name] MRN: [id number] Date of Birth: December 09, 1940 Referring Provider: Regis Bill   Encounter Date: 02/02/2018  PT End of Session - 02/02/18 1403    Visit Number  8    Number of Visits  17    Date for PT Re-Evaluation  02/10/18    PT Start Time  0263    PT Stop Time  1443    PT Time Calculation (min)  40 min    Activity Tolerance  Patient tolerated treatment well    Behavior During Therapy  [facility] for tasks assessed/performed       Past Medical History:  Diagnosis Date  . Atrial fibrillation ([place])   . Breast cancer ([place]) 1992   positive, radiation  . CHF (congestive heart failure) ([place])   . COPD (chronic obstructive pulmonary disease) ([place])   . History of aortic valve replacement   . Hyperlipidemia     Past Surgical History:  Procedure Laterality Date  . ABDOMINAL HYSTERECTOMY  1996  . BREAST BIOPSY Right 1998   neg  . BREAST BIOPSY Right 2007   neg  . BREAST BIOPSY Right 1992   positive  . BREAST EXCISIONAL BIOPSY Left 2000   neg  . CARDIAC VALVE REPLACEMENT  2014    There were no vitals filed for this visit.  Subjective Assessment - 02/02/18 1405    Subjective  Pt reports she has been standing at work since 9:15am with only one 30 minute seated rest break.  Pt says this is normal for her since going back to work.  Pt thinks her pain is due to the cement floor.  Pt reports she hasn't been taking any pain pills, has not been taking tylenol.      Pertinent History  Patient is a 77 y/o female s/p L ORIF tibial plateau fx following a fall down 5 steps Nov 2018, surgery Aug 27, 2017. Patient suffered another fall while in [place] preparing for tibial surgery, and fractured the distal L radius.  Patient had ORIF of L radius in Dec 26th. Patient  reports she has had therapy in the SNF and at home over the past 4 months. Patient reports she was ambulating with walker with LUE support, and then began ambulating with single crutch at the end of Jan 2019, and has been walking without AD for the past 2 weeks. Patient reports she finished home PT last Friday and reports she has been "exercising her legs and walking" with home health PT. Patient reports worst pain in the past week: 1/10 which only comes on when she is getting into bed best: 0/10    Limitations  Lifting;Standing;Walking;House hold activities    How long can you sit comfortably?  unlimited    How long can you stand comfortably?  20min    How long can you walk comfortably?  30 min.    Patient Stated Goals  Wants to be able to go back to work and walk longer without pain    Currently in Pain?  Yes    Pain Score  6     Pain Location  Knee    Pain Orientation  Left    Pain Descriptors / Indicators  Aching    Pain Type  Chronic pain    Pain Onset  More than a  month ago        TREATMENT   Supine bridges with 10 second holds to focus on gluteal endurance 2x10   Bil hip abd/ER isometrics with 10 second holds with belt around knees for strengthening of glutes, painfree. X10   Matrix hip abd 40# 2x10 each LE with cuing for eccentric control and proper posture.   Lateral step downs 2x 10 with cuing for eccentric control   Forward step ups 2x10 with cuing to prevent L hip drop   Supine bicycle with RTB around feet for resistance to promote strengthening of glutes. Pt fatigues quickly due to weakness and poor endurance in her core musculature. x5, x6, x10, x10                         PT Education - 02/02/18 1403    Education provided  Yes    Education Details  Exercise technique    Person(s) Educated  Patient    Methods  Explanation;Demonstration;Verbal cues    Comprehension  Verbalized understanding;Returned demonstration;Verbal cues required;Need further  instruction       PT Short Term Goals - 12/16/17 1551      PT SHORT TERM GOAL #1   Title  Pt will be independent with HEP in order to improve strength and balance in order to decrease fall risk and improve function at home and work.    Time  2    Period  Weeks        PT Long Term Goals - 12/16/17 1552      PT LONG TERM GOAL #1   Title  Pt will increase LEFS by at least 9 points in order to demonstrate significant improvement in lower extremity function.[facility]    Baseline  4/10 42%    Time  8    Period  Weeks    Status  New      PT LONG TERM GOAL #2   Title  Patient will increase FOTO score to 55 to demonstrate predicted increase in functional mobility to complete ADLs    Baseline  4/10 42    Time  8    Period  Weeks    Status  New      PT LONG TERM GOAL #3   Title  Pt will decrease 5TSTS by at least 3 seconds in order to demonstrate clinically significant improvement in LE strength    Baseline  4/10 15sec    Time  8    Period  Weeks    Status  New      PT LONG TERM GOAL #4   Title   Pt will increase 6MWT by at least 29m (155ft) in order to demonstrate clinically significant improvement in cardiopulmonary endurance and community ambulation    Baseline  To be done next visit    Time  8    Period  Weeks    Status  New            Plan - 02/02/18 1408    Clinical Impression Statement  Started therapy session with strengthening exercises in supine/hooklying in an effort to allow pt to perform strengthening not in a standing position as pt reports pain from being on her feet all day.  Pt was then able to transition to strengthening exercises in standing with seated rest breaks in between each exercise.  Encouraged pt to ask employer for elevating chair so that pt can sit some while at work.  Although  pt reports she is only working in [employer] for 2 more weeks until school is over.  Pt demonstrated poor core strength and endurance which places more stress on her LEs  throughout the day. Pt will benefit from continued skilled PT interventions for decreased pain and improved functional use of LLE.      Clinical Impairments Affecting Rehab Potential  (+) acute condition, motivated  (-) chronic condition    PT Frequency  2x / week    PT Duration  8 weeks    PT Treatment/Interventions  Iontophoresis 4mg /ml Dexamethasone;Patient/family education;Electrical Stimulation;Cryotherapy;Neuromuscular re-education;Manual techniques;Therapeutic exercise;Ultrasound;Moist Heat;Gait training;Passive range of motion;Therapeutic activities;Balance training    PT Next Visit Plan  Continue L LE strengthening as able; gait training    PT Home Exercise Plan  bridge exercise, SLR, scoot stretch, standing quad stretch    Consulted and Agree with Plan of Care  Patient       Patient will benefit from skilled therapeutic intervention in order to improve the following deficits and impairments:  Abnormal gait, Increased fascial restricitons, Pain, Improper body mechanics, Postural dysfunction, Decreased endurance, Decreased range of motion, Decreased activity tolerance, Decreased strength, Difficulty walking, Impaired flexibility  Visit Diagnosis: Muscle weakness (generalized)  Difficulty in walking, not elsewhere classified  Pain in left hip     Problem List Patient Active Problem List   Diagnosis Date Noted  . Tibial plateau fracture 07/31/2017  . Chronic systolic heart failure ([place]) 10/08/2015  . Chronic obstructive pulmonary disease ([place]) 07/04/2015  . Controlled gout 06/06/2015  . Anxiety 02/09/2015  . Synovial cyst of popliteal space 02/09/2015  . Benign hypertension 02/09/2015  . Blood type A+ 02/09/2015  . Arterial branch occlusion of retina 02/09/2015  . Chronic constipation 02/09/2015  . Insomnia, persistent 02/09/2015  . Chronic combined systolic and diastolic CHF, NYHA class 1 ([place]) 02/09/2015  . Compromised kidney function 02/09/2015  . Dyslipidemia  02/09/2015  . Atrial fibrillation ([place]) 02/09/2015  . Arthritis urica 02/09/2015  . Hearing loss 02/09/2015  . History of open heart surgery 02/09/2015  . Chronic hoarseness 02/09/2015  . Cardiomegaly 02/09/2015  . Dysmetabolic syndrome 41/63/8453  . Migraine with aura and without status migrainosus, not intractable 02/09/2015  . Adult BMI 30+ 02/09/2015  . Hypertensive pulmonary vascular disease ([place]) 02/09/2015  . Allergic rhinitis, seasonal 02/09/2015  . Vitamin D deficiency 02/09/2015  . History of mitral valve repair 10/01/2012  . History of aortic valve replacement 10/01/2012  . Congestive heart failure ([place]) 10/01/2012    Collie Siad PT, DPT 02/02/2018, 2:44 PM  [facility] [address] [place], [address] Phone: [phone]   Fax:  [phone]  Name: [name] MRN: [id number] Date of Birth: [birth year]

## 2018-02-04 ENCOUNTER — Ambulatory Visit: Payer: Medicare Other | Admitting: Occupational Therapy

## 2018-02-04 ENCOUNTER — Ambulatory Visit: Payer: Medicare Other | Admitting: Physical Therapy

## 2018-02-04 DIAGNOSIS — M25532 Pain in left wrist: Secondary | ICD-10-CM

## 2018-02-04 DIAGNOSIS — M6281 Muscle weakness (generalized): Secondary | ICD-10-CM

## 2018-02-04 DIAGNOSIS — M25642 Stiffness of left hand, not elsewhere classified: Secondary | ICD-10-CM

## 2018-02-04 DIAGNOSIS — M25632 Stiffness of left wrist, not elsewhere classified: Secondary | ICD-10-CM

## 2018-02-04 NOTE — Therapy (Signed)
[facility] [address] [place], [address] Phone: [phone]   Fax:  [phone]  Occupational Therapy Treatment  Patient Details  Name: [name] MRN: [id number] Date of Birth: 06/17/1941 Referring Provider: Regis Bill   Encounter Date: 02/04/2018  OT End of Session - 02/04/18 1528    Visit Number  6    Number of Visits  12    Date for OT Re-Evaluation  02/09/18    OT Start Time  1450    OT Stop Time  1514    OT Time Calculation (min)  24 min    Activity Tolerance  Patient tolerated treatment well    Behavior During Therapy  [facility] for tasks assessed/performed       Past Medical History:  Diagnosis Date  . Atrial fibrillation ([place])   . Breast cancer ([place]) 1992   positive, radiation  . CHF (congestive heart failure) ([place])   . COPD (chronic obstructive pulmonary disease) ([place])   . History of aortic valve replacement   . Hyperlipidemia     Past Surgical History:  Procedure Laterality Date  . ABDOMINAL HYSTERECTOMY  1996  . BREAST BIOPSY Right 1998   neg  . BREAST BIOPSY Right 2007   neg  . BREAST BIOPSY Right 1992   positive  . BREAST EXCISIONAL BIOPSY Left 2000   neg  . CARDIAC VALVE REPLACEMENT  2014    There were no vitals filed for this visit.  Subjective Assessment - 02/04/18 1526    Subjective   I am using my hand in everyting at work - lifting, pulling , pushing - no pain anymore - still some stiffness - but you and Dr told me it will take 6 months to year - I did the exercises that you told me to dao     Patient Stated Goals  I need to be able to use my and to pick up , grip to cook , cater and play piano - i need to go back to work , I work in [employer]     Currently in Pain?  No/denies         [facility] OT Assessment - 02/04/18 0001      AROM   Left Wrist Extension  53 Degrees    Left Wrist Flexion  62 Degrees      Strength   Right Hand Grip (lbs)  75    Right Hand  Lateral Pinch  14 lbs    Right Hand 3 Point Pinch  14 lbs    Left Hand Grip (lbs)  30    Left Hand Lateral Pinch  12.5 lbs    Left Hand 3 Point Pinch  8.5 lbs        Assess wrist AROM and grip /prehensiion strength in L wrist and hand -compare and review with pt wrist extention different PROM and stretches she can do , often during day  Cont putty to tolerance for grip strength  And use functionally  Cont scar massage                  OT Education - 02/04/18 1528    Education provided  Yes    Education Details  Homeprogram to focus on PROM wrist extention and grip strength     Person(s) Educated  Patient    Methods  Explanation;Demonstration    Comprehension  Verbalized understanding;Returned demonstration       OT Short  Term Goals - 02/04/18 1530      OT SHORT TERM GOAL #1   Title  Pt to be independent in HEP to increase AROM in digits , wrist to use L hand in more than 50% of bathing , dressing , and cooking     Status  Achieved      OT SHORT TERM GOAL #2   Title  L digits AROM improve for pt to make tight fist and open digits to reach into pocket / play keyboard     Status  Achieved      OT SHORT TERM GOAL #3   Title  L wrist AROM improve with more than 10-15 degrees to turn doorknob, bath, fasten bra, stir and cut in cooking     Status  Achieved        OT Long Term Goals - 02/04/18 1530      OT LONG TERM GOAL #1   Title  Grip strength and prehension improve to more than 50% compare to R hand to return to work    Baseline  improve but grip on R 75 and L 30 lbs     Status  On-going    Target Date  03/18/18      OT LONG TERM GOAL #2   Title  Function score on PRWHE improve with more than 20 points     Baseline  at eval function score on PRWHE 41/50  - progressing very well - will reassess next time     Time  6    Period  Weeks    Status  On-going    Target Date  03/18/18      OT LONG TERM GOAL #3   Title  Strength in L wrist improve to 4+/5 to  carry  and lift more than 5 lbs to perform her job , push door open     Status  Achieved            Plan - 02/04/18 1529    Clinical Impression Statement  Pt cont to make progress in pain , AROM , strength and functional use at work and home - still limited mostly in wrist extention ROM , and grip /prehension strength - pt to cont with homeprogram and use for 6 wks and return for reassessment     Occupational performance deficits (Please refer to evaluation for details):  ADL's;IADL's;Work;Play;Leisure    Rehab Potential  Good    Current Impairments/barriers affecting progress:  Pt was in cast or splint since 09/02/17 -     OT Frequency  -- in 6 wks     OT Duration  6 weeks    OT Treatment/Interventions  Self-care/ADL training;Patient/family education;Paraffin;Fluidtherapy;Scar mobilization;Manual Therapy;Therapeutic exercise;Passive range of motion    Plan  reassess pt in 6 wks     Clinical Decision Making  Limited treatment options, no task modification necessary    OT Home Exercise Plan  see pt instruction    Consulted and Agree with Plan of Care  Patient       Patient will benefit from skilled therapeutic intervention in order to improve the following deficits and impairments:  Impaired flexibility, Decreased scar mobility, Decreased strength, Decreased range of motion, Impaired UE functional use  Visit Diagnosis: Muscle weakness (generalized)  Stiffness of left wrist, not elsewhere classified  Stiffness of left hand, not elsewhere classified  Pain in left wrist    Problem List Patient Active Problem List   Diagnosis Date Noted  .  Tibial plateau fracture 07/31/2017  . Chronic systolic heart failure ([place]) 10/08/2015  . Chronic obstructive pulmonary disease ([place]) 07/04/2015  . Controlled gout 06/06/2015  . Anxiety 02/09/2015  . Synovial cyst of popliteal space 02/09/2015  . Benign hypertension 02/09/2015  . Blood type A+ 02/09/2015  . Arterial branch occlusion of  retina 02/09/2015  . Chronic constipation 02/09/2015  . Insomnia, persistent 02/09/2015  . Chronic combined systolic and diastolic CHF, NYHA class 1 ([place]) 02/09/2015  . Compromised kidney function 02/09/2015  . Dyslipidemia 02/09/2015  . Atrial fibrillation ([place]) 02/09/2015  . Arthritis urica 02/09/2015  . Hearing loss 02/09/2015  . History of open heart surgery 02/09/2015  . Chronic hoarseness 02/09/2015  . Cardiomegaly 02/09/2015  . Dysmetabolic syndrome 72/62/0355  . Migraine with aura and without status migrainosus, not intractable 02/09/2015  . Adult BMI 30+ 02/09/2015  . Hypertensive pulmonary vascular disease ([place]) 02/09/2015  . Allergic rhinitis, seasonal 02/09/2015  . Vitamin D deficiency 02/09/2015  . History of mitral valve repair 10/01/2012  . History of aortic valve replacement 10/01/2012  . Congestive heart failure ([place]) 10/01/2012    Rosalyn Gess OTR/L,CLT 02/04/2018, 3:32 PM  [facility] [address], [place], [address] Phone: [phone]   Fax:  [phone]  Name: [name] MRN: [id number] Date of Birth: 12-Dec-1940

## 2018-02-04 NOTE — Patient Instructions (Signed)
Cont with putty HEP  And wrist extention stretches or PROM

## 2018-02-09 ENCOUNTER — Ambulatory Visit: Payer: BC Managed Care – PPO | Admitting: Physical Therapy

## 2018-02-10 ENCOUNTER — Encounter: Payer: Self-pay | Admitting: Nurse Practitioner

## 2018-02-10 ENCOUNTER — Ambulatory Visit (INDEPENDENT_AMBULATORY_CARE_PROVIDER_SITE_OTHER): Payer: Medicare Other | Admitting: Nurse Practitioner

## 2018-02-10 VITALS — BP 120/70 | HR 87 | Resp 16 | Ht 64.0 in | Wt 178.7 lb

## 2018-02-10 DIAGNOSIS — Z1211 Encounter for screening for malignant neoplasm of colon: Secondary | ICD-10-CM | POA: Diagnosis not present

## 2018-02-10 DIAGNOSIS — L299 Pruritus, unspecified: Secondary | ICD-10-CM | POA: Diagnosis not present

## 2018-02-10 MED ORDER — HYDROXYZINE HCL 10 MG PO TABS: 10.0000 mg | ORAL_TABLET | Freq: Two times a day (BID) | ORAL | 0 refills | Status: DC | PRN

## 2018-02-10 NOTE — Progress Notes (Addendum)
Name: [name]   MRN: [id number]    DOB: [birth year]   Date:02/10/2018       Progress Note  Subjective  Chief Complaint  Chief Complaint  Patient presents with  . Rash    On her abdominal area and bilateral arms-itches worst at night, bumps and redness.     HPI  Patient endorses itching on bilateral arms legs and abdomen patient states has been going on intermittently for a month. States got new cleaning supplies and started itching states stopped using it and is still have itching. Patient notes areas of redness after scratching. States she has changed beds and put new bed covers on after hospitalization last month and has been itching since- bed is over 7 years old. Has tried triamcinolone acetonide cream with relief when using.   Patient Active Problem List   Diagnosis Date Noted  . Tibial plateau fracture 07/31/2017  . Chronic systolic heart failure ([place]) 10/08/2015  . Chronic obstructive pulmonary disease ([place]) 07/04/2015  . Controlled gout 06/06/2015  . Anxiety 02/09/2015  . Synovial cyst of popliteal space 02/09/2015  . Benign hypertension 02/09/2015  . Blood type A+ 02/09/2015  . Arterial branch occlusion of retina 02/09/2015  . Chronic constipation 02/09/2015  . Insomnia, persistent 02/09/2015  . Chronic combined systolic and diastolic CHF, NYHA class 1 ([place]) 02/09/2015  . Compromised kidney function 02/09/2015  . Dyslipidemia 02/09/2015  . Atrial fibrillation ([place]) 02/09/2015  . Arthritis urica 02/09/2015  . Hearing loss 02/09/2015  . History of open heart surgery 02/09/2015  . Chronic hoarseness 02/09/2015  . Cardiomegaly 02/09/2015  . Dysmetabolic syndrome 11/91/4782  . Migraine with aura and without status migrainosus, not intractable 02/09/2015  . Adult BMI 30+ 02/09/2015  . Hypertensive pulmonary vascular disease ([place]) 02/09/2015  . Allergic rhinitis, seasonal 02/09/2015  . Vitamin D deficiency 02/09/2015  . History of mitral valve repair 10/01/2012  .  History of aortic valve replacement 10/01/2012  . Congestive heart failure ([place]) 10/01/2012    Past Medical History:  Diagnosis Date  . Atrial fibrillation ([place])   . Breast cancer ([place]) 1992   positive, radiation  . CHF (congestive heart failure) ([place])   . COPD (chronic obstructive pulmonary disease) ([place])   . History of aortic valve replacement   . Hyperlipidemia     Past Surgical History:  Procedure Laterality Date  . ABDOMINAL HYSTERECTOMY  1996  . BREAST BIOPSY Right 1998   neg  . BREAST BIOPSY Right 2007   neg  . BREAST BIOPSY Right 1992   positive  . BREAST EXCISIONAL BIOPSY Left 2000   neg  . CARDIAC VALVE REPLACEMENT  2014    Social History   Tobacco Use  . Smoking status: Former Smoker    Last attempt to quit: 09/09/2007    Years since quitting: 10.4  . Smokeless tobacco: Never Used  Substance Use Topics  . Alcohol use: No    Alcohol/week: 0.0 oz     Current Outpatient Medications:  .  acetaminophen (TYLENOL) 500 MG tablet, Take 1,000-1,500 mg by mouth every 6 (six) hours as needed for mild pain., Disp: , Rfl:  .  allopurinol (ZYLOPRIM) 300 MG tablet, TAKE 1 TABLET BY MOUTH DAILY, Disp: 90 tablet, Rfl: 1 .  aspirin EC 81 MG tablet, Take 1 tablet by mouth daily., Disp: , Rfl:  .  atorvastatin (LIPITOR) 80 MG tablet, Take 1 tablet by mouth at bedtime., Disp: , Rfl:  .  Calcium Carbonate-Vitamin D 600-400 MG-UNIT tablet,  Take 1 tablet by mouth 2 (two) times daily., Disp: , Rfl:  .  colchicine 0.6 MG tablet, Take 2 tablets by mouth as needed. Take two tablets (1.2mg ) by mouth at first sign of gout flare followed by 1 tablet (0.6mg ) after 1 hour., Disp: , Rfl:  .  digoxin (LANOXIN) 0.125 MG tablet, TAKE 1 TABLET (0.125 MG TOTAL) BY MOUTH ONCE DAILY., Disp: , Rfl: 6 .  enoxaparin (LOVENOX) 80 MG/0.8ML injection, Inject 80 mg into the skin 2 (two) times daily., Disp: , Rfl:  .  ezetimibe (ZETIA) 10 MG tablet, Take 1 tablet by mouth daily., Disp: , Rfl:  .  KLOR-CON  M20 20 MEQ tablet, TAKE 1 TABLET BY MOUTH ONCE A DAY, Disp: 90 tablet, Rfl: 1 .  losartan (COZAAR) 25 MG tablet, Take 0.5 tablets by mouth daily., Disp: , Rfl:  .  metoprolol tartrate (LOPRESSOR) 25 MG tablet, TAKE 1 TABLET BY MOUTH TWICE A DAY, Disp: 180 tablet, Rfl: 0 .  ondansetron (ZOFRAN-ODT) 4 MG disintegrating tablet, Take 4 mg by mouth every 8 (eight) hours as needed for nausea or vomiting., Disp: , Rfl:  .  oxycodone (OXY-IR) 5 MG capsule, Take 10 mg by mouth every 4 (four) hours as needed., Disp: , Rfl:  .  polyethylene glycol (MIRALAX / GLYCOLAX) packet, Take 17 g by mouth daily., Disp: , Rfl:  .  senna-docusate (SENOKOT-S) 8.6-50 MG tablet, Take 2 tablets by mouth 2 (two) times daily., Disp: , Rfl:  .  SPIRIVA HANDIHALER 18 MCG inhalation capsule, INHALE 1 CASPULE VIA HANDIHALER ONCE A DAY AT THE SAME TIME EVERY DAY, Disp: 90 capsule, Rfl: 0 .  temazepam (RESTORIL) 15 MG capsule, Take 1 capsule by mouth at bedtime., Disp: , Rfl:  .  torsemide (DEMADEX) 20 MG tablet, Take 1 tablet (20 mg total) by mouth daily., Disp: 90 tablet, Rfl: 1 .  traZODone (DESYREL) 50 MG tablet, TAKE 1 TABLET BY MOUTH EVERYDAY AT BEDTIME, Disp: 30 tablet, Rfl: 0 .  Vitamin D, Ergocalciferol, (DRISDOL) 50000 units CAPS capsule, Take 1 capsule by mouth once a week., Disp: , Rfl:  .  clindamycin (CLEOCIN) 300 MG capsule, Take 2 capsules by mouth as needed. Take 2 capsules by mouth 1 hour prior to dental procedure., Disp: , Rfl: 6  Allergies  Allergen Reactions  . Ace Inhibitors Cough and Other (See Comments)  . Amoxicillin-Pot Clavulanate Nausea And Vomiting  . Codeine Nausea And Vomiting  . Penicillins Nausea And Vomiting and Other (See Comments)    Has patient had a PCN reaction causing immediate rash, facial/tongue/throat swelling, SOB or lightheadedness with hypotension: No Has patient had a PCN reaction causing severe rash involving mucus membranes or skin necrosis: No Has patient had a PCN reaction that  required hospitalization No Has patient had a PCN reaction occurring within the last 10 years: No If all of the above answers are "NO", then may proceed with Cephalosporin use.  . Rosuvastatin Other (See Comments)    Reaction:  Joint pain   . Tetanus-Diphtheria Toxoids Td Swelling    ROS  No other specific complaints in a complete review of systems (except as listed in HPI above).  Objective  Vitals:   02/10/18 1410  BP: 120/70  Pulse: 87  Resp: 16  SpO2: 97%  Weight: 178 lb 11.2 oz (81.1 kg)  Height: 5\' 4"  (1.626 m)     Body mass index is 30.67 kg/m.  Nursing Note and Vital Signs reviewed.  Physical Exam  Skin:  Constitutional: Patient appears well-developed and well-nourished. Obese  No distress.  Cardiovascular: Normal rate, regular rhythm, S1/S2 present.   Pulmonary/Chest: Effort normal and breath sounds clear. No respiratory distress or retractions. Psychiatric: Patient has a normal mood and affect. behavior is normal. Judgment and thought content normal.  No results found for this or any previous visit (from the past 72 hour(s)).  Assessment & Plan  1. Pruritus - sleep on a new bed for the next 3 days to see if that resolves your symptoms -Stop using that new cleaning product for a week and see if that resolves symptoms - Check the bed for possible bed bugs - Think through anything new fragrances, soaps, detergents that you have bought and switch back to your old ones.  - Use cream for itching first - If still having itching use the pill- but only if absolutely necessary. Very sparingly only at night - hydrOXYzine (ATARAX/VISTARIL) 10 MG tablet; Take 1 tablet (10 mg total) by mouth 2 (two) times daily as needed.  Dispense: 30 tablet; Refill: 0 - follow up in one week  2. Colon cancer screening  - Cologuard  -Red flags and when to present for emergency care or RTC including fever >101.11F, chest pain, shortness of breath, new/worsening/un-resolving  symptoms, reviewed with patient at time of visit. Follow up and care instructions discussed and provided in AVS. -Reviewed Health Maintenance: cologuard   -------------------------------------------- I have reviewed this encounter including the documentation in this note and/or discussed this patient with the provider, Suezanne Cheshire DNP AGNP-C. I am certifying that I agree with the content of this note as supervising physician. Enid Derry, [facility] 02/19/2018, 5:59 PM

## 2018-02-10 NOTE — Patient Instructions (Addendum)
-   sleep on a new bed for the next 3 days to see if that resolves your symptoms -Stop using that new cleaning product for a week and see if that resolves symptoms - Check the bed for possible bed bugs - Think through anything new fragrances, soaps, detergents that you have bought and switch back to your old ones.  - Use cream for itching first - If still having itching use the pill- but only if absolutely necessary   Pruritus Pruritus is an itching feeling. There are many different conditions and factors that can make your skin itchy. Dry skin is one of the most common causes of itching. Most cases of itching do not require medical attention. Itchy skin can turn into a rash. Follow these instructions at home: Watch your pruritus for any changes. Take these steps to help with your condition: Skin Care  Moisturize your skin as needed. A moisturizer that contains petroleum jelly is best for keeping moisture in your skin.  Take or apply medicines only as directed by your health care provider. This may include: ? Corticosteroid cream. ? Anti-itch lotions. ? Oral anti-histamines.  Apply cool compresses to the affected areas.  Try taking a bath with: ? Epsom salts. Follow the instructions on the packaging. You can get these at your local pharmacy or grocery store. ? Baking soda. Pour a small amount into the bath as directed by your health care provider. ? Colloidal oatmeal. Follow the instructions on the packaging. You can get this at your local pharmacy or grocery store.  Try applying baking soda paste to your skin. Stir water into baking soda until it reaches a paste-like consistency.  Do not scratch your skin.  Avoid hot showers or baths, which can make itching worse. A cold shower may help with itching as long as you use a moisturizer after.  Avoid scented soaps, detergents, and perfumes. Use gentle soaps, detergents, perfumes, and other cosmetic products. General instructions  Avoid  wearing tight clothes.  Keep a journal to help track what causes your itch. Write down: ? What you eat. ? What cosmetic products you use. ? What you drink. ? What you wear. This includes jewelry.  Use a humidifier. This keeps the air moist, which helps to prevent dry skin. Contact a health care provider if:  The itching does not go away after several days.  You sweat at night.  You have weight loss.  You are unusually thirsty.  You urinate more than normal.  You are more tired than normal.  You have abdominal pain.  Your skin tingles.  You feel weak.  Your skin or the whites of your eyes look yellow (jaundice).  Your skin feels numb. This information is not intended to replace advice given to you by your health care provider. Make sure you discuss any questions you have with your health care provider. Document Released: 05/07/2011 Document Revised: 01/31/2016 Document Reviewed: 08/21/2014 Elsevier Interactive Patient Education  [employer].

## 2018-02-11 ENCOUNTER — Encounter: Payer: Medicare Other | Admitting: Occupational Therapy

## 2018-02-11 ENCOUNTER — Ambulatory Visit: Payer: BC Managed Care – PPO | Admitting: Physical Therapy

## 2018-02-17 ENCOUNTER — Ambulatory Visit: Payer: BC Managed Care – PPO | Attending: Orthopedic Surgery | Admitting: Physical Therapy

## 2018-02-18 ENCOUNTER — Encounter: Payer: Self-pay | Admitting: Family Medicine

## 2018-02-18 ENCOUNTER — Ambulatory Visit (INDEPENDENT_AMBULATORY_CARE_PROVIDER_SITE_OTHER): Payer: Medicare Other | Admitting: Family Medicine

## 2018-02-18 VITALS — BP 132/84 | HR 94 | Temp 98.6°F | Resp 14 | Ht 64.0 in | Wt 179.2 lb

## 2018-02-18 DIAGNOSIS — E785 Hyperlipidemia, unspecified: Secondary | ICD-10-CM

## 2018-02-18 DIAGNOSIS — I5042 Chronic combined systolic (congestive) and diastolic (congestive) heart failure: Secondary | ICD-10-CM | POA: Diagnosis not present

## 2018-02-18 DIAGNOSIS — E8881 Metabolic syndrome: Secondary | ICD-10-CM | POA: Diagnosis not present

## 2018-02-18 DIAGNOSIS — J410 Simple chronic bronchitis: Secondary | ICD-10-CM

## 2018-02-18 DIAGNOSIS — I10 Essential (primary) hypertension: Secondary | ICD-10-CM | POA: Diagnosis not present

## 2018-02-18 DIAGNOSIS — F339 Major depressive disorder, recurrent, unspecified: Secondary | ICD-10-CM | POA: Diagnosis not present

## 2018-02-18 DIAGNOSIS — E559 Vitamin D deficiency, unspecified: Secondary | ICD-10-CM | POA: Diagnosis not present

## 2018-02-18 DIAGNOSIS — J440 Chronic obstructive pulmonary disease with acute lower respiratory infection: Secondary | ICD-10-CM | POA: Diagnosis not present

## 2018-02-18 DIAGNOSIS — M109 Gout, unspecified: Secondary | ICD-10-CM

## 2018-02-18 DIAGNOSIS — R739 Hyperglycemia, unspecified: Secondary | ICD-10-CM | POA: Diagnosis not present

## 2018-02-18 DIAGNOSIS — G43109 Migraine with aura, not intractable, without status migrainosus: Secondary | ICD-10-CM

## 2018-02-18 DIAGNOSIS — E2839 Other primary ovarian failure: Secondary | ICD-10-CM

## 2018-02-18 DIAGNOSIS — I480 Paroxysmal atrial fibrillation: Secondary | ICD-10-CM | POA: Diagnosis not present

## 2018-02-18 MED ORDER — TORSEMIDE 20 MG PO TABS: 20.0000 mg | ORAL_TABLET | Freq: Every day | ORAL | 1 refills | Status: DC

## 2018-02-18 MED ORDER — PREDNISONE 10 MG (21) PO TBPK
ORAL_TABLET | ORAL | 0 refills | Status: DC
Start: 2018-02-18 — End: 2018-03-26

## 2018-02-18 MED ORDER — ALLOPURINOL 300 MG PO TABS: 300.0000 mg | ORAL_TABLET | Freq: Every day | ORAL | 1 refills | Status: DC

## 2018-02-18 MED ORDER — TIOTROPIUM BROMIDE MONOHYDRATE 18 MCG IN CAPS: ORAL_CAPSULE | RESPIRATORY_TRACT | 1 refills | Status: DC

## 2018-02-18 NOTE — Progress Notes (Signed)
Name: [name]   MRN: [id number]    DOB: 1941/02/04   Date:02/18/2018       Progress Note  Subjective  Chief Complaint  Chief Complaint  Patient presents with  . Follow-up  . Burn    rt index finger finger  . Gout    right foot    HPI  Chronic combined CHF: she is on Demadex and potassium, off digoxin ( she stopped taking it)  and beta-blocker . No chest , she has  SOB a times , she denies orthopnea. She is not on ACE because of cough, she was on Losartan for a period of time but bp dropped so cardiologist, Dr. Clayborn Bigness decreased dose of losartan to half pill daily  HTN: taking metoprolol, bp much better controlled since aortic valve replacement. No chest pain , she has mild  SOB with activity.   Hyperlipidemia:she had stopped taking Atorvastatin because of myalgia, but since hospital stay for surgery she has been back on medication, not sure if taking Zetia, we will check labs.   Insomnia: she states she is back on Trazodone  Gout: she is having symptoms of gout again,she is taking allopurinol, having about two flares per year, the last episode started one week ago, causing her to limp, does not like taking colchicine because it causes nausea, but responded to prednisone in the past.   Dysmetabolic Syndrome: she is not very compliant with diet. She denies polyphagia, polyuria or polydipsia   Afib: rate controlled, no fluttering sensation, taking betablocker and off digoxin, also on coumadin and goes to coumadin clinic/Dr. Callwood.Unchanged.   Chronic bronchitis : she stopped taking Spiriva because she is feeling well, but has noticed that when she takes medication SOB is at goal, she would like to resume medication . She states very seldom has a productive cough in the mornings , no wheezing.  Depression: she states was very down while at rehab facility, she states she much better now, denies suicidal thoughts or ideation. She has been eating more than usual and  feels bad because of weight gain   Migraine headaches: she has occasional episodes of aura, described as scotomas and recently not followed by a headache. Usually triggered by not sleeping well or skipping a meal, but improves with rest or when she eats food. Unchanged   Patient Active Problem List   Diagnosis Date Noted  . Chronic anticoagulation 08/27/2017  . Closed fracture of distal ends of left radius and ulna 08/24/2017  . Tibial plateau fracture, left 08/01/2017  . Chronic obstructive pulmonary disease ([place]) 07/04/2015  . Controlled gout 06/06/2015  . Anxiety 02/09/2015  . Synovial cyst of popliteal space 02/09/2015  . Benign hypertension 02/09/2015  . Blood type A+ 02/09/2015  . Arterial branch occlusion of retina 02/09/2015  . Chronic constipation 02/09/2015  . Insomnia, persistent 02/09/2015  . Chronic combined systolic and diastolic CHF, NYHA class 1 ([place]) 02/09/2015  . Compromised kidney function 02/09/2015  . Dyslipidemia 02/09/2015  . Atrial fibrillation ([place]) 02/09/2015  . Arthritis urica 02/09/2015  . Hearing loss 02/09/2015  . History of open heart surgery 02/09/2015  . Chronic hoarseness 02/09/2015  . Cardiomegaly 02/09/2015  . Dysmetabolic syndrome 77/82/4235  . Migraine with aura and without status migrainosus, not intractable 02/09/2015  . Adult BMI 30+ 02/09/2015  . Hypertensive pulmonary vascular disease ([place]) 02/09/2015  . Allergic rhinitis, seasonal 02/09/2015  . Vitamin D deficiency 02/09/2015  . History of mitral valve repair 10/01/2012  . History of  aortic valve replacement 10/01/2012  . Congestive heart failure ([place]) 10/01/2012    Past Surgical History:  Procedure Laterality Date  . ABDOMINAL HYSTERECTOMY  1996  . BREAST BIOPSY Right 1998   neg  . BREAST BIOPSY Right 2007   neg  . BREAST BIOPSY Right 1992   positive  . BREAST EXCISIONAL BIOPSY Left 2000   neg  . CARDIAC VALVE REPLACEMENT  2014    Family History  Problem Relation Age  of Onset  . Cancer Mother   . Diabetes Mother   . Hypertension Mother   . Breast cancer Mother 51  . Hypertension Father   . Heart failure Father   . Cancer Brother        bladder  . Cancer Sister        breast  . Breast cancer Sister 35  . Hypertension Son   . Hypertension Daughter   . Breast cancer Sister 67    Social History   Socioeconomic History  . Marital status: Widowed    Spouse name: Not on file  . Number of children: 2  . Years of education: Not on file  . Highest education level: Not on file  Occupational History  . Occupation: Custodian  Social Needs  . Financial resource strain: Not on file  . Food insecurity:    Worry: Not on file    Inability: Not on file  . Transportation needs:    Medical: Not on file    Non-medical: Not on file  Tobacco Use  . Smoking status: Former Smoker    Last attempt to quit: 09/09/2007    Years since quitting: 10.4  . Smokeless tobacco: Never Used  Substance and Sexual Activity  . Alcohol use: No    Alcohol/week: 0.0 oz  . Drug use: No  . Sexual activity: Not Currently  Lifestyle  . Physical activity:    Days per week: Not on file    Minutes per session: Not on file  . Stress: Not on file  Relationships  . Social connections:    Talks on phone: Not on file    Gets together: Not on file    Attends religious service: Not on file    Active member of club or organization: Not on file    Attends meetings of clubs or organizations: Not on file    Relationship status: Not on file  . Intimate partner violence:    Fear of current or ex partner: Not on file    Emotionally abused: Not on file    Physically abused: Not on file    Forced sexual activity: Not on file  Other Topics Concern  . Not on file  Social History Narrative  . Not on file     Current Outpatient Medications:  .  acetaminophen (TYLENOL) 500 MG tablet, Take 1,000-1,500 mg by mouth every 6 (six) hours as needed for mild pain., Disp: , Rfl:  .   allopurinol (ZYLOPRIM) 300 MG tablet, Take 1 tablet (300 mg total) by mouth daily., Disp: 90 tablet, Rfl: 1 .  aspirin EC 81 MG tablet, Take 1 tablet by mouth daily., Disp: , Rfl:  .  atorvastatin (LIPITOR) 80 MG tablet, Take 1 tablet by mouth at bedtime., Disp: , Rfl:  .  Calcium Carbonate-Vitamin D 600-400 MG-UNIT tablet, Take 1 tablet by mouth 2 (two) times daily., Disp: , Rfl:  .  KLOR-CON M20 20 MEQ tablet, TAKE 1 TABLET BY MOUTH ONCE A DAY, Disp: 90 tablet, Rfl:  1 .  metoprolol tartrate (LOPRESSOR) 25 MG tablet, TAKE 1 TABLET BY MOUTH TWICE A DAY, Disp: 180 tablet, Rfl: 0 .  tiotropium (SPIRIVA HANDIHALER) 18 MCG inhalation capsule, INHALE 1 CASPULE VIA HANDIHALER ONCE A DAY AT THE SAME TIME EVERY DAY, Disp: 90 capsule, Rfl: 1 .  torsemide (DEMADEX) 20 MG tablet, Take 1 tablet (20 mg total) by mouth daily., Disp: 90 tablet, Rfl: 1 .  traZODone (DESYREL) 50 MG tablet, TAKE 1 TABLET BY MOUTH EVERYDAY AT BEDTIME, Disp: 30 tablet, Rfl: 0 .  ezetimibe (ZETIA) 10 MG tablet, Take 1 tablet by mouth daily., Disp: , Rfl:  .  losartan (COZAAR) 25 MG tablet, Take 0.5 tablets by mouth daily., Disp: , Rfl:  .  polyethylene glycol (MIRALAX / GLYCOLAX) packet, Take 17 g by mouth daily., Disp: , Rfl:  .  predniSONE (STERAPRED UNI-PAK 21 TAB) 10 MG (21) TBPK tablet, Take as directed, Disp: 21 tablet, Rfl: 0 .  senna-docusate (SENOKOT-S) 8.6-50 MG tablet, Take 2 tablets by mouth 2 (two) times daily., Disp: , Rfl:   Allergies  Allergen Reactions  . Ace Inhibitors Cough and Other (See Comments)  . Amoxicillin-Pot Clavulanate Nausea And Vomiting  . Codeine Nausea And Vomiting  . Penicillins Nausea And Vomiting and Other (See Comments)    Has patient had a PCN reaction causing immediate rash, facial/tongue/throat swelling, SOB or lightheadedness with hypotension: No Has patient had a PCN reaction causing severe rash involving mucus membranes or skin necrosis: No Has patient had a PCN reaction that required  hospitalization No Has patient had a PCN reaction occurring within the last 10 years: No If all of the above answers are "NO", then may proceed with Cephalosporin use.  . Rosuvastatin Other (See Comments)    Reaction:  Joint pain   . Tetanus-Diphtheria Toxoids Td Swelling     ROS  Constitutional: Negative for fever , positive for weight change.  Respiratory: Negative for cough but has intermittent  shortness of breath.   Cardiovascular: Negative for chest pain or palpitations.  Gastrointestinal: Negative for abdominal pain, no bowel changes.  Musculoskeletal: Positive  for mild gait problem and left ankle joint swelling.  Skin: Negative for rash.  Neurological: Negative for dizziness or headache.  No other specific complaints in a complete review of systems (except as listed in HPI above).  Objective  Vitals:   02/18/18 1357  BP: 132/84  Pulse: 94  Resp: 14  Temp: 98.6 F (37 C)  TempSrc: Oral  SpO2: 95%  Weight: 179 lb 3.2 oz (81.3 kg)  Height: 5\' 4"  (1.626 m)    Body mass index is 30.76 kg/m.  Physical Exam  Constitutional: Patient appears well-developed and well-nourished. Obese  No distress.  HEENT: head atraumatic, normocephalic, pupils equal and reactive to light,  neck supple, throat within normal limits Cardiovascular: Normal rate, irregular rhythm and 3/6 SEM No BLE edema. Pulmonary/Chest: Effort normal and breath sounds normal. No respiratory distress. Abdominal: Soft.  There is no tenderness. Psychiatric: Patient has a normal mood and affect. behavior is normal. Judgment and thought content normal. Muscular Skeletal: pain during palpation of left ankle, no erythema or increase in warmth, she has scarring from left wrist and also left knee   PHQ2/9: Depression screen [facility] 2/9 02/18/2018 02/10/2018 05/13/2017 08/12/2016 04/15/2016  Decreased Interest 0 0 0 0 0  Down, Depressed, Hopeless 0 0 0 0 0  PHQ - 2 Score 0 0 0 0 0  Altered sleeping 3 - - - -  Tired,  decreased energy 1 - - - -  Change in appetite 3 - - - -  Feeling bad or failure about yourself  3 - - - -  Trouble concentrating 0 - - - -  Moving slowly or fidgety/restless 0 - - - -  Suicidal thoughts 0 - - - -  PHQ-9 Score - - - - -  Difficult doing work/chores Somewhat difficult - - - -     Fall Risk: Fall Risk  02/18/2018 02/10/2018 05/13/2017 08/12/2016 04/15/2016  Falls in the past year? Yes Yes No No No  Number falls in past yr: 1 1 - - -  Injury with Fall? Yes Yes - - -  Comment - Broke her left arm and leg - - -     Functional Status Survey: Is the patient deaf or have difficulty hearing?: No Does the patient have difficulty seeing, even when wearing glasses/contacts?: No Does the patient have difficulty concentrating, remembering, or making decisions?: No Does the patient have difficulty walking or climbing stairs?: No Does the patient have difficulty dressing or bathing?: No Does the patient have difficulty doing errands alone such as visiting a doctor's office or shopping?: No    Assessment & Plan  1. Chronic combined systolic and diastolic CHF, NYHA class 1 (HCC)  - torsemide (DEMADEX) 20 MG tablet; Take 1 tablet (20 mg total) by mouth daily.  Dispense: 90 tablet; Refill: 1  2. Simple chronic bronchitis (HCC)  Resume Spiriva, states when she takes daily it controls shortness or breath   3. Paroxysmal atrial fibrillation ([place])  Rate controlled, last INR not at goal, gets it monitored at cardiologist office  4. Hyperglycemia  - Hemoglobin A1c  5. Migraine with aura and without status migrainosus, not intractable   6. Dyslipidemia  - Lipid panel  7. Benign hypertension  - BASIC METABOLIC PANEL WITH GFR - torsemide (DEMADEX) 20 MG tablet; Take 1 tablet (20 mg total) by mouth daily.  Dispense: 90 tablet; Refill: 1  8. Dysmetabolic syndrome   9. Vitamin D deficiency  - VITAMIN D 25 Hydroxy (Vit-D Deficiency, Fractures)  10. Acute gout of right  ankle, unspecified cause  - predniSONE (STERAPRED UNI-PAK 21 TAB) 10 MG (21) TBPK tablet; Take as directed  Dispense: 21 tablet; Refill: 0  11. Morbid obesity ([place])  Based of BMI and HTN, dyslipidemia, discussed life style modification   12. Controlled gout  - allopurinol (ZYLOPRIM) 300 MG tablet; Take 1 tablet (300 mg total) by mouth daily.  Dispense: 90 tablet; Refill: 1  13. Ovarian failure  - DG Bone Density; Future   15. Major depression, recurrent, chronic (HCC)  Refuses medication

## 2018-02-19 ENCOUNTER — Ambulatory Visit: Payer: BC Managed Care – PPO | Admitting: Physical Therapy

## 2018-02-19 ENCOUNTER — Telehealth: Payer: Self-pay

## 2018-02-19 LAB — BASIC METABOLIC PANEL WITH GFR
BUN: 20 mg/dL (ref 7–25)
CO2: 31 mmol/L (ref 20–32)
CREATININE: 0.84 mg/dL (ref 0.60–0.93)
Calcium: 9.3 mg/dL (ref 8.6–10.4)
Chloride: 102 mmol/L (ref 98–110)
GFR, EST AFRICAN AMERICAN: 79 mL/min/{1.73_m2} (ref >=60)
GFR, Est Non African American: 68 mL/min/{1.73_m2} (ref >=60)
Glucose, Bld: 90 mg/dL (ref 65–139)
POTASSIUM: 3.5 mmol/L (ref 3.5–5.3)
SODIUM: 140 mmol/L (ref 135–146)

## 2018-02-19 LAB — LIPID PANEL
CHOL/HDL RATIO: 5 (calc) — AB (ref <5.0)
Cholesterol: 267 mg/dL — ABNORMAL HIGH (ref <200)
HDL: 53 mg/dL (ref >50)
LDL Cholesterol (Calc): 183 mg/dL (calc) — ABNORMAL HIGH
NON-HDL CHOLESTEROL (CALC): 214 mg/dL — AB (ref <130)
TRIGLYCERIDES: 162 mg/dL — AB (ref <150)

## 2018-02-19 LAB — VITAMIN D 25 HYDROXY (VIT D DEFICIENCY, FRACTURES): VIT D 25 HYDROXY: 23 ng/mL — AB (ref 30–100)

## 2018-02-19 LAB — HEMOGLOBIN A1C
Hgb A1c MFr Bld: 5.5 % of total Hgb (ref <5.7)
Mean Plasma Glucose: 111 (calc)
eAG (mmol/L): 6.2 (calc)

## 2018-02-19 NOTE — Telephone Encounter (Signed)
Called to schedule AWV w/ [facility]. LVM requesting returned call.

## 2018-02-21 ENCOUNTER — Other Ambulatory Visit: Payer: Self-pay | Admitting: Family Medicine

## 2018-02-21 DIAGNOSIS — I480 Paroxysmal atrial fibrillation: Secondary | ICD-10-CM

## 2018-02-25 ENCOUNTER — Other Ambulatory Visit: Payer: Self-pay | Admitting: Family Medicine

## 2018-02-26 DIAGNOSIS — I480 Paroxysmal atrial fibrillation: Secondary | ICD-10-CM | POA: Diagnosis not present

## 2018-03-02 ENCOUNTER — Telehealth: Payer: Self-pay | Admitting: Family Medicine

## 2018-03-02 ENCOUNTER — Ambulatory Visit: Payer: BC Managed Care – PPO | Admitting: Physical Therapy

## 2018-03-02 NOTE — Telephone Encounter (Signed)
Copied from [place] [id number]. Topic: Quick Communication - Rx Refill/Question >> Mar 02, 2018  8:42 AM Scherrie Gerlach wrote: Medication: COLCRYS 0.6 MG tablet   Pt states the prednisone did not work on her gout. Pt still has gout in right ankle and getting worse.  Pt is requesting this med as she says this is what helps her. Pt is trying to work and is in a lot of pain. Pt just seen 02/18/18 and Dr is aware of this issues   [id number] - [place], [place] - [address] [phone] (Phone) [phone] (Fax)

## 2018-03-06 ENCOUNTER — Emergency Department
Admission: EM | Admit: 2018-03-06 | Discharge: 2018-03-06 | Disposition: A | Discharge status: Home / Self Care | Payer: Medicare Other | Attending: Emergency Medicine | Admitting: Emergency Medicine

## 2018-03-06 ENCOUNTER — Other Ambulatory Visit: Payer: Self-pay

## 2018-03-06 DIAGNOSIS — Y9389 Activity, other specified: Secondary | ICD-10-CM | POA: Insufficient documentation

## 2018-03-06 DIAGNOSIS — Z79899 Other long term (current) drug therapy: Secondary | ICD-10-CM | POA: Diagnosis not present

## 2018-03-06 DIAGNOSIS — I4891 Unspecified atrial fibrillation: Secondary | ICD-10-CM | POA: Diagnosis not present

## 2018-03-06 DIAGNOSIS — Z87891 Personal history of nicotine dependence: Secondary | ICD-10-CM | POA: Diagnosis not present

## 2018-03-06 DIAGNOSIS — I110 Hypertensive heart disease with heart failure: Secondary | ICD-10-CM | POA: Diagnosis not present

## 2018-03-06 DIAGNOSIS — Y92000 Kitchen of unspecified non-institutional (private) residence as  the place of occurrence of the external cause: Secondary | ICD-10-CM | POA: Diagnosis not present

## 2018-03-06 DIAGNOSIS — I5042 Chronic combined systolic (congestive) and diastolic (congestive) heart failure: Secondary | ICD-10-CM | POA: Diagnosis not present

## 2018-03-06 DIAGNOSIS — Y999 Unspecified external cause status: Secondary | ICD-10-CM | POA: Diagnosis not present

## 2018-03-06 DIAGNOSIS — W1789XA Other fall from one level to another, initial encounter: Secondary | ICD-10-CM | POA: Diagnosis not present

## 2018-03-06 DIAGNOSIS — Z7982 Long term (current) use of aspirin: Secondary | ICD-10-CM | POA: Diagnosis not present

## 2018-03-06 DIAGNOSIS — S0101XA Laceration without foreign body of scalp, initial encounter: Secondary | ICD-10-CM | POA: Diagnosis not present

## 2018-03-06 MED ORDER — SULFAMETHOXAZOLE-TRIMETHOPRIM 800-160 MG PO TABS: 1.0000 | ORAL_TABLET | Freq: Two times a day (BID) | ORAL | 0 refills | Status: DC

## 2018-03-06 NOTE — ED Notes (Signed)
Pt reported that she was stepping down from standing in a chair when she fell and hit her head on the refridgerator door. Pt states that she had a hair pin in her head at the time and it dug in her head a bit. She denies LOC. Pt alert and oriented x 4. Family at bedside. P.A. At bedside.

## 2018-03-06 NOTE — ED Triage Notes (Addendum)
Patient reports she was up on chair getting something out of cabinet and when she stepped down lost balance and fell backward into fridge, denies loss of consciousness.  Patient with laceration to scalp.  Patient does take a blood thinner.

## 2018-03-06 NOTE — ED Provider Notes (Signed)
[facility] Emergency Department Provider Note  ____________________________________________  Time seen: Approximately 9:58 PM  I have reviewed the triage vital signs and the nursing notes.   HISTORY  Chief Complaint Laceration    HPI [name] is a 77 y.o. female who presents the emergency department for scalp laceration.  Patient reports that she was standing on a chair in her kitchen when she misstepped off the chair.  Patient reports that she stepped onto the ground, lost her balance and took 2 steps striking her head on the refrigerator.  Patient reports that she was wearing a metal hair clip and she feels that the contact with a hair clip caused a laceration to the left scalp.  Patient is on blood thinners and states that it initially bled freely but she was able to control the bleeding with direct pressure.  Patient currently denies any headache, vision changes, neck pain.  She is not dizzy, nauseated.  No emesis.  Patient's main concern at this time is scalp laceration.  Patient has a history of A. fib, CHF, COPD.  She denies any complaints with chronic medical problems.    Past Medical History:  Diagnosis Date  . Atrial fibrillation ([place])   . Breast cancer ([place]) 1992   positive, radiation  . CHF (congestive heart failure) ([place])   . COPD (chronic obstructive pulmonary disease) ([place])   . History of aortic valve replacement   . Hyperlipidemia     Patient Active Problem List   Diagnosis Date Noted  . Chronic anticoagulation 08/27/2017  . Closed fracture of distal ends of left radius and ulna 08/24/2017  . Tibial plateau fracture, left 08/01/2017  . Chronic obstructive pulmonary disease ([place]) 07/04/2015  . Controlled gout 06/06/2015  . Anxiety 02/09/2015  . Synovial cyst of popliteal space 02/09/2015  . Benign hypertension 02/09/2015  . Blood type A+ 02/09/2015  . Arterial branch occlusion of retina 02/09/2015  . Chronic constipation  02/09/2015  . Insomnia, persistent 02/09/2015  . Chronic combined systolic and diastolic CHF, NYHA class 1 ([place]) 02/09/2015  . Compromised kidney function 02/09/2015  . Dyslipidemia 02/09/2015  . Atrial fibrillation ([place]) 02/09/2015  . Arthritis urica 02/09/2015  . Hearing loss 02/09/2015  . History of open heart surgery 02/09/2015  . Chronic hoarseness 02/09/2015  . Cardiomegaly 02/09/2015  . Dysmetabolic syndrome 29/51/8841  . Migraine with aura and without status migrainosus, not intractable 02/09/2015  . Adult BMI 30+ 02/09/2015  . Hypertensive pulmonary vascular disease ([place]) 02/09/2015  . Allergic rhinitis, seasonal 02/09/2015  . Vitamin D deficiency 02/09/2015  . History of mitral valve repair 10/01/2012  . History of aortic valve replacement 10/01/2012  . Congestive heart failure ([place]) 10/01/2012    Past Surgical History:  Procedure Laterality Date  . ABDOMINAL HYSTERECTOMY  1996  . BREAST BIOPSY Right 1998   neg  . BREAST BIOPSY Right 2007   neg  . BREAST BIOPSY Right 1992   positive  . BREAST EXCISIONAL BIOPSY Left 2000   neg  . CARDIAC VALVE REPLACEMENT  2014    Prior to Admission medications   Medication Sig Start Date End Date Taking? Authorizing Provider  acetaminophen (TYLENOL) 500 MG tablet Take 1,000-1,500 mg by mouth every 6 (six) hours as needed for mild pain.    [provider]  allopurinol (ZYLOPRIM) 300 MG tablet Take 1 tablet (300 mg total) by mouth daily. 02/18/18   [name], MD  aspirin EC 81 MG tablet Take 1 tablet by mouth  daily.    [provider]  atorvastatin (LIPITOR) 80 MG tablet Take 1 tablet by mouth at bedtime.    [provider]  Calcium Carbonate-Vitamin D 600-400 MG-UNIT tablet Take 1 tablet by mouth 2 (two) times daily. 08/04/17 08/04/18  [provider]  ezetimibe (ZETIA) 10 MG tablet Take 1 tablet by mouth daily.    [provider]  KLOR-CON M20 20 MEQ tablet TAKE 1 TABLET BY MOUTH  ONCE A DAY 02/25/18   Ancil Boozer, Drue Stager, MD  losartan (COZAAR) 25 MG tablet Take 0.5 tablets by mouth daily.    [provider]  metoprolol tartrate (LOPRESSOR) 25 MG tablet TAKE 1 TABLET BY MOUTH TWICE A DAY 02/22/18   Sowles, Drue Stager, MD  polyethylene glycol (MIRALAX / GLYCOLAX) packet Take 17 g by mouth daily.    [provider]  predniSONE (STERAPRED UNI-PAK 21 TAB) 10 MG (21) TBPK tablet Take as directed 02/18/18   [name], MD  senna-docusate (SENOKOT-S) 8.6-50 MG tablet Take 2 tablets by mouth 2 (two) times daily. 08/04/17 08/04/18  [provider]  sulfamethoxazole-trimethoprim (BACTRIM DS,SEPTRA DS) 800-160 MG tablet Take 1 tablet by mouth 2 (two) times daily. 03/06/18   Coner Gibbard, Roderic Palau D, PA-C  tiotropium (SPIRIVA HANDIHALER) 18 MCG inhalation capsule INHALE 1 CASPULE VIA HANDIHALER ONCE A DAY AT THE SAME TIME EVERY DAY 02/18/18   [name], MD  torsemide (DEMADEX) 20 MG tablet Take 1 tablet (20 mg total) by mouth daily. 02/18/18   [name], MD  traZODone (DESYREL) 50 MG tablet TAKE 1 TABLET BY MOUTH EVERYDAY AT BEDTIME 12/28/17   [name], MD  metoprolol succinate (TOPROL-XL) 25 MG 24 hr tablet Take 1 tablet (25 mg total) by mouth daily. 06/06/15 07/17/15  [name], MD    Allergies Ace inhibitors; Amoxicillin-pot clavulanate; Codeine; Penicillins; Rosuvastatin; and Tetanus-diphtheria toxoids td  Family History  Problem Relation Age of Onset  . Cancer Mother   . Diabetes Mother   . Hypertension Mother   . Breast cancer Mother 81  . Hypertension Father   . Heart failure Father   . Cancer Brother        bladder  . Cancer Sister        breast  . Breast cancer Sister 53  . Hypertension Son   . Hypertension Daughter   . Breast cancer Sister 44    Social History Social History   Tobacco Use  . Smoking status: Former Smoker    Last attempt to quit: 09/09/2007    Years since quitting: 10.4  . Smokeless tobacco: Never Used   Substance Use Topics  . Alcohol use: No    Alcohol/week: 0.0 oz  . Drug use: No     Review of Systems  Constitutional: No fever/chills Eyes: No visual changes.  Cardiovascular: no chest pain. Respiratory: no cough. No SOB. Gastrointestinal: No abdominal pain.  No nausea, no vomiting.  Musculoskeletal: Negative for musculoskeletal pain. Skin: Positive for left-sided posterior scalp laceration. Neurological: Negative for headaches, focal weakness or numbness. 10-point ROS otherwise negative.  ____________________________________________   PHYSICAL EXAM:  VITAL SIGNS: ED Triage Vitals [03/06/18 2144]  Enc Vitals Group     BP (!) 138/96     Pulse Rate (!) 50     Resp 18     Temp 98.1 F (36.7 C)     Temp Source Oral     SpO2 98 %     Weight      Height  Head Circumference      Peak Flow      Pain Score 7     Pain Loc      Pain Edu?      Excl. in [place]?      Constitutional: Alert and oriented. Well appearing and in no acute distress. Eyes: Conjunctivae are normal. PERRL. EOMI. Head: Visualization of the scalp reveals congealed blood to the left posterior scalp.  Otherwise, no visible abnormality.  Patient has no raccoon eyes, battle signs, serosanguineous fluid drainage from ears or nares.  Patient has approximately 4 cm superficial laceration to the left parietal scalp.  No bleeding at this time.  No visible foreign body.  Patient is nontender to palpation over the skull in this region. ENT:      Ears:       Nose: No congestion/rhinnorhea.      Mouth/Throat: Mucous membranes are moist.  Neck: No stridor.  No cervical spine tenderness to palpation.  Cardiovascular: Normal rate, regular rhythm. Normal S1 and S2.  Good peripheral circulation. Respiratory: Normal respiratory effort without tachypnea or retractions. Lungs CTAB. Good air entry to the bases with no decreased or absent breath sounds. Musculoskeletal: Full range of motion to all extremities. No gross  deformities appreciated. Neurologic:  Normal speech and language. No gross focal neurologic deficits are appreciated.  Cranial nerves II through XII grossly intact.  Negative Romberg's and pronator drift. Skin:  Skin is warm, dry and intact. No rash noted.  The above head note for laceration to the scalp. Psychiatric: Mood and affect are normal. Speech and behavior are normal. Patient exhibits appropriate insight and judgement.   ____________________________________________   LABS (all labs ordered are listed, but only abnormal results are displayed)  Labs Reviewed - No data to display ____________________________________________  EKG   ____________________________________________  RADIOLOGY   No results found.  ____________________________________________    PROCEDURES  Procedure(s) performed:    Procedures    Medications - No data to display      ____________________________________________   INITIAL IMPRESSION / ASSESSMENT AND PLAN / ED COURSE  Pertinent labs & imaging results that were available during my care of the patient were reviewed by me and considered in my medical decision making (see chart for details).  Review of the [place] CSRS was performed in accordance of the [place] prior to dispensing any controlled drugs.      Patient's diagnosis is consistent with scalp laceration.  Patient presents the emergency department complaining of laceration to the left scalp.  Patient was standing on a chair, stepped off, lost her balance and hit her head on the side of the refrigerator.  Patient had a hair clip which likely cause laceration.  At this time, exam is reassuring with no focal deficits neurologically.  At this time, no indication for imaging.  Patient has a relatively superficial laceration to the left scalp.  I discussed options to include stabilization of the wound with staples, however the laceration does not gaped open and does not require sutures oral  staples for closure.  After discussion of wound care, patient opts for no stabilization with staples.  I feel that this is reasonable.  Given location, I will place patient on antibiotics prophylactically.  Tylenol at home as needed for any pain complaint.  Patient is to follow-up with primary care as needed. Patient is given ED precautions to return to the ED for any worsening or new symptoms.     ____________________________________________  FINAL CLINICAL IMPRESSION(S) /  ED DIAGNOSES  Final diagnoses:  Laceration of scalp, initial encounter      NEW MEDICATIONS STARTED DURING THIS VISIT:  ED Discharge Orders        Ordered    sulfamethoxazole-trimethoprim (BACTRIM DS,SEPTRA DS) 800-160 MG tablet  2 times daily     03/06/18 2214          This chart was dictated using voice recognition software/Dragon. Despite best efforts to proofread, errors can occur which can change the meaning. Any change was purely unintentional.    Darletta Moll, PA-C 03/06/18 2214    Schuyler Amor, MD 03/06/18 2235

## 2018-03-09 ENCOUNTER — Encounter: Payer: Medicare Other | Admitting: Physical Therapy

## 2018-03-10 ENCOUNTER — Encounter: Payer: Medicare Other | Admitting: Physical Therapy

## 2018-03-16 ENCOUNTER — Encounter: Payer: Medicare Other | Admitting: Physical Therapy

## 2018-03-18 ENCOUNTER — Encounter: Payer: Medicare Other | Admitting: Physical Therapy

## 2018-03-19 ENCOUNTER — Ambulatory Visit: Payer: Medicare Other | Admitting: Occupational Therapy

## 2018-03-19 ENCOUNTER — Encounter: Payer: Medicare Other | Admitting: Physical Therapy

## 2018-03-19 DIAGNOSIS — H25012 Cortical age-related cataract, left eye: Secondary | ICD-10-CM | POA: Diagnosis not present

## 2018-03-23 ENCOUNTER — Ambulatory Visit: Payer: Medicare Other | Admitting: Family Medicine

## 2018-03-25 ENCOUNTER — Encounter: Payer: Medicare Other | Admitting: Occupational Therapy

## 2018-03-25 ENCOUNTER — Other Ambulatory Visit: Payer: Self-pay | Admitting: Family Medicine

## 2018-03-25 ENCOUNTER — Ambulatory Visit: Payer: Medicare Other | Attending: Pediatrics | Admitting: Occupational Therapy

## 2018-03-25 ENCOUNTER — Encounter: Payer: Self-pay | Admitting: *Deleted

## 2018-03-25 DIAGNOSIS — M25642 Stiffness of left hand, not elsewhere classified: Secondary | ICD-10-CM | POA: Diagnosis not present

## 2018-03-25 DIAGNOSIS — M25632 Stiffness of left wrist, not elsewhere classified: Secondary | ICD-10-CM | POA: Insufficient documentation

## 2018-03-25 DIAGNOSIS — M6281 Muscle weakness (generalized): Secondary | ICD-10-CM | POA: Diagnosis not present

## 2018-03-25 DIAGNOSIS — M25532 Pain in left wrist: Secondary | ICD-10-CM | POA: Insufficient documentation

## 2018-03-25 NOTE — Patient Instructions (Signed)
Cont with L wrist flexion and extention stretch  And then putty as needed

## 2018-03-25 NOTE — Telephone Encounter (Signed)
Refill request was sent to Dr. Krichna Sowles for approval and submission.  

## 2018-03-25 NOTE — Therapy (Signed)
[facility] [address] [place], [address] Phone: [phone]   Fax:  [phone]  Occupational Therapy Treatment  Patient Details  Name: KARIMAH WINQUIST MRN: [id number] Date of Birth: [birth year] Referring Provider: Regis Bill   Encounter Date: 03/25/2018  OT End of Session - 03/25/18 1445    Visit Number  7    Number of Visits  7    Date for OT Re-Evaluation  03/25/18    OT Start Time  1420    OT Stop Time  1440    OT Time Calculation (min)  20 min    Activity Tolerance  Patient tolerated treatment well    Behavior During Therapy  [facility] for tasks assessed/performed       Past Medical History:  Diagnosis Date  . Atrial fibrillation ([place])   . Breast cancer ([place]) 1992   positive, radiation  . CHF (congestive heart failure) ([place])   . COPD (chronic obstructive pulmonary disease) ([place])   . Dysrhythmia   . Gout   . History of aortic valve replacement   . Hyperlipidemia   . Hypertension     Past Surgical History:  Procedure Laterality Date  . ABDOMINAL HYSTERECTOMY  1996  . BREAST BIOPSY Right 1998   neg  . BREAST BIOPSY Right 2007   neg  . BREAST BIOPSY Right 1992   positive  . BREAST EXCISIONAL BIOPSY Left 2000   neg  . CARDIAC VALVE REPLACEMENT  2014  . FRACTURE SURGERY Left    ARM/ LEG    There were no vitals filed for this visit.  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  No/denies         [facility] OT Assessment - 03/25/18 0001       AROM   Left Wrist Extension  53 Degrees    Left Wrist Flexion  65 Degrees    Left Wrist Radial Deviation  16 Degrees    Left Wrist Ulnar Deviation  25 Degrees      Strength   Right Hand Grip (lbs)  75    Right Hand Lateral Pinch  16 lbs    Right Hand 3 Point Pinch  14 lbs    Left Hand Grip (lbs)  35    Left Hand Lateral Pinch  15 lbs    Left Hand 3 Point Pinch  11 lbs       Reassess AROM and strength in L wrist- 5/5 in range  And grip and prehension - see flow sheet   Review with pt functional use - can carry 10 lbs and lift with no pain or symptoms  push and pull heavy door  No issues     Cont with L wrist flexion and extention stretch  And then putty as needed  for few more wks                 OT Education - 03/25/18 1445    Education provided  Yes    Education Details  discharge instruction     Person(s) Educated  Patient    Methods  Explanation;Demonstration    Comprehension  Returned demonstration;Verbalized understanding       OT Short Term Goals - 03/25/18 1449      OT SHORT TERM GOAL #1   Title  Pt to be independent in HEP to increase AROM in digits , wrist to use L hand in more than 50% of bathing , dressing , and cooking     Status  Achieved      OT SHORT TERM GOAL #2   Title  L digits AROM improve for pt to make tight fist and open digits to reach into pocket / play keyboard     Status  Achieved      OT SHORT TERM GOAL #3   Title  L wrist AROM improve with more than 10-15 degrees to turn doorknob, bath, fasten bra, stir and cut in cooking     Status  Achieved        OT Long Term Goals - 03/25/18 1449      OT LONG TERM GOAL #1   Title  Grip strength and prehension improve to more than 50% compare to R hand to return to work    Baseline  improve but grip on R 70 and L 35 lbs     Status  Achieved      OT LONG TERM GOAL #2   Title  Function score on PRWHE improve with more than 20 points     Baseline  at eval function score  on PRWHE 41/50  - now 0/50    Status  Achieved      OT LONG TERM GOAL #3   Title  Strength in L wrist improve to 4+/5 to carry  and lift more than 5 lbs to perform her job , push door open     Baseline  carry 10 lbs without pain - wrist strength 5/5     Status  Achieved            Plan - 03/25/18 1446    Clinical Impression Statement  Pt return back to OT for reassessment after doing HEP for month on her own - she is just over 3 months out from plate removal at wrist - pt cont to show improvement in grip and prehension strength as well as ROM - pt to cont with HEP for few more months for ext, flexion and grip - pt returned to work and able to to most everything - was able to carry 10 lbs in clinic with no issues and push and pull open heavy door - pt discharge at this time from [place]  discharge with HEP     OT Home Exercise Plan  see pt instruction    Consulted and Agree with Plan of Care  Patient       Patient will benefit from skilled therapeutic intervention in order to improve the following deficits and impairments:     Visit Diagnosis: Muscle weakness (generalized) - Plan: Ot plan of care cert/re-cert  Stiffness of left wrist, not elsewhere classified - Plan: Ot plan of care cert/re-cert  Stiffness of left hand, not elsewhere classified - Plan: Ot plan of care cert/re-cert  Pain in left wrist - Plan: Ot plan of care cert/re-cert    Problem List Patient Active Problem List   Diagnosis Date Noted  . Chronic anticoagulation 08/27/2017  . Closed fracture of distal ends of left radius and ulna 08/24/2017  . Tibial plateau fracture, left 08/01/2017  . Chronic obstructive pulmonary disease ([place]) 07/04/2015  . Controlled gout 06/06/2015  . Anxiety 02/09/2015  . Synovial cyst of popliteal space 02/09/2015  . Benign hypertension 02/09/2015  . Blood type A+ 02/09/2015  . Arterial branch occlusion of retina 02/09/2015  . Chronic constipation 02/09/2015  . Insomnia,  persistent 02/09/2015  . Chronic combined systolic and diastolic CHF, NYHA class 1 ([place]) 02/09/2015  . Compromised kidney function 02/09/2015  . Dyslipidemia 02/09/2015  . Atrial fibrillation ([place]) 02/09/2015  . Arthritis urica 02/09/2015  . Hearing loss 02/09/2015  . History of open heart surgery 02/09/2015  . Chronic hoarseness 02/09/2015  . Cardiomegaly 02/09/2015  . Dysmetabolic syndrome 04/88/8916  . Migraine with aura and without status migrainosus, not intractable 02/09/2015  . Adult BMI 30+ 02/09/2015  . Hypertensive pulmonary vascular disease ([place]) 02/09/2015  . Allergic rhinitis, seasonal 02/09/2015  . Vitamin D deficiency 02/09/2015  . History of mitral valve repair 10/01/2012  . History of aortic valve replacement 10/01/2012  . Congestive heart failure ([place]) 10/01/2012    Rosalyn Gess OTR/L,CLT 03/25/2018, 2:53 PM  [facility] [address], [place], [address] Phone: [phone]   Fax:  [phone]  Name: JACKILYN UMPHLETT MRN: [id number] Date of Birth: 08-03-1941

## 2018-03-30 ENCOUNTER — Ambulatory Visit
Admission: RE | Admit: 2018-03-30 | Discharge: 2018-03-30 | Disposition: A | Discharge status: Home / Self Care | Payer: Medicare Other | Source: Ambulatory Visit | Attending: Ophthalmology | Admitting: Ophthalmology

## 2018-03-30 ENCOUNTER — Ambulatory Visit: Payer: Medicare Other | Admitting: Anesthesiology

## 2018-03-30 ENCOUNTER — Encounter: Payer: Self-pay | Admitting: Ophthalmology

## 2018-03-30 ENCOUNTER — Encounter
Admission: RE | Disposition: A | Discharge status: Home / Self Care | Payer: Self-pay | Source: Ambulatory Visit | Attending: Ophthalmology

## 2018-03-30 DIAGNOSIS — I4891 Unspecified atrial fibrillation: Secondary | ICD-10-CM | POA: Diagnosis not present

## 2018-03-30 DIAGNOSIS — E785 Hyperlipidemia, unspecified: Secondary | ICD-10-CM | POA: Diagnosis not present

## 2018-03-30 DIAGNOSIS — Z7901 Long term (current) use of anticoagulants: Secondary | ICD-10-CM | POA: Diagnosis not present

## 2018-03-30 DIAGNOSIS — J449 Chronic obstructive pulmonary disease, unspecified: Secondary | ICD-10-CM | POA: Diagnosis not present

## 2018-03-30 DIAGNOSIS — Z7982 Long term (current) use of aspirin: Secondary | ICD-10-CM | POA: Diagnosis not present

## 2018-03-30 DIAGNOSIS — M109 Gout, unspecified: Secondary | ICD-10-CM | POA: Insufficient documentation

## 2018-03-30 DIAGNOSIS — H2512 Age-related nuclear cataract, left eye: Secondary | ICD-10-CM | POA: Insufficient documentation

## 2018-03-30 DIAGNOSIS — I110 Hypertensive heart disease with heart failure: Secondary | ICD-10-CM | POA: Insufficient documentation

## 2018-03-30 DIAGNOSIS — Z87891 Personal history of nicotine dependence: Secondary | ICD-10-CM | POA: Diagnosis not present

## 2018-03-30 DIAGNOSIS — Z79899 Other long term (current) drug therapy: Secondary | ICD-10-CM | POA: Insufficient documentation

## 2018-03-30 DIAGNOSIS — I5020 Unspecified systolic (congestive) heart failure: Secondary | ICD-10-CM | POA: Diagnosis not present

## 2018-03-30 DIAGNOSIS — M1990 Unspecified osteoarthritis, unspecified site: Secondary | ICD-10-CM | POA: Insufficient documentation

## 2018-03-30 DIAGNOSIS — I5042 Chronic combined systolic (congestive) and diastolic (congestive) heart failure: Secondary | ICD-10-CM | POA: Diagnosis not present

## 2018-03-30 DIAGNOSIS — Z952 Presence of prosthetic heart valve: Secondary | ICD-10-CM | POA: Diagnosis not present

## 2018-03-30 DIAGNOSIS — H25012 Cortical age-related cataract, left eye: Secondary | ICD-10-CM | POA: Diagnosis not present

## 2018-03-30 HISTORY — DX: Cardiac arrhythmia, unspecified: I49.9

## 2018-03-30 HISTORY — PX: CATARACT EXTRACTION W/PHACO: SHX586

## 2018-03-30 HISTORY — DX: Gout, unspecified: M10.9

## 2018-03-30 HISTORY — DX: Essential (primary) hypertension: I10

## 2018-03-30 SURGERY — PHACOEMULSIFICATION, CATARACT, WITH IOL INSERTION
Anesthesia: Monitor Anesthesia Care | Site: Eye | Laterality: Left | Wound class: "Clean "

## 2018-03-30 MED ORDER — EPINEPHRINE PF 1 MG/ML IJ SOLN
INTRAMUSCULAR | Status: AC
  Filled 2018-03-30: qty 2

## 2018-03-30 MED ORDER — EPINEPHRINE PF 1 MG/ML IJ SOLN
INTRAOCULAR | Status: DC | PRN
  Administered 2018-03-30: 08:00:00 via OPHTHALMIC

## 2018-03-30 MED ORDER — MOXIFLOXACIN HCL 0.5 % OP SOLN: 1.0000 [drp] | OPHTHALMIC | Status: DC | PRN

## 2018-03-30 MED ORDER — CYCLOPENTOLATE HCL 2 % OP SOLN
1.0000 [drp] | OPHTHALMIC | Status: AC
  Administered 2018-03-30 (×4): 1 [drp] via OPHTHALMIC

## 2018-03-30 MED ORDER — NA CHONDROIT SULF-NA HYALURON 40-17 MG/ML IO SOLN
INTRAOCULAR | Status: AC
  Filled 2018-03-30: qty 1

## 2018-03-30 MED ORDER — TETRACAINE HCL 0.5 % OP SOLN
1.0000 [drp] | Freq: Once | OPHTHALMIC | Status: AC
Start: 2018-03-30 — End: 2018-03-30
  Administered 2018-03-30 (×2): 1 [drp] via OPHTHALMIC

## 2018-03-30 MED ORDER — LIDOCAINE HCL (PF) 4 % IJ SOLN
INTRAMUSCULAR | Status: AC
  Filled 2018-03-30: qty 5

## 2018-03-30 MED ORDER — NA CHONDROIT SULF-NA HYALURON 40-17 MG/ML IO SOLN
INTRAOCULAR | Status: DC | PRN
  Administered 2018-03-30: 1 mL via INTRAOCULAR

## 2018-03-30 MED ORDER — PHENYLEPHRINE HCL 10 % OP SOLN
1.0000 [drp] | OPHTHALMIC | Status: AC
  Administered 2018-03-30 (×4): 1 [drp] via OPHTHALMIC

## 2018-03-30 MED ORDER — TETRACAINE HCL 0.5 % OP SOLN
OPHTHALMIC | Status: AC
  Administered 2018-03-30: 1 [drp] via OPHTHALMIC
  Filled 2018-03-30: qty 4

## 2018-03-30 MED ORDER — MIDAZOLAM HCL 2 MG/2ML IJ SOLN
INTRAMUSCULAR | Status: AC
  Filled 2018-03-30: qty 2

## 2018-03-30 MED ORDER — POVIDONE-IODINE 5 % OP SOLN
OPHTHALMIC | Status: AC
  Filled 2018-03-30: qty 30

## 2018-03-30 MED ORDER — LIDOCAINE HCL (PF) 4 % IJ SOLN
INTRAOCULAR | Status: DC | PRN
  Administered 2018-03-30: 4 mL via OPHTHALMIC

## 2018-03-30 MED ORDER — CYCLOPENTOLATE HCL 2 % OP SOLN
OPHTHALMIC | Status: AC
  Administered 2018-03-30: 1 [drp] via OPHTHALMIC
  Filled 2018-03-30: qty 2

## 2018-03-30 MED ORDER — MIDAZOLAM HCL 2 MG/2ML IJ SOLN
INTRAMUSCULAR | Status: DC | PRN
  Administered 2018-03-30: 1 mg via INTRAVENOUS

## 2018-03-30 MED ORDER — SODIUM CHLORIDE 0.9 % IV SOLN
INTRAVENOUS | Status: DC
  Administered 2018-03-30: 07:00:00 via INTRAVENOUS

## 2018-03-30 MED ORDER — MOXIFLOXACIN HCL 0.5 % OP SOLN
OPHTHALMIC | Status: DC | PRN
  Administered 2018-03-30: 0.2 mL via OPHTHALMIC

## 2018-03-30 MED ORDER — MOXIFLOXACIN HCL 0.5 % OP SOLN
OPHTHALMIC | Status: AC
  Filled 2018-03-30: qty 3

## 2018-03-30 MED ORDER — POVIDONE-IODINE 5 % OP SOLN
OPHTHALMIC | Status: DC | PRN
  Administered 2018-03-30: 1 via OPHTHALMIC

## 2018-03-30 MED ORDER — CARBACHOL 0.01 % IO SOLN
INTRAOCULAR | Status: DC | PRN
  Administered 2018-03-30: 0.5 mL via INTRAOCULAR

## 2018-03-30 MED ORDER — PHENYLEPHRINE HCL 10 % OP SOLN
OPHTHALMIC | Status: AC
  Administered 2018-03-30: 1 [drp] via OPHTHALMIC
  Filled 2018-03-30: qty 5

## 2018-03-30 SURGICAL SUPPLY — 16 items
GLOVE BIO SURGEON STRL SZ8 (GLOVE) ×2 IMPLANT
GLOVE BIOGEL M 6.5 STRL (GLOVE) ×2 IMPLANT
GLOVE SURG LX 8.0 MICRO (GLOVE) ×1
GLOVE SURG LX STRL 8.0 MICRO (GLOVE) ×1 IMPLANT
GOWN STRL REUS W/ TWL LRG LVL3 (GOWN DISPOSABLE) ×2 IMPLANT
GOWN STRL REUS W/TWL LRG LVL3 (GOWN DISPOSABLE) ×2
LABEL CATARACT MEDS ST (LABEL) ×2 IMPLANT
LENS IOL TECNIS ITEC 20.0 (Intraocular Lens) ×1 IMPLANT
PACK CATARACT (MISCELLANEOUS) ×2 IMPLANT
PACK CATARACT BRASINGTON LX (MISCELLANEOUS) ×2 IMPLANT
PACK EYE AFTER SURG (MISCELLANEOUS) ×2 IMPLANT
SOL BSS BAG (MISCELLANEOUS) ×2
SOLUTION BSS BAG (MISCELLANEOUS) ×1 IMPLANT
SYR 5ML LL (SYRINGE) ×2 IMPLANT
WATER STERILE IRR 250ML POUR (IV SOLUTION) ×2 IMPLANT
WIPE NON LINTING 3.25X3.25 (MISCELLANEOUS) ×2 IMPLANT

## 2018-03-30 NOTE — H&P (Signed)
All labs reviewed. Abnormal studies sent to patients PCP when indicated.  Previous H&P reviewed, patient examined, there are NO CHANGES.  Kelly Kempner Porfilio7/23/20198:08 AM

## 2018-03-30 NOTE — Op Note (Signed)
PREOPERATIVE DIAGNOSIS:  Nuclear sclerotic cataract of the left eye.   POSTOPERATIVE DIAGNOSIS:  Nuclear sclerotic cataract of the left eye.   OPERATIVE PROCEDURE: Procedure(s): CATARACT EXTRACTION PHACO AND INTRAOCULAR LENS PLACEMENT (IOC)   SURGEON:  Birder Robson, MD.   ANESTHESIA:  Anesthesiologist: Emmie Niemann, MD CRNA: Demetrius Charity, CRNA; Justus Memory, CRNA  1.      Managed anesthesia care. 2.     0.37ml of Shugarcaine was instilled following the paracentesis   COMPLICATIONS:  None.   TECHNIQUE:   Stop and chop   DESCRIPTION OF PROCEDURE:  The patient was examined and consented in the preoperative holding area where the aforementioned topical anesthesia was applied to the left eye and then brought back to the Operating Room where the left eye was prepped and draped in the usual sterile ophthalmic fashion and a lid speculum was placed. A paracentesis was created with the side port blade and the anterior chamber was filled with viscoelastic. A near clear corneal incision was performed with the steel keratome. A continuous curvilinear capsulorrhexis was performed with a cystotome followed by the capsulorrhexis forceps. Hydrodissection and hydrodelineation were carried out with BSS on a blunt cannula. The lens was removed in a stop and chop  technique and the remaining cortical material was removed with the irrigation-aspiration handpiece. The capsular bag was inflated with viscoelastic and the Technis [id number] lens was placed in the capsular bag without complication. The remaining viscoelastic was removed from the eye with the irrigation-aspiration handpiece. The wounds were hydrated. The anterior chamber was flushed with Miostat and the eye was inflated to physiologic pressure. 0.33ml Vigamox was placed in the anterior chamber. The wounds were found to be water tight. The eye was dressed with Vigamox. The patient was given protective glasses to wear throughout the day and a shield  with which to sleep tonight. The patient was also given drops with which to begin a drop regimen today and will follow-up with me in one day. Implant Name Type Inv. Item Serial No. Manufacturer Lot No. LRB No. Used  LENS IOL DIOP 20.0 - [id number] Intraocular Lens LENS IOL DIOP 20.0 [id number] AMO  Left 1    Procedure(s) with comments: CATARACT EXTRACTION PHACO AND INTRAOCULAR LENS PLACEMENT (IOC) (Left) - [place] 00:31.9 AP% 12.1 CDE 3.87 Fluid Pack lot # [id number]  Electronically signed: Birder Robson 03/30/2018 8:34 AM

## 2018-03-30 NOTE — Discharge Instructions (Signed)
Eye Surgery Discharge Instructions    Expect mild scratchy sensation or mild soreness. DO NOT RUB YOUR EYE!  The day of surgery:  Minimal physical activity, but bed rest is not required  No reading, computer work, or close hand work  No bending, lifting, or straining.  May watch TV  For 24 hours:  No driving, legal decisions, or alcoholic beverages  Safety precautions  Eat anything you prefer: It is better to start with liquids, then soup then solid foods.  _____ Eye patch should be worn until postoperative exam tomorrow.  ____ Solar shield eyeglasses should be worn for comfort in the sunlight/patch while sleeping  Resume all regular medications including aspirin or Coumadin if these were discontinued prior to surgery. You may shower, bathe, shave, or wash your hair. Tylenol may be taken for mild discomfort.  Call your doctor if you experience significant pain, nausea, or vomiting, fever > 101 or other signs of infection. [phone] or [phone] Specific instructions:  Follow-up Information    Birder Robson, MD Follow up.   Specialty:  Ophthalmology Why:  July 24 at 11:00am Contact information: [address] [place] [address] [phone]

## 2018-03-30 NOTE — Transfer of Care (Signed)
Immediate Anesthesia Transfer of Care Note  Patient: [name]  Procedure(s) Performed: CATARACT EXTRACTION PHACO AND INTRAOCULAR LENS PLACEMENT (IOC) (Left Eye)  Patient Location: PACU  Anesthesia Type:MAC  Level of Consciousness: awake, alert  and oriented  Airway & Oxygen Therapy: Patient Spontanous Breathing  Post-op Assessment: Report given to RN and Post -op Vital signs reviewed and stable  Post vital signs: Reviewed and stable  Last Vitals:  Vitals Value Taken Time  BP 138/70 0836  Temp 98.2   Pulse 76   Resp 16   SpO2 99     Last Pain:  Vitals:   03/30/18 0657  TempSrc:   PainSc: 0-No pain         Complications: No apparent anesthesia complications

## 2018-03-30 NOTE — Anesthesia Preprocedure Evaluation (Signed)
Anesthesia Evaluation  Patient identified by MRN, date of birth, ID band Patient awake    Reviewed: Allergy & Precautions, NPO status , Patient's Chart, lab work & pertinent test results  History of Anesthesia Complications Negative for: history of anesthetic complications  Airway Mallampati: II  TM Distance: >3 FB Neck ROM: Full    Dental  (+) Poor Dentition, Chipped   Pulmonary neg sleep apnea, COPD,  COPD inhaler, former smoker,    breath sounds clear to auscultation- rhonchi (-) wheezing      Cardiovascular hypertension, Pt. on medications + Peripheral Vascular Disease and +CHF  (-) CAD, (-) Past MI, (-) Cardiac Stents and (-) CABG + Valvular Problems/Murmurs (s/p AVR)  Rhythm:Regular Rate:Normal - Systolic murmurs and - Diastolic murmurs Echo 0/31/28: MILD LV SYSTOLIC DYSFUNCTION (See above) WITH SEVERE LVH NORMAL RIGHT VENTRICULAR SYSTOLIC FUNCTION MODERATE VALVULAR REGURGITATION (See above) NO VALVULAR STENOSIS MODERATE PHTN EF40-45%   Neuro/Psych  Headaches, Anxiety    GI/Hepatic negative GI ROS, Neg liver ROS,   Endo/Other  negative endocrine ROSneg diabetes  Renal/GU negative Renal ROS     Musculoskeletal  (+) Arthritis ,   Abdominal (+) + obese,   Peds  Hematology negative hematology ROS (+)   Anesthesia Other Findings Past Medical History: No date: Atrial fibrillation ([place]) 1992: Breast cancer ([place])     Comment:  positive, radiation No date: CHF (congestive heart failure) (HCC) No date: COPD (chronic obstructive pulmonary disease) (HCC) No date: Dysrhythmia No date: Gout No date: History of aortic valve replacement No date: Hyperlipidemia No date: Hypertension   Reproductive/Obstetrics                             Anesthesia Physical Anesthesia Plan  ASA: III  Anesthesia Plan: MAC   Post-op Pain Management:    Induction: Intravenous  PONV Risk Score and  Plan: 2 and Midazolam  Airway Management Planned: Natural Airway  Additional Equipment:   Intra-op Plan:   Post-operative Plan:   Informed Consent: I have reviewed the patients History and Physical, chart, labs and discussed the procedure including the risks, benefits and alternatives for the proposed anesthesia with the patient or authorized representative who has indicated his/her understanding and acceptance.     Plan Discussed with: CRNA and Anesthesiologist  Anesthesia Plan Comments:         Anesthesia Quick Evaluation

## 2018-03-30 NOTE — Anesthesia Post-op Follow-up Note (Signed)
Anesthesia QCDR form completed.        

## 2018-03-30 NOTE — Anesthesia Postprocedure Evaluation (Signed)
Anesthesia Post Note  Patient: [name]  Procedure(s) Performed: CATARACT EXTRACTION PHACO AND INTRAOCULAR LENS PLACEMENT (IOC) (Left Eye)  Patient location during evaluation: PACU Anesthesia Type: MAC Level of consciousness: awake and alert and oriented Pain management: pain level controlled Vital Signs Assessment: post-procedure vital signs reviewed and stable Respiratory status: spontaneous breathing, nonlabored ventilation and respiratory function stable Cardiovascular status: blood pressure returned to baseline and stable Postop Assessment: no signs of nausea or vomiting Anesthetic complications: no     Last Vitals:  Vitals:   03/30/18 0657 03/30/18 0836  BP: 139/80 138/70  Pulse: 73 (!) 52  Resp: 17 16  Temp:  36.8 C  SpO2: 100% 99%    Last Pain:  Vitals:   03/30/18 0836  TempSrc: Oral  PainSc: 0-No pain                 Lucette Kratz

## 2018-04-02 ENCOUNTER — Other Ambulatory Visit: Payer: Self-pay | Admitting: Family Medicine

## 2018-04-02 DIAGNOSIS — Z1231 Encounter for screening mammogram for malignant neoplasm of breast: Secondary | ICD-10-CM

## 2018-04-07 DIAGNOSIS — I480 Paroxysmal atrial fibrillation: Secondary | ICD-10-CM | POA: Diagnosis not present

## 2018-04-21 ENCOUNTER — Ambulatory Visit
Admission: RE | Admit: 2018-04-21 | Discharge: 2018-04-21 | Disposition: A | Discharge status: Home / Self Care | Payer: Medicare Other | Source: Ambulatory Visit | Attending: Family Medicine | Admitting: Family Medicine

## 2018-04-21 DIAGNOSIS — Z1231 Encounter for screening mammogram for malignant neoplasm of breast: Secondary | ICD-10-CM | POA: Diagnosis not present

## 2018-05-05 DIAGNOSIS — I480 Paroxysmal atrial fibrillation: Secondary | ICD-10-CM | POA: Diagnosis not present

## 2018-05-13 ENCOUNTER — Encounter: Payer: Self-pay | Admitting: Family Medicine

## 2018-05-13 ENCOUNTER — Ambulatory Visit (INDEPENDENT_AMBULATORY_CARE_PROVIDER_SITE_OTHER): Payer: Medicare Other | Admitting: Family Medicine

## 2018-05-13 VITALS — BP 136/70 | HR 65 | Temp 98.2°F | Resp 16 | Ht 64.0 in | Wt 176.5 lb

## 2018-05-13 DIAGNOSIS — Z23 Encounter for immunization: Secondary | ICD-10-CM | POA: Diagnosis not present

## 2018-05-13 DIAGNOSIS — H9192 Unspecified hearing loss, left ear: Secondary | ICD-10-CM

## 2018-05-13 DIAGNOSIS — R42 Dizziness and giddiness: Secondary | ICD-10-CM | POA: Diagnosis not present

## 2018-05-13 DIAGNOSIS — H9313 Tinnitus, bilateral: Secondary | ICD-10-CM | POA: Diagnosis not present

## 2018-05-13 MED ORDER — ONDANSETRON HCL 4 MG PO TABS: 4.0000 mg | ORAL_TABLET | Freq: Three times a day (TID) | ORAL | 0 refills | Status: DC | PRN

## 2018-05-13 MED ORDER — MECLIZINE HCL 12.5 MG PO TABS: 12.5000 mg | ORAL_TABLET | Freq: Three times a day (TID) | ORAL | 0 refills | Status: DC | PRN

## 2018-05-13 NOTE — Progress Notes (Signed)
Name: [name]   MRN: [id number]    DOB: 09-14-1940   Date:05/13/2018       Progress Note  Subjective  Chief Complaint  Chief Complaint  Patient presents with  . Dizziness    Has not had a episode in awhile-had one last night where the room was spinning.    HPI  Vertigo: she states had a similar episode 2 years ago but did not see ENT as recommended. She took Meclizine and anti-emetics and symptoms resolved. Woke up during the night with the room spinning. States symptoms improves when she sits up. She states spinning resolved, but is feeling a little dizzy ( light headed ) when stooping down at work this morning and had mild nausea. Currently feeling normal, but worried that symptoms will return. No chest pain, palpitation, weakness or tingling . No numbness. She has tinnitus on both ears intermittently and has noticed decrease hearing on the left side over the years.    Patient Active Problem List   Diagnosis Date Noted  . Chronic anticoagulation 08/27/2017  . History of right breast cancer 08/27/2017  . Closed fracture of distal ends of left radius and ulna 08/24/2017  . Tibial plateau fracture, left 08/01/2017  . Chronic obstructive pulmonary disease ([place]) 07/04/2015  . Controlled gout 06/06/2015  . Anxiety 02/09/2015  . Synovial cyst of popliteal space 02/09/2015  . Benign hypertension 02/09/2015  . Blood type A+ 02/09/2015  . Arterial branch occlusion of retina 02/09/2015  . Chronic constipation 02/09/2015  . Insomnia, persistent 02/09/2015  . Chronic combined systolic and diastolic CHF, NYHA class 1 ([place]) 02/09/2015  . Compromised kidney function 02/09/2015  . Dyslipidemia 02/09/2015  . Atrial fibrillation ([place]) 02/09/2015  . Arthritis urica 02/09/2015  . Hearing loss 02/09/2015  . History of open heart surgery 02/09/2015  . Chronic hoarseness 02/09/2015  . Cardiomegaly 02/09/2015  . Dysmetabolic syndrome 66/02/3015  . Migraine with aura and without status  migrainosus, not intractable 02/09/2015  . Adult BMI 30+ 02/09/2015  . Hypertensive pulmonary vascular disease ([place]) 02/09/2015  . Allergic rhinitis, seasonal 02/09/2015  . Vitamin D deficiency 02/09/2015  . History of mitral valve repair 10/01/2012  . History of aortic valve replacement 10/01/2012  . Congestive heart failure ([place]) 10/01/2012    Past Surgical History:  Procedure Laterality Date  . ABDOMINAL HYSTERECTOMY  1996  . BREAST BIOPSY Right 1998   neg  . BREAST BIOPSY Right 2007   neg  . BREAST BIOPSY Right 1992   positive  . BREAST EXCISIONAL BIOPSY Left 2000   neg  . CARDIAC VALVE REPLACEMENT  2014  . CATARACT EXTRACTION W/PHACO Left 03/30/2018   Procedure: CATARACT EXTRACTION PHACO AND INTRAOCULAR LENS PLACEMENT (IOC);  Surgeon: Birder Robson, MD;  Location: [facility] ORS;  Service: Ophthalmology;  Laterality: Left;  [place] 00:31.9 AP% 12.1 CDE 3.87 Fluid Pack lot # [id number]  . FRACTURE SURGERY Left    ARM/ LEG    Family History  Problem Relation Age of Onset  . Cancer Mother   . Diabetes Mother   . Hypertension Mother   . Breast cancer Mother 24  . Hypertension Father   . Heart failure Father   . Cancer Brother        bladder  . Cancer Sister        breast  . Breast cancer Sister 87  . Hypertension Son   . Hypertension Daughter   . Breast cancer Sister 39    Social History   Socioeconomic  History  . Marital status: Widowed    Spouse name: Not on file  . Number of children: 2  . Years of education: Not on file  . Highest education level: Not on file  Occupational History  . Occupation: Custodian  Social Needs  . Financial resource strain: Not on file  . Food insecurity:    Worry: Not on file    Inability: Not on file  . Transportation needs:    Medical: Not on file    Non-medical: Not on file  Tobacco Use  . Smoking status: Former Smoker    Last attempt to quit: 09/09/2007    Years since quitting: 10.6  . Smokeless tobacco: Never Used   Substance and Sexual Activity  . Alcohol use: No    Alcohol/week: 0.0 standard drinks  . Drug use: No  . Sexual activity: Not Currently  Lifestyle  . Physical activity:    Days per week: Not on file    Minutes per session: Not on file  . Stress: Not on file  Relationships  . Social connections:    Talks on phone: Not on file    Gets together: Not on file    Attends religious service: Not on file    Active member of club or organization: Not on file    Attends meetings of clubs or organizations: Not on file    Relationship status: Not on file  . Intimate partner violence:    Fear of current or ex partner: Not on file    Emotionally abused: Not on file    Physically abused: Not on file    Forced sexual activity: Not on file  Other Topics Concern  . Not on file  Social History Narrative  . Not on file     Current Outpatient Medications:  .  acetaminophen (TYLENOL) 500 MG tablet, Take 1,000-1,500 mg by mouth every 6 (six) hours as needed for mild pain., Disp: , Rfl:  .  allopurinol (ZYLOPRIM) 300 MG tablet, Take 1 tablet (300 mg total) by mouth daily., Disp: 90 tablet, Rfl: 1 .  aspirin EC 81 MG tablet, Take 1 tablet by mouth daily., Disp: , Rfl:  .  atorvastatin (LIPITOR) 80 MG tablet, Take 1 tablet by mouth at bedtime., Disp: , Rfl:  .  Calcium Carbonate-Vitamin D 600-400 MG-UNIT tablet, Take 1 tablet by mouth 2 (two) times daily., Disp: , Rfl:  .  digoxin (LANOXIN) 0.125 MG tablet, Take 1 tablet by mouth daily as needed (HEART PALPITATIONS). , Disp: , Rfl:  .  KLOR-CON M20 20 MEQ tablet, TAKE 1 TABLET BY MOUTH ONCE A DAY, Disp: 90 tablet, Rfl: 1 .  metoprolol tartrate (LOPRESSOR) 25 MG tablet, TAKE 1 TABLET BY MOUTH TWICE A DAY, Disp: 180 tablet, Rfl: 0 .  polyethylene glycol (MIRALAX / GLYCOLAX) packet, Take 17 g by mouth daily as needed for moderate constipation. , Disp: , Rfl:  .  tiotropium (SPIRIVA HANDIHALER) 18 MCG inhalation capsule, INHALE 1 CASPULE VIA HANDIHALER  ONCE A DAY AT THE SAME TIME EVERY DAY (Patient taking differently: Place 18 mcg into inhaler and inhale daily as needed (WHEEZING). ), Disp: 90 capsule, Rfl: 1 .  torsemide (DEMADEX) 20 MG tablet, Take 1 tablet (20 mg total) by mouth daily., Disp: 90 tablet, Rfl: 1 .  traZODone (DESYREL) 50 MG tablet, TAKE 1 TABLET BY MOUTH EVERYDAY AT BEDTIME, Disp: 90 tablet, Rfl: 1 .  warfarin (COUMADIN) 5 MG tablet, Take 5 mg by mouth daily., Disp: ,  Rfl:  .  meclizine (ANTIVERT) 12.5 MG tablet, Take 1 tablet (12.5 mg total) by mouth 3 (three) times daily as needed for dizziness., Disp: 20 tablet, Rfl: 0 .  ondansetron (ZOFRAN) 4 MG tablet, Take 1 tablet (4 mg total) by mouth every 8 (eight) hours as needed for nausea or vomiting., Disp: 20 tablet, Rfl: 0  Allergies  Allergen Reactions  . Ace Inhibitors Cough and Other (See Comments)  . Amoxicillin-Pot Clavulanate Nausea And Vomiting  . Codeine Nausea And Vomiting  . Penicillins Nausea And Vomiting and Other (See Comments)    Has patient had a PCN reaction causing immediate rash, facial/tongue/throat swelling, SOB or lightheadedness with hypotension: No Has patient had a PCN reaction causing severe rash involving mucus membranes or skin necrosis: No Has patient had a PCN reaction that required hospitalization No Has patient had a PCN reaction occurring within the last 10 years: No If all of the above answers are "NO", then may proceed with Cephalosporin use.  . Rosuvastatin Other (See Comments)    Reaction:  Joint pain   . Tetanus-Diphtheria Toxoids Td Swelling     ROS  Ten systems reviewed and is negative except as mentioned in HPI   Objective  Vitals:   05/13/18 0946 05/13/18 1009 05/13/18 1010  BP: (!) 154/96 138/74 136/70  Pulse: 65    Resp: 16    Temp: 98.2 F (36.8 C)    TempSrc: Oral    SpO2: 97%    Weight: 176 lb 8 oz (80.1 kg)    Height: 5\' 4"  (1.626 m)      Body mass index is 30.3 kg/m.  Physical Exam  Constitutional:  Patient appears well-developed and well-nourished. Obese  No distress.  HEENT: head atraumatic, normocephalic, pupils equal and reactive to light, ears normal TM bilaterally , neck supple, throat within normal limits Cardiovascular: Normal rate, regular rhythm and normal heart sounds.  No murmur heard. No BLE edema. Pulmonary/Chest: Effort normal and breath sounds normal. No respiratory distress. Abdominal: Soft.  There is no tenderness. Psychiatric: Patient has a normal mood and affect. behavior is normal. Judgment and thought content normal. Neurological: cranial nerves negative, no nystagmus, negative romberg, normal sensation, normal alternate movements   Recent Results (from the past 2160 hour(s))  BASIC METABOLIC PANEL WITH GFR     Status: None   Collection Time: 02/18/18  3:14 PM  Result Value Ref Range   Glucose, Bld 90 65 - 139 mg/dL    Comment: .        Non-fasting reference interval .    BUN 20 7 - 25 mg/dL   Creat 0.84 0.60 - 0.93 mg/dL    Comment: For patients >63 years of age, the reference limit for Creatinine is approximately 13% higher for people identified as African-American. .    GFR, Est Non African American 68 > OR = 60 mL/min/1.38m2   GFR, Est African American 79 > OR = 60 mL/min/1.75m2   BUN/Creatinine Ratio NOT APPLICABLE 6 - 22 (calc)   Sodium 140 135 - 146 mmol/L   Potassium 3.5 3.5 - 5.3 mmol/L   Chloride 102 98 - 110 mmol/L   CO2 31 20 - 32 mmol/L   Calcium 9.3 8.6 - 10.4 mg/dL  Lipid panel     Status: Abnormal   Collection Time: 02/18/18  3:14 PM  Result Value Ref Range   Cholesterol 267 (H) <200 mg/dL   HDL 53 >50 mg/dL   Triglycerides 162 (H) <150 mg/dL  LDL Cholesterol (Calc) 183 (H) mg/dL (calc)    Comment: Reference range: <100 . Desirable range <100 mg/dL for primary prevention;   <70 mg/dL for patients with CHD or diabetic patients  with > or = 2 CHD risk factors. Marland Kitchen LDL-C is now calculated using the Martin-Hopkins  calculation, which  is a validated novel method providing  better accuracy than the Friedewald equation in the  estimation of LDL-C.  Cresenciano Genre et al. Annamaria Helling. 2353;614(43): 2061-2068  (http://education.[facility].com/faq/FAQ164)    Total CHOL/HDL Ratio 5.0 (H) <5.0 (calc)   Non-HDL Cholesterol (Calc) 214 (H) <130 mg/dL (calc)    Comment: For patients with diabetes plus 1 major ASCVD risk  factor, treating to a non-HDL-C goal of <100 mg/dL  (LDL-C of <70 mg/dL) is considered a therapeutic  option.   VITAMIN D 25 Hydroxy (Vit-D Deficiency, Fractures)     Status: Abnormal   Collection Time: 02/18/18  3:14 PM  Result Value Ref Range   Vit D, 25-Hydroxy 23 (L) 30 - 100 ng/mL    Comment: Vitamin D Status         25-OH Vitamin D: . Deficiency:                    <20 ng/mL Insufficiency:             20 - 29 ng/mL Optimal:                 > or = 30 ng/mL . For 25-OH Vitamin D testing on patients on  D2-supplementation and patients for whom quantitation  of D2 and D3 fractions is required, the QuestAssureD(TM) 25-OH VIT D, (D2,D3), LC/MS/MS is recommended: order  code 458-064-4496 (patients >22yrs). . For more information on this test, go to: http://education.[facility].com/faq/FAQ163 (This link is being provided for  informational/educational purposes only.)   Hemoglobin A1c     Status: None   Collection Time: 02/18/18  3:14 PM  Result Value Ref Range   Hgb A1c MFr Bld 5.5 <5.7 % of total Hgb    Comment: For the purpose of screening for the presence of diabetes: . <5.7%       Consistent with the absence of diabetes 5.7-6.4%    Consistent with increased risk for diabetes             (prediabetes) > or =6.5%  Consistent with diabetes . This assay result is consistent with a decreased risk of diabetes. . Currently, no consensus exists regarding use of hemoglobin A1c for diagnosis of diabetes in children. . According to American Diabetes Association (ADA) guidelines, hemoglobin A1c <7.0%  represents optimal control in non-pregnant diabetic patients. Different metrics may apply to specific patient populations.  Standards of Medical Care in Diabetes(ADA). .    Mean Plasma Glucose 111 (calc)   eAG (mmol/L) 6.2 (calc)      PHQ2/9: Depression screen [facility] 2/9 05/13/2018 02/18/2018 02/10/2018 05/13/2017 08/12/2016  Decreased Interest 0 0 0 0 0  Down, Depressed, Hopeless 0 0 0 0 0  PHQ - 2 Score 0 0 0 0 0  Altered sleeping 0 3 - - -  Tired, decreased energy 0 1 - - -  Change in appetite 0 3 - - -  Feeling bad or failure about yourself  0 3 - - -  Trouble concentrating 0 0 - - -  Moving slowly or fidgety/restless 0 0 - - -  Suicidal thoughts 0 0 - - -  PHQ-9 Score 0 - - - -  Difficult doing work/chores Not difficult at all Somewhat difficult - - -     Fall Risk: Fall Risk  05/13/2018 02/18/2018 02/10/2018 05/13/2017 08/12/2016  Falls in the past year? Yes Yes Yes No No  Number falls in past yr: 1 1 1  - -  Injury with Fall? Yes Yes Yes - -  Comment - - Broke her left arm and leg - -     Functional Status Survey: Is the patient deaf or have difficulty hearing?: No Does the patient have difficulty seeing, even when wearing glasses/contacts?: Yes Does the patient have difficulty concentrating, remembering, or making decisions?: No Does the patient have difficulty walking or climbing stairs?: No Does the patient have difficulty dressing or bathing?: No Does the patient have difficulty doing errands alone such as visiting a doctor's office or shopping?: No    Assessment & Plan  1. Vertigo  - meclizine (ANTIVERT) 12.5 MG tablet; Take 1 tablet (12.5 mg total) by mouth 3 (three) times daily as needed for dizziness.  Dispense: 20 tablet; Refill: 0 - ondansetron (ZOFRAN) 4 MG tablet; Take 1 tablet (4 mg total) by mouth every 8 (eight) hours as needed for nausea or vomiting.  Dispense: 20 tablet; Refill: 0 - Ambulatory referral to ENT  2. Need for immunization against influenza  -  Flu vaccine HIGH DOSE PF (Fluzone High dose)  3. Change in hearing of left ear  - Ambulatory referral to ENT  4. Tinnitus aurium, bilateral  - Ambulatory referral to ENT

## 2018-06-02 ENCOUNTER — Other Ambulatory Visit: Payer: Self-pay | Admitting: Family Medicine

## 2018-06-02 DIAGNOSIS — I480 Paroxysmal atrial fibrillation: Secondary | ICD-10-CM

## 2018-06-03 DIAGNOSIS — I480 Paroxysmal atrial fibrillation: Secondary | ICD-10-CM | POA: Diagnosis not present

## 2018-06-23 ENCOUNTER — Ambulatory Visit: Payer: Medicare Other | Admitting: Family Medicine

## 2018-06-28 ENCOUNTER — Other Ambulatory Visit: Payer: Self-pay | Admitting: Family Medicine

## 2018-06-28 DIAGNOSIS — I10 Essential (primary) hypertension: Secondary | ICD-10-CM

## 2018-06-28 DIAGNOSIS — I5042 Chronic combined systolic (congestive) and diastolic (congestive) heart failure: Secondary | ICD-10-CM

## 2018-06-28 NOTE — Telephone Encounter (Signed)
She needs a regular visit, only coming for acute visits

## 2018-06-29 NOTE — Telephone Encounter (Signed)
Spoke with patient and she scheduled appt for this Thursday with Dr Ancil Boozer.

## 2018-06-30 ENCOUNTER — Encounter: Payer: Self-pay | Admitting: Family Medicine

## 2018-06-30 ENCOUNTER — Ambulatory Visit (INDEPENDENT_AMBULATORY_CARE_PROVIDER_SITE_OTHER): Payer: Medicare Other | Admitting: Family Medicine

## 2018-06-30 VITALS — BP 128/72 | HR 64 | Temp 98.3°F | Resp 16 | Ht 64.0 in | Wt 168.1 lb

## 2018-06-30 DIAGNOSIS — Z7901 Long term (current) use of anticoagulants: Secondary | ICD-10-CM | POA: Diagnosis not present

## 2018-06-30 DIAGNOSIS — J410 Simple chronic bronchitis: Secondary | ICD-10-CM

## 2018-06-30 DIAGNOSIS — M109 Gout, unspecified: Secondary | ICD-10-CM | POA: Diagnosis not present

## 2018-06-30 DIAGNOSIS — E8881 Metabolic syndrome: Secondary | ICD-10-CM | POA: Diagnosis not present

## 2018-06-30 DIAGNOSIS — I5042 Chronic combined systolic (congestive) and diastolic (congestive) heart failure: Secondary | ICD-10-CM

## 2018-06-30 DIAGNOSIS — G43109 Migraine with aura, not intractable, without status migrainosus: Secondary | ICD-10-CM

## 2018-06-30 DIAGNOSIS — E559 Vitamin D deficiency, unspecified: Secondary | ICD-10-CM | POA: Diagnosis not present

## 2018-06-30 DIAGNOSIS — I4891 Unspecified atrial fibrillation: Secondary | ICD-10-CM | POA: Diagnosis not present

## 2018-06-30 DIAGNOSIS — E785 Hyperlipidemia, unspecified: Secondary | ICD-10-CM

## 2018-06-30 DIAGNOSIS — I480 Paroxysmal atrial fibrillation: Secondary | ICD-10-CM

## 2018-06-30 DIAGNOSIS — F325 Major depressive disorder, single episode, in full remission: Secondary | ICD-10-CM | POA: Diagnosis not present

## 2018-06-30 DIAGNOSIS — I10 Essential (primary) hypertension: Secondary | ICD-10-CM | POA: Diagnosis not present

## 2018-06-30 MED ORDER — ALLOPURINOL 300 MG PO TABS: 300.0000 mg | ORAL_TABLET | Freq: Every day | ORAL | 1 refills | Status: DC

## 2018-06-30 MED ORDER — METOPROLOL TARTRATE 25 MG PO TABS: 25.0000 mg | ORAL_TABLET | Freq: Two times a day (BID) | ORAL | 1 refills | Status: DC

## 2018-06-30 MED ORDER — TORSEMIDE 20 MG PO TABS: 20.0000 mg | ORAL_TABLET | Freq: Every day | ORAL | 1 refills | Status: DC

## 2018-06-30 MED ORDER — ATORVASTATIN CALCIUM 80 MG PO TABS: 80.0000 mg | ORAL_TABLET | Freq: Every day | ORAL | 1 refills | Status: DC

## 2018-06-30 MED ORDER — POTASSIUM CHLORIDE CRYS ER 20 MEQ PO TBCR: 20.0000 meq | EXTENDED_RELEASE_TABLET | Freq: Every day | ORAL | 1 refills | Status: DC

## 2018-06-30 NOTE — Progress Notes (Signed)
Name: [name]   MRN: [id number]    DOB: 10-30-1940   Date:06/30/2018       Progress Note  Subjective  Chief Complaint  Chief Complaint  Patient presents with  . Follow-up    HPI  Chronic combined CHF: she is on Demadex and potassium, off digoxin ( she states only taking it prn)  and beta-blocker . No chest , no SOB recently, she denies orthopnea. She is not on ACE because of cough, she was on Losartan for a period of time but bp dropped so cardiologist.   HTN: taking metoprolol, bp much better controlled since aortic valve replacement. No chest pain , she states no SOB at this time.   Hyperlipidemia:she had stopped taking Atorvastatin months ago and last LDL was over 180. She  not on Zetia because of cost of medication. She states she is willing to try taking atorvastatin daily.   Insomnia: she takes Trazodone prn, most nights she sleeps without medication.   Gout: no recent episodes, taking allopurinol daily.   Dysmetabolic Syndrome: she is not very compliant with diet. She denies polyphagia, polyuria or polydipsia Last hgbA1C was normal   Afib: rate controlled, no fluttering sensation, taking betablocker and off digoxin, also on coumadin and goes to coumadin clinic/Dr. Callwood.She is on lopressor.   Chronic bronchitis : she is taking Spiriva again but only prn for wheezing. She states she has a productive cough in am's but denies SOB.   Depression: she has a long history of depression, used to take SSRI's, symptoms now are intermittent and has been in remission for months.  She denies suicidal thoughts or ideation. She is no longer eating all the time, lost weight and it has helped her mood   Migraine headaches: she has occasional episodes of aura, described as scotomas and recently not followed by a headache. Usually triggered by not sleeping well or skipping a meal, but improves with rest or when she eats food. Unchanged.   Patient Active Problem List   Diagnosis Date Noted  . On Coumadin for atrial fibrillation ([place]) 06/30/2018  . Chronic anticoagulation 08/27/2017  . History of right breast cancer 08/27/2017  . Chronic obstructive pulmonary disease ([place]) 07/04/2015  . Controlled gout 06/06/2015  . Anxiety 02/09/2015  . Synovial cyst of popliteal space 02/09/2015  . Benign hypertension 02/09/2015  . Blood type A+ 02/09/2015  . Arterial branch occlusion of retina 02/09/2015  . Chronic constipation 02/09/2015  . Insomnia, persistent 02/09/2015  . Chronic combined systolic and diastolic CHF, NYHA class 1 ([place]) 02/09/2015  . Dyslipidemia 02/09/2015  . Atrial fibrillation ([place]) 02/09/2015  . Arthritis urica 02/09/2015  . Hearing loss 02/09/2015  . History of open heart surgery 02/09/2015  . Chronic hoarseness 02/09/2015  . Cardiomegaly 02/09/2015  . Dysmetabolic syndrome 36/64/4034  . Migraine with aura and without status migrainosus, not intractable 02/09/2015  . Adult BMI 30+ 02/09/2015  . Hypertensive pulmonary vascular disease ([place]) 02/09/2015  . Allergic rhinitis, seasonal 02/09/2015  . Vitamin D deficiency 02/09/2015  . History of mitral valve repair 10/01/2012  . History of aortic valve replacement 10/01/2012  . Congestive heart failure ([place]) 10/01/2012    Past Surgical History:  Procedure Laterality Date  . ABDOMINAL HYSTERECTOMY  1996  . BREAST BIOPSY Right 1998   neg  . BREAST BIOPSY Right 2007   neg  . BREAST BIOPSY Right 1992   positive  . BREAST EXCISIONAL BIOPSY Left 2000   neg  . CARDIAC VALVE REPLACEMENT  2014  . CATARACT EXTRACTION W/PHACO Left 03/30/2018   Procedure: CATARACT EXTRACTION PHACO AND INTRAOCULAR LENS PLACEMENT (IOC);  Surgeon: Birder Robson, MD;  Location: [facility] ORS;  Service: Ophthalmology;  Laterality: Left;  [place] 00:31.9 AP% 12.1 CDE 3.87 Fluid Pack lot # [id number]  . FRACTURE SURGERY Left    ARM/ LEG    Family History  Problem Relation Age of Onset  . Cancer Mother   . Diabetes  Mother   . Hypertension Mother   . Breast cancer Mother 81  . Hypertension Father   . Heart failure Father   . Cancer Brother        bladder  . Cancer Sister        breast  . Breast cancer Sister 11  . Hypertension Son   . Hypertension Daughter   . Breast cancer Sister 21    Social History   Socioeconomic History  . Marital status: Widowed    Spouse name: Not on file  . Number of children: 2  . Years of education: Not on file  . Highest education level: 12th grade  Occupational History  . Occupation: cafeteria   Social Needs  . Financial resource strain: Not hard at all  . Food insecurity:    Worry: Never true    Inability: Never true  . Transportation needs:    Medical: No    Non-medical: No  Tobacco Use  . Smoking status: Former Smoker    Last attempt to quit: 09/09/2007    Years since quitting: 10.8  . Smokeless tobacco: Never Used  Substance and Sexual Activity  . Alcohol use: No    Alcohol/week: 0.0 standard drinks  . Drug use: No  . Sexual activity: Not Currently  Lifestyle  . Physical activity:    Days per week: 0 days    Minutes per session: 0 min  . Stress: Only a little  Relationships  . Social connections:    Talks on phone: More than three times a week    Gets together: More than three times a week    Attends religious service: More than 4 times per year    Active member of club or organization: Yes    Attends meetings of clubs or organizations: More than 4 times per year    Relationship status: Widowed  . Intimate partner violence:    Fear of current or ex partner: No    Emotionally abused: No    Physically abused: No    Forced sexual activity: No  Other Topics Concern  . Not on file  Social History Narrative  . Not on file     Current Outpatient Medications:  .  acetaminophen (TYLENOL) 500 MG tablet, Take 1,000-1,500 mg by mouth every 6 (six) hours as needed for mild pain., Disp: , Rfl:  .  allopurinol (ZYLOPRIM) 300 MG tablet, Take 1  tablet (300 mg total) by mouth daily., Disp: 90 tablet, Rfl: 1 .  aspirin EC 81 MG tablet, Take 1 tablet by mouth daily., Disp: , Rfl:  .  atorvastatin (LIPITOR) 80 MG tablet, Take 1 tablet (80 mg total) by mouth at bedtime., Disp: 90 tablet, Rfl: 1 .  Calcium Carbonate-Vitamin D 600-400 MG-UNIT tablet, Take 1 tablet by mouth 2 (two) times daily., Disp: , Rfl:  .  digoxin (LANOXIN) 0.125 MG tablet, Take 1 tablet by mouth daily as needed (HEART PALPITATIONS). , Disp: , Rfl:  .  meclizine (ANTIVERT) 12.5 MG tablet, Take 1 tablet (12.5 mg total) by  mouth 3 (three) times daily as needed for dizziness., Disp: 20 tablet, Rfl: 0 .  metoprolol tartrate (LOPRESSOR) 25 MG tablet, Take 1 tablet (25 mg total) by mouth 2 (two) times daily., Disp: 180 tablet, Rfl: 1 .  ondansetron (ZOFRAN) 4 MG tablet, Take 1 tablet (4 mg total) by mouth every 8 (eight) hours as needed for nausea or vomiting., Disp: 20 tablet, Rfl: 0 .  polyethylene glycol (MIRALAX / GLYCOLAX) packet, Take 17 g by mouth daily as needed for moderate constipation. , Disp: , Rfl:  .  potassium chloride SA (KLOR-CON M20) 20 MEQ tablet, Take 1 tablet (20 mEq total) by mouth daily., Disp: 90 tablet, Rfl: 1 .  tiotropium (SPIRIVA HANDIHALER) 18 MCG inhalation capsule, INHALE 1 CASPULE VIA HANDIHALER ONCE A DAY AT THE SAME TIME EVERY DAY (Patient taking differently: Place 18 mcg into inhaler and inhale daily as needed (WHEEZING). ), Disp: 90 capsule, Rfl: 1 .  torsemide (DEMADEX) 20 MG tablet, Take 1 tablet (20 mg total) by mouth daily., Disp: 90 tablet, Rfl: 1 .  traZODone (DESYREL) 50 MG tablet, TAKE 1 TABLET BY MOUTH EVERYDAY AT BEDTIME, Disp: 90 tablet, Rfl: 1 .  warfarin (COUMADIN) 5 MG tablet, Take 5 mg by mouth daily., Disp: , Rfl:   Allergies  Allergen Reactions  . Ace Inhibitors Cough and Other (See Comments)  . Amoxicillin-Pot Clavulanate Nausea And Vomiting  . Codeine Nausea And Vomiting  . Penicillins Nausea And Vomiting and Other (See  Comments)    Has patient had a PCN reaction causing immediate rash, facial/tongue/throat swelling, SOB or lightheadedness with hypotension: No Has patient had a PCN reaction causing severe rash involving mucus membranes or skin necrosis: No Has patient had a PCN reaction that required hospitalization No Has patient had a PCN reaction occurring within the last 10 years: No If all of the above answers are "NO", then may proceed with Cephalosporin use.  . Rosuvastatin Other (See Comments)    Reaction:  Joint pain   . Tetanus-Diphtheria Toxoids Td Swelling    I personally reviewed active problem list, medication list, allergies, family history, social history, health maintenance with the patient/caregiver today.   ROS  Constitutional: Negative for fever, positive for  weight change.  Respiratory: Negative for cough and shortness of breath.   Cardiovascular: Negative for chest pain or palpitations.  Gastrointestinal: Negative for abdominal pain, no bowel changes.  Musculoskeletal: Negative for gait problem , she has intermittent left  joint swelling.  Skin: Negative for rash.  Neurological: Negative for dizziness, positive for intermittent  headache.  No other specific complaints in a complete review of systems (except as listed in HPI above).  Objective  Vitals:   06/30/18 0749  BP: 128/72  Pulse: 64  Resp: 16  Temp: 98.3 F (36.8 C)  TempSrc: Oral  SpO2: 98%  Weight: 168 lb 1.6 oz (76.2 kg)  Height: 5\' 4"  (1.626 m)    Body mass index is 28.85 kg/m.  Physical Exam  Constitutional: Patient appears well-developed and well-nourished. Overweight.  No distress.  HEENT: head atraumatic, normocephalic, pupils equal and reactive to light, neck supple, throat within normal limits Cardiovascular:  Bradycardia during auscultation , irregular rhythm and normal heart sounds.  2/6 murmur heard. No BLE edema. Pulmonary/Chest: Effort normal and breath sounds normal. No respiratory  distress. Abdominal: Soft.  There is no tenderness. Psychiatric: Patient has a normal mood and affect. behavior is normal. Judgment and thought content normal.  PHQ2/9: Depression screen [facility] 2/9 05/13/2018  02/18/2018 02/10/2018 05/13/2017 08/12/2016  Decreased Interest 0 0 0 0 0  Down, Depressed, Hopeless 0 0 0 0 0  PHQ - 2 Score 0 0 0 0 0  Altered sleeping 0 3 - - -  Tired, decreased energy 0 1 - - -  Change in appetite 0 3 - - -  Feeling bad or failure about yourself  0 3 - - -  Trouble concentrating 0 0 - - -  Moving slowly or fidgety/restless 0 0 - - -  Suicidal thoughts 0 0 - - -  PHQ-9 Score 0 - - - -  Difficult doing work/chores Not difficult at all Somewhat difficult - - -     Fall Risk: Fall Risk  05/13/2018 02/18/2018 02/10/2018 05/13/2017 08/12/2016  Falls in the past year? Yes Yes Yes No No  Number falls in past yr: 1 1 1  - -  Injury with Fall? Yes Yes Yes - -  Comment - - Broke her left arm and leg - -      Assessment & Plan  1. Paroxysmal atrial fibrillation (HCC)  - metoprolol tartrate (LOPRESSOR) 25 MG tablet; Take 1 tablet (25 mg total) by mouth 2 (two) times daily.  Dispense: 180 tablet; Refill: 1  2. Simple chronic bronchitis (HCC)  Advised Spiriva daily   3. Benign hypertension  - torsemide (DEMADEX) 20 MG tablet; Take 1 tablet (20 mg total) by mouth daily.  Dispense: 90 tablet; Refill: 1  4. Major depression in remission ([place])   5. Migraine with aura and without status migrainosus, not intractable   6. On Coumadin for atrial fibrillation ([place])  Continue follow up with Dr. Clayborn Bigness   7. Chronic combined systolic and diastolic CHF, NYHA class 1 (HCC)  - potassium chloride SA (KLOR-CON M20) 20 MEQ tablet; Take 1 tablet (20 mEq total) by mouth daily.  Dispense: 90 tablet; Refill: 1 - torsemide (DEMADEX) 20 MG tablet; Take 1 tablet (20 mg total) by mouth daily.  Dispense: 90 tablet; Refill: 1  8. Dysmetabolic syndrome  Last [id number] at goal  9. Vitamin D  deficiency  Advised to take otc vitamin D 2000 units , advised to have bone density test done  10. Dyslipidemia  - atorvastatin (LIPITOR) 80 MG tablet; Take 1 tablet (80 mg total) by mouth at bedtime.  Dispense: 90 tablet; Refill: 1  11. Controlled gout  - allopurinol (ZYLOPRIM) 300 MG tablet; Take 1 tablet (300 mg total) by mouth daily.  Dispense: 90 tablet; Refill: 1

## 2018-07-01 ENCOUNTER — Ambulatory Visit: Payer: Medicare Other | Admitting: Family Medicine

## 2018-07-01 DIAGNOSIS — I480 Paroxysmal atrial fibrillation: Secondary | ICD-10-CM | POA: Diagnosis not present

## 2018-07-09 DIAGNOSIS — S52572D Other intraarticular fracture of lower end of left radius, subsequent encounter for closed fracture with routine healing: Secondary | ICD-10-CM | POA: Diagnosis not present

## 2018-07-09 DIAGNOSIS — M25532 Pain in left wrist: Secondary | ICD-10-CM | POA: Diagnosis not present

## 2018-07-26 DIAGNOSIS — I480 Paroxysmal atrial fibrillation: Secondary | ICD-10-CM | POA: Diagnosis not present

## 2018-08-12 ENCOUNTER — Telehealth: Payer: Self-pay

## 2018-08-12 ENCOUNTER — Ambulatory Visit (INDEPENDENT_AMBULATORY_CARE_PROVIDER_SITE_OTHER): Payer: Medicare Other | Admitting: Nurse Practitioner

## 2018-08-12 ENCOUNTER — Ambulatory Visit: Payer: Medicare Other | Admitting: Nurse Practitioner

## 2018-08-12 ENCOUNTER — Encounter: Payer: Self-pay | Admitting: Nurse Practitioner

## 2018-08-12 VITALS — BP 130/80 | HR 61 | Temp 98.2°F | Resp 16 | Ht 64.0 in | Wt 172.7 lb

## 2018-08-12 DIAGNOSIS — R059 Cough, unspecified: Secondary | ICD-10-CM

## 2018-08-12 DIAGNOSIS — R0981 Nasal congestion: Secondary | ICD-10-CM | POA: Diagnosis not present

## 2018-08-12 DIAGNOSIS — J029 Acute pharyngitis, unspecified: Secondary | ICD-10-CM | POA: Diagnosis not present

## 2018-08-12 DIAGNOSIS — R05 Cough: Secondary | ICD-10-CM

## 2018-08-12 MED ORDER — FLUTICASONE PROPIONATE 50 MCG/ACT NA SUSP: 2.0000 | Freq: Every day | NASAL | 6 refills | Status: DC

## 2018-08-12 MED ORDER — MAGIC MOUTHWASH W/LIDOCAINE: ORAL | 0 refills | Status: DC

## 2018-08-12 MED ORDER — BENZONATATE 200 MG PO CAPS: 200.0000 mg | ORAL_CAPSULE | Freq: Two times a day (BID) | ORAL | 0 refills | Status: DC | PRN

## 2018-08-12 NOTE — Progress Notes (Signed)
Name: [name]   MRN: [id number]    DOB: 1941/05/16   Date:08/12/2018       Progress Note  Subjective  Chief Complaint  Chief Complaint  Patient presents with  . Sinusitis    yellow drainage, runny nose, productive cough x 3 days.    HPI  Patient endorses scratchy throat started tuesday then had rhinorrhea, productive cough x 3 days- green yellow mucous.   Denies fevers, chills, sinus pain, shortness of breath chest pain.    Patient Active Problem List   Diagnosis Date Noted  . On Coumadin for atrial fibrillation ([place]) 06/30/2018  . Chronic anticoagulation 08/27/2017  . History of right breast cancer 08/27/2017  . Chronic obstructive pulmonary disease ([place]) 07/04/2015  . Controlled gout 06/06/2015  . Anxiety 02/09/2015  . Synovial cyst of popliteal space 02/09/2015  . Benign hypertension 02/09/2015  . Blood type A+ 02/09/2015  . Arterial branch occlusion of retina 02/09/2015  . Chronic constipation 02/09/2015  . Insomnia, persistent 02/09/2015  . Chronic combined systolic and diastolic CHF, NYHA class 1 ([place]) 02/09/2015  . Dyslipidemia 02/09/2015  . Atrial fibrillation ([place]) 02/09/2015  . Arthritis urica 02/09/2015  . Hearing loss 02/09/2015  . History of open heart surgery 02/09/2015  . Chronic hoarseness 02/09/2015  . Cardiomegaly 02/09/2015  . Dysmetabolic syndrome 15/17/6160  . Migraine with aura and without status migrainosus, not intractable 02/09/2015  . Adult BMI 30+ 02/09/2015  . Hypertensive pulmonary vascular disease ([place]) 02/09/2015  . Allergic rhinitis, seasonal 02/09/2015  . Vitamin D deficiency 02/09/2015  . History of mitral valve repair 10/01/2012  . History of aortic valve replacement 10/01/2012  . Congestive heart failure ([place]) 10/01/2012    Past Medical History:  Diagnosis Date  . Atrial fibrillation ([place])   . Breast cancer ([place]) 1992   positive, radiation  . CHF (congestive heart failure) ([place])   . COPD (chronic obstructive  pulmonary disease) ([place])   . Dysrhythmia   . Gout   . History of aortic valve replacement   . Hyperlipidemia   . Hypertension     Past Surgical History:  Procedure Laterality Date  . ABDOMINAL HYSTERECTOMY  1996  . BREAST BIOPSY Right 1998   neg  . BREAST BIOPSY Right 2007   neg  . BREAST BIOPSY Right 1992   positive  . BREAST EXCISIONAL BIOPSY Left 2000   neg  . CARDIAC VALVE REPLACEMENT  2014  . CATARACT EXTRACTION W/PHACO Left 03/30/2018   Procedure: CATARACT EXTRACTION PHACO AND INTRAOCULAR LENS PLACEMENT (IOC);  Surgeon: Birder Robson, MD;  Location: [facility] ORS;  Service: Ophthalmology;  Laterality: Left;  [place] 00:31.9 AP% 12.1 CDE 3.87 Fluid Pack lot # [id number]  . FRACTURE SURGERY Left    ARM/ LEG    Social History   Tobacco Use  . Smoking status: Former Smoker    Last attempt to quit: 09/09/2007    Years since quitting: 10.9  . Smokeless tobacco: Never Used  Substance Use Topics  . Alcohol use: No    Alcohol/week: 0.0 standard drinks     Current Outpatient Medications:  .  acetaminophen (TYLENOL) 500 MG tablet, Take 1,000-1,500 mg by mouth every 6 (six) hours as needed for mild pain., Disp: , Rfl:  .  allopurinol (ZYLOPRIM) 300 MG tablet, Take 1 tablet (300 mg total) by mouth daily., Disp: 90 tablet, Rfl: 1 .  aspirin EC 81 MG tablet, Take 1 tablet by mouth daily., Disp: , Rfl:  .  atorvastatin (LIPITOR) 80 MG  tablet, Take 1 tablet (80 mg total) by mouth at bedtime., Disp: 90 tablet, Rfl: 1 .  digoxin (LANOXIN) 0.125 MG tablet, Take 1 tablet by mouth daily as needed (HEART PALPITATIONS). , Disp: , Rfl:  .  meclizine (ANTIVERT) 12.5 MG tablet, Take 1 tablet (12.5 mg total) by mouth 3 (three) times daily as needed for dizziness., Disp: 20 tablet, Rfl: 0 .  metoprolol tartrate (LOPRESSOR) 25 MG tablet, Take 1 tablet (25 mg total) by mouth 2 (two) times daily., Disp: 180 tablet, Rfl: 1 .  ondansetron (ZOFRAN) 4 MG tablet, Take 1 tablet (4 mg total) by mouth every 8  (eight) hours as needed for nausea or vomiting., Disp: 20 tablet, Rfl: 0 .  polyethylene glycol (MIRALAX / GLYCOLAX) packet, Take 17 g by mouth daily as needed for moderate constipation. , Disp: , Rfl:  .  tiotropium (SPIRIVA HANDIHALER) 18 MCG inhalation capsule, INHALE 1 CASPULE VIA HANDIHALER ONCE A DAY AT THE SAME TIME EVERY DAY (Patient taking differently: Place 18 mcg into inhaler and inhale daily as needed (WHEEZING). ), Disp: 90 capsule, Rfl: 1 .  torsemide (DEMADEX) 20 MG tablet, Take 1 tablet (20 mg total) by mouth daily., Disp: 90 tablet, Rfl: 1 .  traZODone (DESYREL) 50 MG tablet, TAKE 1 TABLET BY MOUTH EVERYDAY AT BEDTIME, Disp: 90 tablet, Rfl: 1 .  warfarin (COUMADIN) 5 MG tablet, Take 5 mg by mouth daily., Disp: , Rfl:  .  benzonatate (TESSALON) 200 MG capsule, Take 1 capsule (200 mg total) by mouth 2 (two) times daily as needed for cough., Disp: 20 capsule, Rfl: 0 .  Calcium Carbonate-Vitamin D 600-400 MG-UNIT tablet, Take 1 tablet by mouth 2 (two) times daily., Disp: , Rfl:  .  fluticasone (FLONASE) 50 MCG/ACT nasal spray, Place 2 sprays into both nostrils daily., Disp: 16 g, Rfl: 6 .  magic mouthwash w/lidocaine SOLN, Swish and spit, Disp: 50 mL, Rfl: 0 .  potassium chloride SA (KLOR-CON M20) 20 MEQ tablet, Take 1 tablet (20 mEq total) by mouth daily. (Patient not taking: Reported on 08/12/2018), Disp: 90 tablet, Rfl: 1  Allergies  Allergen Reactions  . Ace Inhibitors Cough and Other (See Comments)  . Amoxicillin-Pot Clavulanate Nausea And Vomiting  . Codeine Nausea And Vomiting  . Penicillins Nausea And Vomiting and Other (See Comments)    Has patient had a PCN reaction causing immediate rash, facial/tongue/throat swelling, SOB or lightheadedness with hypotension: No Has patient had a PCN reaction causing severe rash involving mucus membranes or skin necrosis: No Has patient had a PCN reaction that required hospitalization No Has patient had a PCN reaction occurring within the  last 10 years: No If all of the above answers are "NO", then may proceed with Cephalosporin use.  . Rosuvastatin Other (See Comments)    Reaction:  Joint pain   . Tetanus-Diphtheria Toxoids Td Swelling    ROS   No other specific complaints in a complete review of systems (except as listed in HPI above).  Objective  Vitals:   08/12/18 1000  BP: 130/80  Pulse: 61  Resp: 16  Temp: 98.2 F (36.8 C)  TempSrc: Oral  SpO2: 98%  Weight: 172 lb 11.2 oz (78.3 kg)  Height: 5\' 4"  (1.626 m)    Body mass index is 29.64 kg/m.  Nursing Note and Vital Signs reviewed.  Physical Exam  HENT:  Head: Normocephalic and atraumatic.  Right Ear: Hearing, tympanic membrane, external ear and ear canal normal.  Left Ear: Hearing, tympanic membrane,  external ear and ear canal normal.  Nose: Mucosal edema and rhinorrhea present. Right sinus exhibits no maxillary sinus tenderness and no frontal sinus tenderness. Left sinus exhibits no maxillary sinus tenderness and no frontal sinus tenderness.  Mouth/Throat: Uvula is midline, oropharynx is clear and moist and mucous membranes are normal. No oropharyngeal exudate.  Eyes: Conjunctivae are normal. Right eye exhibits no discharge. Left eye exhibits no discharge.  Neck: Normal range of motion.  Cardiovascular: Normal rate and regular rhythm.  Pulmonary/Chest: Effort normal and breath sounds normal.  Lymphadenopathy:    She has no cervical adenopathy.  Neurological: She is alert.  Skin: Skin is warm and dry. No rash noted.  Psychiatric: Judgment normal.     No results found for this or any previous visit (from the past 48 hour(s)).  Assessment & Plan  1. Cough - benzonatate (TESSALON) 200 MG capsule; Take 1 capsule (200 mg total) by mouth 2 (two) times daily as needed for cough.  Dispense: 20 capsule; Refill: 0  2. Sore throat - magic mouthwash w/lidocaine SOLN; Swish and spit  Dispense: 50 mL; Refill: 0  3. Nasal congestion - fluticasone  (FLONASE) 50 MCG/ACT nasal spray; Place 2 sprays into both nostrils daily.  Dispense: 16 g; Refill: 6   If not improving in 2-3 days or any significant worsening please contact ASAP. Discussed typical course of viral illness and signs and symptoms of bacterial or secondary infection.    -Reviewed Health Maintenance: has cologuard at home explained how to use it.

## 2018-08-12 NOTE — Telephone Encounter (Signed)
Equal parts of all ingredients.

## 2018-08-12 NOTE — Patient Instructions (Addendum)
-   Drink plenty of water, honey lemon tea, magic mouth wash- swish and spit for sore throat, tessalon perls for cough as needed, flonase for nasal congestion . Please contact us if not improving in 3-4 days.    You likely have a viral upper respiratory infection (URI). Antibiotics will not reduce the number of days you are ill or prevent you from getting bacterial rhinosinusitis. A URI can take up to 14 days to resolve, but typically last between 7-11 days. Your body is so smart and strong that it will be fighting this illness off for you but it is important that you drink plenty of fluids, rest. Cover your nose/mouth when you cough or sneeze and wash your hands well and often. Here are some helpful things you can use or pick up over the counter from the pharmacy to help with your symptoms:   For Fever/Pain: Acetaminophen every 6 hours as needed (maximum of 3000mg  a day). If you are still uncomfortable you can add ibuprofen OR naproxen  For coughing: try dextromethorphan for a cough suppressant, and/or a cool mist humidifier, lozenges  For sore throat: saline gargles, honey herbal tea, lozenges, throat spray  To dry out your nose: try an antihistamine like loratadine (non-sedating) or diphenhydramine (sedating) or others To relieve a stuffy nose: try an oral decongestant  Like pseudoephedrine if you are under the age of 84 and do not have high blood pressure, neti pot To make blowing your nose easier: guaifenesin

## 2018-08-12 NOTE — Telephone Encounter (Signed)
Copied from [place] [id number]. Topic: General - Other >> Aug 12, 2018 11:42 AM Janace Aris A wrote: Reason for CRM: [facility] called in, they want to know how much lidocaine to add to the majic mouth wash that was prescribed by the provider. They would like a call back regarding this.

## 2018-08-13 NOTE — Telephone Encounter (Signed)
Okay- 120 mls of mouth wash and adding 30 mls of Lidocaine.

## 2018-08-13 NOTE — Telephone Encounter (Signed)
Pharmacy notified.

## 2018-08-13 NOTE — Telephone Encounter (Signed)
Pharmacy was called. No equal parts of all ingredients. All ingredients come in different forms. There standard [facility] Magic Mouthwash does not contain Lidocaine, so they need to know how much you would like to add. Suggested that you do 120 mls of mouth wash and adding 30 mls of Lidocaine.

## 2018-08-18 ENCOUNTER — Telehealth: Payer: Self-pay | Admitting: Family Medicine

## 2018-08-18 ENCOUNTER — Ambulatory Visit: Payer: Self-pay | Admitting: *Deleted

## 2018-08-18 ENCOUNTER — Ambulatory Visit (INDEPENDENT_AMBULATORY_CARE_PROVIDER_SITE_OTHER): Payer: Medicare Other | Admitting: Nurse Practitioner

## 2018-08-18 ENCOUNTER — Encounter: Payer: Self-pay | Admitting: Nurse Practitioner

## 2018-08-18 DIAGNOSIS — R05 Cough: Secondary | ICD-10-CM | POA: Diagnosis not present

## 2018-08-18 DIAGNOSIS — R059 Cough, unspecified: Secondary | ICD-10-CM

## 2018-08-18 MED ORDER — BENZONATATE 200 MG PO CAPS: 200.0000 mg | ORAL_CAPSULE | Freq: Two times a day (BID) | ORAL | 0 refills | Status: DC | PRN

## 2018-08-18 MED ORDER — AZITHROMYCIN 250 MG PO TABS: ORAL_TABLET | ORAL | 0 refills | Status: DC

## 2018-08-18 NOTE — Progress Notes (Signed)
Name: [name]   MRN: [id number]    DOB: 12-Jan-1941   Date:08/18/2018       Progress Note  Subjective  Chief Complaint  Chief Complaint  Patient presents with  . Cough    HPI  Patient presents for over a week and half of URI symptoms; was seen last week for same and given tessalon perls, magic mouthwash, and flonase. Cough has worsened and states was sent home from work yesterday.   Patient Active Problem List   Diagnosis Date Noted  . On Coumadin for atrial fibrillation ([place]) 06/30/2018  . Chronic anticoagulation 08/27/2017  . History of right breast cancer 08/27/2017  . Chronic obstructive pulmonary disease ([place]) 07/04/2015  . Controlled gout 06/06/2015  . Anxiety 02/09/2015  . Synovial cyst of popliteal space 02/09/2015  . Benign hypertension 02/09/2015  . Blood type A+ 02/09/2015  . Arterial branch occlusion of retina 02/09/2015  . Chronic constipation 02/09/2015  . Insomnia, persistent 02/09/2015  . Chronic combined systolic and diastolic CHF, NYHA class 1 ([place]) 02/09/2015  . Dyslipidemia 02/09/2015  . Atrial fibrillation ([place]) 02/09/2015  . Arthritis urica 02/09/2015  . Hearing loss 02/09/2015  . History of open heart surgery 02/09/2015  . Chronic hoarseness 02/09/2015  . Cardiomegaly 02/09/2015  . Dysmetabolic syndrome 86/76/1950  . Migraine with aura and without status migrainosus, not intractable 02/09/2015  . Adult BMI 30+ 02/09/2015  . Hypertensive pulmonary vascular disease ([place]) 02/09/2015  . Allergic rhinitis, seasonal 02/09/2015  . Vitamin D deficiency 02/09/2015  . History of mitral valve repair 10/01/2012  . History of aortic valve replacement 10/01/2012  . Congestive heart failure ([place]) 10/01/2012    Past Medical History:  Diagnosis Date  . Atrial fibrillation ([place])   . Breast cancer ([place]) 1992   positive, radiation  . CHF (congestive heart failure) ([place])   . COPD (chronic obstructive pulmonary disease) ([place])   . Dysrhythmia   . Gout    . History of aortic valve replacement   . Hyperlipidemia   . Hypertension     Past Surgical History:  Procedure Laterality Date  . ABDOMINAL HYSTERECTOMY  1996  . BREAST BIOPSY Right 1998   neg  . BREAST BIOPSY Right 2007   neg  . BREAST BIOPSY Right 1992   positive  . BREAST EXCISIONAL BIOPSY Left 2000   neg  . CARDIAC VALVE REPLACEMENT  2014  . CATARACT EXTRACTION W/PHACO Left 03/30/2018   Procedure: CATARACT EXTRACTION PHACO AND INTRAOCULAR LENS PLACEMENT (IOC);  Surgeon: Birder Robson, MD;  Location: [facility] ORS;  Service: Ophthalmology;  Laterality: Left;  [place] 00:31.9 AP% 12.1 CDE 3.87 Fluid Pack lot # [id number]  . FRACTURE SURGERY Left    ARM/ LEG    Social History   Tobacco Use  . Smoking status: Former Smoker    Last attempt to quit: 09/09/2007    Years since quitting: 10.9  . Smokeless tobacco: Never Used  Substance Use Topics  . Alcohol use: No    Alcohol/week: 0.0 standard drinks     Current Outpatient Medications:  .  acetaminophen (TYLENOL) 500 MG tablet, Take 1,000-1,500 mg by mouth every 6 (six) hours as needed for mild pain., Disp: , Rfl:  .  allopurinol (ZYLOPRIM) 300 MG tablet, Take 1 tablet (300 mg total) by mouth daily., Disp: 90 tablet, Rfl: 1 .  aspirin EC 81 MG tablet, Take 1 tablet by mouth daily., Disp: , Rfl:  .  atorvastatin (LIPITOR) 80 MG tablet, Take 1 tablet (80 mg  total) by mouth at bedtime., Disp: 90 tablet, Rfl: 1 .  azithromycin (ZITHROMAX) 250 MG tablet, 2 tabs today and 1 tab everyday for the next 4 days, Disp: 6 tablet, Rfl: 0 .  benzonatate (TESSALON) 200 MG capsule, Take 1 capsule (200 mg total) by mouth 2 (two) times daily as needed for cough., Disp: 20 capsule, Rfl: 0 .  Calcium Carbonate-Vitamin D 600-400 MG-UNIT tablet, Take 1 tablet by mouth 2 (two) times daily., Disp: , Rfl:  .  digoxin (LANOXIN) 0.125 MG tablet, Take 1 tablet by mouth daily as needed (HEART PALPITATIONS). , Disp: , Rfl:  .  fluticasone (FLONASE) 50 MCG/ACT  nasal spray, Place 2 sprays into both nostrils daily., Disp: 16 g, Rfl: 6 .  magic mouthwash w/lidocaine SOLN, Swish and spit, Disp: 50 mL, Rfl: 0 .  meclizine (ANTIVERT) 12.5 MG tablet, Take 1 tablet (12.5 mg total) by mouth 3 (three) times daily as needed for dizziness., Disp: 20 tablet, Rfl: 0 .  metoprolol tartrate (LOPRESSOR) 25 MG tablet, Take 1 tablet (25 mg total) by mouth 2 (two) times daily., Disp: 180 tablet, Rfl: 1 .  ondansetron (ZOFRAN) 4 MG tablet, Take 1 tablet (4 mg total) by mouth every 8 (eight) hours as needed for nausea or vomiting., Disp: 20 tablet, Rfl: 0 .  polyethylene glycol (MIRALAX / GLYCOLAX) packet, Take 17 g by mouth daily as needed for moderate constipation. , Disp: , Rfl:  .  potassium chloride SA (KLOR-CON M20) 20 MEQ tablet, Take 1 tablet (20 mEq total) by mouth daily. (Patient not taking: Reported on 08/12/2018), Disp: 90 tablet, Rfl: 1 .  tiotropium (SPIRIVA HANDIHALER) 18 MCG inhalation capsule, INHALE 1 CASPULE VIA HANDIHALER ONCE A DAY AT THE SAME TIME EVERY DAY (Patient taking differently: Place 18 mcg into inhaler and inhale daily as needed (WHEEZING). ), Disp: 90 capsule, Rfl: 1 .  torsemide (DEMADEX) 20 MG tablet, Take 1 tablet (20 mg total) by mouth daily., Disp: 90 tablet, Rfl: 1 .  traZODone (DESYREL) 50 MG tablet, TAKE 1 TABLET BY MOUTH EVERYDAY AT BEDTIME, Disp: 90 tablet, Rfl: 1 .  warfarin (COUMADIN) 5 MG tablet, Take 5 mg by mouth daily., Disp: , Rfl:   Allergies  Allergen Reactions  . Ace Inhibitors Cough and Other (See Comments)  . Amoxicillin-Pot Clavulanate Nausea And Vomiting  . Codeine Nausea And Vomiting  . Penicillins Nausea And Vomiting and Other (See Comments)    Has patient had a PCN reaction causing immediate rash, facial/tongue/throat swelling, SOB or lightheadedness with hypotension: No Has patient had a PCN reaction causing severe rash involving mucus membranes or skin necrosis: No Has patient had a PCN reaction that required  hospitalization No Has patient had a PCN reaction occurring within the last 10 years: No If all of the above answers are "NO", then may proceed with Cephalosporin use.  . Rosuvastatin Other (See Comments)    Reaction:  Joint pain   . Tetanus-Diphtheria Toxoids Td Swelling    ROS   No other specific complaints in a complete review of systems (except as listed in HPI above).  Objective  Vitals:   08/18/18 1111  BP: 136/66  Pulse: 67  Resp: 16  Temp: 98 F (36.7 C)  TempSrc: Oral  SpO2: 97%  Weight: 176 lb 6.4 oz (80 kg)  Height: 5\' 4"  (1.626 m)    Body mass index is 30.28 kg/m.  Nursing Note and Vital Signs reviewed.  Physical Exam  Constitutional: She is oriented to person,  place, and time. She appears well-developed and well-nourished. She is cooperative.  HENT:  Head: Normocephalic and atraumatic.  Right Ear: Hearing normal.  Left Ear: Hearing normal.  Mouth/Throat: Oropharynx is clear and moist and mucous membranes are normal.  Eyes: Conjunctivae are normal.  Cardiovascular: Normal rate, regular rhythm and normal heart sounds.  Pulmonary/Chest: Effort normal and breath sounds normal.  Abdominal: Normal appearance.  Musculoskeletal: Normal range of motion.  Neurological: She is alert and oriented to person, place, and time.  Skin: Skin is dry.  Psychiatric: She has a normal mood and affect. Her speech is normal and behavior is normal. Judgment and thought content normal.     No results found for this or any previous visit (from the past 48 hour(s)).  Assessment & Plan  1. Cough Discussed ER precautions, call if unimproved will order chest xray - benzonatate (TESSALON) 200 MG capsule; Take 1 capsule (200 mg total) by mouth 2 (two) times daily as needed for cough.  Dispense: 20 capsule; Refill: 0 - azithromycin (ZITHROMAX) 250 MG tablet; 2 tabs today and 1 tab everyday for the next 4 days  Dispense: 6 tablet; Refill: 0

## 2018-08-18 NOTE — Telephone Encounter (Signed)
I called [facility] and left a message on their voicemail regarding the 2 medications that were faxed earlier today.

## 2018-08-18 NOTE — Telephone Encounter (Signed)
Pharmacy notified. Patient had already picked up rx.

## 2018-08-18 NOTE — Telephone Encounter (Signed)
Copied from [place] [id number]. Topic: Quick Communication - Rx Refill/Question >> Aug 18, 2018  1:02 PM Burchel, Abbi R wrote: Medication: azithromycin (ZITHROMAX) 250 MG tablet   Has the patient contacted their pharmacy? Yes.   Pt states she is at the pharmacy and they are telling her they have not received this rx.  Pt informed the pharmacy it was e-scribed this morning at 11:26 am, but they are still saying they do not have anything for her. Please advise.   Preferred Pharmacy: [id number] [place], [place] - [address] [place] [address] Phone: [phone] Fax: [phone]

## 2018-08-18 NOTE — Telephone Encounter (Signed)
Please review, patient has an appt with you at 11am

## 2018-08-18 NOTE — Telephone Encounter (Signed)
Contacted pt regarding symptoms; she states that she is still having a productive cough, itchy throat, head congestion; she was seen in the office 08/12/18 for the same symptoms;  recommendations made per nurse triage protocol; the pt normally sees Dr Ancil Boozer but she has no availability; pt offered and accepted appointment with Suezanne Cheshire, [place], 08/18/18 at 1100; she verbalized understanding; will route to office for notification.     Reason for Disposition . [1] Known COPD or other severe lung disease (i.e., bronchiectasis, cystic fibrosis, lung surgery) AND [2] worsening symptoms (i.e., increased sputum purulence or amount, increased breathing difficulty  Answer Assessment - Initial Assessment Questions 1. ONSET: "When did the cough begin?"      Seen in office 08/12/18 2. SEVERITY: "How bad is the cough today?"      Worse at night 3. RESPIRATORY DISTRESS: "Describe your breathing."     breating ok 4. FEVER: "Do you have a fever?" If so, ask: "What is your temperature, how was it measured, and when did it start?"     no 5. SPUTUM: "Describe the color of your sputum" (clear, white, yellow, green)     Yellow, thick 6. HEMOPTYSIS: "Are you coughing up any blood?" If so ask: "How much?" (flecks, streaks, tablespoons, etc.)    Blood mixed with mucus 7. CARDIAC HISTORY: "Do you have any history of heart disease?" (e.g., heart attack, congestive heart failure)      Congestive heart failure 8. LUNG HISTORY: "Do you have any history of lung disease?"  (e.g., pulmonary embolus, asthma, emphysema)     COPD 9. PE RISK FACTORS: "Do you have a history of blood clots?" (or: recent major surgery, recent prolonged travel, bedridden)     no 10. OTHER SYMPTOMS: "Do you have any other symptoms?" (e.g., runny nose, wheezing, chest pain)       Itchy throat, head congestion 11. PREGNANCY: "Is there any chance you are pregnant?" "When was your last menstrual period?"       n/a 12. TRAVEL:  "Have you traveled out of the country in the last month?" (e.g., travel history, exposures)       no  Protocols used: [place]

## 2018-08-18 NOTE — Telephone Encounter (Signed)
Copied from [place] [id number]. Topic: General - Other >> Aug 18, 2018  1:23 PM Alfredia Ferguson R wrote: Patient called in and stated pharmacy hasnt received script for Azithromycin 250 MG

## 2018-08-18 NOTE — Patient Instructions (Addendum)
-   Please start Z-Pack today; call Dr. Marianna Payment office to see if dose of coumadin needs to be adjusted - Continue to take tessalon perls and musinex  - Use humidified air; or hot steamy shower.  - Drink plenty of water at least 6-8 glasses of water a day - If having worse shortness of breath or symptoms please come in here or get urgent medical attention.

## 2018-08-23 DIAGNOSIS — Z9889 Other specified postprocedural states: Secondary | ICD-10-CM | POA: Diagnosis not present

## 2018-08-23 DIAGNOSIS — I480 Paroxysmal atrial fibrillation: Secondary | ICD-10-CM | POA: Diagnosis not present

## 2018-09-29 DIAGNOSIS — I480 Paroxysmal atrial fibrillation: Secondary | ICD-10-CM | POA: Diagnosis not present

## 2018-09-29 DIAGNOSIS — Z9889 Other specified postprocedural states: Secondary | ICD-10-CM | POA: Diagnosis not present

## 2018-10-02 ENCOUNTER — Other Ambulatory Visit: Payer: Self-pay | Admitting: Nurse Practitioner

## 2018-10-02 DIAGNOSIS — R05 Cough: Secondary | ICD-10-CM

## 2018-10-02 DIAGNOSIS — R059 Cough, unspecified: Secondary | ICD-10-CM

## 2018-10-21 ENCOUNTER — Ambulatory Visit: Payer: Medicare Other

## 2018-10-26 ENCOUNTER — Ambulatory Visit (INDEPENDENT_AMBULATORY_CARE_PROVIDER_SITE_OTHER): Payer: Medicare Other

## 2018-10-26 VITALS — BP 112/70 | HR 57 | Temp 98.1°F | Resp 16 | Ht 64.0 in | Wt 183.3 lb

## 2018-10-26 DIAGNOSIS — Z0000 Encounter for general adult medical examination without abnormal findings: Secondary | ICD-10-CM | POA: Diagnosis not present

## 2018-10-26 NOTE — Patient Instructions (Signed)
[name] , Thank you for taking time to come for your Medicare Wellness Visit. I appreciate your ongoing commitment to your health goals. Please review the following plan we discussed and let me know if I can assist you in the future.   Screening recommendations/referrals: Colonoscopy: Cologuard ordered. Please complete and send off for testing.  Mammogram: done 04/21/18 Bone Density: Please call [phone] to schedule your bone density screening. Recommended yearly ophthalmology/optometry visit for glaucoma screening and checkup Recommended yearly dental visit for hygiene and checkup  Vaccinations: Influenza vaccine: done 05/13/18 Pneumococcal vaccine: done 10/10/14 Shingles vaccine: Shingrix discussed. Please contact your pharmacy for coverage information.   Advanced directives: Advance directive discussed with you today. Even though you declined this today please call our office should you change your mind and we can give you the proper paperwork for you to fill out.  Conditions/risks identified: Recommend healthy eating and physical activity for desired weight loss.   Next appointment: Please follow up in one year for your Medicare Annual Wellness visit.    Preventive Care 34 Years and Older, Female Preventive care refers to lifestyle choices and visits with your health care provider that can promote health and wellness. What does preventive care include?  A yearly physical exam. This is also called an annual well check.  Dental exams once or twice a year.  Routine eye exams. Ask your health care provider how often you should have your eyes checked.  Personal lifestyle choices, including:  Daily care of your teeth and gums.  Regular physical activity.  Eating a healthy diet.  Avoiding tobacco and drug use.  Limiting alcohol use.  Practicing safe sex.  Taking low-dose aspirin every day.  Taking vitamin and mineral supplements as recommended by your health care  provider. What happens during an annual well check? The services and screenings done by your health care provider during your annual well check will depend on your age, overall health, lifestyle risk factors, and family history of disease. Counseling  Your health care provider may ask you questions about your:  Alcohol use.  Tobacco use.  Drug use.  Emotional well-being.  Home and relationship well-being.  Sexual activity.  Eating habits.  History of falls.  Memory and ability to understand (cognition).  Work and work Statistician.  Reproductive health. Screening  You may have the following tests or measurements:  Height, weight, and BMI.  Blood pressure.  Lipid and cholesterol levels. These may be checked every 5 years, or more frequently if you are over 55 years old.  Skin check.  Lung cancer screening. You may have this screening every year starting at age 75 if you have a 30-pack-year history of smoking and currently smoke or have quit within the past 15 years.  Fecal occult blood test (FOBT) of the stool. You may have this test every year starting at age 70.  Flexible sigmoidoscopy or colonoscopy. You may have a sigmoidoscopy every 5 years or a colonoscopy every 10 years starting at age 32.  Hepatitis C blood test.  Hepatitis B blood test.  Sexually transmitted disease (STD) testing.  Diabetes screening. This is done by checking your blood sugar (glucose) after you have not eaten for a while (fasting). You may have this done every 1-3 years.  Bone density scan. This is done to screen for osteoporosis. You may have this done starting at age 55.  Mammogram. This may be done every 1-2 years. Talk to your health care provider about how often you  should have regular mammograms. Talk with your health care provider about your test results, treatment options, and if necessary, the need for more tests. Vaccines  Your health care provider may recommend certain  vaccines, such as:  Influenza vaccine. This is recommended every year.  Tetanus, diphtheria, and acellular pertussis (Tdap, Td) vaccine. You may need a Td booster every 10 years.  Zoster vaccine. You may need this after age 27.  Pneumococcal 13-valent conjugate (PCV13) vaccine. One dose is recommended after age 81.  Pneumococcal polysaccharide (PPSV23) vaccine. One dose is recommended after age 14. Talk to your health care provider about which screenings and vaccines you need and how often you need them. This information is not intended to replace advice given to you by your health care provider. Make sure you discuss any questions you have with your health care provider. Document Released: 09/21/2015 Document Revised: 05/14/2016 Document Reviewed: 06/26/2015 Elsevier Interactive Patient Education  2017 [employer] Prevention in the Home Falls can cause injuries. They can happen to people of all ages. There are many things you can do to make your home safe and to help prevent falls. What can I do on the outside of my home?  Regularly fix the edges of walkways and driveways and fix any cracks.  Remove anything that might make you trip as you walk through a door, such as a raised step or threshold.  Trim any bushes or trees on the path to your home.  Use bright outdoor lighting.  Clear any walking paths of anything that might make someone trip, such as rocks or tools.  Regularly check to see if handrails are loose or broken. Make sure that both sides of any steps have handrails.  Any raised decks and porches should have guardrails on the edges.  Have any leaves, snow, or ice cleared regularly.  Use sand or salt on walking paths during winter.  Clean up any spills in your garage right away. This includes oil or grease spills. What can I do in the bathroom?  Use night lights.  Install grab bars by the toilet and in the tub and shower. Do not use towel bars as grab  bars.  Use non-skid mats or decals in the tub or shower.  If you need to sit down in the shower, use a plastic, non-slip stool.  Keep the floor dry. Clean up any water that spills on the floor as soon as it happens.  Remove soap buildup in the tub or shower regularly.  Attach bath mats securely with double-sided non-slip rug tape.  Do not have throw rugs and other things on the floor that can make you trip. What can I do in the bedroom?  Use night lights.  Make sure that you have a light by your bed that is easy to reach.  Do not use any sheets or blankets that are too big for your bed. They should not hang down onto the floor.  Have a firm chair that has side arms. You can use this for support while you get dressed.  Do not have throw rugs and other things on the floor that can make you trip. What can I do in the kitchen?  Clean up any spills right away.  Avoid walking on wet floors.  Keep items that you use a lot in easy-to-reach places.  If you need to reach something above you, use a strong step stool that has a grab bar.  Keep electrical cords out  of the way.  Do not use floor polish or wax that makes floors slippery. If you must use wax, use non-skid floor wax.  Do not have throw rugs and other things on the floor that can make you trip. What can I do with my stairs?  Do not leave any items on the stairs.  Make sure that there are handrails on both sides of the stairs and use them. Fix handrails that are broken or loose. Make sure that handrails are as long as the stairways.  Check any carpeting to make sure that it is firmly attached to the stairs. Fix any carpet that is loose or worn.  Avoid having throw rugs at the top or bottom of the stairs. If you do have throw rugs, attach them to the floor with carpet tape.  Make sure that you have a light switch at the top of the stairs and the bottom of the stairs. If you do not have them, ask someone to add them for  you. What else can I do to help prevent falls?  Wear shoes that:  Do not have high heels.  Have rubber bottoms.  Are comfortable and fit you well.  Are closed at the toe. Do not wear sandals.  If you use a stepladder:  Make sure that it is fully opened. Do not climb a closed stepladder.  Make sure that both sides of the stepladder are locked into place.  Ask someone to hold it for you, if possible.  Clearly mark and make sure that you can see:  Any grab bars or handrails.  First and last steps.  Where the edge of each step is.  Use tools that help you move around (mobility aids) if they are needed. These include:  Canes.  Walkers.  Scooters.  Crutches.  Turn on the lights when you go into a dark area. Replace any light bulbs as soon as they burn out.  Set up your furniture so you have a clear path. Avoid moving your furniture around.  If any of your floors are uneven, fix them.  If there are any pets around you, be aware of where they are.  Review your medicines with your doctor. Some medicines can make you feel dizzy. This can increase your chance of falling. Ask your doctor what other things that you can do to help prevent falls. This information is not intended to replace advice given to you by your health care provider. Make sure you discuss any questions you have with your health care provider. Document Released: 06/21/2009 Document Revised: 01/31/2016 Document Reviewed: 09/29/2014 Elsevier Interactive Patient Education  2017 [employer].

## 2018-10-26 NOTE — Progress Notes (Signed)
Subjective:   [name] is a 78 y.o. female who presents for Medicare Annual (Subsequent) preventive examination.  Review of Systems:   Cardiac Risk Factors include: advanced age (>65men, >71 women);dyslipidemia;hypertension;obesity (BMI >30kg/m2)     Objective:     Vitals: BP 112/70 (BP Location: Left Arm, Patient Position: Sitting, Cuff Size: Normal)   Pulse (!) 57   Temp 98.1 F (36.7 C) (Oral)   Resp 16   Ht 5\' 4"  (1.626 m)   Wt 183 lb 4.8 oz (83.1 kg)   SpO2 97%   BMI 31.46 kg/m   Body mass index is 31.46 kg/m.  Advanced Directives 10/26/2018 03/30/2018 12/16/2017 08/24/2017 07/31/2017 07/31/2017 04/14/2017  Does Patient Have a Medical Advance Directive? No No No No No Yes No  Type of Advance Directive - - - - - Press photographer -  Does patient want to make changes to medical advance directive? - No - Patient declined - - - - -  Copy of [place] in Chart? - No - copy requested - - - - -  Would patient like information on creating a medical advance directive? No - Patient declined No - Patient declined No - Patient declined - No - Patient declined - -    Tobacco Social History   Tobacco Use  Smoking Status Former Smoker  . Last attempt to quit: 09/09/2007  . Years since quitting: 11.1  Smokeless Tobacco Never Used     Counseling given: Not Answered   Clinical Intake:  Pre-visit preparation completed: Yes  Pain : No/denies pain     BMI - recorded: 31.46 Nutritional Status: BMI > 30  Obese Nutritional Risks: None Diabetes: No  How often do you need to have someone help you when you read instructions, pamphlets, or other written materials from your doctor or pharmacy?: 1 - Never What is the last grade level you completed in school?: 12th grade  Interpreter Needed?: No  Information entered by :: Clemetine Marker LPN  Past Medical History:  Diagnosis Date  . Atrial fibrillation ([place])   . Breast cancer ([place]) 1992   positive, radiation  . CHF (congestive heart failure) ([place])   . COPD (chronic obstructive pulmonary disease) ([place])   . Dysrhythmia   . Gout   . History of aortic valve replacement   . Hyperlipidemia   . Hypertension    Past Surgical History:  Procedure Laterality Date  . ABDOMINAL HYSTERECTOMY  1996  . arm surgery  2019  . BREAST BIOPSY Right 1998   neg  . BREAST BIOPSY Right 2007   neg  . BREAST BIOPSY Right 1992   positive  . BREAST EXCISIONAL BIOPSY Left 2000   neg  . CARDIAC VALVE REPLACEMENT  2014  . CATARACT EXTRACTION W/PHACO Left 03/30/2018   Procedure: CATARACT EXTRACTION PHACO AND INTRAOCULAR LENS PLACEMENT (IOC);  Surgeon: Birder Robson, MD;  Location: [facility] ORS;  Service: Ophthalmology;  Laterality: Left;  [place] 00:31.9 AP% 12.1 CDE 3.87 Fluid Pack lot # [id number]  . FRACTURE SURGERY Left    ARM/ LEG  . LEG SURGERY     Family History  Problem Relation Age of Onset  . Cancer Mother   . Diabetes Mother   . Hypertension Mother   . Breast cancer Mother 40  . Hypertension Father   . Heart failure Father   . Cancer Brother        bladder  . Cancer Sister  breast  . Breast cancer Sister 79  . Hypertension Son   . Hypertension Daughter   . Breast cancer Sister 75   Social History   Socioeconomic History  . Marital status: Widowed    Spouse name: Not on file  . Number of children: 2  . Years of education: Not on file  . Highest education level: 12th grade  Occupational History  . Occupation: cafeteria     Comment: [employer]  Social Needs  . Financial resource strain: Not hard at all  . Food insecurity:    Worry: Never true    Inability: Never true  . Transportation needs:    Medical: No    Non-medical: No  Tobacco Use  . Smoking status: Former Smoker    Last attempt to quit: 09/09/2007    Years since quitting: 11.1  . Smokeless tobacco: Never Used  Substance and Sexual Activity  . Alcohol use: No    Alcohol/week: 0.0 standard  drinks  . Drug use: No  . Sexual activity: Not Currently  Lifestyle  . Physical activity:    Days per week: 0 days    Minutes per session: 0 min  . Stress: Only a little  Relationships  . Social connections:    Talks on phone: More than three times a week    Gets together: More than three times a week    Attends religious service: More than 4 times per year    Active member of club or organization: Yes    Attends meetings of clubs or organizations: More than 4 times per year    Relationship status: Widowed  Other Topics Concern  . Not on file  Social History Narrative  . Not on file    Outpatient Encounter Medications as of 10/26/2018  Medication Sig  . acetaminophen (TYLENOL) 500 MG tablet Take 1,000-1,500 mg by mouth every 6 (six) hours as needed for mild pain.  Marland Kitchen allopurinol (ZYLOPRIM) 300 MG tablet Take 1 tablet (300 mg total) by mouth daily.  Marland Kitchen atorvastatin (LIPITOR) 80 MG tablet Take 1 tablet (80 mg total) by mouth at bedtime.  . Calcium Carbonate-Vitamin D 600-400 MG-UNIT tablet Take 1 tablet by mouth 2 (two) times daily.  . digoxin (LANOXIN) 0.125 MG tablet Take 1 tablet by mouth daily as needed (HEART PALPITATIONS).   . fluticasone (FLONASE) 50 MCG/ACT nasal spray Place 2 sprays into both nostrils daily.  . metoprolol tartrate (LOPRESSOR) 25 MG tablet Take 1 tablet (25 mg total) by mouth 2 (two) times daily.  . potassium chloride SA (KLOR-CON M20) 20 MEQ tablet Take 1 tablet (20 mEq total) by mouth daily.  Marland Kitchen tiotropium (SPIRIVA HANDIHALER) 18 MCG inhalation capsule INHALE 1 CASPULE VIA HANDIHALER ONCE A DAY AT THE SAME TIME EVERY DAY (Patient taking differently: Place 18 mcg into inhaler and inhale daily as needed (WHEEZING). )  . torsemide (DEMADEX) 20 MG tablet Take 1 tablet (20 mg total) by mouth daily.  . traZODone (DESYREL) 50 MG tablet TAKE 1 TABLET BY MOUTH EVERYDAY AT BEDTIME  . warfarin (COUMADIN) 5 MG tablet Take 5 mg by mouth daily.  . [DISCONTINUED] magic  mouthwash w/lidocaine SOLN Swish and spit  . [DISCONTINUED] aspirin EC 81 MG tablet Take 1 tablet by mouth daily.  . [DISCONTINUED] azithromycin (ZITHROMAX) 250 MG tablet 2 tabs today and 1 tab everyday for the next 4 days  . [DISCONTINUED] benzonatate (TESSALON) 200 MG capsule TAKE 1 CAPSULE (200 MG TOTAL) BY MOUTH 2 (TWO) TIMES DAILY  AS NEEDED FOR COUGH.  . [DISCONTINUED] meclizine (ANTIVERT) 12.5 MG tablet Take 1 tablet (12.5 mg total) by mouth 3 (three) times daily as needed for dizziness.  . [DISCONTINUED] ondansetron (ZOFRAN) 4 MG tablet Take 1 tablet (4 mg total) by mouth every 8 (eight) hours as needed for nausea or vomiting.  . [DISCONTINUED] polyethylene glycol (MIRALAX / GLYCOLAX) packet Take 17 g by mouth daily as needed for moderate constipation.    No facility-administered encounter medications on file as of 10/26/2018.     Activities of Daily Living In your present state of health, do you have any difficulty performing the following activities: 10/26/2018 08/18/2018  Hearing? N N  Comment declines hearing aids -  Vision? N N  Comment wears glasses -  Difficulty concentrating or making decisions? N N  Walking or climbing stairs? N N  Dressing or bathing? N N  Doing errands, shopping? N N  Preparing Food and eating ? N -  Using the Toilet? N -  In the past six months, have you accidently leaked urine? N -  Do you have problems with loss of bowel control? N -  Managing your Medications? N -  Managing your Finances? N -  Housekeeping or managing your Housekeeping? N -  Some recent data might be hidden    Patient Care Team: [name], MD as PCP - General (Family Medicine)    Assessment:   This is a routine wellness examination for [place].  Exercise Activities and Dietary recommendations Current Exercise Habits: The patient does not participate in regular exercise at present, Exercise limited by: orthopedic condition(s)  Goals    . Weight (lb) < 170 lb (77.1 kg)       Recommend increasing physical activity and healthy eating for desired weight loss.        Fall Risk Fall Risk  10/26/2018 08/18/2018 08/12/2018 05/13/2018 02/18/2018  Falls in the past year? 0 0 0 Yes Yes  Number falls in past yr: 0 0 - 1 1  Injury with Fall? 0 0 - Yes Yes  Comment - - - - -  Risk for fall due to : History of fall(s) - - - -  Follow up Falls prevention discussed - - - -   FALL RISK PREVENTION PERTAINING TO THE HOME:  Any stairs in or around the home? Yes  outside on porch If so, do they handrails? No   Home free of loose throw rugs in walkways, pet beds, electrical cords, etc? Yes  Adequate lighting in your home to reduce risk of falls? Yes   ASSISTIVE DEVICES UTILIZED TO PREVENT FALLS:  Life alert? No  Use of a cane, walker or w/c? No  Grab bars in the bathroom? No  Shower chair or bench in shower? No  Elevated toilet seat or a handicapped toilet? Yes   DME ORDERS:  DME order needed?  No   TIMED UP AND GO:  Was the test performed? Yes .  Length of time to ambulate 10 feet: 6 sec.   GAIT:  Appearance of gait: Gait stead-fast and without the use of an assistive device.   Education: Fall risk prevention has been discussed.  Intervention(s) required? No   Depression Screen PHQ 2/9 Scores 10/26/2018 08/18/2018 05/13/2018 02/18/2018  PHQ - 2 Score 0 0 0 0  PHQ- 9 Score - 0 0 -     Cognitive Function     6CIT Screen 10/26/2018  What Year? 0 points  What month? 0 points  What time? 0 points  Count back from 20 0 points  Months in reverse 0 points  Repeat phrase 0 points  Total Score 0    Immunization History  Administered Date(s) Administered  . Influenza Split 07/23/2009, 05/21/2010  . Influenza, High Dose Seasonal PF 07/04/2015, 06/02/2017, 06/29/2017, 05/13/2018  . Influenza, Seasonal, Injecte, Preservative Fre 09/24/2011  . Influenza-Unspecified 05/31/2013, 05/30/2014, 04/28/2016  . Pneumococcal Conjugate-13 10/10/2014  . Pneumococcal  Polysaccharide-23 05/16/2008  . Zoster 10/10/2014    Qualifies for Shingles Vaccine? Yes  Zostavax completed 2016. Due for Shingrix. Education has been provided regarding the importance of this vaccine. Pt has been advised to call insurance company to determine out of pocket expense. Advised may also receive vaccine at local pharmacy or Health Dept. Verbalized acceptance and understanding.  Tdap: Although this vaccine is not a covered service during a Wellness Exam, does the patient still wish to receive this vaccine today?  No .  Education has been provided regarding the importance of this vaccine. Advised may receive this vaccine at local pharmacy or Health Dept. Aware to provide a copy of the vaccination record if obtained from local pharmacy or Health Dept. Verbalized acceptance and understanding.  Flu Vaccine: Up to date  Pneumococcal Vaccine: Up to date   Screening Tests Health Maintenance  Topic Date Due  . TETANUS/TDAP  04/14/1962  . Fecal DNA (Cologuard)  04/14/1993  . INFLUENZA VACCINE  Completed  . DEXA SCAN  Completed  . PNA vac Low Risk Adult  Completed    Cancer Screenings:  Colorectal Screening: Completed 12/23/2007. Repeat every 10 years. Cologuard ordered and patient waiting to complete.   Mammogram: Completed 04/21/18. Repeat every year.   Bone Density: Completed 2004. Results unavailable.  Repeat every 2 years. Ordered 02/18/18. Pt provided with contact information and advised to call to schedule appt.   Lung Cancer Screening: (Low Dose CT Chest recommended if Age 11-80 years, 30 pack-year currently smoking OR have quit w/in 15years.) does not qualify.   Additional Screening:  Hepatitis C Screening: no longer required  Vision Screening: Recommended annual ophthalmology exams for early detection of glaucoma and other disorders of the eye. Is the patient up to date with their annual eye exam?  Yes  Who is the provider or what is the name of the office in which  the pt attends annual eye exams? [place] Screening: Recommended annual dental exams for proper oral hygiene  Community Resource Referral:  CRR required this visit?  No       Plan:    I have personally reviewed and addressed the Medicare Annual Wellness questionnaire and have noted the following in the patient's chart:  A. Medical and social history B. Use of alcohol, tobacco or illicit drugs  C. Current medications and supplements D. Functional ability and status E.  Nutritional status F.  Physical activity G. Advance directives H. List of other physicians I.  Hospitalizations, surgeries, and ER visits in previous 12 months J.  [place] such as hearing and vision if needed, cognitive and depression L. Referrals and appointments   In addition, I have reviewed and discussed with patient certain preventive protocols, quality metrics, and best practice recommendations. A written personalized care plan for preventive services as well as general preventive health recommendations were provided to patient.   Signed,  Clemetine Marker, LPN Nurse Health Advisor   Nurse Notes: pt doing well and appreciative of visit today

## 2018-10-27 DIAGNOSIS — I480 Paroxysmal atrial fibrillation: Secondary | ICD-10-CM | POA: Diagnosis not present

## 2018-10-27 DIAGNOSIS — Z9889 Other specified postprocedural states: Secondary | ICD-10-CM | POA: Diagnosis not present

## 2018-11-03 ENCOUNTER — Other Ambulatory Visit: Payer: Self-pay | Admitting: Family Medicine

## 2018-11-03 DIAGNOSIS — I10 Essential (primary) hypertension: Secondary | ICD-10-CM

## 2018-11-03 DIAGNOSIS — I5042 Chronic combined systolic (congestive) and diastolic (congestive) heart failure: Secondary | ICD-10-CM

## 2018-11-10 DIAGNOSIS — Z9889 Other specified postprocedural states: Secondary | ICD-10-CM | POA: Diagnosis not present

## 2018-11-10 DIAGNOSIS — I480 Paroxysmal atrial fibrillation: Secondary | ICD-10-CM | POA: Diagnosis not present

## 2018-11-30 ENCOUNTER — Other Ambulatory Visit: Payer: Self-pay

## 2018-11-30 ENCOUNTER — Ambulatory Visit (INDEPENDENT_AMBULATORY_CARE_PROVIDER_SITE_OTHER): Payer: Medicare Other | Admitting: Family Medicine

## 2018-11-30 ENCOUNTER — Encounter: Payer: Self-pay | Admitting: Family Medicine

## 2018-11-30 VITALS — BP 116/70 | HR 84 | Temp 98.0°F | Resp 16 | Ht 64.0 in | Wt 176.6 lb

## 2018-11-30 DIAGNOSIS — E559 Vitamin D deficiency, unspecified: Secondary | ICD-10-CM | POA: Diagnosis not present

## 2018-11-30 DIAGNOSIS — R739 Hyperglycemia, unspecified: Secondary | ICD-10-CM | POA: Diagnosis not present

## 2018-11-30 DIAGNOSIS — F325 Major depressive disorder, single episode, in full remission: Secondary | ICD-10-CM

## 2018-11-30 DIAGNOSIS — E8881 Metabolic syndrome: Secondary | ICD-10-CM

## 2018-11-30 DIAGNOSIS — G43109 Migraine with aura, not intractable, without status migrainosus: Secondary | ICD-10-CM | POA: Diagnosis not present

## 2018-11-30 DIAGNOSIS — M109 Gout, unspecified: Secondary | ICD-10-CM

## 2018-11-30 DIAGNOSIS — E785 Hyperlipidemia, unspecified: Secondary | ICD-10-CM

## 2018-11-30 DIAGNOSIS — I5042 Chronic combined systolic (congestive) and diastolic (congestive) heart failure: Secondary | ICD-10-CM | POA: Diagnosis not present

## 2018-11-30 DIAGNOSIS — J410 Simple chronic bronchitis: Secondary | ICD-10-CM

## 2018-11-30 DIAGNOSIS — I10 Essential (primary) hypertension: Secondary | ICD-10-CM

## 2018-11-30 DIAGNOSIS — I480 Paroxysmal atrial fibrillation: Secondary | ICD-10-CM

## 2018-11-30 DIAGNOSIS — G4700 Insomnia, unspecified: Secondary | ICD-10-CM | POA: Diagnosis not present

## 2018-11-30 MED ORDER — ALLOPURINOL 300 MG PO TABS: 300.0000 mg | ORAL_TABLET | Freq: Every day | ORAL | 1 refills | Status: DC

## 2018-11-30 MED ORDER — METOPROLOL TARTRATE 25 MG PO TABS: 25.0000 mg | ORAL_TABLET | Freq: Two times a day (BID) | ORAL | 1 refills | Status: DC

## 2018-11-30 MED ORDER — POTASSIUM CHLORIDE CRYS ER 20 MEQ PO TBCR: 20.0000 meq | EXTENDED_RELEASE_TABLET | Freq: Every day | ORAL | 1 refills | Status: DC

## 2018-11-30 MED ORDER — ATORVASTATIN CALCIUM 80 MG PO TABS: 80.0000 mg | ORAL_TABLET | Freq: Every day | ORAL | 1 refills | Status: DC

## 2018-11-30 MED ORDER — TORSEMIDE 20 MG PO TABS: 20.0000 mg | ORAL_TABLET | Freq: Every day | ORAL | 1 refills | Status: DC

## 2018-11-30 MED ORDER — TRAZODONE HCL 50 MG PO TABS: 50.0000 mg | ORAL_TABLET | Freq: Every day | ORAL | 0 refills | Status: DC

## 2018-11-30 NOTE — Progress Notes (Signed)
Name: [name]   MRN: [id number]    DOB: 03/12/1941   Date:11/30/2018       Progress Note  Subjective  Chief Complaint  Chief Complaint  Patient presents with  . Medication Refill    4 month F/U  . Hypertension  . Hyperlipidemia  . Insomnia  . Depression  . Migraine  . Atrial Fibrillation    HPI  Chronic combined CHF: she is on Demadex and potassium,offdigoxin( she states only taking it prn)and beta-blocker . No chest , no SOB recently, she denies orthopnea. She is not on ACE because of cough, she was on Losartan for a period of time but bp dropped so cardiologist discontinued medications   HTN: taking metoprolol, bp much better controlled since aortic valve replacement. No chest pain , she states no SOB at this time. Doing well on weight loss   Hyperlipidemia:she is not taking zetia daily because of cost, stopped medication, she has been compliant with atorvastatin because of muscle pain, explained importance of taking it daily.   Insomnia:she takes Trazodone prn, most nights she sleeps without medication. Taking a couple of times a week.   Gout: no recent episodes, taking allopurinol daily.   Dysmetabolic Syndrome: she is not very compliant with diet. She denies polyphagia, polyuria or polydipsiaLast hgbA1C , but we will recheck today   Afib: rate controlled, no fluttering sensation, taking betablocker andoffdigoxin, also on coumadin and goes to coumadin clinic/Dr. Callwood.She is on lopressor and denies side effects of medications   Chronic bronchitis : she is taking Spiriva and takes it prn for wheezing orSOB. She states she has a productive cough in am's but denies SOB. Quit smoking in 2010  Depression:she has a long history of depression, used to take SSRI's, symptoms now are intermittent and has been in remission for months, only symptoms is difficulty sleeping and takes trazodone prn only . She denies suicidal thoughts or ideation. She is  motivated, working part time and working on her diet to lose weight.   Migraine headaches: she has occasional episodes of aura, described as scotomas and recently not followed by a headache. Usually triggered by not sleeping well or skipping a meal, but improves with rest or when she eats food.She does not need to take medications for it.   Obesity: she is doing well on a [employer], low calories for 3 days and 4th able to eat 1500 calories, lost 7 lbs since Feb and is pleased.   Patient Active Problem List   Diagnosis Date Noted  . On Coumadin for atrial fibrillation ([place]) 06/30/2018  . Chronic anticoagulation 08/27/2017  . History of right breast cancer 08/27/2017  . Chronic obstructive pulmonary disease ([place]) 07/04/2015  . Controlled gout 06/06/2015  . Anxiety 02/09/2015  . Synovial cyst of popliteal space 02/09/2015  . Benign hypertension 02/09/2015  . Blood type A+ 02/09/2015  . Arterial branch occlusion of retina 02/09/2015  . Chronic constipation 02/09/2015  . Insomnia, persistent 02/09/2015  . Chronic combined systolic and diastolic CHF, NYHA class 1 ([place]) 02/09/2015  . Dyslipidemia 02/09/2015  . Atrial fibrillation ([place]) 02/09/2015  . Arthritis urica 02/09/2015  . Hearing loss 02/09/2015  . History of open heart surgery 02/09/2015  . Chronic hoarseness 02/09/2015  . Cardiomegaly 02/09/2015  . Dysmetabolic syndrome 60/73/7106  . Migraine with aura and without status migrainosus, not intractable 02/09/2015  . Adult BMI 30+ 02/09/2015  . Hypertensive pulmonary vascular disease ([place]) 02/09/2015  . Allergic rhinitis, seasonal 02/09/2015  .  Vitamin D deficiency 02/09/2015  . History of mitral valve repair 10/01/2012  . History of aortic valve replacement 10/01/2012  . Congestive heart failure ([place]) 10/01/2012    Past Surgical History:  Procedure Laterality Date  . ABDOMINAL HYSTERECTOMY  1996  . arm surgery  2019  . BREAST BIOPSY Right 1998   neg  . BREAST BIOPSY  Right 2007   neg  . BREAST BIOPSY Right 1992   positive  . BREAST EXCISIONAL BIOPSY Left 2000   neg  . CARDIAC VALVE REPLACEMENT  2014  . CATARACT EXTRACTION W/PHACO Left 03/30/2018   Procedure: CATARACT EXTRACTION PHACO AND INTRAOCULAR LENS PLACEMENT (IOC);  Surgeon: Birder Robson, MD;  Location: [facility] ORS;  Service: Ophthalmology;  Laterality: Left;  [place] 00:31.9 AP% 12.1 CDE 3.87 Fluid Pack lot # [id number]  . FRACTURE SURGERY Left    ARM/ LEG  . LEG SURGERY      Family History  Problem Relation Age of Onset  . Cancer Mother   . Diabetes Mother   . Hypertension Mother   . Breast cancer Mother 66  . Hypertension Father   . Heart failure Father   . Cancer Brother        bladder  . Cancer Sister        breast  . Breast cancer Sister 21  . Hypertension Son   . Hypertension Daughter   . Breast cancer Sister 35    Social History   Socioeconomic History  . Marital status: Widowed    Spouse name: Not on file  . Number of children: 2  . Years of education: Not on file  . Highest education level: 12th grade  Occupational History  . Occupation: cafeteria     Comment: [employer]  Social Needs  . Financial resource strain: Not hard at all  . Food insecurity:    Worry: Never true    Inability: Never true  . Transportation needs:    Medical: No    Non-medical: No  Tobacco Use  . Smoking status: Former Smoker    Last attempt to quit: 09/09/2007    Years since quitting: 11.2  . Smokeless tobacco: Never Used  Substance and Sexual Activity  . Alcohol use: No    Alcohol/week: 0.0 standard drinks  . Drug use: No  . Sexual activity: Not Currently  Lifestyle  . Physical activity:    Days per week: 0 days    Minutes per session: 0 min  . Stress: Only a little  Relationships  . Social connections:    Talks on phone: More than three times a week    Gets together: More than three times a week    Attends religious service: More than 4 times per year    Active  member of club or organization: Yes    Attends meetings of clubs or organizations: More than 4 times per year    Relationship status: Widowed  . Intimate partner violence:    Fear of current or ex partner: No    Emotionally abused: No    Physically abused: No    Forced sexual activity: No  Other Topics Concern  . Not on file  Social History Narrative  . Not on file     Current Outpatient Medications:  .  acetaminophen (TYLENOL) 500 MG tablet, Take 1,000-1,500 mg by mouth every 6 (six) hours as needed for mild pain., Disp: , Rfl:  .  allopurinol (ZYLOPRIM) 300 MG tablet, Take 1 tablet (300 mg total)  by mouth daily., Disp: 90 tablet, Rfl: 1 .  atorvastatin (LIPITOR) 80 MG tablet, Take 1 tablet (80 mg total) by mouth at bedtime., Disp: 90 tablet, Rfl: 1 .  digoxin (LANOXIN) 0.125 MG tablet, Take 1 tablet by mouth daily as needed (HEART PALPITATIONS). , Disp: , Rfl:  .  fluticasone (FLONASE) 50 MCG/ACT nasal spray, Place 2 sprays into both nostrils daily., Disp: 16 g, Rfl: 6 .  metoprolol tartrate (LOPRESSOR) 25 MG tablet, Take 1 tablet (25 mg total) by mouth 2 (two) times daily., Disp: 180 tablet, Rfl: 1 .  potassium chloride SA (KLOR-CON M20) 20 MEQ tablet, Take 1 tablet (20 mEq total) by mouth daily., Disp: 90 tablet, Rfl: 1 .  tiotropium (SPIRIVA HANDIHALER) 18 MCG inhalation capsule, INHALE 1 CASPULE VIA HANDIHALER ONCE A DAY AT THE SAME TIME EVERY DAY (Patient taking differently: Place 18 mcg into inhaler and inhale daily as needed (WHEEZING). ), Disp: 90 capsule, Rfl: 1 .  torsemide (DEMADEX) 20 MG tablet, TAKE 1 TABLET BY MOUTH EVERY DAY, Disp: 30 tablet, Rfl: 0 .  traZODone (DESYREL) 50 MG tablet, TAKE 1 TABLET BY MOUTH EVERYDAY AT BEDTIME, Disp: 90 tablet, Rfl: 1 .  warfarin (COUMADIN) 5 MG tablet, Take 5 mg by mouth daily., Disp: , Rfl:  .  Calcium Carbonate-Vitamin D 600-400 MG-UNIT tablet, Take 1 tablet by mouth 2 (two) times daily., Disp: , Rfl:   Allergies  Allergen Reactions   . Ace Inhibitors Cough and Other (See Comments)  . Amoxicillin-Pot Clavulanate Nausea And Vomiting  . Codeine Nausea And Vomiting  . Penicillins Nausea And Vomiting and Other (See Comments)    Has patient had a PCN reaction causing immediate rash, facial/tongue/throat swelling, SOB or lightheadedness with hypotension: No Has patient had a PCN reaction causing severe rash involving mucus membranes or skin necrosis: No Has patient had a PCN reaction that required hospitalization No Has patient had a PCN reaction occurring within the last 10 years: No If all of the above answers are "NO", then may proceed with Cephalosporin use.  . Rosuvastatin Other (See Comments)    Reaction:  Joint pain   . Tetanus-Diphtheria Toxoids Td Swelling    I personally reviewed active problem list, medication list, allergies, family history, social history with the patient/caregiver today.   ROS  Constitutional: Negative for fever, positive for  weight change.  Respiratory: Negative for cough and intermittent shortness of breath.   Cardiovascular: Negative for chest pain or palpitations.  Gastrointestinal: Negative for abdominal pain, no bowel changes.  Musculoskeletal: Negative for gait problem or joint swelling.  Skin: Negative for rash.  Neurological: Negative for dizziness or headache.  No other specific complaints in a complete review of systems (except as listed in HPI above).  Objective  Vitals:   11/30/18 1006  BP: 116/70  Pulse: 84  Resp: 16  Temp: 98 F (36.7 C)  TempSrc: Oral  SpO2: 95%  Weight: 176 lb 9.6 oz (80.1 kg)  Height: 5\' 4"  (1.626 m)    Body mass index is 30.31 kg/m.  Physical Exam  Constitutional: Patient appears well-developed and well-nourished. Obese No distress.  HEENT: head atraumatic, normocephalic, pupils equal and reactive to light, neck supple, throat within normal limits Cardiovascular: Normal rate, regular rhythm and normal heart sounds.  2/6 murmur heard.  No  BLE edema. Pulmonary/Chest: Effort normal and breath sounds normal. No respiratory distress. Abdominal: Soft.  There is no tenderness. Psychiatric: Patient has a normal mood and affect. behavior is normal. Judgment  and thought content normal.  PHQ2/9: Depression screen [facility] 2/9 11/30/2018 10/26/2018 08/18/2018 05/13/2018 02/18/2018  Decreased Interest 0 0 0 0 0  Down, Depressed, Hopeless 0 0 0 0 0  PHQ - 2 Score 0 0 0 0 0  Altered sleeping 2 - 0 0 3  Tired, decreased energy 0 - 0 0 1  Change in appetite 1 - 0 0 3  Feeling bad or failure about yourself  0 - 0 0 3  Trouble concentrating 0 - 0 0 0  Moving slowly or fidgety/restless 0 - 0 0 0  Suicidal thoughts 0 - 0 0 0  PHQ-9 Score 3 - 0 0 -  Difficult doing work/chores Somewhat difficult - Not difficult at all Not difficult at all Somewhat difficult   phq 9 negative   Fall Risk: Fall Risk  11/30/2018 10/26/2018 08/18/2018 08/12/2018 05/13/2018  Falls in the past year? 0 0 0 0 Yes  Number falls in past yr: 0 0 0 - 1  Injury with Fall? 0 0 0 - Yes  Comment - - - - -  Risk for fall due to : - History of fall(s) - - -  Follow up - Falls prevention discussed - - -     Functional Status Survey: Is the patient deaf or have difficulty hearing?: No Does the patient have difficulty seeing, even when wearing glasses/contacts?: Yes Does the patient have difficulty concentrating, remembering, or making decisions?: No Does the patient have difficulty walking or climbing stairs?: No Does the patient have difficulty dressing or bathing?: No Does the patient have difficulty doing errands alone such as visiting a doctor's office or shopping?: No    Assessment & Plan  1. Simple chronic bronchitis (HCC)  Taking spiriva prn and doing well at this time  2. Benign hypertension  - COMPLETE METABOLIC PANEL WITH GFR - CBC with Differential/Platelet - torsemide (DEMADEX) 20 MG tablet; Take 1 tablet (20 mg total) by mouth daily.  Dispense: 90  tablet; Refill: 1  3. Major depression in remission [facility])  Doing well at this time, only problems sleeping and takes prn trazodone   4. Migraine with aura and without status migrainosus, not intractable  Improved, very seldom has aura  5. Paroxysmal atrial fibrillation (HCC)  - metoprolol tartrate (LOPRESSOR) 25 MG tablet; Take 1 tablet (25 mg total) by mouth 2 (two) times daily.  Dispense: 180 tablet; Refill: 1  6. Dysmetabolic syndrome  Recheck labs  7. Chronic combined systolic and diastolic CHF, NYHA class 1 (HCC)  - potassium chloride SA (KLOR-CON M20) 20 MEQ tablet; Take 1 tablet (20 mEq total) by mouth daily.  Dispense: 90 tablet; Refill: 1 - torsemide (DEMADEX) 20 MG tablet; Take 1 tablet (20 mg total) by mouth daily.  Dispense: 90 tablet; Refill: 1  8. Dyslipidemia  - COMPLETE METABOLIC PANEL WITH GFR - Lipid panel - atorvastatin (LIPITOR) 80 MG tablet; Take 1 tablet (80 mg total) by mouth at bedtime.  Dispense: 90 tablet; Refill: 1  9. Controlled gout  - allopurinol (ZYLOPRIM) 300 MG tablet; Take 1 tablet (300 mg total) by mouth daily.  Dispense: 90 tablet; Refill: 1  10. Hyperglycemia  - Hemoglobin A1c  11. Vitamin D deficiency  - VITAMIN D 25 Hydroxy (Vit-D Deficiency, Fractures)  12. Insomnia, unspecified type  - traZODone (DESYREL) 50 MG tablet; Take 1 tablet (50 mg total) by mouth at bedtime.  Dispense: 90 tablet; Refill: 0

## 2018-12-01 ENCOUNTER — Ambulatory Visit: Payer: Medicare Other | Admitting: Family Medicine

## 2018-12-01 LAB — COMPLETE METABOLIC PANEL WITH GFR
AG Ratio: 1.7 (calc) (ref 1.0–2.5)
ALKALINE PHOSPHATASE (APISO): 69 U/L (ref 37–153)
ALT: 16 U/L (ref 6–29)
AST: 22 U/L (ref 10–35)
Albumin: 4.3 g/dL (ref 3.6–5.1)
BILIRUBIN TOTAL: 0.7 mg/dL (ref 0.2–1.2)
BUN: 21 mg/dL (ref 7–25)
CHLORIDE: 100 mmol/L (ref 98–110)
CO2: 29 mmol/L (ref 20–32)
Calcium: 9.8 mg/dL (ref 8.6–10.4)
Creat: 0.92 mg/dL (ref 0.60–0.93)
GFR, Est African American: 71 mL/min/{1.73_m2} (ref >=60)
GFR, Est Non African American: 61 mL/min/{1.73_m2} (ref >=60)
Globulin: 2.6 g/dL (calc) (ref 1.9–3.7)
Glucose, Bld: 96 mg/dL (ref 65–99)
Potassium: 3.4 mmol/L — ABNORMAL LOW (ref 3.5–5.3)
Sodium: 139 mmol/L (ref 135–146)
Total Protein: 6.9 g/dL (ref 6.1–8.1)

## 2018-12-01 LAB — CBC WITH DIFFERENTIAL/PLATELET
Absolute Monocytes: 405 cells/uL (ref 200–950)
Basophils Absolute: 30 cells/uL (ref 0–200)
Basophils Relative: 0.6 %
Eosinophils Absolute: 80 cells/uL (ref 15–500)
Eosinophils Relative: 1.6 %
HCT: 39.1 % (ref 35.0–45.0)
HEMOGLOBIN: 12.9 g/dL (ref 11.7–15.5)
Lymphs Abs: 2210 cells/uL (ref 850–3900)
MCH: 28.9 pg (ref 27.0–33.0)
MCHC: 33 g/dL (ref 32.0–36.0)
MCV: 87.5 fL (ref 80.0–100.0)
MPV: 11.5 fL (ref 7.5–12.5)
Monocytes Relative: 8.1 %
Neutro Abs: 2275 cells/uL (ref 1500–7800)
Neutrophils Relative %: 45.5 %
Platelets: 246 10*3/uL (ref 140–400)
RBC: 4.47 10*6/uL (ref 3.80–5.10)
RDW: 12.6 % (ref 11.0–15.0)
Total Lymphocyte: 44.2 %
WBC: 5 10*3/uL (ref 3.8–10.8)

## 2018-12-01 LAB — LIPID PANEL
Cholesterol: 299 mg/dL — ABNORMAL HIGH (ref <200)
HDL: 48 mg/dL — ABNORMAL LOW (ref >=50)
LDL Cholesterol (Calc): 222 mg/dL (calc) — ABNORMAL HIGH
Non-HDL Cholesterol (Calc): 251 mg/dL (calc) — ABNORMAL HIGH (ref <130)
Total CHOL/HDL Ratio: 6.2 (calc) — ABNORMAL HIGH (ref <5.0)
Triglycerides: 135 mg/dL (ref <150)

## 2018-12-01 LAB — VITAMIN D 25 HYDROXY (VIT D DEFICIENCY, FRACTURES): Vit D, 25-Hydroxy: 21 ng/mL — ABNORMAL LOW (ref 30–100)

## 2018-12-01 LAB — HEMOGLOBIN A1C
HEMOGLOBIN A1C: 5.6 %{Hb} (ref <5.7)
Mean Plasma Glucose: 114 (calc)
eAG (mmol/L): 6.3 (calc)

## 2018-12-15 DIAGNOSIS — I480 Paroxysmal atrial fibrillation: Secondary | ICD-10-CM | POA: Diagnosis not present

## 2018-12-15 DIAGNOSIS — Z9889 Other specified postprocedural states: Secondary | ICD-10-CM | POA: Diagnosis not present

## 2019-02-03 ENCOUNTER — Telehealth: Payer: Self-pay | Admitting: Family Medicine

## 2019-02-03 NOTE — Telephone Encounter (Signed)
@  [facility] Chronic Care Management   Outreach Note  02/03/2019 Name: [name] MRN: [id number] DOB: [birth year]  Referred by: [name], MD Reason for referral : Chronic Care Management (Initial CCM outreach call was unsuccessful.)   An unsuccessful telephone outreach was attempted today. The patient was referred to the case management team by for assistance with chronic care management and care coordination.   Follow Up Plan: A HIPPA compliant phone message was left for the patient providing contact information and requesting a return call.  The CM team will reach out to the patient again over the next 7 days.  If patient returns call to provider office, please advise to call [place] at [place]  [email or web address]   ??[phone]

## 2019-02-07 NOTE — Telephone Encounter (Signed)
@  [facility] Chronic Care Management   Outreach Note  02/07/2019 Name: [name] MRN: [id number] DOB: Apr 24, 1941  Referred by: [name], MD Reason for referral : Chronic Care Management (Initial CCM outreach call was unsuccessful.) and Chronic Care Management (Second CCM outreach call was unsuccessful. )   An unsuccessful telephone outreach was attempted today. The patient was referred to the case management team by for assistance with chronic care management and care coordination.   Follow Up Plan: A HIPPA compliant phone message was left for the patient providing contact information and requesting a return call.  The care management team will reach out to the patient again over the next 7 days.  If patient returns call to provider office, please advise to call [place] at [place]  [email or web address]   ??[phone]

## 2019-02-15 NOTE — Telephone Encounter (Signed)
°  Chronic Care Management   Outreach Note  02/15/2019 Name: [name] MRN: [id number] DOB: 1941/06/18  Referred by: [name], MD Reason for referral : Chronic Care Management (Initial CCM outreach call was unsuccessful.); Chronic Care Management (Second CCM outreach call was unsuccessful. ); and Chronic Care Management (Third CCM outreach call was unsuccessful.)   Third unsuccessful telephone outreach was attempted today. The patient was referred to the case management team for assistance with chronic care management and care coordination. The patient's primary care provider has been notified of our unsuccessful attempts to make or maintain contact with the patient. The care management team is pleased to engage with this patient at any time in the future should he/she be interested in assistance from the care management team.   Follow Up Plan: The care management team is available to follow up with the patient after provider conversation with the patient regarding recommendation for care management engagement and subsequent re-referral to the care management team.   [place]  [email or web address]   ??[phone]

## 2019-02-15 NOTE — Chronic Care Management (AMB) (Deleted)
°  Chronic Care Management   Outreach Note  02/15/2019 Name: [name] MRN: [id number] DOB: Apr 13, 1941  Referred by: [name], MD Reason for referral : Chronic Care Management (Initial CCM outreach call was unsuccessful.); Chronic Care Management (Second CCM outreach call was unsuccessful. ); and Chronic Care Management (Third CCM outreach call was unsuccessful.)   Third unsuccessful telephone outreach was attempted today. The patient was referred to the case management team for assistance with chronic care management and care coordination. The patient's primary care provider has been notified of our unsuccessful attempts to make or maintain contact with the patient. The care management team is pleased to engage with this patient at any time in the future should he/she be interested in assistance from the care management team.   Follow Up Plan: The care management team is available to follow up with the patient after provider conversation with the patient regarding recommendation for care management engagement and subsequent re-referral to the care management team.   [place]  [email or web address]   ??[phone]

## 2019-02-21 DIAGNOSIS — Z9889 Other specified postprocedural states: Secondary | ICD-10-CM | POA: Diagnosis not present

## 2019-02-21 DIAGNOSIS — I480 Paroxysmal atrial fibrillation: Secondary | ICD-10-CM | POA: Diagnosis not present

## 2019-02-28 ENCOUNTER — Telehealth: Payer: Self-pay | Admitting: Family Medicine

## 2019-02-28 NOTE — Chronic Care Management (AMB) (Signed)
Chronic Care Management   Note  02/28/2019 Name: [name] MRN: [id number] DOB: [birth year]  [name] is a 78 y.o. year old female who is a primary care patient of [name], MD. I reached out to [name] by phone today in response to a referral sent by [name]'s health plan.    [name] was given information about Chronic Care Management services today including:  1. CCM service includes personalized support from designated clinical staff supervised by her physician, including individualized plan of care and coordination with other care providers 2. 24/7 contact phone numbers for assistance for urgent and routine care needs. 3. Service will only be billed when office clinical staff spend 20 minutes or more in a month to coordinate care. 4. Only one practitioner may furnish and bill the service in a calendar month. 5. The patient may stop CCM services at any time (effective at the end of the month) by phone call to the office staff. 6. The patient will be responsible for cost sharing (co-pay) of up to 20% of the service fee (after annual deductible is met).  Patient agreed to services and verbal consent obtained.   Follow up plan: Telephone appointment with CCM team member scheduled for: 03/09/2019  [place]  [email or web address]   ??[phone]

## 2019-03-09 ENCOUNTER — Ambulatory Visit: Payer: Medicare Other

## 2019-03-09 ENCOUNTER — Other Ambulatory Visit: Payer: Self-pay

## 2019-03-09 DIAGNOSIS — I5042 Chronic combined systolic (congestive) and diastolic (congestive) heart failure: Secondary | ICD-10-CM

## 2019-03-09 DIAGNOSIS — I517 Cardiomegaly: Secondary | ICD-10-CM

## 2019-03-09 NOTE — Chronic Care Management (AMB) (Signed)
Chronic Care Management   Initial Visit Note  03/11/2019 Name: [name] MRN: [id number] DOB: [birth year]  Subjective: "I work hard every day, take the best care of my health as I can, and I don't think I need to be followed by a nurse"  Objective:  BP Readings from Last 3 Encounters:  11/30/18 116/70  10/26/18 112/70  08/18/18 136/66   Lab Results  Component Value Date   HGBA1C 5.6 11/30/2018   Assessment: Ms. [name] is a 78 year old female patient of Dr. Steele Higgins who was referred to the Chronic Case Management Team by her health plan. She was scheduled for her initial assessment today but states she forgot. She request additional explaination of CCM Services which was provided to patients satisfaction. 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 She feels she does not need CCM services at this time.   RN CM was able to ask a few questions about her health prior to end of conversation which prompted patient to engage in conversation about her CHF. Patient continues to decline need for ongoing engagement with RN CM but request the ability to contact RN CM if needs arise. She states she will be receptive and appreciative of any reading materials RN CM can provide related to CHF.   Review of patient status, including review of consultants reports, relevant laboratory and other test results, and collaboration with appropriate care team members and the patient's provider was performed as part of comprehensive patient evaluation and provision of chronic care management services.     Advanced Directives 10/26/2018  Does Patient Have a Medical Advance Directive? No  Type of Advance Directive -  Does patient want to make changes to medical advance directive? -  Copy of [place] in Chart? -  Would patient like information on  creating a medical advance directive? No - Patient declined     Goals Addressed            This Visit's Progress   . I need to get new scales so I can weigh every day (pt-stated)        Ms. [name] feels she is in good health. She takes her medications as prescribed, remains active daily although she does not exercise. She does not weigh herself daily as she does not have a scale in her home. She admits to weight fluctuation as evidenced by intermittent lower extremity edema. She does not follow a low sodium diet as she states her swelling increases when she has french fries. She does not monitor her BP daily.  Current Barriers:  Marland Kitchen Knowledge deficit related to basic heart failure pathophysiology and self care management . Lack of scale in home  Nurse Case Manager Clinical Goal(s):   Over the next 14 days, patient will weigh self daily and record (after purchasing a scale)  Over the next 14 days, patient will verbalize understanding of Heart Failure Action Plan and when to call doctor  Over the next 14 days, patient will take all Heart Failure mediations as prescribed  Over the next 30 days, patient will call RN CM if any further assistance is needed  Interventions:  . Basic overview and discussion of pathophysiology of Heart Failure . Provided written and verbal education on low sodium diet . Reviewed Heart Failure Action Plan provided written copy .  Assessed for scales in home . Discussed importance of daily weight . Reviewed role of diuretics in prevention of fluid overload . Provided patient with RN CM contact information . Provided patient with resource for scales ([employer] for <30.00)  Patient Self Care Activities:  . Take Heart Failure Medications as prescribed . Weigh daily and record (notify MD with 3 lb weight gain over night or 5 lb in a week) . Follow CHF Action Plan . Adhere to low sodium diet  Initial goal documentation          Follow up plan:   Telephone follow up appointment with care management team member scheduled for: 14 days to make sure patient received educational materials via mail The patient has been provided with contact information for the care management team and has been advised to call with any health related questions or concerns.      [name] E. Kelly Rotunda, RN, BSN Nurse Care Coordinator [facility] Care Management  [phone]

## 2019-03-11 NOTE — Patient Instructions (Addendum)
Thank you allowing the Chronic Care Management Team to be a part of your care! It was a pleasure speaking with you today!  1. Continue to take all your medications as prescribed 2. Once you purchase a scale please weigh daily and record 3. If you gain 3 lbs over night or 5 lbs in a week, please call your cardiologist 4. I have included education on CHF and low sodium diet. It is important that you do not eat more than 1500 to 2000mg  of sodium a day. Extra sodium will call you to gain weight which makes your BP higher and your heart to work harder. 5. Please call me if you decide you need additional assistance/education for your chronic conditions such as Heart Failure, High blood pressure, and COPD  CCM (Chronic Care Management) Team   Trish Fountain RN, BSN Nurse Care Coordinator  [phone]  Ruben Reason PharmD  Clinical Pharmacist  [phone]   Nash, LCSW Clinical Social Worker [phone]    CHF Self-Health Management Activities:   6. Understand your heart failure (CHF) diagnosis and symptoms  Heart Failure (HFor CHF) Definition: When the heart muscle does not pump enough blood through the heart, leaving it very weak.   Some Heart Failure signs/symptoms:  . Shortness of breath with or without activities . Weakness . Swelling in your legs, ankles, feet, and/or abdomen . Sudden weight gain from fluid retention . Coughing or wheezing with possible mucus build up  Things you can do: Marland Kitchen Improve your quality of life by managing your stress levels, reducing the salt in your diet, exercise and lose weight if necessary.    2. Weigh yourself EVERY DAY in the morning after you go to the bathroom and with approximately the same amount of clothes on. Write it down.  . If you have gained 3 pounds overnight, call Dr. Clayborn Bigness . If you have gained 5 pounds in a week, call Dr. Clayborn Bigness   3. Record your zone each day and know your Heart Failure (CHF) Action Plan    HEART FAILURE ACTION PLAN Actions to Take if My Symptoms Get Worse   What ZONE are you in today? Green, Yellow, or Red?   GREEN ZONE: This is your goal  . No shortness of breath or trouble breathing. . No weight gain of more then 3 pounds in one day or 5 pounds in a week. Marland Kitchen No swelling in your feet, ankles, stomach, or hands. . No chest discomfort, heaviness, or pain .   YELLOW ZONE: Call your doctor TODAY to get help  You may have one or more of the following:  . Weight gain of 3 pounds in 1 day or 5 pounds in 1 week. . More swelling of your feet, ankles, stomach, or hands. . It is harder for you to breathe when lying down. You need to sit up.  . Chest discomfort, heaviness, or pain. . You feel more tired or have less energy than normal. . New or worsening dizziness. . Dry hacky cough. . You feel uneasy and you know something is not right.   RED ZONE: EMERGENCY CALL 911!  . Struggling to breathe. This does not go away when you sit up . Stronger and more regular amounts of chest discomfort.  . New confusion or cant think clearly.  . Fainting or near fainting.       Goals Addressed            This Visit's Progress   .  I need to get new scales so I can weigh every day (pt-stated)       Current Barriers:  Marland Kitchen Knowledge deficit related to basic heart failure pathophysiology and self care management . Lack of scale in home  Nurse Case Manager Clinical Goal(s):   Over the next 14 days, patient will weigh self daily and record (after purchasing a scale)  Over the next 14 days, patient will verbalize understanding of Heart Failure Action Plan and when to call doctor  Over the next 14 days, patient will take all Heart Failure mediations as prescribed  Over the next 30 days, patient will call RN CM if any further assistance is needed  Interventions:  . Basic overview and discussion of pathophysiology of Heart Failure . Provided written and verbal education on low sodium  diet . Reviewed Heart Failure Action Plan provided written copy . Assessed for scales in home . Discussed importance of daily weight . Reviewed role of diuretics in prevention of fluid overload . Provided patient with RN CM contact information  Patient Self Care Activities:  . Take Heart Failure Medications as prescribed . Weigh daily and record (notify MD with 3 lb weight gain over night or 5 lb in a week) . Follow CHF Action Plan . Adhere to low sodium diet  Initial goal documentation         Print copy of patient instructions provided.   The patient has been provided with contact information for the care management team and has been advised to call with any health related questions or concerns.   SYMPTOMS OF A STROKE   You have any symptoms of stroke. "BE FAST" is an easy way to remember the main warning signs: ? B - Balance. Signs are dizziness, sudden trouble walking, or loss of balance. ? E - Eyes. Signs are trouble seeing or a sudden change in how you see. ? F - Face. Signs are sudden weakness or loss of feeling of the face, or the face or eyelid drooping on one side. ? A - Arms. Signs are weakness or loss of feeling in an arm. This happens suddenly and usually on one side of the body. ? S - Speech. Signs are sudden trouble speaking, slurred speech, or trouble understanding what people say. ? T - Time. Time to call emergency services. Write down what time symptoms started.  You have other signs of stroke, such as: ? A sudden, very bad headache with no known cause. ? Feeling sick to your stomach (nausea). ? Throwing up (vomiting). ? Jerky movements you cannot control (seizure).  SYMPTOMS OF A HEART ATTACK  What are the signs or symptoms? Symptoms of this condition include:  Chest pain. It may feel like: ? Crushing or squeezing. ? Tightness, pressure, fullness, or heaviness.  Pain in the arm, neck, jaw, back, or upper body.  Shortness of  breath.  Heartburn.  Indigestion.  Nausea.  Cold sweats.  Feeling tired.  Sudden lightheadedness.   Living With Heart Failure  Heart failure is a long-term (chronic) condition in which the heart cannot pump enough blood through the body. When this happens, parts of the body do not get the blood and oxygen they need. There is no cure for heart failure at this time, so it is important for you to take good care of yourself and follow the treatment plan set by your health care provider. If you are living with heart failure, there are ways to help you manage the disease.  Follow these instructions at home: Living with heart failure requires you to make changes in your life. Your health care team will teach you about the changes you need to make in order to relieve your symptoms and lower your risk of going to the hospital. Follow the treatment plan as set by your health care provider. Medicines Medicines are important in reducing your heart's workload, slowing the progression of heart failure, and improving your symptoms. Take over-the-counter and prescription medicines only as told by your health care provider. Do not stop taking your medicine unless your health care provider tells you to do that. Do not skip any dose of your medicine. Refill prescriptions before you run out of medicine. You need your medicines every day. Eating and drinking  Eat heart-healthy foods. Talk with a dietitian to make an eating plan that is right for you. If directed by your health care provider: Limit salt (sodium). Lowering your sodium intake may reduce symptoms of heart failure. Ask a dietitian to recommend heart-healthy seasonings. Limit your fluid intake. Fluid restriction may reduce symptoms of heart failure. Use low-fat cooking methods instead of frying. Low-fat methods include roasting, grilling, broiling, baking, poaching, steaming, and stir-frying. Choose foods that contain no trans fat and are low  in saturated fat and cholesterol. Healthy choices include fresh or frozen fruits and vegetables, fish, lean meats, legumes, fat-free or low-fat dairy products, and whole-grain or high-fiber foods. Limit alcohol intake to no more than 1 drink a day for nonpregnant women and 2 drinks a day for men. One drink equals 12 oz of beer, 5 oz of wine, or 1 oz of hard liquor. Drinking more than that is harmful to your heart. Tell your health care provider if you drink alcohol several times a week. Talk with your health care provider about whether any level of alcohol use is safe for you. Activity  Ask your health care provider about attending cardiac rehabilitation. These programs include aerobic physical activity, which provides many benefits for your heart. If no cardiac rehabilitation program is available, ask your health care provider what aerobic exercises are safe for you to do. Lifestyle Make the lifestyle changes recommended by your health care provider. In general: Lose weight if your health care provider tells you to do that. Weight loss may reduce symptoms of heart failure. Do not use any products that contain nicotine or tobacco, such as cigarettes or e-cigarettes. If you need help quitting, ask your health care provider. Do not use street (illegal) drugs. Return to your normal activities as told by your health care provider. Ask your health care provider what activities are safe for you. General instructions  Make sure you weigh yourself every day to track your weight. Rapid weight gain may indicate an increase in fluid in your body and may increase the workload of your heart. Weigh yourself every morning. Do this after you urinate but before you eat breakfast. Wear the same type of clothing, without shoes, each time you weigh yourself. Weigh yourself on the same scale and in the same spot each time. Living with chronic heart failure often leads to emotions such as fear, stress, anxiety, and  depression. If you feel any of these emotions and need help coping, contact your health care provider. Other ways to get help include: Talking to friends and family members about your condition. They can give you support and guidance. Explain your symptoms to them and, if comfortable, invite them to attend appointments or rehabilitation with you. Joining  a support group for people with chronic heart failure. Talking with other people who have the same symptoms may give you new ways of coping with your disease and your emotions. Stay up to date with your shots (vaccines). Staying current on pneumococcal and influenza vaccines is especially important in preventing germs from attacking your airways (respiratory infections). Keep all follow-up visits as told by your health care provider. This is important. How to recognize changes in your condition You and your family members need to know what changes to watch for in your condition. Watch for the following changes and report them to your health care provider: Sudden weight gain. Ask your health care provider what amount of weight gain to report. Shortness of breath: Feeling short of breath while at rest, with no exercise or activity that required great effort. Feeling breathless with activity. Swelling of your lower legs or ankles. Difficulty sleeping: You wake up feeling short of breath. You have to use more pillows to raise your head in order to sleep. Frequent, dry, hacking cough. Loss of appetite. Feeling more tired all the time. Depression or feelings of sadness or hopelessness. Bloating in the stomach. Where to find more information Local support groups. Ask your health care provider about groups near you. The American Heart Association: www.heart.org Contact a health care provider if: You have a rapid weight gain. You have increasing shortness of breath that is unusual for you. You are unable to participate in your usual physical  activities. You tire easily. You cough more than normal, especially with physical activity. You have any swelling or more swelling in areas such as your hands, feet, ankles, or abdomen. You feel like your heart is beating quickly (palpitations). You become dizzy or light-headed when you stand up. Get help right away if: You have difficulty breathing. You notice or your family notices a change in your awareness, such as having trouble staying awake or having difficulty with concentration. You have pain or discomfort in your chest. You have an episode of fainting (syncope). Summary There is no cure for heart failure, so it is important for you to take good care of yourself and follow the treatment plan set by your health care provider. Medicines are important in reducing your heart's workload, slowing the progression of heart failure, and improving your symptoms. Living with chronic heart failure often leads to emotions such as fear, stress, anxiety, and depression. If you are feeling any of these emotions and need help coping, contact your health care provider.   Low-Sodium Eating Plan Sodium, which is an element that makes up salt, helps you maintain a healthy balance of fluids in your body. Too much sodium can increase your blood pressure and cause fluid and waste to be held in your body. Your health care provider or dietitian may recommend following this plan if you have high blood pressure (hypertension), kidney disease, liver disease, or heart failure. Eating less sodium can help lower your blood pressure, reduce swelling, and protect your heart, liver, and kidneys. What are tips for following this plan? General guidelines  Most people on this plan should limit their sodium intake to 1,500-2,000 mg (milligrams) of sodium each day. Reading food labels   The Nutrition Facts label lists the amount of sodium in one serving of the food. If you eat more than one serving, you must multiply  the listed amount of sodium by the number of servings.  Choose foods with less than 140 mg of sodium per serving.  Avoid foods with 300 mg of sodium or more per serving. Shopping  Look for lower-sodium products, often labeled as "low-sodium" or "no salt added."  Always check the sodium content even if foods are labeled as "unsalted" or "no salt added".  Buy fresh foods. ? Avoid canned foods and premade or frozen meals. ? Avoid canned, cured, or processed meats  Buy breads that have less than 80 mg of sodium per slice. Cooking  Eat more home-cooked food and less restaurant, buffet, and fast food.  Avoid adding salt when cooking. Use salt-free seasonings or herbs instead of table salt or sea salt. Check with your health care provider or pharmacist before using salt substitutes.  Cook with plant-based oils, such as canola, sunflower, or olive oil. Meal planning  When eating at a restaurant, ask that your food be prepared with less salt or no salt, if possible.  Avoid foods that contain MSG (monosodium glutamate). MSG is sometimes added to [place] food, bouillon, and some canned foods. What foods are recommended? The items listed may not be a complete list. Talk with your dietitian about what dietary choices are best for you. Grains Low-sodium cereals, including oats, puffed wheat and rice, and shredded wheat. Low-sodium crackers. Unsalted rice. Unsalted pasta. Low-sodium bread. Whole-grain breads and whole-grain pasta. Vegetables Fresh or frozen vegetables. "No salt added" canned vegetables. "No salt added" tomato sauce and paste. Low-sodium or reduced-sodium tomato and vegetable juice. Fruits Fresh, frozen, or canned fruit. Fruit juice. Meats and other protein foods Fresh or frozen (no salt added) meat, poultry, seafood, and fish. Low-sodium canned tuna and salmon. Unsalted nuts. Dried peas, beans, and lentils without added salt. Unsalted canned beans. Eggs. Unsalted nut  butters. Dairy Milk. Soy milk. Cheese that is naturally low in sodium, such as ricotta cheese, fresh mozzarella, or Swiss cheese Low-sodium or reduced-sodium cheese. Cream cheese. Yogurt. Fats and oils Unsalted butter. Unsalted margarine with no trans fat. Vegetable oils such as canola or olive oils. Seasonings and other foods Fresh and dried herbs and spices. Salt-free seasonings. Low-sodium mustard and ketchup. Sodium-free salad dressing. Sodium-free light mayonnaise. Fresh or refrigerated horseradish. Lemon juice. Vinegar. Homemade, reduced-sodium, or low-sodium soups. Unsalted popcorn and pretzels. Low-salt or salt-free chips. What foods are not recommended? The items listed may not be a complete list. Talk with your dietitian about what dietary choices are best for you. Grains Instant hot cereals. Bread stuffing, pancake, and biscuit mixes. Croutons. Seasoned rice or pasta mixes. Noodle soup cups. Boxed or frozen macaroni and cheese. Regular salted crackers. Self-rising flour. Vegetables Sauerkraut, pickled vegetables, and relishes. Olives. [place] fries. Onion rings. Regular canned vegetables (not low-sodium or reduced-sodium). Regular canned tomato sauce and paste (not low-sodium or reduced-sodium). Regular tomato and vegetable juice (not low-sodium or reduced-sodium). Frozen vegetables in sauces. Meats and other protein foods Meat or fish that is salted, canned, smoked, spiced, or pickled. Bacon, ham, sausage, hotdogs, corned beef, chipped beef, packaged lunch meats, salt pork, jerky, pickled herring, anchovies, regular canned tuna, sardines, salted nuts. Dairy Processed cheese and cheese spreads. Cheese curds. Blue cheese. Feta cheese. String cheese. Regular cottage cheese. Buttermilk. Canned milk. Fats and oils Salted butter. Regular margarine. Ghee. Bacon fat. Seasonings and other foods Onion salt, garlic salt, seasoned salt, table salt, and sea salt. Canned and packaged gravies.  Worcestershire sauce. Tartar sauce. Barbecue sauce. Teriyaki sauce. Soy sauce, including reduced-sodium. Steak sauce. Fish sauce. Oyster sauce. Cocktail sauce. Horseradish that you find on the shelf. Regular ketchup and mustard. Meat flavorings  and tenderizers. Bouillon cubes. Hot sauce and Tabasco sauce. Premade or packaged marinades. Premade or packaged taco seasonings. Relishes. Regular salad dressings. Salsa. Potato and tortilla chips. Corn chips and puffs. Salted popcorn and pretzels. Canned or dried soups. Pizza. Frozen entrees and pot pies. Summary  Eating less sodium can help lower your blood pressure, reduce swelling, and protect your heart, liver, and kidneys.  Most people on this plan should limit their sodium intake to 1,500-2,000 mg (milligrams) of sodium each day.  Canned, boxed, and frozen foods are high in sodium. Restaurant foods, fast foods, and pizza are also very high in sodium. You also get sodium by adding salt to food.  Try to cook at home, eat more fresh fruits and vegetables, and eat less fast food, canned, processed, or prepared foods.

## 2019-03-22 ENCOUNTER — Ambulatory Visit (INDEPENDENT_AMBULATORY_CARE_PROVIDER_SITE_OTHER): Payer: Medicare Other | Admitting: Family Medicine

## 2019-03-22 ENCOUNTER — Encounter: Payer: Self-pay | Admitting: Family Medicine

## 2019-03-22 ENCOUNTER — Other Ambulatory Visit: Payer: Self-pay

## 2019-03-22 VITALS — BP 120/64 | HR 76 | Temp 96.9°F | Resp 16 | Ht 64.0 in | Wt 184.6 lb

## 2019-03-22 DIAGNOSIS — Z9889 Other specified postprocedural states: Secondary | ICD-10-CM | POA: Diagnosis not present

## 2019-03-22 DIAGNOSIS — I480 Paroxysmal atrial fibrillation: Secondary | ICD-10-CM

## 2019-03-22 DIAGNOSIS — I5042 Chronic combined systolic (congestive) and diastolic (congestive) heart failure: Secondary | ICD-10-CM | POA: Diagnosis not present

## 2019-03-22 DIAGNOSIS — I10 Essential (primary) hypertension: Secondary | ICD-10-CM | POA: Diagnosis not present

## 2019-03-22 DIAGNOSIS — G4700 Insomnia, unspecified: Secondary | ICD-10-CM

## 2019-03-22 DIAGNOSIS — M109 Gout, unspecified: Secondary | ICD-10-CM | POA: Diagnosis not present

## 2019-03-22 DIAGNOSIS — E785 Hyperlipidemia, unspecified: Secondary | ICD-10-CM | POA: Diagnosis not present

## 2019-03-22 DIAGNOSIS — Z1211 Encounter for screening for malignant neoplasm of colon: Secondary | ICD-10-CM

## 2019-03-22 MED ORDER — ATORVASTATIN CALCIUM 80 MG PO TABS: 80.0000 mg | ORAL_TABLET | Freq: Every day | ORAL | 1 refills | Status: DC

## 2019-03-22 MED ORDER — ALLOPURINOL 300 MG PO TABS: 300.0000 mg | ORAL_TABLET | Freq: Every day | ORAL | 1 refills | Status: DC

## 2019-03-22 MED ORDER — TRAZODONE HCL 50 MG PO TABS: 50.0000 mg | ORAL_TABLET | Freq: Every day | ORAL | 0 refills | Status: DC

## 2019-03-22 MED ORDER — METOPROLOL TARTRATE 25 MG PO TABS: 25.0000 mg | ORAL_TABLET | Freq: Two times a day (BID) | ORAL | 1 refills | Status: DC

## 2019-03-22 MED ORDER — POTASSIUM CHLORIDE CRYS ER 20 MEQ PO TBCR: 20.0000 meq | EXTENDED_RELEASE_TABLET | Freq: Every day | ORAL | 1 refills | Status: DC

## 2019-03-22 MED ORDER — TORSEMIDE 20 MG PO TABS: 20.0000 mg | ORAL_TABLET | Freq: Every day | ORAL | 1 refills | Status: DC

## 2019-03-22 NOTE — Progress Notes (Addendum)
Name: [name]   MRN: [id number]    DOB: 1941/03/15   Date:03/22/2019       Progress Note  Subjective  Chief Complaint  Chief Complaint  Patient presents with  . Atrial Fibrillation  . Hypertension  . Hyperlipidemia  . Migraine    HPI  Chronic combined CHF: she is on Demadex and potassium,offdigoxinand beta-blocker . No chest , no SOB recently, she denies orthopnea. She is not on ACE because of cough, she was on Losartan for a period of time but bp dropped so cardiologist discontinued medications . She has regular follow up with Dr. Clayborn Bigness.   HTN: taking metoprolol, bp much better controlled since aortic valve replacement. No chest pain ,she states noSOBat this time.Doing well   Hyperlipidemia:she is not taking zetia daily because of cost, stopped medication,she has been taking half of Atorvastatin alternating with a full pill and we will recheck labs next visit . Last LDL was 222 but she was not taking medications regularly   Insomnia:shetakes Trazodone prn usually just half a pill , most nights she sleeps without medication.  Gout:no recent episodes, taking allopurinol daily.Unchanged   Dysmetabolic Syndrome: she is not very compliant with diet. She denies polyphagia, polyuria or polydipsiaLast hgbA1C was normal.   Afib: rate controlled, no fluttering sensation, taking betablocker andoffdigoxin, also on coumadin and goes to coumadin clinic/Dr. Callwood, she had to hold medication last week because she had a tooth pulled but will stop by at Dr. Etta Quill clinic today to recheck INR.She is on lopressor and denies side effects of medications   Chronic bronchitis : sheis taking Spiriva and takes it prn for wheezing or SOB. She states she has a productive cough in am's .Quit smoking in 2010. Unchanged   Depression:shehas a long history of depression, used to take SSRI's, symptoms now are intermittent and has been in remission for months, only  symptoms is difficulty sleeping and takes trazodone prn only . Shedenies suicidal thoughts or ideation. She is motivated, working part time and is feeling well   Obesity: she was doing well on life style modification, but has gained weight since last visit, but is willing to resume a healthier diet . She is cooking more at home and eating larger portions   Patient Active Problem List   Diagnosis Date Noted  . On Coumadin for atrial fibrillation ([place]) 06/30/2018  . Chronic anticoagulation 08/27/2017  . History of right breast cancer 08/27/2017  . Chronic obstructive pulmonary disease ([place]) 07/04/2015  . Controlled gout 06/06/2015  . Anxiety 02/09/2015  . Synovial cyst of popliteal space 02/09/2015  . Benign hypertension 02/09/2015  . Blood type A+ 02/09/2015  . Arterial branch occlusion of retina 02/09/2015  . Chronic constipation 02/09/2015  . Insomnia, persistent 02/09/2015  . Chronic combined systolic and diastolic CHF, NYHA class 1 ([place]) 02/09/2015  . Dyslipidemia 02/09/2015  . Atrial fibrillation ([place]) 02/09/2015  . Arthritis urica 02/09/2015  . Hearing loss 02/09/2015  . History of open heart surgery 02/09/2015  . Chronic hoarseness 02/09/2015  . Cardiomegaly 02/09/2015  . Dysmetabolic syndrome 78/67/6720  . Migraine with aura and without status migrainosus, not intractable 02/09/2015  . Adult BMI 30+ 02/09/2015  . Hypertensive pulmonary vascular disease ([place]) 02/09/2015  . Allergic rhinitis, seasonal 02/09/2015  . Vitamin D deficiency 02/09/2015  . History of mitral valve repair 10/01/2012  . History of aortic valve replacement 10/01/2012  . Congestive heart failure ([place]) 10/01/2012    Past Surgical History:  Procedure Laterality Date  .  ABDOMINAL HYSTERECTOMY  1996  . arm surgery  2019  . BREAST BIOPSY Right 1998   neg  . BREAST BIOPSY Right 2007   neg  . BREAST BIOPSY Right 1992   positive  . BREAST EXCISIONAL BIOPSY Left 2000   neg  . CARDIAC VALVE  REPLACEMENT  2014  . CATARACT EXTRACTION W/PHACO Left 03/30/2018   Procedure: CATARACT EXTRACTION PHACO AND INTRAOCULAR LENS PLACEMENT (IOC);  Surgeon: Birder Robson, MD;  Location: [facility] ORS;  Service: Ophthalmology;  Laterality: Left;  [place] 00:31.9 AP% 12.1 CDE 3.87 Fluid Pack lot # [id number]  . FRACTURE SURGERY Left    ARM/ LEG  . LEG SURGERY      Family History  Problem Relation Age of Onset  . Cancer Mother   . Diabetes Mother   . Hypertension Mother   . Breast cancer Mother 32  . Hypertension Father   . Heart failure Father   . Cancer Brother        bladder  . Cancer Sister        breast  . Breast cancer Sister 82  . Hypertension Son   . Hypertension Daughter   . Breast cancer Sister 55    Social History   Socioeconomic History  . Marital status: Widowed    Spouse name: Not on file  . Number of children: 2  . Years of education: Not on file  . Highest education level: 12th grade  Occupational History  . Occupation: cafeteria     Comment: [employer]  Social Needs  . Financial resource strain: Not hard at all  . Food insecurity    Worry: Never true    Inability: Never true  . Transportation needs    Medical: No    Non-medical: No  Tobacco Use  . Smoking status: Former Smoker    Quit date: 09/09/2007    Years since quitting: 11.5  . Smokeless tobacco: Never Used  Substance and Sexual Activity  . Alcohol use: No    Alcohol/week: 0.0 standard drinks  . Drug use: No  . Sexual activity: Not Currently  Lifestyle  . Physical activity    Days per week: 0 days    Minutes per session: 0 min  . Stress: Only a little  Relationships  . Social connections    Talks on phone: More than three times a week    Gets together: More than three times a week    Attends religious service: More than 4 times per year    Active member of club or organization: Yes    Attends meetings of clubs or organizations: More than 4 times per year    Relationship status: Widowed   . Intimate partner violence    Fear of current or ex partner: No    Emotionally abused: No    Physically abused: No    Forced sexual activity: No  Other Topics Concern  . Not on file  Social History Narrative  . Not on file     Current Outpatient Medications:  .  acetaminophen (TYLENOL) 500 MG tablet, Take 1,000-1,500 mg by mouth every 6 (six) hours as needed for mild pain., Disp: , Rfl:  .  allopurinol (ZYLOPRIM) 300 MG tablet, Take 1 tablet (300 mg total) by mouth daily., Disp: 90 tablet, Rfl: 1 .  atorvastatin (LIPITOR) 80 MG tablet, Take 1 tablet (80 mg total) by mouth at bedtime., Disp: 90 tablet, Rfl: 1 .  fluticasone (FLONASE) 50 MCG/ACT nasal spray, Place 2 sprays  into both nostrils daily., Disp: 16 g, Rfl: 6 .  metoprolol tartrate (LOPRESSOR) 25 MG tablet, Take 1 tablet (25 mg total) by mouth 2 (two) times daily., Disp: 180 tablet, Rfl: 1 .  potassium chloride SA (KLOR-CON M20) 20 MEQ tablet, Take 1 tablet (20 mEq total) by mouth daily., Disp: 90 tablet, Rfl: 1 .  tiotropium (SPIRIVA HANDIHALER) 18 MCG inhalation capsule, INHALE 1 CASPULE VIA HANDIHALER ONCE A DAY AT THE SAME TIME EVERY DAY (Patient taking differently: Place 18 mcg into inhaler and inhale daily as needed (WHEEZING). ), Disp: 90 capsule, Rfl: 1 .  torsemide (DEMADEX) 20 MG tablet, Take 1 tablet (20 mg total) by mouth daily., Disp: 90 tablet, Rfl: 1 .  traZODone (DESYREL) 50 MG tablet, Take 1 tablet (50 mg total) by mouth at bedtime., Disp: 90 tablet, Rfl: 0 .  warfarin (COUMADIN) 5 MG tablet, Take 5 mg by mouth daily., Disp: , Rfl:  .  Calcium Carbonate-Vitamin D 600-400 MG-UNIT tablet, Take 1 tablet by mouth 2 (two) times daily., Disp: , Rfl:   Allergies  Allergen Reactions  . Ace Inhibitors Cough and Other (See Comments)  . Amoxicillin-Pot Clavulanate Nausea And Vomiting  . Codeine Nausea And Vomiting  . Penicillins Nausea And Vomiting and Other (See Comments)    Has patient had a PCN reaction causing  immediate rash, facial/tongue/throat swelling, SOB or lightheadedness with hypotension: No Has patient had a PCN reaction causing severe rash involving mucus membranes or skin necrosis: No Has patient had a PCN reaction that required hospitalization No Has patient had a PCN reaction occurring within the last 10 years: No If all of the above answers are "NO", then may proceed with Cephalosporin use.  . Rosuvastatin Other (See Comments)    Reaction:  Joint pain   . Tetanus-Diphtheria Toxoids Td Swelling    I personally reviewed active problem list, medication list, allergies, family history, social history with the patient/caregiver today.   ROS  Constitutional: Negative for fever or weight change.  Respiratory: Negative for cough and shortness of breath.   Cardiovascular: Negative for chest pain or palpitations.  Gastrointestinal: Negative for abdominal pain, no bowel changes.  Musculoskeletal: Negative for gait problem or joint swelling.  Skin: Negative for rash.  Neurological: Negative for dizziness or headache.  No other specific complaints in a complete review of systems (except as listed in HPI above).   Objective  Vitals:   03/22/19 1418  BP: 120/64  Pulse: 76  Resp: 16  Temp: (!) 96.9 F (36.1 C)  TempSrc: Oral  SpO2: 97%  Weight: 184 lb 9.6 oz (83.7 kg)  Height: 5\' 4"  (1.626 m)    Body mass index is 31.69 kg/m.  Physical Exam  Constitutional: Patient appears well-developed and well-nourished. Obese  No distress.  HEENT: head atraumatic, normocephalic, pupils equal and reactive to light,  neck supple Cardiovascular: Normal rate, regular rhythm ( not in afib), Systolic murmur 2/6 Trace BLE edema. Pulmonary/Chest: Effort normal and breath sounds normal. No respiratory distress. Abdominal: Soft.  There is no tenderness. Psychiatric: Patient has a normal mood and affect. behavior is normal. Judgment and thought content normal.  PHQ2/9: Depression screen [facility] 2/9  03/22/2019 11/30/2018 10/26/2018 08/18/2018 05/13/2018  Decreased Interest 0 0 0 0 0  Down, Depressed, Hopeless 0 0 0 0 0  PHQ - 2 Score 0 0 0 0 0  Altered sleeping 0 2 - 0 0  Tired, decreased energy 0 0 - 0 0  Change in appetite 0  1 - 0 0  Feeling bad or failure about yourself  0 0 - 0 0  Trouble concentrating 0 0 - 0 0  Moving slowly or fidgety/restless 0 0 - 0 0  Suicidal thoughts 0 0 - 0 0  PHQ-9 Score 0 3 - 0 0  Difficult doing work/chores - Somewhat difficult - Not difficult at all Not difficult at all  Some recent data might be hidden    phq 9 is negative  Fall Risk: Fall Risk  03/22/2019 11/30/2018 10/26/2018 08/18/2018 08/12/2018  Falls in the past year? 0 0 0 0 0  Number falls in past yr: 0 0 0 0 -  Injury with Fall? 0 0 0 0 -  Comment - - - - -  Risk for fall due to : - - History of fall(s) - -  Follow up - - Falls prevention discussed - -     Functional Status Survey: Is the patient deaf or have difficulty hearing?: No Does the patient have difficulty seeing, even when wearing glasses/contacts?: No Does the patient have difficulty concentrating, remembering, or making decisions?: No Does the patient have difficulty walking or climbing stairs?: No Does the patient have difficulty dressing or bathing?: No Does the patient have difficulty doing errands alone such as visiting a doctor's office or shopping?: No    Assessment & Plan  1. Chronic combined systolic and diastolic CHF, NYHA class 1 (HCC)  - potassium chloride SA (KLOR-CON M20) 20 MEQ tablet; Take 1 tablet (20 mEq total) by mouth daily.  Dispense: 90 tablet; Refill: 1 - torsemide (DEMADEX) 20 MG tablet; Take 1 tablet (20 mg total) by mouth daily.  Dispense: 90 tablet; Refill: 1  2. Colon cancer screening  - Cologuard  3. Controlled gout  - allopurinol (ZYLOPRIM) 300 MG tablet; Take 1 tablet (300 mg total) by mouth daily.  Dispense: 90 tablet; Refill: 1  4. Dyslipidemia  - atorvastatin (LIPITOR) 80 MG  tablet; Take 1 tablet (80 mg total) by mouth at bedtime.  Dispense: 90 tablet; Refill: 1  5. Paroxysmal atrial fibrillation (HCC)  - metoprolol tartrate (LOPRESSOR) 25 MG tablet; Take 1 tablet (25 mg total) by mouth 2 (two) times daily.  Dispense: 180 tablet; Refill: 1  6. Benign hypertension  - torsemide (DEMADEX) 20 MG tablet; Take 1 tablet (20 mg total) by mouth daily.  Dispense: 90 tablet; Refill: 1  7. Insomnia, unspecified type  - traZODone (DESYREL) 50 MG tablet; Take 1 tablet (50 mg total) by mouth at bedtime.  Dispense: 90 tablet; Refill: 0

## 2019-03-25 ENCOUNTER — Ambulatory Visit: Payer: Self-pay

## 2019-03-25 ENCOUNTER — Telehealth: Payer: Self-pay

## 2019-03-25 DIAGNOSIS — I5042 Chronic combined systolic (congestive) and diastolic (congestive) heart failure: Secondary | ICD-10-CM

## 2019-03-25 DIAGNOSIS — E785 Hyperlipidemia, unspecified: Secondary | ICD-10-CM

## 2019-03-25 NOTE — Chronic Care Management (AMB) (Signed)
   Chronic Care Management   Unsuccessful Call Note 03/25/2019 Name: CAELIE REMSBURG MRN: [id number] DOB: 1941-05-23  Ms. [name] is a 78 year old female patient of Dr. [name] who was referred to the Chronic Case Management Team by her health plan. When contacted 03/09/2019, Ms. Deats stated she was doing well and did not need CCM Services. RN CM did provide patient with educational materials via mail per her request.   Was unable to reach patient via telephone today for follow up to make sure she received mailed materials. I have left HIPAA compliant voicemail asking patient to return my call. (unsuccessful outreach #1).   Plan: Will follow-up within 7 business days via telephone.      Parissa Chiao E. Rollene Rotunda, RN, BSN Nurse Care Coordinator [facility] Care Management  [phone]

## 2019-03-30 DIAGNOSIS — I480 Paroxysmal atrial fibrillation: Secondary | ICD-10-CM | POA: Diagnosis not present

## 2019-03-30 DIAGNOSIS — Z9889 Other specified postprocedural states: Secondary | ICD-10-CM | POA: Diagnosis not present

## 2019-04-01 ENCOUNTER — Other Ambulatory Visit: Payer: Self-pay

## 2019-04-01 ENCOUNTER — Ambulatory Visit: Payer: Medicare Other

## 2019-04-01 NOTE — Chronic Care Management (AMB) (Signed)
  Chronic Care Management   Follow Up Note   04/05/2019 Name: [name] MRN: [id number] DOB: 1941-05-16  Referred by: [name], MD Reason for referral : Chronic Care Management (follow up)   Subjective: "I really want to loose some of this weight"   Objective:  Wt Readings from Last 3 Encounters:  03/22/19 184 lb 9.6 oz (83.7 kg)  11/30/18 176 lb 9.6 oz (80.1 kg)  10/26/18 183 lb 4.8 oz (83.1 kg)       Assessment: Ms. [name] is a 78 year old female patient of Dr. [name] who was referred to the Chronic Case Management Team by her health plan. Today RN CM followed up with patient to ensure she received previously mailed education on HF as she did not want to engage with RN CM. She continue to state she does not need services but wishes to discuss ways to reduce her weight. She request information to be provided via mail.  Review of patient status, including review of consultants reports, relevant laboratory and other test results, and collaboration with appropriate care team members and the patient's provider was performed as part of comprehensive patient evaluation and provision of chronic care management services.    Goals Addressed            This Visit's Progress   . I need to get some of this weight off me (pt-stated)       Current Barriers:  Marland Kitchen Knowledge Deficits related to how to successfully loose weight . Busy lifestyle/work  Nurse Case Manager Clinical Goal(s):  Marland Kitchen Over the next 14 days, patient will begin to reduce caloric intake and increase daily activity/exercise to promote healthy weight loss  Interventions:  . Assessed for readiness to loose weight . Provided patient with healthy weight loss strategies including eating balanced meals, reducing portion sizes, cutting out empty calories, not skipping meals, and adding exercise to her daily activities . Discussed healthy meal supplements for "grab and go" lifestyle  Patient Self Care  Activities:  . Self administers medications as prescribed . Attends all scheduled provider appointments . Calls pharmacy for medication refills . Attends church or other social activities . Performs ADL's independently . Performs IADL's independently . Calls provider office for new concerns or questions . Follow provided plan of care to promote healthy weight loss  Initial goal documentation         Telephone follow up appointment with care management team member scheduled for: next week to assess for receipt of educational materials.     Raylee Adamec E. Rollene Rotunda, RN, BSN Nurse Care Coordinator [facility] Care Management  [phone]

## 2019-04-04 ENCOUNTER — Ambulatory Visit: Payer: Medicare Other | Admitting: Family Medicine

## 2019-04-05 NOTE — Patient Instructions (Addendum)
Thank you allowing the Chronic Care Management Team to be a part of your care! It was a pleasure speaking with you today!  1. Make sure you are not skipping meals 2. Please read the information provided to you below. You should find it helpful to plan healthy weight loss  3. You may find it helpful to use "grab and go" meal replacements that are low calories to promote metabolism stability. Your metabolism slows down when you skip meals. A good choice is [employer] brand shakes. 4. Continue to take all your medications as prescribed.  CCM (Chronic Care Management) Team   Trish Fountain RN, BSN Nurse Care Coordinator  [phone]  Ruben Reason PharmD  Clinical Pharmacist  [phone]   Elliot Gurney, LCSW Clinical Social Worker [phone]  Goals Addressed            This Visit's Progress   . I need to get some of this weight off me (pt-stated)       Current Barriers:  Marland Kitchen Knowledge Deficits related to how to successfully loose weight . Busy lifestyle/work  Nurse Case Manager Clinical Goal(s):  Marland Kitchen Over the next 14 days, patient will begin to reduce caloric intake and increase daily activity/exercise to promote healthy weight loss  Interventions:  . Assessed for readiness to loose weight . Provided patient with healthy weight loss strategies including eating balanced meals, reducing portion sizes, cutting out empty calories, not skipping meals, and adding exercise to her daily activities . Discussed healthy meal supplements for "grab and go" lifestyle  Patient Self Care Activities:  . Self administers medications as prescribed . Attends all scheduled provider appointments . Calls pharmacy for medication refills . Attends church or other social activities . Performs ADL's independently . Performs IADL's independently . Calls provider office for new concerns or questions . Follow provided plan of care to promote healthy weight loss  Initial goal documentation         Print copy of patient instructions provided.   Telephone follow up appointment with care management team member scheduled for: 1 week  SYMPTOMS OF A STROKE   You have any symptoms of stroke. "BE FAST" is an easy way to remember the main warning signs: ? B - Balance. Signs are dizziness, sudden trouble walking, or loss of balance. ? E - Eyes. Signs are trouble seeing or a sudden change in how you see. ? F - Face. Signs are sudden weakness or loss of feeling of the face, or the face or eyelid drooping on one side. ? A - Arms. Signs are weakness or loss of feeling in an arm. This happens suddenly and usually on one side of the body. ? S - Speech. Signs are sudden trouble speaking, slurred speech, or trouble understanding what people say. ? T - Time. Time to call emergency services. Write down what time symptoms started.  You have other signs of stroke, such as: ? A sudden, very bad headache with no known cause. ? Feeling sick to your stomach (nausea). ? Throwing up (vomiting). ? Jerky movements you cannot control (seizure).  SYMPTOMS OF A HEART ATTACK  What are the signs or symptoms? Symptoms of this condition include:  Chest pain. It may feel like: ? Crushing or squeezing. ? Tightness, pressure, fullness, or heaviness.  Pain in the arm, neck, jaw, back, or upper body.  Shortness of breath.  Heartburn.  Indigestion.  Nausea.  Cold sweats.  Feeling tired.  Sudden lightheadedness.   Calorie Counting  for Weight Loss Calories are units of energy. Your body needs a certain amount of calories from food to keep you going throughout the day. When you eat more calories than your body needs, your body stores the extra calories as fat. When you eat fewer calories than your body needs, your body burns fat to get the energy it needs. Calorie counting means keeping track of how many calories you eat and drink each day. Calorie counting can be helpful if you need to lose weight.  If you make sure to eat fewer calories than your body needs, you should lose weight. Ask your health care provider what a healthy weight is for you. For calorie counting to work, you will need to eat the right number of calories in a day in order to lose a healthy amount of weight per week. A dietitian can help you determine how many calories you need in a day and will give you suggestions on how to reach your calorie goal.  A healthy amount of weight to lose per week is usually 1-2 lb (0.5-0.9 kg). This usually means that your daily calorie intake should be reduced by 500-750 calories.  Eating 1,200 - 1,500 calories per day can help most women lose weight.  Eating 1,500 - 1,800 calories per day can help most men lose weight.  What do I need to know about calorie counting? In order to meet your daily calorie goal, you will need to:  Find out how many calories are in each food you would like to eat. Try to do this before you eat.  Decide how much of the food you plan to eat.  Write down what you ate and how many calories it had. Doing this is called keeping a food log. To successfully lose weight, it is important to balance calorie counting with a healthy lifestyle that includes regular activity. Aim for 150 minutes of moderate exercise (such as walking) or 75 minutes of vigorous exercise (such as running) each week. Where do I find calorie information?  The number of calories in a food can be found on a Nutrition Facts label. If a food does not have a Nutrition Facts label, try to look up the calories online or ask your dietitian for help. Remember that calories are listed per serving. If you choose to have more than one serving of a food, you will have to multiply the calories per serving by the amount of servings you plan to eat. For example, the label on a package of bread might say that a serving size is 1 slice and that there are 90 calories in a serving. If you eat 1 slice, you will have  eaten 90 calories. If you eat 2 slices, you will have eaten 180 calories. How do I keep a food log? Immediately after each meal, record the following information in your food log:  What you ate. Don't forget to include toppings, sauces, and other extras on the food.  How much you ate. This can be measured in cups, ounces, or number of items.  How many calories each food and drink had.  The total number of calories in the meal. Keep your food log near you, such as in a small notebook in your pocket, or use a mobile app or website. Some programs will calculate calories for you and show you how many calories you have left for the day to meet your goal. What are some calorie counting tips?   Use your  calories on foods and drinks that will fill you up and not leave you hungry: ? Some examples of foods that fill you up are nuts and nut butters, vegetables, lean proteins, and high-fiber foods like whole grains. High-fiber foods are foods with more than 5 g fiber per serving. ? Drinks such as sodas, specialty coffee drinks, alcohol, and juices have a lot of calories, yet do not fill you up.  Eat nutritious foods and avoid empty calories. Empty calories are calories you get from foods or beverages that do not have many vitamins or protein, such as candy, sweets, and soda. It is better to have a nutritious high-calorie food (such as an avocado) than a food with few nutrients (such as a bag of chips).  Know how many calories are in the foods you eat most often. This will help you calculate calorie counts faster.  Pay attention to calories in drinks. Low-calorie drinks include water and unsweetened drinks.  Pay attention to nutrition labels for "low fat" or "fat free" foods. These foods sometimes have the same amount of calories or more calories than the full fat versions. They also often have added sugar, starch, or salt, to make up for flavor that was removed with the fat.  Find a way of tracking  calories that works for you. Get creative. Try different apps or programs if writing down calories does not work for you.  What are some portion control tips?  Know how many calories are in a serving. This will help you know how many servings of a certain food you can have.  Use a measuring cup to measure serving sizes. You could also try weighing out portions on a kitchen scale. With time, you will be able to estimate serving sizes for some foods.  Take some time to put servings of different foods on your favorite plates, bowls, and cups so you know what a serving looks like.  Try not to eat straight from a bag or box. Doing this can lead to overeating. Put the amount you would like to eat in a cup or on a plate to make sure you are eating the right portion.  Use smaller plates, glasses, and bowls to prevent overeating. (This works great!!)  Try not to multitask (for example, watch TV or use your computer) while eating. If it is time to eat, sit down at a table and enjoy your food. This will help you to know when you are full. It will also help you to be aware of what you are eating and how much you are eating.  What are tips for following this plan? Reading food labels  Check the calorie count compared to the serving size. The serving size may be smaller than what you are used to eating.  Check the source of the calories. Make sure the food you are eating is high in vitamins and protein and low in saturated and trans fats. Shopping  Read nutrition labels while you shop. This will help you make healthy decisions before you decide to purchase your food.  Make a grocery list and stick to it. Cooking  Try to cook your favorite foods in a healthier way. For example, try baking instead of frying.  Use low-fat dairy products. Meal planning  Use more fruits and vegetables. Half of your plate should be fruits and vegetables.  Include lean proteins like poultry and fish.  How do I count  calories when eating out?  Ask for smaller portion  sizes.  Consider sharing an entree and sides instead of getting your own entree.  If you get your own entree, eat only half. Ask for a box at the beginning of your meal and put the rest of your entree in it so you are not tempted to eat it.  If calories are listed on the menu, choose the lower calorie options.  Choose dishes that include vegetables, fruits, whole grains, low-fat dairy products, and lean protein.  Choose items that are boiled, broiled, grilled, or steamed. Stay away from items that are buttered, battered, fried, or served with cream sauce. Items labeled "crispy" are usually fried, unless stated otherwise.  Choose water, low-fat milk, unsweetened iced tea, or other drinks without added sugar. If you want an alcoholic beverage, choose a lower calorie option such as a glass of wine or light beer.  Ask for dressings, sauces, and syrups on the side. These are usually high in calories, so you should limit the amount you eat.  If you want a salad, choose a garden salad and ask for grilled meats. Avoid extra toppings like bacon, cheese, or fried items. Ask for the dressing on the side, or ask for olive oil and vinegar or lemon to use as dressing.  Estimate how many servings of a food you are given. For example, a serving of cooked rice is  cup or about the size of half a baseball. Knowing serving sizes will help you be aware of how much food you are eating at restaurants.    The list below tells you how big or small some common portion sizes are based on everyday objects: ? 1 oz-4 stacked dice. ? 3 oz-1 deck of cards. ? 1 tsp-1 die. ? 1 Tbsp- a ping-pong ball. ? 2 Tbsp-1 ping-pong ball. ?  cup- baseball. ? 1 cup-1 baseball.  Summary  Calorie counting means keeping track of how many calories you eat and drink each day. If you eat fewer calories than your body needs, you should lose weight.  A healthy amount of weight to  lose per week is usually 1-2 lb (0.5-0.9 kg). This usually means reducing your daily calorie intake by 500-750 calories.  The number of calories in a food can be found on a Nutrition Facts label. If a food does not have a Nutrition Facts label, try to look up the calories online or ask your dietitian for help.  Use your calories on foods and drinks that will fill you up, and not on foods and drinks that will leave you hungry.  Use smaller plates, glasses, and bowls to prevent overeating. This information is not intended to replace advice given to you by your health care provider. Make sure you discuss any questions you have with your health care provider. Document Released: 08/25/2005 Document Revised: 05/14/2018 Document Reviewed: 07/25/2016 Elsevier Patient Education  2020 [employer].

## 2019-04-08 ENCOUNTER — Other Ambulatory Visit: Payer: Self-pay

## 2019-04-08 ENCOUNTER — Ambulatory Visit: Payer: Medicare Other

## 2019-04-08 NOTE — Chronic Care Management (AMB) (Signed)
  Chronic Care Management   Follow Up Note   04/09/2019 Name: [name] MRN: [id number] DOB: Apr 07, 1941  Referred by: [name], MD Reason for referral : Chronic Care Management (follow up diet)   Subjective: "I have been to the grocery store and purchased some meal replacement shakes and I am going to start my own weight loss program on Monday"  Objective:  Assessment:  Ms. [name] is a 78 year old female patient of Dr. [name] who was referred to the Chronic Case Management Team by her health plan. She originally felt she did not need any health education or follow up however she now wishes to engage to discuss weight loss and the need for ongoing support.   Review of patient status, including review of consultants reports, relevant laboratory and other test results, and collaboration with appropriate care team members and the patient's provider was performed as part of comprehensive patient evaluation and provision of chronic care management services.    Goals Addressed            This Visit's Progress   . I need to get some of this weight off me (pt-stated)   On track    Ms. [name] is very excited about learning how to successfully loose weight and being supported by CCM RN CM. She has been provided with weight loss educational materials and admits to cleaning out her refrigerator and going to the grocery to purchase more healthy, low calorie foods. 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 She will use this weekend to eat with family and friends and has set a goal to begin calorie reductions and exercise starting Monday. RN CM will follow up with her next week to provide support.   Current Barriers:  [name] Knowledge Deficits related to how to successfully loose weight . Busy lifestyle/work  Nurse Case Manager Clinical Goal(s):  [name]  Over the next 14 days, patient will begin to reduce caloric intake and increase daily activity/exercise to promote healthy weight loss  Interventions:  . Assessed for readiness to loose weight . Provided patient with healthy weight loss strategies including eating balanced meals, reducing portion sizes, cutting out empty calories, not skipping meals, and adding exercise to her daily activities . Discussed healthy meal supplements for "grab and go" lifestyle . Assessed for receipt of previously mailed educational materials  Patient Self Care Activities:  . Self administers medications as prescribed . Attends all scheduled provider appointments . Calls pharmacy for medication refills . Attends church or other social activities . Performs ADL's independently . Performs IADL's independently . Calls provider office for new concerns or questions . Follow provided plan of care to promote healthy weight loss  Please see past updates related to this goal by clicking on the "Past Updates" button in the selected goal          Telephone follow up appointment with care management team member scheduled for: 1 week     [name] Rotunda, RN, BSN Nurse Care Coordinator [facility] Care Management  [phone]

## 2019-04-09 NOTE — Patient Instructions (Addendum)
Thank you allowing the Chronic Care Management Team to be a part of your care! It was a pleasure speaking with you today!   You have set a personal goal to eat healthier and reduce your calories starting Monday.   Continue to take all your medications as prescribed  DO NOT SKIP MEALS...your body needs fuel for energy  Reduce your portion sizes (use a smaller plate and fill it up)  If you are hungry between meals, drink water, go for a walk   CCM (Chronic Care Management) Team   Trish Fountain RN, BSN Nurse Care Coordinator  [phone]  Ruben Reason PharmD  Clinical Pharmacist  [phone]   [place], [place] Social Worker [phone]  Goals Addressed            This Visit's Progress   . I need to get some of this weight off me (pt-stated)   On track    Current Barriers:  Marland Kitchen Knowledge Deficits related to how to successfully loose weight . Busy lifestyle/work  Nurse Case Manager Clinical Goal(s):  Marland Kitchen Over the next 14 days, patient will begin to reduce caloric intake and increase daily activity/exercise to promote healthy weight loss  Interventions:  . Assessed for readiness to loose weight . Provided patient with healthy weight loss strategies including eating balanced meals, reducing portion sizes, cutting out empty calories, not skipping meals, and adding exercise to her daily activities . Discussed healthy meal supplements for "grab and go" lifestyle . Assessed for receipt of previously mailed educational materials  Patient Self Care Activities:  . Self administers medications as prescribed . Attends all scheduled provider appointments . Calls pharmacy for medication refills . Attends church or other social activities . Performs ADL's independently . Performs IADL's independently . Calls provider office for new concerns or questions . Follow provided plan of care to promote healthy weight loss  Please see past updates related to this goal  by clicking on the "Past Updates" button in the selected goal         The patient verbalized understanding of instructions provided today and declined a print copy of patient instruction materials.   Telephone follow up appointment with care management team member scheduled for:  1 week SYMPTOMS OF A STROKE   You have any symptoms of stroke. "BE FAST" is an easy way to remember the main warning signs: ? B - Balance. Signs are dizziness, sudden trouble walking, or loss of balance. ? E - Eyes. Signs are trouble seeing or a sudden change in how you see. ? F - Face. Signs are sudden weakness or loss of feeling of the face, or the face or eyelid drooping on one side. ? A - Arms. Signs are weakness or loss of feeling in an arm. This happens suddenly and usually on one side of the body. ? S - Speech. Signs are sudden trouble speaking, slurred speech, or trouble understanding what people say. ? T - Time. Time to call emergency services. Write down what time symptoms started.  You have other signs of stroke, such as: ? A sudden, very bad headache with no known cause. ? Feeling sick to your stomach (nausea). ? Throwing up (vomiting). ? Jerky movements you cannot control (seizure).  SYMPTOMS OF A HEART ATTACK  What are the signs or symptoms? Symptoms of this condition include:  Chest pain. It may feel like: ? Crushing or squeezing. ? Tightness, pressure, fullness, or heaviness.  Pain in the arm, neck,  jaw, back, or upper body.  Shortness of breath.  Heartburn.  Indigestion.  Nausea.  Cold sweats.  Feeling tired.  Sudden lightheadedness.

## 2019-04-12 ENCOUNTER — Other Ambulatory Visit: Payer: Self-pay | Admitting: Family Medicine

## 2019-04-12 DIAGNOSIS — J410 Simple chronic bronchitis: Secondary | ICD-10-CM

## 2019-04-19 ENCOUNTER — Other Ambulatory Visit: Payer: Self-pay | Admitting: Family Medicine

## 2019-04-19 DIAGNOSIS — J410 Simple chronic bronchitis: Secondary | ICD-10-CM

## 2019-04-25 ENCOUNTER — Ambulatory Visit: Payer: Self-pay

## 2019-04-25 DIAGNOSIS — E785 Hyperlipidemia, unspecified: Secondary | ICD-10-CM

## 2019-04-25 NOTE — Chronic Care Management (AMB) (Signed)
Chronic Care Management   Follow Up Note   04/25/2019 Name: [name] MRN: [id number] DOB: 12-31-1940  Referred by: [name], MD Reason for referral : Chronic Care Management (follow up diet/weightloss)   Subjective: "I am so excited about my weight loss and my healthy eating plan"   Objective:  Assessment: Ms. [name] is a 78 year old female patient of Dr. [name] who was referred to the Chronic Case Management Team by her health plan. She originally felt she did not need any health education or follow up however she now wishes to engage to discuss weight loss and the need for ongoing support.   Review of patient status, including review of consultants reports, relevant laboratory and other test results, and collaboration with appropriate care team members and the patient's provider was performed as part of comprehensive patient evaluation and provision of chronic care management services.    Goals Addressed            This Visit's Progress   . COMPLETED: I need to get new scales so I can weigh every day (pt-stated)       Current Barriers:  Marland Kitchen Knowledge deficit related to basic heart failure pathophysiology and self care management . Lack of scale in home  Nurse Case Manager Clinical Goal(s):   Over the next 14 days, patient will weigh self daily and record (after purchasing a scale)  Over the next 14 days, patient will verbalize understanding of Heart Failure Action Plan and when to call doctor  Over the next 14 days, patient will take all Heart Failure mediations as prescribed  Over the next 30 days, patient will call RN CM if any further assistance is needed  Interventions:  . Basic overview and discussion of pathophysiology of Heart Failure . Provided written and verbal education on low sodium diet . Reviewed Heart Failure Action Plan provided written copy . Assessed for scales in home . Discussed importance of daily weight . Reviewed  role of diuretics in prevention of fluid overload . Provided patient with RN CM contact information  Patient Self Care Activities:  . Take Heart Failure Medications as prescribed . Weigh daily and record (notify MD with 3 lb weight gain over night or 5 lb in a week) . Follow CHF Action Plan . Adhere to low sodium diet  Initial goal documentation      . I need to get some of this weight off me (pt-stated)       Ms. [name] is very excited to report she has lost 4 lbs since our last telephone encounter. 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 She is no longer skipping meals. She has a low carb, nutritional shake for breakfast [employer] brand) as it is an easy grab and go item. She has a large salad with protein for lunch, and a shake for dinner as she is usually not hungry. She states she feels great and is motivated to improve her health. She plans to start walking with her coworkers next week.   Current Barriers:  Marland Kitchen Knowledge Deficits related to how to successfully loose weight . Busy lifestyle/work  Nurse Case Manager Clinical Goal(s):  Marland Kitchen Over the next 14 days, patient will begin to reduce caloric intake and increase daily activity/exercise to promote healthy weight loss-goal met 04/25/2019 . Over the next 60 days, patient will continue to reduce her caloric intake and increase daily activity/exercise to promote healthy weightloss  Interventions:  .  Discussed adherence with healthy weight loss plan . Assessed for weight loss . Reviewed nutrition/food diary . Provided support and congratulated patient for weight loss success   Patient Self Care Activities:  . Self administers medications as prescribed . Attends all scheduled provider appointments . Calls pharmacy for medication refills . Attends church or other social activities . Performs ADL's independently . Performs IADL's independently . Calls provider office for new concerns or questions .  Follow provided plan of care to promote healthy weight loss  Please see past updates related to this goal by clicking on the "Past Updates" button in the selected goal          The care management team will reach out to the patient again over the next 60 days.  The patient has been provided with contact information for the care management team and has been advised to call with any health related questions or concerns.     [name], RN, BSN Nurse Care Coordinator [facility] Care Management  [phone]

## 2019-04-25 NOTE — Patient Instructions (Signed)
Thank you allowing the Chronic Care Management Team to be a part of your care! It was a pleasure speaking with you today!  1. You are doing a great job following your healthy eating/ healthy weightloss plan. I am so proud of you. Keep up the good work. 2. You should try to incorporate a little walking in your day. During your lunch break is a great time. You do not have to walk for a long period of time. Any additional walking will benefit your health. 5 or 10 min here and there is much better than none at all during the day.  CCM (Chronic Care Management) Team   Portia Payne RN, BSN Nurse Care Coordinator  [phone]  Julie Hedrick PharmD  Clinical Pharmacist  [phone]   Chrystal Land, LCSW Clinical Social Worker [phone]  Goals Addressed            This Visit's Progress   . COMPLETED: I need to get new scales so I can weigh every day (pt-stated)       Current Barriers:  . Knowledge deficit related to basic heart failure pathophysiology and self care management . Lack of scale in home  Nurse Case Manager Clinical Goal(s):   Over the next 14 days, patient will weigh self daily and record (after purchasing a scale)  Over the next 14 days, patient will verbalize understanding of Heart Failure Action Plan and when to call doctor  Over the next 14 days, patient will take all Heart Failure mediations as prescribed  Over the next 30 days, patient will call RN CM if any further assistance is needed  Interventions:  . Basic overview and discussion of pathophysiology of Heart Failure . Provided written and verbal education on low sodium diet . Reviewed Heart Failure Action Plan provided written copy . Assessed for scales in home . Discussed importance of daily weight . Reviewed role of diuretics in prevention of fluid overload . Provided patient with RN CM contact information  Patient Self Care Activities:  . Take Heart Failure Medications as  prescribed . Weigh daily and record (notify MD with 3 lb weight gain over night or 5 lb in a week) . Follow CHF Action Plan . Adhere to low sodium diet  Initial goal documentation      . I need to get some of this weight off me (pt-stated)       Current Barriers:  . Knowledge Deficits related to how to successfully loose weight . Busy lifestyle/work  Nurse Case Manager Clinical Goal(s):  . Over the next 14 days, patient will begin to reduce caloric intake and increase daily activity/exercise to promote healthy weight loss-goal met 04/25/2019 . Over the next 60 days, patient will continue to reduce her caloric intake and increase daily activity/exercise to promote healthy weightloss  Interventions:  . Discussed adherence with healthy weight loss plan . Assessed for weight loss . Reviewed nutrition/food diary . Provided support and congratulated patient for weight loss success   Patient Self Care Activities:  . Self administers medications as prescribed . Attends all scheduled provider appointments . Calls pharmacy for medication refills . Attends church or other social activities . Performs ADL's independently . Performs IADL's independently . Calls provider office for new concerns or questions . Follow provided plan of care to promote healthy weight loss  Please see past updates related to this goal by clicking on the "Past Updates" button in the selected goal           The patient verbalized understanding of instructions provided today and declined a print copy of patient instruction materials.   The care management team will reach out to the patient again over the next 60 days.  The patient has been provided with contact information for the care management team and has been advised to call with any health related questions or concerns.   SYMPTOMS OF A STROKE   You have any symptoms of stroke. "BE FAST" is an easy way to remember the main warning signs: ? B - Balance.  Signs are dizziness, sudden trouble walking, or loss of balance. ? E - Eyes. Signs are trouble seeing or a sudden change in how you see. ? F - Face. Signs are sudden weakness or loss of feeling of the face, or the face or eyelid drooping on one side. ? A - Arms. Signs are weakness or loss of feeling in an arm. This happens suddenly and usually on one side of the body. ? S - Speech. Signs are sudden trouble speaking, slurred speech, or trouble understanding what people say. ? T - Time. Time to call emergency services. Write down what time symptoms started.  You have other signs of stroke, such as: ? A sudden, very bad headache with no known cause. ? Feeling sick to your stomach (nausea). ? Throwing up (vomiting). ? Jerky movements you cannot control (seizure).  SYMPTOMS OF A HEART ATTACK  What are the signs or symptoms? Symptoms of this condition include:  Chest pain. It may feel like: ? Crushing or squeezing. ? Tightness, pressure, fullness, or heaviness.  Pain in the arm, neck, jaw, back, or upper body.  Shortness of breath.  Heartburn.  Indigestion.  Nausea.  Cold sweats.  Feeling tired.  Sudden lightheadedness.

## 2019-05-06 ENCOUNTER — Ambulatory Visit (INDEPENDENT_AMBULATORY_CARE_PROVIDER_SITE_OTHER): Payer: Medicare Other | Admitting: Nurse Practitioner

## 2019-05-06 ENCOUNTER — Encounter: Payer: Self-pay | Admitting: Nurse Practitioner

## 2019-05-06 ENCOUNTER — Other Ambulatory Visit: Payer: Self-pay

## 2019-05-06 VITALS — BP 130/64 | HR 86 | Temp 96.9°F | Resp 14 | Ht 63.0 in | Wt 184.2 lb

## 2019-05-06 DIAGNOSIS — R82998 Other abnormal findings in urine: Secondary | ICD-10-CM

## 2019-05-06 DIAGNOSIS — R109 Unspecified abdominal pain: Secondary | ICD-10-CM | POA: Diagnosis not present

## 2019-05-06 LAB — POCT URINALYSIS DIPSTICK
Bilirubin, UA: NEGATIVE
Blood, UA: POSITIVE
Glucose, UA: NEGATIVE
Ketones, UA: NEGATIVE
Nitrite, UA: NEGATIVE
Odor: NORMAL
Protein, UA: NEGATIVE
Spec Grav, UA: 1.025 (ref 1.010–1.025)
Urobilinogen, UA: 0.2 E.U./dL
pH, UA: 6.5 (ref 5.0–8.0)

## 2019-05-06 NOTE — Progress Notes (Signed)
Name: [name]   MRN: [id number]    DOB: August 21, 1941   Date:05/06/2019       Progress Note  Subjective  Chief Complaint  Chief Complaint  Patient presents with  . Flank Pain    right side, pt states better now    HPI  Patient endorses right flank pain ongoing for three days stopped today. States it felt like muscle spasms. Patient was using an ointment on her back, drinking lots of water and cranberry juice and took doan pills for her back and pain resolved. Denies dyruia, urinary frequency and urinary hesitancy, urinary urgency.   PHQ2/9: Depression screen [facility] 2/9 05/06/2019 03/22/2019 11/30/2018 10/26/2018 08/18/2018  Decreased Interest 0 0 0 0 0  Down, Depressed, Hopeless 0 0 0 0 0  PHQ - 2 Score 0 0 0 0 0  Altered sleeping 0 0 2 - 0  Tired, decreased energy 0 0 0 - 0  Change in appetite 0 0 1 - 0  Feeling bad or failure about yourself  0 0 0 - 0  Trouble concentrating 0 0 0 - 0  Moving slowly or fidgety/restless 0 0 0 - 0  Suicidal thoughts 0 0 0 - 0  PHQ-9 Score 0 0 3 - 0  Difficult doing work/chores Not difficult at all - Somewhat difficult - Not difficult at all  Some recent data might be hidden     PHQ reviewed. Negative  Patient Active Problem List   Diagnosis Date Noted  . On Coumadin for atrial fibrillation ([place]) 06/30/2018  . Chronic anticoagulation 08/27/2017  . History of right breast cancer 08/27/2017  . Chronic obstructive pulmonary disease ([place]) 07/04/2015  . Controlled gout 06/06/2015  . Anxiety 02/09/2015  . Synovial cyst of popliteal space 02/09/2015  . Benign hypertension 02/09/2015  . Blood type A+ 02/09/2015  . Arterial branch occlusion of retina 02/09/2015  . Chronic constipation 02/09/2015  . Insomnia, persistent 02/09/2015  . Chronic combined systolic and diastolic CHF, NYHA class 1 ([place]) 02/09/2015  . Dyslipidemia 02/09/2015  . Atrial fibrillation ([place]) 02/09/2015  . Arthritis urica 02/09/2015  . Hearing loss 02/09/2015  . History of  open heart surgery 02/09/2015  . Chronic hoarseness 02/09/2015  . Cardiomegaly 02/09/2015  . Dysmetabolic syndrome 0000000  . Migraine with aura and without status migrainosus, not intractable 02/09/2015  . Adult BMI 30+ 02/09/2015  . Hypertensive pulmonary vascular disease ([place]) 02/09/2015  . Allergic rhinitis, seasonal 02/09/2015  . Vitamin D deficiency 02/09/2015  . History of mitral valve repair 10/01/2012  . History of aortic valve replacement 10/01/2012  . Congestive heart failure ([place]) 10/01/2012    Past Medical History:  Diagnosis Date  . Atrial fibrillation ([place])   . Breast cancer ([place]) 1992   positive, radiation  . CHF (congestive heart failure) ([place])   . COPD (chronic obstructive pulmonary disease) ([place])   . Dysrhythmia   . Gout   . History of aortic valve replacement   . Hyperlipidemia   . Hypertension     Past Surgical History:  Procedure Laterality Date  . ABDOMINAL HYSTERECTOMY  1996  . arm surgery  2019  . BREAST BIOPSY Right 1998   neg  . BREAST BIOPSY Right 2007   neg  . BREAST BIOPSY Right 1992   positive  . BREAST EXCISIONAL BIOPSY Left 2000   neg  . CARDIAC VALVE REPLACEMENT  2014  . CATARACT EXTRACTION W/PHACO Left 03/30/2018   Procedure: CATARACT EXTRACTION PHACO AND INTRAOCULAR LENS PLACEMENT (IOC);  Surgeon: Birder Robson, MD;  Location: [facility] ORS;  Service: Ophthalmology;  Laterality: Left;  [place] 00:31.9 AP% 12.1 CDE 3.87 Fluid Pack lot # [id number]  . FRACTURE SURGERY Left    ARM/ LEG  . LEG SURGERY      Social History   Tobacco Use  . Smoking status: Former Smoker    Quit date: 09/09/2007    Years since quitting: 11.6  . Smokeless tobacco: Never Used  Substance Use Topics  . Alcohol use: No    Alcohol/week: 0.0 standard drinks     Current Outpatient Medications:  .  acetaminophen (TYLENOL) 500 MG tablet, Take 1,000-1,500 mg by mouth every 6 (six) hours as needed for mild pain., Disp: , Rfl:  .  allopurinol (ZYLOPRIM) 300 MG  tablet, Take 1 tablet (300 mg total) by mouth daily., Disp: 90 tablet, Rfl: 1 .  atorvastatin (LIPITOR) 80 MG tablet, Take 1 tablet (80 mg total) by mouth at bedtime., Disp: 90 tablet, Rfl: 1 .  fluticasone (FLONASE) 50 MCG/ACT nasal spray, Place 2 sprays into both nostrils daily., Disp: 16 g, Rfl: 6 .  metoprolol tartrate (LOPRESSOR) 25 MG tablet, Take 1 tablet (25 mg total) by mouth 2 (two) times daily., Disp: 180 tablet, Rfl: 1 .  potassium chloride SA (KLOR-CON M20) 20 MEQ tablet, Take 1 tablet (20 mEq total) by mouth daily., Disp: 90 tablet, Rfl: 1 .  tiotropium (SPIRIVA HANDIHALER) 18 MCG inhalation capsule, INHALE 1 CASPULE VIA HANDIHALER ONCE A DAY AT THE SAME TIME EVERY DAY, Disp: 30 capsule, Rfl: 1 .  torsemide (DEMADEX) 20 MG tablet, Take 1 tablet (20 mg total) by mouth daily., Disp: 90 tablet, Rfl: 1 .  traZODone (DESYREL) 50 MG tablet, Take 1 tablet (50 mg total) by mouth at bedtime., Disp: 90 tablet, Rfl: 0 .  warfarin (COUMADIN) 5 MG tablet, Take 5 mg by mouth daily., Disp: , Rfl:  .  Calcium Carbonate-Vitamin D 600-400 MG-UNIT tablet, Take 1 tablet by mouth 2 (two) times daily., Disp: , Rfl:   Allergies  Allergen Reactions  . Ace Inhibitors Cough and Other (See Comments)  . Amoxicillin-Pot Clavulanate Nausea And Vomiting  . Codeine Nausea And Vomiting  . Penicillins Nausea And Vomiting and Other (See Comments)    Has patient had a PCN reaction causing immediate rash, facial/tongue/throat swelling, SOB or lightheadedness with hypotension: No Has patient had a PCN reaction causing severe rash involving mucus membranes or skin necrosis: No Has patient had a PCN reaction that required hospitalization No Has patient had a PCN reaction occurring within the last 10 years: No If all of the above answers are "NO", then may proceed with Cephalosporin use.  . Rosuvastatin Other (See Comments)    Reaction:  Joint pain   . Tetanus-Diphtheria Toxoids Td Swelling    ROS    No other  specific complaints in a complete review of systems (except as listed in HPI above).  Objective  Vitals:   05/06/19 1511  BP: 130/64  Pulse: 86  Resp: 14  Temp: (!) 96.9 F (36.1 C)  SpO2: 95%  Weight: 184 lb 3.2 oz (83.6 kg)  Height: 5\' 3"  (1.6 m)     Body mass index is 32.63 kg/m.  Nursing Note and Vital Signs reviewed.  Physical Exam Constitutional:      Appearance: Normal appearance. She is well-developed.  HENT:     Head: Normocephalic and atraumatic.     Right Ear: Hearing normal.     Left Ear: Hearing normal.  Eyes:     Conjunctiva/sclera: Conjunctivae normal.  Cardiovascular:     Rate and Rhythm: Normal rate and regular rhythm.     Heart sounds: Murmur present.  Pulmonary:     Effort: Pulmonary effort is normal.     Breath sounds: Normal breath sounds.  Abdominal:     Tenderness: There is no abdominal tenderness. There is no right CVA tenderness or left CVA tenderness.  Musculoskeletal: Normal range of motion.  Skin:    General: Skin is warm and dry.  Neurological:     Mental Status: She is alert and oriented to person, place, and time.  Psychiatric:        Speech: Speech normal.        Behavior: Behavior normal. Behavior is cooperative.        Thought Content: Thought content normal.        Judgment: Judgment normal.        Results for orders placed or performed in visit on 05/06/19 (from the past 48 hour(s))  POCT urinalysis dipstick     Status: Abnormal   Collection Time: 05/06/19  3:14 PM  Result Value Ref Range   Color, UA drk yellow    Clarity, UA clear    Glucose, UA Negative Negative   Bilirubin, UA neg    Ketones, UA neg    Spec Grav, UA 1.025 1.010 - 1.025   Blood, UA positive    pH, UA 6.5 5.0 - 8.0   Protein, UA Negative Negative   Urobilinogen, UA 0.2 0.2 or 1.0 E.U./dL   Nitrite, UA neg    Leukocytes, UA Large (3+) (A) Negative   Appearance clear    Odor normal     Assessment & Plan  1. Flank pain Resolved  - POCT  urinalysis dipstick - Urine Culture  2. Leukocytes in urine Patient is asymptomatic and flank pain has resolved, will culture urine and treat if needed- discussed risks and benefits with patient.  Repeat UA at routine follow-up due to hematuria.

## 2019-05-06 NOTE — Patient Instructions (Addendum)
  Drink plenty of water- at least 40 ounces a day  Best Ways to Help Prevent UTIs   If you've ever had a urinary tract infection, you know how painful and frustrating they can be, especially if they keep coming back. While antibiotics generally clear up a UTI within a few days, there are also some simple measures you can take to help prevent getting one in the first place. To say goodbye to burning, frequent urination, and other unpleasant symptoms, start with these changes today. The key is to keep bacteria out of your system. 1. Drink plenty of water, and relieve yourself often. The simplest way to prevent a UTI is to flush bacteria out of the bladder and urinary tract before it can set in. If you're well-hydrated, it will be tough to go too long without urinating. 2. Wipe from front to back. Bacteria tend to hang around the anus. If you wipe from front to back, especially after a bowel movement, they're less likely to make it to the urethra. 3. Wash up before sex and urinate after it. Use soap and water before sex. This keeps bacteria away from the urethra. And urinating afterward pushes any bacteria that entered the urinary tract back out. 4. Janne Napoleon clear of irritating feminine products. Skip douches, deodorant sprays, scented powders, and other potentially irritating feminine products.

## 2019-05-07 LAB — URINE CULTURE
MICRO NUMBER:: 824211
Result:: NO GROWTH
SPECIMEN QUALITY:: ADEQUATE

## 2019-05-26 DIAGNOSIS — I480 Paroxysmal atrial fibrillation: Secondary | ICD-10-CM | POA: Diagnosis not present

## 2019-05-26 DIAGNOSIS — Z9889 Other specified postprocedural states: Secondary | ICD-10-CM | POA: Diagnosis not present

## 2019-06-14 ENCOUNTER — Ambulatory Visit (INDEPENDENT_AMBULATORY_CARE_PROVIDER_SITE_OTHER): Payer: Medicare Other | Admitting: Family Medicine

## 2019-06-14 ENCOUNTER — Other Ambulatory Visit: Payer: Self-pay

## 2019-06-14 ENCOUNTER — Encounter: Payer: Self-pay | Admitting: Family Medicine

## 2019-06-14 VITALS — BP 100/78 | HR 120 | Temp 96.9°F | Resp 16 | Ht 63.0 in | Wt 183.4 lb

## 2019-06-14 DIAGNOSIS — E785 Hyperlipidemia, unspecified: Secondary | ICD-10-CM

## 2019-06-14 DIAGNOSIS — J410 Simple chronic bronchitis: Secondary | ICD-10-CM | POA: Diagnosis not present

## 2019-06-14 DIAGNOSIS — E8881 Metabolic syndrome: Secondary | ICD-10-CM | POA: Diagnosis not present

## 2019-06-14 DIAGNOSIS — M25562 Pain in left knee: Secondary | ICD-10-CM

## 2019-06-14 DIAGNOSIS — I10 Essential (primary) hypertension: Secondary | ICD-10-CM

## 2019-06-14 DIAGNOSIS — M791 Myalgia, unspecified site: Secondary | ICD-10-CM | POA: Diagnosis not present

## 2019-06-14 DIAGNOSIS — I5042 Chronic combined systolic (congestive) and diastolic (congestive) heart failure: Secondary | ICD-10-CM

## 2019-06-14 DIAGNOSIS — E559 Vitamin D deficiency, unspecified: Secondary | ICD-10-CM

## 2019-06-14 DIAGNOSIS — Z23 Encounter for immunization: Secondary | ICD-10-CM

## 2019-06-14 DIAGNOSIS — M25561 Pain in right knee: Secondary | ICD-10-CM

## 2019-06-14 DIAGNOSIS — G8929 Other chronic pain: Secondary | ICD-10-CM

## 2019-06-14 DIAGNOSIS — G43109 Migraine with aura, not intractable, without status migrainosus: Secondary | ICD-10-CM | POA: Diagnosis not present

## 2019-06-14 DIAGNOSIS — F325 Major depressive disorder, single episode, in full remission: Secondary | ICD-10-CM

## 2019-06-14 DIAGNOSIS — M109 Gout, unspecified: Secondary | ICD-10-CM

## 2019-06-14 DIAGNOSIS — T466X5A Adverse effect of antihyperlipidemic and antiarteriosclerotic drugs, initial encounter: Secondary | ICD-10-CM

## 2019-06-14 MED ORDER — EZETIMIBE 10 MG PO TABS: 10.0000 mg | ORAL_TABLET | Freq: Every day | ORAL | 3 refills | Status: DC

## 2019-06-14 MED ORDER — DICLOFENAC SODIUM 1 % TD GEL: 4.0000 g | Freq: Four times a day (QID) | TRANSDERMAL | 2 refills | Status: DC

## 2019-06-14 NOTE — Patient Instructions (Signed)
Journal for Nurse Practitioners, 15(4), 263-267. Retrieved June 14, 2018 from http://clinicalkey.com/nursing">  Knee Exercises Ask your health care provider which exercises are safe for you. Do exercises exactly as told by your health care provider and adjust them as directed. It is normal to feel mild stretching, pulling, tightness, or discomfort as you do these exercises. Stop right away if you feel sudden pain or your pain gets worse. Do not begin these exercises until told by your health care provider. Stretching and range-of-motion exercises These exercises warm up your muscles and joints and improve the movement and flexibility of your knee. These exercises also help to relieve pain and swelling. Knee extension, prone 1. Lie on your abdomen (prone position) on a bed. 2. Place your left / right knee just beyond the edge of the surface so your knee is not on the bed. You can put a towel under your left / right thigh just above your kneecap for comfort. 3. Relax your leg muscles and allow gravity to straighten your knee (extension). You should feel a stretch behind your left / right knee. 4. Hold this position for __________ seconds. 5. Scoot up so your knee is supported between repetitions. Repeat __________ times. Complete this exercise __________ times a day. Knee flexion, active  1. Lie on your back with both legs straight. If this causes back discomfort, bend your left / right knee so your foot is flat on the floor. 2. Slowly slide your left / right heel back toward your buttocks. Stop when you feel a gentle stretch in the front of your knee or thigh (flexion). 3. Hold this position for __________ seconds. 4. Slowly slide your left / right heel back to the starting position. Repeat __________ times. Complete this exercise __________ times a day. Quadriceps stretch, prone  1. Lie on your abdomen on a firm surface, such as a bed or padded floor. 2. Bend your left / right knee and hold  your ankle. If you cannot reach your ankle or pant leg, loop a belt around your foot and grab the belt instead. 3. Gently pull your heel toward your buttocks. Your knee should not slide out to the side. You should feel a stretch in the front of your thigh and knee (quadriceps). 4. Hold this position for __________ seconds. Repeat __________ times. Complete this exercise __________ times a day. Hamstring, supine 1. Lie on your back (supine position). 2. Loop a belt or towel over the ball of your left / right foot. The ball of your foot is on the walking surface, right under your toes. 3. Straighten your left / right knee and slowly pull on the belt to raise your leg until you feel a gentle stretch behind your knee (hamstring). ? Do not let your knee bend while you do this. ? Keep your other leg flat on the floor. 4. Hold this position for __________ seconds. Repeat __________ times. Complete this exercise __________ times a day. Strengthening exercises These exercises build strength and endurance in your knee. Endurance is the ability to use your muscles for a long time, even after they get tired. Quadriceps, isometric This exercise stretches the muscles in front of your thigh (quadriceps) without moving your knee joint (isometric). 1. Lie on your back with your left / right leg extended and your other knee bent. Put a rolled towel or small pillow under your knee if told by your health care provider. 2. Slowly tense the muscles in the front of your left /   right thigh. You should see your kneecap slide up toward your hip or see increased dimpling just above the knee. This motion will push the back of the knee toward the floor. 3. For __________ seconds, hold the muscle as tight as you can without increasing your pain. 4. Relax the muscles slowly and completely. Repeat __________ times. Complete this exercise __________ times a day. Straight leg raises This exercise stretches the muscles in front  of your thigh (quadriceps) and the muscles that move your hips (hip flexors). 1. Lie on your back with your left / right leg extended and your other knee bent. 2. Tense the muscles in the front of your left / right thigh. You should see your kneecap slide up or see increased dimpling just above the knee. Your thigh may even shake a bit. 3. Keep these muscles tight as you raise your leg 4-6 inches (10-15 cm) off the floor. Do not let your knee bend. 4. Hold this position for __________ seconds. 5. Keep these muscles tense as you lower your leg. 6. Relax your muscles slowly and completely after each repetition. Repeat __________ times. Complete this exercise __________ times a day. Hamstring, isometric 1. Lie on your back on a firm surface. 2. Bend your left / right knee about __________ degrees. 3. Dig your left / right heel into the surface as if you are trying to pull it toward your buttocks. Tighten the muscles in the back of your thighs (hamstring) to "dig" as hard as you can without increasing any pain. 4. Hold this position for __________ seconds. 5. Release the tension gradually and allow your muscles to relax completely for __________ seconds after each repetition. Repeat __________ times. Complete this exercise __________ times a day. Hamstring curls If told by your health care provider, do this exercise while wearing ankle weights. Begin with __________ lb weights. Then increase the weight by 1 lb (0.5 kg) increments. Do not wear ankle weights that are more than __________ lb. 1. Lie on your abdomen with your legs straight. 2. Bend your left / right knee as far as you can without feeling pain. Keep your hips flat against the floor. 3. Hold this position for __________ seconds. 4. Slowly lower your leg to the starting position. Repeat __________ times. Complete this exercise __________ times a day. Squats This exercise strengthens the muscles in front of your thigh and knee  (quadriceps). 1. Stand in front of a table, with your feet and knees pointing straight ahead. You may rest your hands on the table for balance but not for support. 2. Slowly bend your knees and lower your hips like you are going to sit in a chair. ? Keep your weight over your heels, not over your toes. ? Keep your lower legs upright so they are parallel with the table legs. ? Do not let your hips go lower than your knees. ? Do not bend lower than told by your health care provider. ? If your knee pain increases, do not bend as low. 3. Hold the squat position for __________ seconds. 4. Slowly push with your legs to return to standing. Do not use your hands to pull yourself to standing. Repeat __________ times. Complete this exercise __________ times a day. Wall slides This exercise strengthens the muscles in front of your thigh and knee (quadriceps). 1. Lean your back against a smooth wall or door, and walk your feet out 18-24 inches (46-61 cm) from it. 2. Place your feet hip-width apart. 3.   Slowly slide down the wall or door until your knees bend __________ degrees. Keep your knees over your heels, not over your toes. Keep your knees in line with your hips. 4. Hold this position for __________ seconds. Repeat __________ times. Complete this exercise __________ times a day. Straight leg raises This exercise strengthens the muscles that rotate the leg at the hip and move it away from your body (hip abductors). 1. Lie on your side with your left / right leg in the top position. Lie so your head, shoulder, knee, and hip line up. You may bend your bottom knee to help you keep your balance. 2. Roll your hips slightly forward so your hips are stacked directly over each other and your left / right knee is facing forward. 3. Leading with your heel, lift your top leg 4-6 inches (10-15 cm). You should feel the muscles in your outer hip lifting. ? Do not let your foot drift forward. ? Do not let your knee  roll toward the ceiling. 4. Hold this position for __________ seconds. 5. Slowly return your leg to the starting position. 6. Let your muscles relax completely after each repetition. Repeat __________ times. Complete this exercise __________ times a day. Straight leg raises This exercise stretches the muscles that move your hips away from the front of the pelvis (hip extensors). 1. Lie on your abdomen on a firm surface. You can put a pillow under your hips if that is more comfortable. 2. Tense the muscles in your buttocks and lift your left / right leg about 4-6 inches (10-15 cm). Keep your knee straight as you lift your leg. 3. Hold this position for __________ seconds. 4. Slowly lower your leg to the starting position. 5. Let your leg relax completely after each repetition. Repeat __________ times. Complete this exercise __________ times a day. This information is not intended to replace advice given to you by your health care provider. Make sure you discuss any questions you have with your health care provider. Document Released: 07/09/2005 Document Revised: 06/15/2018 Document Reviewed: 06/15/2018 [employer] Patient Education  2020 [employer]  

## 2019-06-14 NOTE — Progress Notes (Signed)
Name: [name]   MRN: [id number]    DOB: Jun 18, 1941   Date:06/14/2019       Progress Note  Subjective  Chief Complaint  Chief Complaint  Patient presents with  . Leg Pain    bilateral  . Follow-up    HPI  Chronic combined CHF: she is on Demadex and potassium,offdigoxinand beta-blocker . No chest , no SOB at rest, but has some with moderate activity , she denies orthopnea. She is not on ACE because of cough, she was on Losartan for a period of time but bp dropped so cardiologist discontinued medications. She has regular follow up with Dr. Clayborn Bigness.   HTN: taking metoprolol, bp much better controlled since aortic valve replacement. No chest pain ,she states noSOBat this time.BP is at goal   Hyperlipidemia:she is not taking zetia daily because of cost, stopped medication,she was  taking half of Atorvastatin alternating with a full pill but caused muscle aches and she stopped , she is wiling to go back on Zetia since she is no longer having a car payment and can afford it at this time  Insomnia:shetakes Trazodone prn usually just half a pill , most nights she sleeps without medication.She states when she gets up at night she feels tired   Gout:no recent episodes, taking allopurinol daily.She thinks she had a flare about one month ago on right knee, was tender and swollen   Dysmetabolic Syndrome: she is not very compliant with diet. She denies polyphagia, polyuria or polydipsiaLast hgbA1C was normal. Recheck yearly   Afib: rate controlled, no fluttering sensation, taking betablocker andoffdigoxin, also on coumadin and goes to coumadin clinic/Dr. Callwood,  Doing well, last week INR was at goal   Chronic bronchitis : sheis taking Spirivaand takes it prn for wheezing or SOB.She states she has a productive cough in am's .Quit smoking in 2010. Unchanged   Depression:shehas a long history of depression, used to take SSRI's, symptoms now are intermittent and  has been in remission for months, only symptoms is difficulty sleeping and takes trazodone prn only, occasionally only . Shedenies suicidal thoughts or ideation.She is motivated, working part time and is feeling well , glad that she paid off her car last month  Pain on both knees: she has a long history of knee pain, states recently feeling worse, feels stiff when she first gets up, has to use arms to assist with getting up from a chair. She also noticed an effusion on right knee about one month ago, both knees are painful, but right knee worse than right.    Patient Active Problem List   Diagnosis Date Noted  . On Coumadin for atrial fibrillation ([place]) 06/30/2018  . Chronic anticoagulation 08/27/2017  . History of right breast cancer 08/27/2017  . Chronic obstructive pulmonary disease ([place]) 07/04/2015  . Controlled gout 06/06/2015  . Anxiety 02/09/2015  . Synovial cyst of popliteal space 02/09/2015  . Benign hypertension 02/09/2015  . Blood type A+ 02/09/2015  . Arterial branch occlusion of retina 02/09/2015  . Chronic constipation 02/09/2015  . Insomnia, persistent 02/09/2015  . Chronic combined systolic and diastolic CHF, NYHA class 1 ([place]) 02/09/2015  . Dyslipidemia 02/09/2015  . Atrial fibrillation ([place]) 02/09/2015  . Arthritis urica 02/09/2015  . Hearing loss 02/09/2015  . History of open heart surgery 02/09/2015  . Chronic hoarseness 02/09/2015  . Cardiomegaly 02/09/2015  . Dysmetabolic syndrome 0000000  . Migraine with aura and without status migrainosus, not intractable 02/09/2015  . Adult BMI 30+  02/09/2015  . Hypertensive pulmonary vascular disease ([place]) 02/09/2015  . Allergic rhinitis, seasonal 02/09/2015  . Vitamin D deficiency 02/09/2015  . History of mitral valve repair 10/01/2012  . History of aortic valve replacement 10/01/2012  . Congestive heart failure ([place]) 10/01/2012    Past Surgical History:  Procedure Laterality Date  . ABDOMINAL HYSTERECTOMY   1996  . arm surgery  2019  . BREAST BIOPSY Right 1998   neg  . BREAST BIOPSY Right 2007   neg  . BREAST BIOPSY Right 1992   positive  . BREAST EXCISIONAL BIOPSY Left 2000   neg  . CARDIAC VALVE REPLACEMENT  2014  . CATARACT EXTRACTION W/PHACO Left 03/30/2018   Procedure: CATARACT EXTRACTION PHACO AND INTRAOCULAR LENS PLACEMENT (IOC);  Surgeon: Birder Robson, MD;  Location: [facility] ORS;  Service: Ophthalmology;  Laterality: Left;  [place] 00:31.9 AP% 12.1 CDE 3.87 Fluid Pack lot # [id number]  . FRACTURE SURGERY Left    ARM/ LEG  . LEG SURGERY      Family History  Problem Relation Age of Onset  . Cancer Mother   . Diabetes Mother   . Hypertension Mother   . Breast cancer Mother 22  . Hypertension Father   . Heart failure Father   . Cancer Brother        bladder  . Cancer Sister        breast  . Breast cancer Sister 24  . Hypertension Son   . Hypertension Daughter   . Breast cancer Sister 22    Social History   Socioeconomic History  . Marital status: Widowed    Spouse name: Not on file  . Number of children: 2  . Years of education: Not on file  . Highest education level: 12th grade  Occupational History  . Occupation: cafeteria     Comment: [employer]  Social Needs  . Financial resource strain: Not hard at all  . Food insecurity    Worry: Never true    Inability: Never true  . Transportation needs    Medical: No    Non-medical: No  Tobacco Use  . Smoking status: Former Smoker    Quit date: 09/09/2007    Years since quitting: 11.7  . Smokeless tobacco: Never Used  Substance and Sexual Activity  . Alcohol use: No    Alcohol/week: 0.0 standard drinks  . Drug use: No  . Sexual activity: Not Currently  Lifestyle  . Physical activity    Days per week: 0 days    Minutes per session: 0 min  . Stress: Only a little  Relationships  . Social connections    Talks on phone: More than three times a week    Gets together: More than three times a week     Attends religious service: More than 4 times per year    Active member of club or organization: Yes    Attends meetings of clubs or organizations: More than 4 times per year    Relationship status: Widowed  . Intimate partner violence    Fear of current or ex partner: No    Emotionally abused: No    Physically abused: No    Forced sexual activity: No  Other Topics Concern  . Not on file  Social History Narrative  . Not on file     Current Outpatient Medications:  .  acetaminophen (TYLENOL) 500 MG tablet, Take 1,000-1,500 mg by mouth every 6 (six) hours as needed for mild pain., Disp: ,  Rfl:  .  allopurinol (ZYLOPRIM) 300 MG tablet, Take 1 tablet (300 mg total) by mouth daily., Disp: 90 tablet, Rfl: 1 .  Ergocalciferol (VITAMIN D2) 50 MCG (2000 UT) TABS, Take 2,000 Units by mouth daily., Disp: , Rfl:  .  fluticasone (FLONASE) 50 MCG/ACT nasal spray, Place 2 sprays into both nostrils daily., Disp: 16 g, Rfl: 6 .  metoprolol tartrate (LOPRESSOR) 25 MG tablet, Take 1 tablet (25 mg total) by mouth 2 (two) times daily., Disp: 180 tablet, Rfl: 1 .  potassium chloride SA (KLOR-CON M20) 20 MEQ tablet, Take 1 tablet (20 mEq total) by mouth daily., Disp: 90 tablet, Rfl: 1 .  tiotropium (SPIRIVA HANDIHALER) 18 MCG inhalation capsule, INHALE 1 CASPULE VIA HANDIHALER ONCE A DAY AT THE SAME TIME EVERY DAY, Disp: 30 capsule, Rfl: 1 .  torsemide (DEMADEX) 20 MG tablet, Take 1 tablet (20 mg total) by mouth daily., Disp: 90 tablet, Rfl: 1 .  traZODone (DESYREL) 50 MG tablet, Take 1 tablet (50 mg total) by mouth at bedtime., Disp: 90 tablet, Rfl: 0 .  warfarin (COUMADIN) 5 MG tablet, Take 5 mg by mouth daily., Disp: , Rfl:  .  clindamycin (CLEOCIN) 300 MG capsule, TAKE 600 MG (2 TABLETS)  1 HOUR PRIOR TO DENTAL PROCEDURE, Disp: , Rfl:  .  diclofenac sodium (VOLTAREN) 1 % GEL, Apply 4 g topically 4 (four) times daily., Disp: 100 g, Rfl: 2 .  ezetimibe (ZETIA) 10 MG tablet, Take 1 tablet (10 mg total) by  mouth daily., Disp: 90 tablet, Rfl: 3  Allergies  Allergen Reactions  . Ace Inhibitors Cough and Other (See Comments)  . Amoxicillin-Pot Clavulanate Nausea And Vomiting  . Codeine Nausea And Vomiting  . Penicillins Nausea And Vomiting and Other (See Comments)    Has patient had a PCN reaction causing immediate rash, facial/tongue/throat swelling, SOB or lightheadedness with hypotension: No Has patient had a PCN reaction causing severe rash involving mucus membranes or skin necrosis: No Has patient had a PCN reaction that required hospitalization No Has patient had a PCN reaction occurring within the last 10 years: No If all of the above answers are "NO", then may proceed with Cephalosporin use.  . Rosuvastatin Other (See Comments)    Reaction:  Joint pain   . Tetanus-Diphtheria Toxoids Td Swelling    I personally reviewed active problem list, medication list, allergies, family history, social history, health maintenance with the patient/caregiver today.   ROS  Constitutional: Negative for fever or weight change.  Respiratory: Negative for cough and shortness of breath.   Cardiovascular: Negative for chest pain or palpitations.  Gastrointestinal: Negative for abdominal pain, no bowel changes.  Musculoskeletal: Negative for gait problem , positive for intermittent right knee  joint swelling.  Skin: Negative for rash.  Neurological: Negative for dizziness or headache.  No other specific complaints in a complete review of systems (except as listed in HPI above).   Objective  Vitals:   06/14/19 0817 06/14/19 0854 06/14/19 0855 06/14/19 0856  BP: 114/70 110/80 114/80 100/78  Pulse: 63 91 91 (!) 120  Resp: 16     Temp: (!) 96.9 F (36.1 C)     TempSrc: Temporal     SpO2: 99%     Weight: 183 lb 6.4 oz (83.2 kg)     Height: 5\' 3"  (1.6 m)       Body mass index is 32.49 kg/m.  Physical Exam  Constitutional: Patient appears well-developed and well-nourished. Obese  No  distress.  HEENT: head atraumatic, normocephalic, pupils equal and reactive to light Cardiovascular: Normal rate, regular rhythm and normal heart sounds.  No murmur heard. No BLE edema. Pulmonary/Chest: Effort normal and breath sounds normal. No respiratory distress. Abdominal: Soft.  There is no tenderness. Muscular Skeletal difficulty getting up from chair, feels stiff, using her arms, scars from left lower leg surgery, favoring left lower leg, Crepitus on both knees, no effusion, pain during pressure over the right trochanteric bursa Psychiatric: Patient has a normal mood and affect. behavior is normal. Judgment and thought content normal.  Recent Results (from the past 2160 hour(s))  Urine Culture     Status: None   Collection Time: 05/06/19 12:00 AM   Specimen: Urine  Result Value Ref Range   MICRO NUMBER: [id number]    SPECIMEN QUALITY: Adequate    Sample Source URINE, CLEAN CATCH    STATUS: FINAL    Result: No Growth   POCT urinalysis dipstick     Status: Abnormal   Collection Time: 05/06/19  3:14 PM  Result Value Ref Range   Color, UA drk yellow    Clarity, UA clear    Glucose, UA Negative Negative   Bilirubin, UA neg    Ketones, UA neg    Spec Grav, UA 1.025 1.010 - 1.025   Blood, UA positive    pH, UA 6.5 5.0 - 8.0   Protein, UA Negative Negative   Urobilinogen, UA 0.2 0.2 or 1.0 E.U./dL   Nitrite, UA neg    Leukocytes, UA Large (3+) (A) Negative   Appearance clear    Odor normal      PHQ2/9: Depression screen [facility] 2/9 06/14/2019 05/06/2019 03/22/2019 11/30/2018 10/26/2018  Decreased Interest 0 0 0 0 0  Down, Depressed, Hopeless 0 0 0 0 0  PHQ - 2 Score 0 0 0 0 0  Altered sleeping 0 0 0 2 -  Tired, decreased energy 0 0 0 0 -  Change in appetite 0 0 0 1 -  Feeling bad or failure about yourself  0 0 0 0 -  Trouble concentrating 0 0 0 0 -  Moving slowly or fidgety/restless 0 0 0 0 -  Suicidal thoughts 0 0 0 0 -  PHQ-9 Score 0 0 0 3 -  Difficult doing work/chores -  Not difficult at all - Somewhat difficult -  Some recent data might be hidden    phq 9 is negative   Fall Risk: Fall Risk  05/06/2019 03/22/2019 11/30/2018 10/26/2018 08/18/2018  Falls in the past year? 0 0 0 0 0  Number falls in past yr: 0 0 0 0 0  Injury with Fall? 0 0 0 0 0  Comment - - - - -  Risk for fall due to : - - - History of fall(s) -  Follow up - - - Falls prevention discussed -    Functional Status Survey: Is the patient deaf or have difficulty hearing?: No Does the patient have difficulty seeing, even when wearing glasses/contacts?: No Does the patient have difficulty concentrating, remembering, or making decisions?: No Does the patient have difficulty walking or climbing stairs?: No Does the patient have difficulty dressing or bathing?: No Does the patient have difficulty doing errands alone such as visiting a doctor's office or shopping?: No    Assessment & Plan  1. Dyslipidemia  - ezetimibe (ZETIA) 10 MG tablet; Take 1 tablet (10 mg total) by mouth daily.  Dispense: 90 tablet; Refill: 3  2. Need for immunization  against influenza  - Flu Vaccine QUAD High Dose(Fluad)  3. Benign hypertension  At goal   4. Migraine with aura and without status migrainosus, not intractable   5. Major depression in remission [facility])  Doing well   6. Dysmetabolic syndrome  Recheck labs yearly   7. Simple chronic bronchitis (HCC)  Stable  8. Controlled gout   9. Vitamin D deficiency  Continue otc supplementation   10. Myalgia due to statin  Could not tolerate atorvastatin, crestor   11. Chronic pain of both knees  - DG KNEE 3 VIEW LEFT; Future - DG Knee 3 Views Right; Future - diclofenac sodium (VOLTAREN) 1 % GEL; Apply 4 g topically 4 (four) times daily.  Dispense: 100 g; Refill: 2  12. Chronic combined systolic and diastolic congestive heart failure (HCC)  stable

## 2019-06-17 ENCOUNTER — Ambulatory Visit
Admission: RE | Admit: 2019-06-17 | Discharge: 2019-06-17 | Disposition: A | Discharge status: Home / Self Care | Payer: Medicare Other | Source: Ambulatory Visit | Attending: Family Medicine | Admitting: Family Medicine

## 2019-06-17 ENCOUNTER — Other Ambulatory Visit: Payer: Self-pay

## 2019-06-17 ENCOUNTER — Other Ambulatory Visit: Payer: Self-pay | Admitting: Family Medicine

## 2019-06-17 DIAGNOSIS — M25562 Pain in left knee: Secondary | ICD-10-CM | POA: Insufficient documentation

## 2019-06-17 DIAGNOSIS — S82142A Displaced bicondylar fracture of left tibia, initial encounter for closed fracture: Secondary | ICD-10-CM | POA: Diagnosis not present

## 2019-06-17 DIAGNOSIS — G8929 Other chronic pain: Secondary | ICD-10-CM

## 2019-06-17 DIAGNOSIS — M25561 Pain in right knee: Secondary | ICD-10-CM | POA: Insufficient documentation

## 2019-06-22 ENCOUNTER — Ambulatory Visit (INDEPENDENT_AMBULATORY_CARE_PROVIDER_SITE_OTHER): Payer: Medicare Other | Admitting: Family Medicine

## 2019-06-22 ENCOUNTER — Encounter: Payer: Self-pay | Admitting: Family Medicine

## 2019-06-22 ENCOUNTER — Other Ambulatory Visit: Payer: Self-pay

## 2019-06-22 VITALS — BP 114/64 | HR 96 | Temp 97.5°F | Resp 16 | Ht 63.0 in | Wt 188.2 lb

## 2019-06-22 DIAGNOSIS — M7061 Trochanteric bursitis, right hip: Secondary | ICD-10-CM

## 2019-06-22 DIAGNOSIS — M1732 Unilateral post-traumatic osteoarthritis, left knee: Secondary | ICD-10-CM | POA: Diagnosis not present

## 2019-06-22 NOTE — Progress Notes (Signed)
Name: PAULLA HERZBERG   MRN: [id number]    DOB: [birth year]   Date:06/22/2019       Progress Note  Subjective  Chief Complaint  Chief Complaint  Patient presents with  . Knee Pain    Here for injections in her knees due to stiffness    HPI  OA of left knee also has small osteochondral defect in the medial aspect of the lateral  femoral condyle  of right knee, she is able to walk well, work, just has stiffness and pain when getting up. Reviewed x-ray with her. She states problems started after left knee surgery and fracture . She states pain improved a little with voltaren topically but just using once a day. She cannot take oral nsaids. Advised to go up to 4 times a day   Right trochanteric bursitis: she has pain on right outer hip, stiff when she gets up    Patient Active Problem List   Diagnosis Date Noted  . On Coumadin for atrial fibrillation ([place]) 06/30/2018  . Chronic anticoagulation 08/27/2017  . History of right breast cancer 08/27/2017  . Chronic obstructive pulmonary disease ([place]) 07/04/2015  . Controlled gout 06/06/2015  . Anxiety 02/09/2015  . Synovial cyst of popliteal space 02/09/2015  . Benign hypertension 02/09/2015  . Blood type A+ 02/09/2015  . Arterial branch occlusion of retina 02/09/2015  . Chronic constipation 02/09/2015  . Insomnia, persistent 02/09/2015  . Chronic combined systolic and diastolic CHF, NYHA class 1 ([place]) 02/09/2015  . Dyslipidemia 02/09/2015  . Atrial fibrillation ([place]) 02/09/2015  . Arthritis urica 02/09/2015  . Hearing loss 02/09/2015  . History of open heart surgery 02/09/2015  . Chronic hoarseness 02/09/2015  . Cardiomegaly 02/09/2015  . Dysmetabolic syndrome 0000000  . Migraine with aura and without status migrainosus, not intractable 02/09/2015  . Adult BMI 30+ 02/09/2015  . Hypertensive pulmonary vascular disease ([place]) 02/09/2015  . Allergic rhinitis, seasonal 02/09/2015  . Vitamin D deficiency 02/09/2015  . History of  mitral valve repair 10/01/2012  . History of aortic valve replacement 10/01/2012  . Congestive heart failure ([place]) 10/01/2012    Past Surgical History:  Procedure Laterality Date  . ABDOMINAL HYSTERECTOMY  1996  . arm surgery  2019  . BREAST BIOPSY Right 1998   neg  . BREAST BIOPSY Right 2007   neg  . BREAST BIOPSY Right 1992   positive  . BREAST EXCISIONAL BIOPSY Left 2000   neg  . CARDIAC VALVE REPLACEMENT  2014  . CATARACT EXTRACTION W/PHACO Left 03/30/2018   Procedure: CATARACT EXTRACTION PHACO AND INTRAOCULAR LENS PLACEMENT (IOC);  Surgeon: Birder Robson, MD;  Location: [facility] ORS;  Service: Ophthalmology;  Laterality: Left;  [place] 00:31.9 AP% 12.1 CDE 3.87 Fluid Pack lot # [id number]  . FRACTURE SURGERY Left    ARM/ LEG  . LEG SURGERY      Family History  Problem Relation Age of Onset  . Cancer Mother   . Diabetes Mother   . Hypertension Mother   . Breast cancer Mother 53  . Hypertension Father   . Heart failure Father   . Cancer Brother        bladder  . Cancer Sister        breast  . Breast cancer Sister 62  . Hypertension Son   . Hypertension Daughter   . Breast cancer Sister 63    Social History   Socioeconomic History  . Marital status: Widowed    Spouse name: Not on file  .  Number of children: 2  . Years of education: Not on file  . Highest education level: 12th grade  Occupational History  . Occupation: cafeteria     Comment: [employer]  Social Needs  . Financial resource strain: Not hard at all  . Food insecurity    Worry: Never true    Inability: Never true  . Transportation needs    Medical: No    Non-medical: No  Tobacco Use  . Smoking status: Former Smoker    Quit date: 09/09/2007    Years since quitting: 11.7  . Smokeless tobacco: Never Used  Substance and Sexual Activity  . Alcohol use: No    Alcohol/week: 0.0 standard drinks  . Drug use: No  . Sexual activity: Not Currently  Lifestyle  . Physical activity    Days per  week: 0 days    Minutes per session: 0 min  . Stress: Only a little  Relationships  . Social connections    Talks on phone: More than three times a week    Gets together: More than three times a week    Attends religious service: More than 4 times per year    Active member of club or organization: Yes    Attends meetings of clubs or organizations: More than 4 times per year    Relationship status: Widowed  . Intimate partner violence    Fear of current or ex partner: No    Emotionally abused: No    Physically abused: No    Forced sexual activity: No  Other Topics Concern  . Not on file  Social History Narrative  . Not on file     Current Outpatient Medications:  .  acetaminophen (TYLENOL) 500 MG tablet, Take 1,000-1,500 mg by mouth every 6 (six) hours as needed for mild pain., Disp: , Rfl:  .  allopurinol (ZYLOPRIM) 300 MG tablet, Take 1 tablet (300 mg total) by mouth daily., Disp: 90 tablet, Rfl: 1 .  clindamycin (CLEOCIN) 300 MG capsule, TAKE 600 MG (2 TABLETS)  1 HOUR PRIOR TO DENTAL PROCEDURE, Disp: , Rfl:  .  diclofenac sodium (VOLTAREN) 1 % GEL, Apply 4 g topically 4 (four) times daily., Disp: 100 g, Rfl: 2 .  Ergocalciferol (VITAMIN D2) 50 MCG (2000 UT) TABS, Take 2,000 Units by mouth daily., Disp: , Rfl:  .  ezetimibe (ZETIA) 10 MG tablet, Take 1 tablet (10 mg total) by mouth daily., Disp: 90 tablet, Rfl: 3 .  fluticasone (FLONASE) 50 MCG/ACT nasal spray, Place 2 sprays into both nostrils daily., Disp: 16 g, Rfl: 6 .  metoprolol tartrate (LOPRESSOR) 25 MG tablet, Take 1 tablet (25 mg total) by mouth 2 (two) times daily., Disp: 180 tablet, Rfl: 1 .  potassium chloride SA (KLOR-CON M20) 20 MEQ tablet, Take 1 tablet (20 mEq total) by mouth daily., Disp: 90 tablet, Rfl: 1 .  tiotropium (SPIRIVA HANDIHALER) 18 MCG inhalation capsule, INHALE 1 CASPULE VIA HANDIHALER ONCE A DAY AT THE SAME TIME EVERY DAY, Disp: 30 capsule, Rfl: 1 .  torsemide (DEMADEX) 20 MG tablet, Take 1 tablet  (20 mg total) by mouth daily., Disp: 90 tablet, Rfl: 1 .  traZODone (DESYREL) 50 MG tablet, Take 1 tablet (50 mg total) by mouth at bedtime., Disp: 90 tablet, Rfl: 0 .  warfarin (COUMADIN) 5 MG tablet, Take 5 mg by mouth daily., Disp: , Rfl:   Allergies  Allergen Reactions  . Ace Inhibitors Cough and Other (See Comments)  . Amoxicillin-Pot Clavulanate Nausea  And Vomiting  . Codeine Nausea And Vomiting  . Penicillins Nausea And Vomiting and Other (See Comments)    Has patient had a PCN reaction causing immediate rash, facial/tongue/throat swelling, SOB or lightheadedness with hypotension: No Has patient had a PCN reaction causing severe rash involving mucus membranes or skin necrosis: No Has patient had a PCN reaction that required hospitalization No Has patient had a PCN reaction occurring within the last 10 years: No If all of the above answers are "NO", then may proceed with Cephalosporin use.  . Rosuvastatin Other (See Comments)    Reaction:  Joint pain   . Tetanus-Diphtheria Toxoids Td Swelling    I personally reviewed active problem list, medication list, allergies, family history, social history with the patient/caregiver today.   ROS  Ten systems reviewed and is negative except as mentioned in HPI   Objective  Vitals:   06/22/19 1427  BP: 114/64  Pulse: 96  Resp: 16  Temp: (!) 97.5 F (36.4 C)  TempSrc: Temporal  SpO2: 99%  Weight: 188 lb 3.2 oz (85.4 kg)  Height: 5\' 3"  (1.6 m)    Body mass index is 33.34 kg/m.  Physical Exam  Constitutional: Patient appears well-developed and well-nourished. Obese  No distress.  HEENT: head atraumatic, normocephalic, pupils equal and reactive to light Cardiovascular: Normal rate, irregular rhythm, 2 systolic ejection  murmur heard. No BLE edema. Pulmonary/Chest: Effort normal and breath sounds normal. No respiratory distress. Abdominal: Soft.  There is no tenderness. Muscular Skeletal: crepitus during extension of left  knee, mild effusion, no erythema or increase in warmth, pain during rom of right hip and palpation of right trochanteric bursa  Psychiatric: Patient has a normal mood and affect. behavior is normal. Judgment and thought content normal.  Recent Results (from the past 2160 hour(s))  Urine Culture     Status: None   Collection Time: 05/06/19 12:00 AM   Specimen: Urine  Result Value Ref Range   MICRO NUMBER: [id number]    SPECIMEN QUALITY: Adequate    Sample Source URINE, CLEAN CATCH    STATUS: FINAL    Result: No Growth   POCT urinalysis dipstick     Status: Abnormal   Collection Time: 05/06/19  3:14 PM  Result Value Ref Range   Color, UA drk yellow    Clarity, UA clear    Glucose, UA Negative Negative   Bilirubin, UA neg    Ketones, UA neg    Spec Grav, UA 1.025 1.010 - 1.025   Blood, UA positive    pH, UA 6.5 5.0 - 8.0   Protein, UA Negative Negative   Urobilinogen, UA 0.2 0.2 or 1.0 E.U./dL   Nitrite, UA neg    Leukocytes, UA Large (3+) (A) Negative   Appearance clear    Odor normal       PHQ2/9: Depression screen [facility] 2/9 06/22/2019 06/14/2019 05/06/2019 03/22/2019 11/30/2018  Decreased Interest 0 0 0 0 0  Down, Depressed, Hopeless 0 0 0 0 0  PHQ - 2 Score 0 0 0 0 0  Altered sleeping 0 0 0 0 2  Tired, decreased energy 0 0 0 0 0  Change in appetite 0 0 0 0 1  Feeling bad or failure about yourself  0 0 0 0 0  Trouble concentrating 0 0 0 0 0  Moving slowly or fidgety/restless 0 0 0 0 0  Suicidal thoughts 0 0 0 0 0  PHQ-9 Score 0 0 0 0 3  Difficult doing  work/chores Not difficult at all - Not difficult at all - Somewhat difficult  Some recent data might be hidden    phq 9 is negative   Fall Risk: Fall Risk  06/22/2019 05/06/2019 03/22/2019 11/30/2018 10/26/2018  Falls in the past year? 0 0 0 0 0  Number falls in past yr: 0 0 0 0 0  Injury with Fall? 0 0 0 0 0  Comment - - - - -  Risk for fall due to : - - - - History of fall(s)  Follow up - - - - Falls prevention  discussed    Functional Status Survey: Is the patient deaf or have difficulty hearing?: No Does the patient have difficulty seeing, even when wearing glasses/contacts?: Yes Does the patient have difficulty concentrating, remembering, or making decisions?: No Does the patient have difficulty walking or climbing stairs?: No Does the patient have difficulty dressing or bathing?: No Does the patient have difficulty doing errands alone such as visiting a doctor's office or shopping?: No    Assessment & Plan  1. Unilateral post-traumatic osteoarthritis, left knee  - Ambulatory referral to Physical Therapy  2. Trochanteric bursitis of right hip  - Ambulatory referral to Physical Therapy

## 2019-06-23 DIAGNOSIS — Z1211 Encounter for screening for malignant neoplasm of colon: Secondary | ICD-10-CM | POA: Diagnosis not present

## 2019-06-24 ENCOUNTER — Ambulatory Visit: Payer: Medicare Other | Admitting: Family Medicine

## 2019-06-24 ENCOUNTER — Telehealth: Payer: Self-pay | Admitting: Family Medicine

## 2019-06-24 NOTE — Telephone Encounter (Signed)
Pt has arthitis in her knee and is asking that you give her a prescription for Lovega (Omega 3). The nurse at her job put her knee in a sleeve due to it hurting and swelling. Said the [employer] 3 otc  Is not strong enough and said her insurance will cover it. Please send to [email or web address]. Please call pt once this is done

## 2019-06-24 NOTE — Telephone Encounter (Signed)
Called patient in regards to taking Omega 3. No answer, lvm.

## 2019-06-28 ENCOUNTER — Other Ambulatory Visit: Payer: Self-pay | Admitting: *Deleted

## 2019-06-28 ENCOUNTER — Ambulatory Visit: Payer: BC Managed Care – PPO

## 2019-06-28 DIAGNOSIS — Z20822 Contact with and (suspected) exposure to covid-19: Secondary | ICD-10-CM

## 2019-06-29 LAB — NOVEL CORONAVIRUS, NAA: SARS-CoV-2, NAA: NOT DETECTED

## 2019-06-29 LAB — COLOGUARD: Cologuard: NEGATIVE

## 2019-07-01 ENCOUNTER — Telehealth: Payer: Self-pay

## 2019-07-01 NOTE — Telephone Encounter (Signed)
Copied from [place] [id number]. Topic: General - Inquiry >> Jun 30, 2019 12:36 PM Virl Axe D wrote: Reason for CRM: Pt states she tested negative for Covid 19 and needs a letter from Dr. Ancil Boozer stating she tested negative and she is okay to return to work. Please contact pt when it's ready for pickup. She needs it as soon as possible so she can return to work on Wednesday.

## 2019-07-06 DIAGNOSIS — Z9889 Other specified postprocedural states: Secondary | ICD-10-CM | POA: Diagnosis not present

## 2019-07-06 DIAGNOSIS — I480 Paroxysmal atrial fibrillation: Secondary | ICD-10-CM | POA: Diagnosis not present

## 2019-07-20 ENCOUNTER — Ambulatory Visit: Payer: BC Managed Care – PPO

## 2019-07-27 ENCOUNTER — Encounter: Payer: Self-pay | Admitting: Family Medicine

## 2019-07-27 ENCOUNTER — Ambulatory Visit (INDEPENDENT_AMBULATORY_CARE_PROVIDER_SITE_OTHER): Payer: Medicare Other | Admitting: Family Medicine

## 2019-07-27 ENCOUNTER — Other Ambulatory Visit: Payer: Self-pay

## 2019-07-27 VITALS — BP 132/82 | HR 63 | Temp 97.3°F | Resp 16 | Ht 63.0 in | Wt 189.8 lb

## 2019-07-27 DIAGNOSIS — I5042 Chronic combined systolic (congestive) and diastolic (congestive) heart failure: Secondary | ICD-10-CM | POA: Diagnosis not present

## 2019-07-27 DIAGNOSIS — E8881 Metabolic syndrome: Secondary | ICD-10-CM

## 2019-07-27 DIAGNOSIS — M170 Bilateral primary osteoarthritis of knee: Secondary | ICD-10-CM

## 2019-07-27 DIAGNOSIS — R739 Hyperglycemia, unspecified: Secondary | ICD-10-CM

## 2019-07-27 MED ORDER — METFORMIN HCL 500 MG PO TABS: 500.0000 mg | ORAL_TABLET | Freq: Two times a day (BID) | ORAL | 0 refills | Status: DC

## 2019-07-27 NOTE — Progress Notes (Signed)
Name: [name]   MRN: [id number]    DOB: 04-20-1941   Date:07/27/2019       Progress Note  Subjective  Chief Complaint  Chief Complaint  Patient presents with  . Follow-up    Unable to tolerate a statin  . Chronic combined CHF    Needs refill on Lasix due to fluid  . Knee Pain    Has improved-going to start therapy soon    HPI  CHF: she states her abdominal girth has increased and would like to take more Demadex. She denies change in exercise of tolerance, lower extremity edema , wheezing . She has gained 6 lbs in the past 6 weeks. She states she has been feeling hungry and eating more than usual. Discussed we can try Metformin for pre-diabetes also discussed importance of dietary changes ( needs to stop eating desserts)   Metabolic Syndrome: she has a history of glucose elevation, also increase in abdominal girth, acanthosis nigricans on her neck, she denies polyphagia, polydipsia or polyuria Discussed possible side effects of medication   Knee OA: going to start PT soon and is looking forward to the ability to exercise again   Patient Active Problem List   Diagnosis Date Noted  . On Coumadin for atrial fibrillation ([place]) 06/30/2018  . Chronic anticoagulation 08/27/2017  . History of right breast cancer 08/27/2017  . Chronic obstructive pulmonary disease ([place]) 07/04/2015  . Controlled gout 06/06/2015  . Anxiety 02/09/2015  . Synovial cyst of popliteal space 02/09/2015  . Benign hypertension 02/09/2015  . Blood type A+ 02/09/2015  . Arterial branch occlusion of retina 02/09/2015  . Chronic constipation 02/09/2015  . Insomnia, persistent 02/09/2015  . Chronic combined systolic and diastolic CHF, NYHA class 1 ([place]) 02/09/2015  . Dyslipidemia 02/09/2015  . Atrial fibrillation ([place]) 02/09/2015  . Arthritis urica 02/09/2015  . Hearing loss 02/09/2015  . History of open heart surgery 02/09/2015  . Chronic hoarseness 02/09/2015  . Cardiomegaly 02/09/2015  . Dysmetabolic  syndrome 0000000  . Migraine with aura and without status migrainosus, not intractable 02/09/2015  . Adult BMI 30+ 02/09/2015  . Hypertensive pulmonary vascular disease ([place]) 02/09/2015  . Allergic rhinitis, seasonal 02/09/2015  . Vitamin D deficiency 02/09/2015  . History of mitral valve repair 10/01/2012  . History of aortic valve replacement 10/01/2012  . Congestive heart failure ([place]) 10/01/2012    Past Surgical History:  Procedure Laterality Date  . ABDOMINAL HYSTERECTOMY  1996  . arm surgery  2019  . BREAST BIOPSY Right 1998   neg  . BREAST BIOPSY Right 2007   neg  . BREAST BIOPSY Right 1992   positive  . BREAST EXCISIONAL BIOPSY Left 2000   neg  . CARDIAC VALVE REPLACEMENT  2014  . CATARACT EXTRACTION W/PHACO Left 03/30/2018   Procedure: CATARACT EXTRACTION PHACO AND INTRAOCULAR LENS PLACEMENT (IOC);  Surgeon: Birder Robson, MD;  Location: [facility] ORS;  Service: Ophthalmology;  Laterality: Left;  [place] 00:31.9 AP% 12.1 CDE 3.87 Fluid Pack lot # [id number]  . FRACTURE SURGERY Left    ARM/ LEG  . LEG SURGERY      Family History  Problem Relation Age of Onset  . Cancer Mother   . Diabetes Mother   . Hypertension Mother   . Breast cancer Mother 87  . Hypertension Father   . Heart failure Father   . Cancer Brother        bladder  . Cancer Sister        breast  .  Breast cancer Sister 37  . Hypertension Son   . Hypertension Daughter   . Breast cancer Sister 65    Social History   Socioeconomic History  . Marital status: Widowed    Spouse name: Not on file  . Number of children: 2  . Years of education: Not on file  . Highest education level: 12th grade  Occupational History  . Occupation: cafeteria     Comment: [employer]  Social Needs  . Financial resource strain: Not hard at all  . Food insecurity    Worry: Never true    Inability: Never true  . Transportation needs    Medical: No    Non-medical: No  Tobacco Use  . Smoking status: Former  Smoker    Quit date: 09/09/2007    Years since quitting: 11.8  . Smokeless tobacco: Never Used  Substance and Sexual Activity  . Alcohol use: No    Alcohol/week: 0.0 standard drinks  . Drug use: No  . Sexual activity: Not Currently  Lifestyle  . Physical activity    Days per week: 0 days    Minutes per session: 0 min  . Stress: Only a little  Relationships  . Social connections    Talks on phone: More than three times a week    Gets together: More than three times a week    Attends religious service: More than 4 times per year    Active member of club or organization: Yes    Attends meetings of clubs or organizations: More than 4 times per year    Relationship status: Widowed  . Intimate partner violence    Fear of current or ex partner: No    Emotionally abused: No    Physically abused: No    Forced sexual activity: No  Other Topics Concern  . Not on file  Social History Narrative  . Not on file     Current Outpatient Medications:  .  acetaminophen (TYLENOL) 500 MG tablet, Take 1,000-1,500 mg by mouth every 6 (six) hours as needed for mild pain., Disp: , Rfl:  .  allopurinol (ZYLOPRIM) 300 MG tablet, Take 1 tablet (300 mg total) by mouth daily., Disp: 90 tablet, Rfl: 1 .  clindamycin (CLEOCIN) 300 MG capsule, TAKE 600 MG (2 TABLETS)  1 HOUR PRIOR TO DENTAL PROCEDURE, Disp: , Rfl:  .  diclofenac sodium (VOLTAREN) 1 % GEL, Apply 4 g topically 4 (four) times daily., Disp: 100 g, Rfl: 2 .  Ergocalciferol (VITAMIN D2) 50 MCG (2000 UT) TABS, Take 2,000 Units by mouth daily., Disp: , Rfl:  .  ezetimibe (ZETIA) 10 MG tablet, Take 1 tablet (10 mg total) by mouth daily., Disp: 90 tablet, Rfl: 3 .  fluticasone (FLONASE) 50 MCG/ACT nasal spray, Place 2 sprays into both nostrils daily., Disp: 16 g, Rfl: 6 .  metoprolol tartrate (LOPRESSOR) 25 MG tablet, Take 1 tablet (25 mg total) by mouth 2 (two) times daily., Disp: 180 tablet, Rfl: 1 .  potassium chloride SA (KLOR-CON M20) 20 MEQ  tablet, Take 1 tablet (20 mEq total) by mouth daily., Disp: 90 tablet, Rfl: 1 .  tiotropium (SPIRIVA HANDIHALER) 18 MCG inhalation capsule, INHALE 1 CASPULE VIA HANDIHALER ONCE A DAY AT THE SAME TIME EVERY DAY, Disp: 30 capsule, Rfl: 1 .  torsemide (DEMADEX) 20 MG tablet, Take 1 tablet (20 mg total) by mouth daily., Disp: 90 tablet, Rfl: 1 .  traZODone (DESYREL) 50 MG tablet, Take 1 tablet (50 mg total) by  mouth at bedtime., Disp: 90 tablet, Rfl: 0 .  warfarin (COUMADIN) 5 MG tablet, Take 5 mg by mouth daily., Disp: , Rfl:   Allergies  Allergen Reactions  . Ace Inhibitors Cough and Other (See Comments)  . Amoxicillin-Pot Clavulanate Nausea And Vomiting  . Codeine Nausea And Vomiting  . Penicillins Nausea And Vomiting and Other (See Comments)    Has patient had a PCN reaction causing immediate rash, facial/tongue/throat swelling, SOB or lightheadedness with hypotension: No Has patient had a PCN reaction causing severe rash involving mucus membranes or skin necrosis: No Has patient had a PCN reaction that required hospitalization No Has patient had a PCN reaction occurring within the last 10 years: No If all of the above answers are "NO", then may proceed with Cephalosporin use.  . Rosuvastatin Other (See Comments)    Reaction:  Joint pain   . Tetanus-Diphtheria Toxoids Td Swelling    I personally reviewed active problem list, medication list, allergies, family history, social history, health maintenance with the patient/caregiver today.   ROS  Ten systems reviewed and is negative except as mentioned in HPI   Objective  Vitals:   07/27/19 1413  BP: 132/82  Pulse: 63  Resp: 16  Temp: (!) 97.3 F (36.3 C)  TempSrc: Temporal  SpO2: 98%  Weight: 189 lb 12.8 oz (86.1 kg)  Height: 5\' 3"  (1.6 m)    Body mass index is 33.62 kg/m.  Physical Exam  Constitutional: Patient appears well-developed and well-nourished. Obese  No distress.  HEENT: head atraumatic, normocephalic, pupils  equal and reactive to light Cardiovascular: Normal rate, regular rhythm  Systolic  murmur 2/6 heard. Trace  BLE edema. Pulmonary/Chest: Effort normal and breath sounds normal. No respiratory distress. Abdominal: Soft.  There is no tenderness. Muscular skeletal: crepitus during extension of right knee  Psychiatric: Patient has a normal mood and affect. behavior is normal. Judgment and thought content normal.  Recent Results (from the past 2160 hour(s))  Urine Culture     Status: None   Collection Time: 05/06/19 12:00 AM   Specimen: Urine  Result Value Ref Range   MICRO NUMBER: [id number]    SPECIMEN QUALITY: Adequate    Sample Source URINE, CLEAN CATCH    STATUS: FINAL    Result: No Growth   POCT urinalysis dipstick     Status: Abnormal   Collection Time: 05/06/19  3:14 PM  Result Value Ref Range   Color, UA drk yellow    Clarity, UA clear    Glucose, UA Negative Negative   Bilirubin, UA neg    Ketones, UA neg    Spec Grav, UA 1.025 1.010 - 1.025   Blood, UA positive    pH, UA 6.5 5.0 - 8.0   Protein, UA Negative Negative   Urobilinogen, UA 0.2 0.2 or 1.0 E.U./dL   Nitrite, UA neg    Leukocytes, UA Large (3+) (A) Negative   Appearance clear    Odor normal   Cologuard     Status: None   Collection Time: 06/23/19 12:00 AM  Result Value Ref Range   Cologuard Negative Negative  Novel Coronavirus, NAA ([facility])     Status: None   Collection Time: 06/28/19 11:25 AM   Specimen: Oropharyngeal(OP) collection in vial transport medium   OROPHARYNGEA  TESTING  Result Value Ref Range   SARS-CoV-2, NAA Not Detected Not Detected    Comment: This nucleic acid amplification test was developed and its performance characteristics determined by [employer]. Nucleic acid  amplification tests include PCR and TMA. This test has not been FDA cleared or approved. This test has been authorized by FDA under an Emergency Use Authorization (EUA). This test is only authorized for the duration  of time the declaration that circumstances exist justifying the authorization of the emergency use of in vitro diagnostic tests for detection of SARS-CoV-2 virus and/or diagnosis of COVID-19 infection under section 564(b)(1) of the Act, 21 U.S.C. PT:2852782) (1), unless the authorization is terminated or revoked sooner. When diagnostic testing is negative, the possibility of a false negative result should be considered in the context of a patient's recent exposures and the presence of clinical signs and symptoms consistent with COVID-19. An individual without symptoms of COVID-19 and who is not shedding SARS-CoV-2 virus would  expect to have a negative (not detected) result in this assay.     PHQ2/9: Depression screen [facility] 2/9 07/27/2019 06/22/2019 06/14/2019 05/06/2019 03/22/2019  Decreased Interest 0 0 0 0 0  Down, Depressed, Hopeless 0 0 0 0 0  PHQ - 2 Score 0 0 0 0 0  Altered sleeping 0 0 0 0 0  Tired, decreased energy 0 0 0 0 0  Change in appetite 0 0 0 0 0  Feeling bad or failure about yourself  0 0 0 0 0  Trouble concentrating 0 0 0 0 0  Moving slowly or fidgety/restless 0 0 0 0 0  Suicidal thoughts 0 0 0 0 0  PHQ-9 Score 0 0 0 0 0  Difficult doing work/chores Not difficult at all Not difficult at all - Not difficult at all -  Some recent data might be hidden    phq 9 is negative   Fall Risk: Fall Risk  07/27/2019 06/22/2019 05/06/2019 03/22/2019 11/30/2018  Falls in the past year? 0 0 0 0 0  Number falls in past yr: 0 0 0 0 0  Injury with Fall? 0 0 0 0 0  Comment - - - - -  Risk for fall due to : - - - - -  Follow up - - - - -     Functional Status Survey: Is the patient deaf or have difficulty hearing?: No Does the patient have difficulty seeing, even when wearing glasses/contacts?: Yes Does the patient have difficulty concentrating, remembering, or making decisions?: No Does the patient have difficulty walking or climbing stairs?: No Does the patient have  difficulty dressing or bathing?: No Does the patient have difficulty doing errands alone such as visiting a doctor's office or shopping?: No    Assessment & Plan  1. Chronic combined systolic and diastolic congestive heart failure (HCC)  - COMPLETE METABOLIC PANEL WITH GFR - CBC with Differential/Platelet  2. Dysmetabolic syndrome  - metFORMIN (GLUCOPHAGE) 500 MG tablet; Take 1 tablet (500 mg total) by mouth 2 (two) times daily with a meal.  Dispense: 180 tablet; Refill: 0  3. Hyperglycemia  - Hemoglobin A1c  4. Primary osteoarthritis of both knees  Keep follow up with PT

## 2019-07-31 ENCOUNTER — Other Ambulatory Visit: Payer: Self-pay | Admitting: Family Medicine

## 2019-07-31 DIAGNOSIS — I5042 Chronic combined systolic (congestive) and diastolic (congestive) heart failure: Secondary | ICD-10-CM

## 2019-07-31 DIAGNOSIS — I10 Essential (primary) hypertension: Secondary | ICD-10-CM

## 2019-08-03 DIAGNOSIS — I480 Paroxysmal atrial fibrillation: Secondary | ICD-10-CM | POA: Diagnosis not present

## 2019-08-03 DIAGNOSIS — Z9889 Other specified postprocedural states: Secondary | ICD-10-CM | POA: Diagnosis not present

## 2019-08-18 ENCOUNTER — Other Ambulatory Visit: Payer: Self-pay

## 2019-08-18 ENCOUNTER — Ambulatory Visit: Payer: Medicare Other | Attending: Family Medicine

## 2019-08-18 DIAGNOSIS — M25562 Pain in left knee: Secondary | ICD-10-CM | POA: Insufficient documentation

## 2019-08-18 DIAGNOSIS — M6281 Muscle weakness (generalized): Secondary | ICD-10-CM | POA: Diagnosis not present

## 2019-08-18 DIAGNOSIS — G8929 Other chronic pain: Secondary | ICD-10-CM | POA: Diagnosis not present

## 2019-08-18 DIAGNOSIS — M25551 Pain in right hip: Secondary | ICD-10-CM | POA: Diagnosis not present

## 2019-08-18 NOTE — Therapy (Signed)
[facility] [address] [place], [address] Phone: [phone]   Fax:  [phone]  Physical Therapy Evaluation  Patient Details  Name: [name] MRN: [id number] Date of Birth: 01-23-1941 Referring Provider (PT): [name], MD   Encounter Date: 08/18/2019  PT End of Session - 08/18/19 1655    Visit Number  1    Number of Visits  13    Date for PT Re-Evaluation  09/29/19    Authorization Type  1    Authorization Time Period  10    PT Start Time  1655    PT Stop Time  1743    PT Time Calculation (min)  48 min    Activity Tolerance  Patient tolerated treatment well    Behavior During Therapy  [facility] for tasks assessed/performed       Past Medical History:  Diagnosis Date  . Atrial fibrillation ([place])   . Breast cancer ([place]) 1992   positive, radiation  . CHF (congestive heart failure) ([place])   . COPD (chronic obstructive pulmonary disease) ([place])   . Dysrhythmia   . Gout   . History of aortic valve replacement   . Hyperlipidemia   . Hypertension     Past Surgical History:  Procedure Laterality Date  . ABDOMINAL HYSTERECTOMY  1996  . arm surgery  2019  . BREAST BIOPSY Right 1998   neg  . BREAST BIOPSY Right 2007   neg  . BREAST BIOPSY Right 1992   positive  . BREAST EXCISIONAL BIOPSY Left 2000   neg  . CARDIAC VALVE REPLACEMENT  2014  . CATARACT EXTRACTION W/PHACO Left 03/30/2018   Procedure: CATARACT EXTRACTION PHACO AND INTRAOCULAR LENS PLACEMENT (IOC);  Surgeon: Birder Robson, MD;  Location: [facility] ORS;  Service: Ophthalmology;  Laterality: Left;  [place] 00:31.9 AP% 12.1 CDE 3.87 Fluid Pack lot # [id number]  . FRACTURE SURGERY Left    ARM/ LEG  . LEG SURGERY      There were no vitals filed for this visit.   Subjective Assessment - 08/18/19 1657    Subjective  R hip pain: 0/10 currently, 8/10 at worst for the past 3 months. L knee: 0/10 currently, 10/10 at worst for the past 3  months.    Pertinent History  L knee OA, R hip bursitis. Pt states she broke her L knee about 2 years ago. Also Had R hip pain which is most likely due to compensation. Has a hard time standing standing up from a chair and couch. Pt was sweeping her porch about 2 years ago and fell, fracturing her L knee and arm and wrist.  Pt took atorvastatin which caused muscle pain. Stopped taking the medication and muscles feel better. R hip pain has been there for years. Had PT for it which helped. Also sleeps on her R side as well. B knees feel better after stopping the atorvastatin. R hip bothers her more now instead.    Patient Stated Goals  Get up from a chair more comfortably and more easily.    Currently in Pain?  No/denies    Pain Score  0-No pain    Pain Orientation  Left;Right    Pain Descriptors / Indicators  Tightness;Sore    Pain Type  Chronic pain    Pain Onset  More than a month ago    Pain Frequency  Occasional    Aggravating Factors   R hip: sitting down for a while,  then standing up and initial walking, getting into and out of the driver's seat in her car. Prolonged static positions.  L knee: kneeling on it.    Pain Relieving Factors  walking and moving around         [facility] PT Assessment - 08/18/19 1714      Assessment   Medical Diagnosis  L knee OA, R hip trochanteric bursitis    Referring Provider (PT)  [name], MD    Onset Date/Surgical Date  06/22/19   Date PT referral signed.    Prior Therapy  Pt had prior PT for R hip bursitis and L knee fx with good results      Precautions   Precaution Comments  no known precautions      Restrictions   Other Position/Activity Restrictions  no known restrictions      Balance Screen   Has the patient fallen in the past 6 months  No    Has the patient had a decrease in activity level because of a fear of falling?   No    Is the patient reluctant to leave their home because of a fear of falling?   No      Home Environment    Additional Comments  Pt lives in a 1 story home alone, 2 steps to enter front door R rail. 5 steps to enter back door, no rails.  1 step in her den area R rail.       Prior Function   Vocation Requirements  PLOF: better able to perform sit <> stand transfers from a couch with less difficulty       Posture/Postural Control   Posture Comments  B protracted shoulders and neck, R shoulder lower, increased lordosis, B genu valgus L > R      AROM   Left Knee Extension  0    Left Knee Flexion  115   120 AAROM   Lumbar Flexion  WFL    Lumbar Extension  WFL    Lumbar - Right Side Bend  WFL    Lumbar - Left Side Bend  WFL    Lumbar - Right Rotation  WFL    Lumbar - Left Rotation  [facility]      Strength   Right Hip Flexion  4-/5    Right Hip Extension  4/5    Right Hip ABduction  4/5   no pain   Left Hip Flexion  4-/5    Left Hip Extension  4-/5    Left Hip ABduction  4-/5    Right Knee Flexion  5/5    Right Knee Extension  5/5    Left Knee Flexion  4+/5    Left Knee Extension  5/5      Palpation   Palpation comment  TTP R greater trochanter; no TTP L knee      Special Tests   Other special tests  (-) Ober's test R LE (both knee bend and leg straight)      Ambulation/Gait   Gait Comments  lateral lean during stance phase                Objective measurements completed on examination: See above findings.   BP controlled per pt.   try hip abducion clamshell isometrics next visit.     Patient is a 78 year old female who came to physical therapy secondary to L knee and R lateral hip pain. She also presents with TTP R lateral  hip at the greater trochanter and glute med muscle distal insertion, bilateral hip weakness, lateral lean during gait and difficulty performing tasks such as standing up after prolonged sitting. Pt will benefit from skilled physical therapy services to address the aforementioned deficits.          PT Education - 08/18/19 1805    Education  provided  Yes    Education Details  plan of care    Person(s) Educated  Patient    Methods  Explanation    Comprehension  Verbalized understanding       PT Short Term Goals - 08/18/19 1756      PT SHORT TERM GOAL #1   Title  Patient will be independent with her HEP to decrease pain, improve strength and ability to perform sit <> stand transfers.    Time  3    Period  Weeks    Status  New    Target Date  09/08/19        PT Long Term Goals - 08/18/19 1757      PT LONG TERM GOAL #1   Title  Patient will have a decrease in R lateral hip pain to 2/10 or less at worst to promote ability to get up from a low surface more comfortably.    Baseline  8/10 R lateral hip pain at worst for the past 3 months (08/18/2019)    Time  6    Period  Weeks    Status  New    Target Date  09/29/19      PT LONG TERM GOAL #2   Title  Patient will have a decrease in L knee pain to 4/10 or less at worst to promote ability to stand up from sitting on a low surface with less difficulty.    Baseline  10/10 L knee pain at worst for the past 3 months (08/18/2019)    Time  6    Period  Weeks    Status  New    Target Date  09/29/19      PT LONG TERM GOAL #3   Title  Patient will improve bilateral hip extension and hip abduction strength by at least 1/2 MMT grade to promote ability to perform transfers with less difficulty.    Time  6    Period  Weeks    Status  New    Target Date  09/29/19      PT LONG TERM GOAL #4   Title  Pt will report minimal to no difficulty standing up after sitting on the couch for about 20 minutes as a demonstration of improved ability to perform transfers.    Baseline  Increased R lateral hip pain with standing up from a couch after prolonged sitting (08/18/2019)    Time  6    Period  Weeks    Status  New    Target Date  09/29/19             Plan - 08/18/19 1744    Clinical Impression Statement  Patient is a 78 year old female who came to physical therapy secondary  to L knee and R lateral hip pain. She also presents with TTP R lateral hip at the greater trochanter and glute med muscle distal insertion, bilateral hip weakness, lateral lean during gait and difficulty perform tasks such as standing up after prolonged sitting. Pt will benefit from skilled physical therapy services to address the aforementioned deficits.    Personal Factors and  Comorbidities  Age;Comorbidity 3+    Comorbidities  CHF, HTN, COPD, hx of aortic valve replacement, hx of breast CA    Examination-Activity Limitations  Transfers    Stability/Clinical Decision Making  Stable/Uncomplicated    Clinical Decision Making  Low    Rehab Potential  Good    Clinical Impairments Affecting Rehab Potential  (+) motivated, good response to previous therapies, (-) age, chronicity of condition    PT Frequency  2x / week    PT Duration  6 weeks    PT Treatment/Interventions  Iontophoresis 4mg /ml Dexamethasone;Patient/family education;Electrical Stimulation;Neuromuscular re-education;Manual techniques;Therapeutic exercise;Gait training;Therapeutic activities;Balance training;Aquatic Therapy;Functional mobility training;Dry needling    PT Next Visit Plan  isometric R glute med and max strengthening, B hip strengthening, lumbopelvic and femoral control, manual techniques, modalities PRN    PT Home Exercise Plan  --    Consulted and Agree with Plan of Care  Patient       Patient will benefit from skilled therapeutic intervention in order to improve the following deficits and impairments:  Pain, Improper body mechanics, Postural dysfunction, Decreased strength  Visit Diagnosis: Pain in right hip - Plan: PT plan of care cert/re-cert  Chronic pain of left knee - Plan: PT plan of care cert/re-cert  Muscle weakness (generalized) - Plan: PT plan of care cert/re-cert     Problem List Patient Active Problem List   Diagnosis Date Noted  . On Coumadin for atrial fibrillation ([place]) 06/30/2018  . Chronic  anticoagulation 08/27/2017  . History of right breast cancer 08/27/2017  . Chronic obstructive pulmonary disease ([place]) 07/04/2015  . Controlled gout 06/06/2015  . Anxiety 02/09/2015  . Synovial cyst of popliteal space 02/09/2015  . Benign hypertension 02/09/2015  . Blood type A+ 02/09/2015  . Arterial branch occlusion of retina 02/09/2015  . Chronic constipation 02/09/2015  . Insomnia, persistent 02/09/2015  . Chronic combined systolic and diastolic CHF, NYHA class 1 ([place]) 02/09/2015  . Dyslipidemia 02/09/2015  . Atrial fibrillation ([place]) 02/09/2015  . Arthritis urica 02/09/2015  . Hearing loss 02/09/2015  . History of open heart surgery 02/09/2015  . Chronic hoarseness 02/09/2015  . Cardiomegaly 02/09/2015  . Dysmetabolic syndrome 0000000  . Migraine with aura and without status migrainosus, not intractable 02/09/2015  . Adult BMI 30+ 02/09/2015  . Hypertensive pulmonary vascular disease ([place]) 02/09/2015  . Allergic rhinitis, seasonal 02/09/2015  . Vitamin D deficiency 02/09/2015  . History of mitral valve repair 10/01/2012  . History of aortic valve replacement 10/01/2012  . Congestive heart failure ([place]) 10/01/2012    Joneen Boers PT, DPT   08/18/2019, 6:14 PM  [facility] [address] [place], [address] Phone: [phone]   Fax:  [phone]  Name: [name] MRN: [id number] Date of Birth: 07-28-1941

## 2019-08-22 ENCOUNTER — Ambulatory Visit: Payer: Medicare Other

## 2019-08-22 ENCOUNTER — Other Ambulatory Visit: Payer: Self-pay

## 2019-08-22 DIAGNOSIS — G8929 Other chronic pain: Secondary | ICD-10-CM

## 2019-08-22 DIAGNOSIS — M25551 Pain in right hip: Secondary | ICD-10-CM

## 2019-08-22 DIAGNOSIS — M6281 Muscle weakness (generalized): Secondary | ICD-10-CM | POA: Diagnosis not present

## 2019-08-22 DIAGNOSIS — M25562 Pain in left knee: Secondary | ICD-10-CM

## 2019-08-22 NOTE — Therapy (Signed)
[facility] [address], [place], [address] Phone: [phone]   Fax:  [phone]  Physical Therapy Treatment  Patient Details  Name: [name] MRN: [id number] Date of Birth: [birth year] Referring Provider (PT): [name], MD   Encounter Date: 08/22/2019  PT End of Session - 08/22/19 1622    Visit Number  2    Number of Visits  13    Date for PT Re-Evaluation  09/29/19    Authorization Type  2    Authorization Time Period  10    PT Start Time  1622    PT Stop Time  1706    PT Time Calculation (min)  44 min    Activity Tolerance  Patient tolerated treatment well    Behavior During Therapy  [facility] for tasks assessed/performed       Past Medical History:  Diagnosis Date  . Atrial fibrillation ([place])   . Breast cancer ([place]) 1992   positive, radiation  . CHF (congestive heart failure) ([place])   . COPD (chronic obstructive pulmonary disease) ([place])   . Dysrhythmia   . Gout   . History of aortic valve replacement   . Hyperlipidemia   . Hypertension     Past Surgical History:  Procedure Laterality Date  . ABDOMINAL HYSTERECTOMY  1996  . arm surgery  2019  . BREAST BIOPSY Right 1998   neg  . BREAST BIOPSY Right 2007   neg  . BREAST BIOPSY Right 1992   positive  . BREAST EXCISIONAL BIOPSY Left 2000   neg  . CARDIAC VALVE REPLACEMENT  2014  . CATARACT EXTRACTION W/PHACO Left 03/30/2018   Procedure: CATARACT EXTRACTION PHACO AND INTRAOCULAR LENS PLACEMENT (IOC);  Surgeon: Birder Robson, MD;  Location: [facility] ORS;  Service: Ophthalmology;  Laterality: Left;  [place] 00:31.9 AP% 12.1 CDE 3.87 Fluid Pack lot # [id number]  . FRACTURE SURGERY Left    ARM/ LEG  . LEG SURGERY      There were no vitals filed for this visit.  Subjective Assessment - 08/22/19 1623    Subjective  Had a death in the family, and will not be able to make it Wednesday. L knee is great since she stopped taking statin. No R  lateral hip pain currently.    Pertinent History  L knee OA, R hip bursitis. Pt states she broke her L knee about 2 years ago. Also Had R hip pain which is most likely due to compensation. Has a hard time standing standing up from a chair and couch. Pt was sweeping her porch about 2 years ago and fell, fracturing her L knee and arm and wrist.  Pt took atorvastatin which caused muscle pain. Stopped taking the medication and muscles feel better. R hip pain has been there for years. Had PT for it which helped. Also sleeps on her R side as well. B knees feel better after stopping the atorvastatin. R hip bothers her more now instead.    Patient Stated Goals  Get up from a chair more comfortably and more easily.    Currently in Pain?  No/denies    Pain Score  0-No pain    Pain Onset  More than a month ago                               PT Education - 08/22/19 1638    Education provided  Yes    Education Details  ther-ex, HEP    Person(s) Educated  Patient    Methods  Explanation;Demonstration;Tactile cues;Verbal cues;Handout    Comprehension  Returned demonstration;Verbalized understanding      Objectives  Therapeutic exercise  seated hip abduction/clamshell (hips less than 90 degrees flexion) isometrics, 40% effort for 1 minute with 1 minute rest breaks 5x  Seated R hip extension isometrics 10x5 seconds for 3 sets  Sit <> stand from chair height without UE assist 5x. Patellar crepitus, no complain of pain.   Seated hip adduction ball and glute max squeeze 10x5 seconds for 2 sets  S/L hip abduction   R 10x    4/10 R lateral hip tenderness   R hip abduction isometrics, with PT maintaining neutral R LE position 10x5 seconds for 3 sets  No R lateral hip tenderness sensation at rest afterwards  Ascending and descending 4 regular steps with B UE assist. Decreased B femoral control and pt demonstrates step to pattern when descending stairs.   Then with cues for femoral  control 2x  Forward step up onto 1st regular step with L LE and B UE assist 10x, emphasis on femoral control  Patellar crepitus, no complain of pain  Pt was recommended to avoid positions which cause adduction of R hip past neutral to decrease stress to trochanteric bursa and glute med muscle. Pt verbalized understanding.     Improved exercise technique, movement at target joints, use of target muscles after mod verbal, visual, tactile cues.      Response to treatment  Decreased R lateral hip discomfort with isometric glute med muscle contraction. Pt states R hip felt like it got a good work out.    Clinical impression Worked on isometric glute med muscle contraction at R hip to promote proper stress to glute med tendon for pain control and to promote proper healing. Also worked on femoral control to help decrease stress to L knee and R lateral hip. Pt tolerated session well without aggravation of symptoms. Pt will benefit from continued skilled physical therapy services to decrease pain, improve strength and function.          PT Short Term Goals - 08/18/19 1756      PT SHORT TERM GOAL #1   Title  Patient will be independent with her HEP to decrease pain, improve strength and ability to perform sit <> stand transfers.    Time  3    Period  Weeks    Status  New    Target Date  09/08/19        PT Long Term Goals - 08/18/19 1757      PT LONG TERM GOAL #1   Title  Patient will have a decrease in R lateral hip pain to 2/10 or less at worst to promote ability to get up from a low surface more comfortably.    Baseline  8/10 R lateral hip pain at worst for the past 3 months (08/18/2019)    Time  6    Period  Weeks    Status  New    Target Date  09/29/19      PT LONG TERM GOAL #2   Title  Patient will have a decrease in L knee pain to 4/10 or less at worst to promote ability to stand up from sitting on a low surface with less difficulty.    Baseline  10/10 L knee pain at worst  for the past 3 months (08/18/2019)  Time  6    Period  Weeks    Status  New    Target Date  09/29/19      PT LONG TERM GOAL #3   Title  Patient will improve bilateral hip extension and hip abduction strength by at least 1/2 MMT grade to promote ability to perform transfers with less difficulty.    Time  6    Period  Weeks    Status  New    Target Date  09/29/19      PT LONG TERM GOAL #4   Title  Pt will report minimal to no difficulty standing up after sitting on the couch for about 20 minutes as a demonstration of improved ability to perform transfers.    Baseline  Increased R lateral hip pain with standing up from a couch after prolonged sitting (08/18/2019)    Time  6    Period  Weeks    Status  New    Target Date  09/29/19            Plan - 08/22/19 1647    Clinical Impression Statement  Worked on isometric glute med muscle contraction at R hip to promote proper stress to glute med tendon for pain control and to promote proper healing. Also worked on femoral control to help decrease stress to L knee and R lateral hip. Pt tolerated session well without aggravation of symptoms. Pt will benefit from continued skilled physical therapy services to decrease pain, improve strength and function.    Personal Factors and Comorbidities  Age;Comorbidity 3+    Comorbidities  CHF, HTN, COPD, hx of aortic valve replacement, hx of breast CA    Examination-Activity Limitations  Transfers    Stability/Clinical Decision Making  Stable/Uncomplicated    Rehab Potential  Good    Clinical Impairments Affecting Rehab Potential  (+) motivated, good response to previous therapies, (-) age, chronicity of condition    PT Frequency  2x / week    PT Duration  6 weeks    PT Treatment/Interventions  Iontophoresis 4mg /ml Dexamethasone;Patient/family education;Electrical Stimulation;Neuromuscular re-education;Manual techniques;Therapeutic exercise;Gait training;Therapeutic activities;Balance  training;Aquatic Therapy;Functional mobility training;Dry needling    PT Next Visit Plan  isometric R glute med and max strengthening, B hip strengthening, lumbopelvic and femoral control, manual techniques, modalities PRN    Consulted and Agree with Plan of Care  Patient       Patient will benefit from skilled therapeutic intervention in order to improve the following deficits and impairments:  Pain, Improper body mechanics, Postural dysfunction, Decreased strength  Visit Diagnosis: Pain in right hip  Chronic pain of left knee  Muscle weakness (generalized)     Problem List Patient Active Problem List   Diagnosis Date Noted  . On Coumadin for atrial fibrillation ([place]) 06/30/2018  . Chronic anticoagulation 08/27/2017  . History of right breast cancer 08/27/2017  . Chronic obstructive pulmonary disease ([place]) 07/04/2015  . Controlled gout 06/06/2015  . Anxiety 02/09/2015  . Synovial cyst of popliteal space 02/09/2015  . Benign hypertension 02/09/2015  . Blood type A+ 02/09/2015  . Arterial branch occlusion of retina 02/09/2015  . Chronic constipation 02/09/2015  . Insomnia, persistent 02/09/2015  . Chronic combined systolic and diastolic CHF, NYHA class 1 ([place]) 02/09/2015  . Dyslipidemia 02/09/2015  . Atrial fibrillation ([place]) 02/09/2015  . Arthritis urica 02/09/2015  . Hearing loss 02/09/2015  . History of open heart surgery 02/09/2015  . Chronic hoarseness 02/09/2015  . Cardiomegaly 02/09/2015  . Dysmetabolic syndrome  02/09/2015  . Migraine with aura and without status migrainosus, not intractable 02/09/2015  . Adult BMI 30+ 02/09/2015  . Hypertensive pulmonary vascular disease ([place]) 02/09/2015  . Allergic rhinitis, seasonal 02/09/2015  . Vitamin D deficiency 02/09/2015  . History of mitral valve repair 10/01/2012  . History of aortic valve replacement 10/01/2012  . Congestive heart failure ([place]) 10/01/2012    Joneen Boers PT, DPT   08/22/2019, 5:29 PM  [facility] [address], [place], [address] Phone: [phone]   Fax:  [phone]  Name: HUGH KENTON MRN: [id number] Date of Birth: 04-08-1941

## 2019-08-22 NOTE — Patient Instructions (Signed)
Seated clamshell isometrics  Sitting on your bed (hips less than 90 degrees flexion)   Belt around thighs, which are shoulder width apart,    Press knees out against belt with 40 % effort for 1 minute   Rest for 1-2 minutes   Perform 5 times per set.     Do 3 sets per day. Each set to be performed a few hours apart.

## 2019-08-24 ENCOUNTER — Ambulatory Visit: Payer: Medicare Other

## 2019-08-25 ENCOUNTER — Other Ambulatory Visit: Payer: Self-pay

## 2019-08-25 ENCOUNTER — Other Ambulatory Visit: Payer: Self-pay | Admitting: Family Medicine

## 2019-08-25 ENCOUNTER — Ambulatory Visit: Payer: Medicare Other

## 2019-08-25 DIAGNOSIS — E8881 Metabolic syndrome: Secondary | ICD-10-CM

## 2019-08-25 DIAGNOSIS — G8929 Other chronic pain: Secondary | ICD-10-CM | POA: Diagnosis not present

## 2019-08-25 DIAGNOSIS — M25551 Pain in right hip: Secondary | ICD-10-CM | POA: Diagnosis not present

## 2019-08-25 DIAGNOSIS — M25562 Pain in left knee: Secondary | ICD-10-CM

## 2019-08-25 DIAGNOSIS — M6281 Muscle weakness (generalized): Secondary | ICD-10-CM | POA: Diagnosis not present

## 2019-08-25 DIAGNOSIS — Z1231 Encounter for screening mammogram for malignant neoplasm of breast: Secondary | ICD-10-CM

## 2019-08-25 NOTE — Therapy (Signed)
[facility] [address], [place], [address] Phone: [phone]   Fax:  [phone]  Physical Therapy Treatment  Patient Details  Name: [name] MRN: [id number] Date of Birth: [birth year]  Referring Provider (PT): [name], MD   Encounter Date: 08/25/2019  PT End of Session - 08/25/19 1517    Visit Number  3    Number of Visits  13    Date for PT Re-Evaluation  09/29/19    Authorization Type  3    Authorization Time Period  10    PT Start Time  1517    PT Stop Time  1558    PT Time Calculation (min)  41 min    Activity Tolerance  Patient tolerated treatment well    Behavior During Therapy  [facility] for tasks assessed/performed       Past Medical History:  Diagnosis Date  . Atrial fibrillation ([place])   . Breast cancer ([place]) 1992   positive, radiation  . CHF (congestive heart failure) ([place])   . COPD (chronic obstructive pulmonary disease) ([place])   . Dysrhythmia   . Gout   . History of aortic valve replacement   . Hyperlipidemia   . Hypertension     Past Surgical History:  Procedure Laterality Date  . ABDOMINAL HYSTERECTOMY  1996  . arm surgery  2019  . BREAST BIOPSY Right 1998   neg  . BREAST BIOPSY Right 2007   neg  . BREAST BIOPSY Right 1992   positive  . BREAST EXCISIONAL BIOPSY Left 2000   neg  . CARDIAC VALVE REPLACEMENT  2014  . CATARACT EXTRACTION W/PHACO Left 03/30/2018   Procedure: CATARACT EXTRACTION PHACO AND INTRAOCULAR LENS PLACEMENT (IOC);  Surgeon: Birder Robson, MD;  Location: [facility] ORS;  Service: Ophthalmology;  Laterality: Left;  [place] 00:31.9 AP% 12.1 CDE 3.87 Fluid Pack lot # [id number]  . FRACTURE SURGERY Left    ARM/ LEG  . LEG SURGERY      There were no vitals filed for this visit.  Subjective Assessment - 08/25/19 1518    Subjective  R hip is ok. No R hip pain currently. A little sore the day after session which went away by the end of the day. L knee is  stil doing good.    Pertinent History  L knee OA, R hip bursitis. Pt states she broke her L knee about 2 years ago. Also Had R hip pain which is most likely due to compensation. Has a hard time standing standing up from a chair and couch. Pt was sweeping her porch about 2 years ago and fell, fracturing her L knee and arm and wrist.  Pt took atorvastatin which caused muscle pain. Stopped taking the medication and muscles feel better. R hip pain has been there for years. Had PT for it which helped. Also sleeps on her R side as well. B knees feel better after stopping the atorvastatin. R hip bothers her more now instead.    Patient Stated Goals  Get up from a chair more comfortably and more easily.    Currently in Pain?  No/denies    Pain Score  0-No pain    Pain Onset  More than a month ago                               PT Education - 08/25/19 1530    Education  provided  Yes    Education Details  ther-ex    Person(s) Educated  Patient    Methods  Explanation;Demonstration;Tactile cues;Verbal cues    Comprehension  Returned demonstration;Verbalized understanding        Objectives  Therapeutic exercise  Seated R hip extension isometrics 10x10 seconds for 2 sets  Seated R hip ER 10x5 seconds   Seated hip adduction ball and glute max squeeze 10x5 seconds for 2 sets   S/L R hip abduction isometrics, with PT maintaining neutral R LE position 10x5 seconds for 3 sets  Supine R hip extension isometrics. R knee straight 10x5 seconds to promote glute max muscle strength  Standing hip abduction with B UE assist  R 10x2, then 10x5 seconds   L 10x2, then 10x5 seconds (for closed chain R glute med strength isometrics)   Side stepping 5 ft to the R and 5 ft to the L with emphasis on LE not adducting past neutral   6x  Forward step up onto 4 inch step with R LE with emphasis on femoral control 10x to decrease lateral stress to R hip.       Improved exercise  technique, movement at target joints, use of target muscles after mod verbal, visual, tactile cues.      Response to treatment  Pt tolerated session well without aggravtion of symptoms   Clinical impression Continued working on isometric and concentric R glute med and max strengthening to promote proper stress to tendon for proper healing as well as to promote femoral control and decrease lateral stress to hip with closed chain tasks. Pt tolerated session well without aggravation of symptoms. Pt will benefit from continued skilled physical therapy services to decrease pain, improve strength and function.     PT Short Term Goals - 08/18/19 1756      PT SHORT TERM GOAL #1   Title  Patient will be independent with her HEP to decrease pain, improve strength and ability to perform sit <> stand transfers.    Time  3    Period  Weeks    Status  New    Target Date  09/08/19        PT Long Term Goals - 08/18/19 1757      PT LONG TERM GOAL #1   Title  Patient will have a decrease in R lateral hip pain to 2/10 or less at worst to promote ability to get up from a low surface more comfortably.    Baseline  8/10 R lateral hip pain at worst for the past 3 months (08/18/2019)    Time  6    Period  Weeks    Status  New    Target Date  09/29/19      PT LONG TERM GOAL #2   Title  Patient will have a decrease in L knee pain to 4/10 or less at worst to promote ability to stand up from sitting on a low surface with less difficulty.    Baseline  10/10 L knee pain at worst for the past 3 months (08/18/2019)    Time  6    Period  Weeks    Status  New    Target Date  09/29/19      PT LONG TERM GOAL #3   Title  Patient will improve bilateral hip extension and hip abduction strength by at least 1/2 MMT grade to promote ability to perform transfers with less difficulty.    Time  6  Period  Weeks    Status  New    Target Date  09/29/19      PT LONG TERM GOAL #4   Title  Pt will report  minimal to no difficulty standing up after sitting on the couch for about 20 minutes as a demonstration of improved ability to perform transfers.    Baseline  Increased R lateral hip pain with standing up from a couch after prolonged sitting (08/18/2019)    Time  6    Period  Weeks    Status  New    Target Date  09/29/19            Plan - 08/25/19 1531    Clinical Impression Statement  Continued working on isometric and concentric R glute med and max strengthening to promote proper stress to tendon for proper healing as well as to promote femoral control and decrease lateral stress to hip with closed chain tasks. Pt tolerated session well without aggravation of symptoms. Pt will benefit from continued skilled physical therapy services to decrease pain, improve strength and function.    Personal Factors and Comorbidities  Age;Comorbidity 3+    Comorbidities  CHF, HTN, COPD, hx of aortic valve replacement, hx of breast CA    Examination-Activity Limitations  Transfers    Stability/Clinical Decision Making  Stable/Uncomplicated    Rehab Potential  Good    Clinical Impairments Affecting Rehab Potential  (+) motivated, good response to previous therapies, (-) age, chronicity of condition    PT Frequency  2x / week    PT Duration  6 weeks    PT Treatment/Interventions  Iontophoresis 4mg /ml Dexamethasone;Patient/family education;Electrical Stimulation;Neuromuscular re-education;Manual techniques;Therapeutic exercise;Gait training;Therapeutic activities;Balance training;Aquatic Therapy;Functional mobility training;Dry needling    PT Next Visit Plan  isometric R glute med and max strengthening, B hip strengthening, lumbopelvic and femoral control, manual techniques, modalities PRN    Consulted and Agree with Plan of Care  Patient       Patient will benefit from skilled therapeutic intervention in order to improve the following deficits and impairments:  Pain, Improper body mechanics, Postural  dysfunction, Decreased strength  Visit Diagnosis: Pain in right hip  Chronic pain of left knee  Muscle weakness (generalized)     Problem List Patient Active Problem List   Diagnosis Date Noted  . On Coumadin for atrial fibrillation ([place]) 06/30/2018  . Chronic anticoagulation 08/27/2017  . History of right breast cancer 08/27/2017  . Chronic obstructive pulmonary disease ([place]) 07/04/2015  . Controlled gout 06/06/2015  . Anxiety 02/09/2015  . Synovial cyst of popliteal space 02/09/2015  . Benign hypertension 02/09/2015  . Blood type A+ 02/09/2015  . Arterial branch occlusion of retina 02/09/2015  . Chronic constipation 02/09/2015  . Insomnia, persistent 02/09/2015  . Chronic combined systolic and diastolic CHF, NYHA class 1 ([place]) 02/09/2015  . Dyslipidemia 02/09/2015  . Atrial fibrillation ([place]) 02/09/2015  . Arthritis urica 02/09/2015  . Hearing loss 02/09/2015  . History of open heart surgery 02/09/2015  . Chronic hoarseness 02/09/2015  . Cardiomegaly 02/09/2015  . Dysmetabolic syndrome 0000000  . Migraine with aura and without status migrainosus, not intractable 02/09/2015  . Adult BMI 30+ 02/09/2015  . Hypertensive pulmonary vascular disease ([place]) 02/09/2015  . Allergic rhinitis, seasonal 02/09/2015  . Vitamin D deficiency 02/09/2015  . History of mitral valve repair 10/01/2012  . History of aortic valve replacement 10/01/2012  . Congestive heart failure ([place]) 10/01/2012    Joneen Boers PT, DPT   08/25/2019, 5:10  PM  [facility] [address] [place], [address] Phone: [phone]   Fax:  [phone]  Name: KAILEENA MINAYA MRN: [id number] Date of Birth: 12/30/1940

## 2019-08-29 ENCOUNTER — Other Ambulatory Visit: Payer: Self-pay

## 2019-08-29 ENCOUNTER — Ambulatory Visit: Payer: Medicare Other

## 2019-08-29 DIAGNOSIS — M25562 Pain in left knee: Secondary | ICD-10-CM | POA: Diagnosis not present

## 2019-08-29 DIAGNOSIS — M6281 Muscle weakness (generalized): Secondary | ICD-10-CM | POA: Diagnosis not present

## 2019-08-29 DIAGNOSIS — G8929 Other chronic pain: Secondary | ICD-10-CM | POA: Diagnosis not present

## 2019-08-29 DIAGNOSIS — M25551 Pain in right hip: Secondary | ICD-10-CM | POA: Diagnosis not present

## 2019-08-29 NOTE — Therapy (Signed)
[facility] [address], [place], [address] Phone: [phone]   Fax:  [phone]  Physical Therapy Treatment  Patient Details  Name: [name] MRN: [id number] Date of Birth: [birth year] Referring Provider (PT): [name], MD   Encounter Date: 08/29/2019  PT End of Session - 08/29/19 1347    Visit Number  4    Number of Visits  13    Date for PT Re-Evaluation  09/29/19    Authorization Type  4    Authorization Time Period  10    PT Start Time  1347    PT Stop Time  1428    PT Time Calculation (min)  41 min    Activity Tolerance  Patient tolerated treatment well    Behavior During Therapy  [facility] for tasks assessed/performed       Past Medical History:  Diagnosis Date  . Atrial fibrillation ([place])   . Breast cancer ([place]) 1992   positive, radiation  . CHF (congestive heart failure) ([place])   . COPD (chronic obstructive pulmonary disease) ([place])   . Dysrhythmia   . Gout   . History of aortic valve replacement   . Hyperlipidemia   . Hypertension     Past Surgical History:  Procedure Laterality Date  . ABDOMINAL HYSTERECTOMY  1996  . arm surgery  2019  . BREAST BIOPSY Right 1998   neg  . BREAST BIOPSY Right 2007   neg  . BREAST BIOPSY Right 1992   positive  . BREAST EXCISIONAL BIOPSY Left 2000   neg  . CARDIAC VALVE REPLACEMENT  2014  . CATARACT EXTRACTION W/PHACO Left 03/30/2018   Procedure: CATARACT EXTRACTION PHACO AND INTRAOCULAR LENS PLACEMENT (IOC);  Surgeon: Birder Robson, MD;  Location: [facility] ORS;  Service: Ophthalmology;  Laterality: Left;  [place] 00:31.9 AP% 12.1 CDE 3.87 Fluid Pack lot # [id number]  . FRACTURE SURGERY Left    ARM/ LEG  . LEG SURGERY      There were no vitals filed for this visit.  Subjective Assessment - 08/29/19 1350    Subjective  R hip is good. No pain currently. Was a little sore after last session. L knee has not been bothering her since she stopped  taking statins. R hip just bothers her now.    Pertinent History  L knee OA, R hip bursitis. Pt states she broke her L knee about 2 years ago. Also Had R hip pain which is most likely due to compensation. Has a hard time standing standing up from a chair and couch. Pt was sweeping her porch about 2 years ago and fell, fracturing her L knee and arm and wrist.  Pt took atorvastatin which caused muscle pain. Stopped taking the medication and muscles feel better. R hip pain has been there for years. Had PT for it which helped. Also sleeps on her R side as well. B knees feel better after stopping the atorvastatin. R hip bothers her more now instead.    Patient Stated Goals  Get up from a chair more comfortably and more easily.    Currently in Pain?  No/denies    Pain Score  0-No pain    Pain Onset  More than a month ago                               PT Education - 08/29/19 1351    Education  provided  Yes    Education Details  ther-ex    Person(s) Educated  Patient    Methods  Explanation;Demonstration;Tactile cues;Verbal cues    Comprehension  Returned demonstration;Verbalized understanding      Objectives  Therapeutic exercise  Seated R hip ER 10x5 seconds for 2 sets   Seated R hip extension isometrics 10x10 seconds for 2 sets  Seated hip adduction ball and glute max squeeze 10x5 seconds for 2 sets   Sit <> stand with emphasis on R glute max use with B UE assist PRN   5x2  Seated hip adduction ball and glute max squeeze 10x5 seconds for 2 sets    S/L R hip abduction isometrics, with PT maintaining neutral R LE position 10x5 seconds for 3 sets  Prone R hip extension 10x, then 5x3  S/L R hip abduction concentric contraction with PT assist 10x3  Side stepping 32 ft to the R and 32 ft to the L (LE adduction not going past neutral) for 2 sets. Good glute med muscle use felt on R side  Forward wedding march 32 ft x2 to promote concentric contraction of  glute med muscle, pain free.     Improved exercise technique, movement at target joints, use of target muscles after mod verbal, visual, tactile cues.    Response to treatment Pt tolerated session well without aggravtion of symptoms   Clinical impression No complain of R hip pain throughout session. Continued working on isometric and concentric pain free glute med and max contraction to promote proper stress to healing tissues while promoting strength. Pt will benefit from continued skilled physical therapy services to decrease pain, improve strength and function.      PT Short Term Goals - 08/18/19 1756      PT SHORT TERM GOAL #1   Title  Patient will be independent with her HEP to decrease pain, improve strength and ability to perform sit <> stand transfers.    Time  3    Period  Weeks    Status  New    Target Date  09/08/19        PT Long Term Goals - 08/18/19 1757      PT LONG TERM GOAL #1   Title  Patient will have a decrease in R lateral hip pain to 2/10 or less at worst to promote ability to get up from a low surface more comfortably.    Baseline  8/10 R lateral hip pain at worst for the past 3 months (08/18/2019)    Time  6    Period  Weeks    Status  New    Target Date  09/29/19      PT LONG TERM GOAL #2   Title  Patient will have a decrease in L knee pain to 4/10 or less at worst to promote ability to stand up from sitting on a low surface with less difficulty.    Baseline  10/10 L knee pain at worst for the past 3 months (08/18/2019)    Time  6    Period  Weeks    Status  New    Target Date  09/29/19      PT LONG TERM GOAL #3   Title  Patient will improve bilateral hip extension and hip abduction strength by at least 1/2 MMT grade to promote ability to perform transfers with less difficulty.    Time  6    Period  Weeks    Status  New  Target Date  09/29/19      PT LONG TERM GOAL #4   Title  Pt will report minimal to no difficulty standing up  after sitting on the couch for about 20 minutes as a demonstration of improved ability to perform transfers.    Baseline  Increased R lateral hip pain with standing up from a couch after prolonged sitting (08/18/2019)    Time  6    Period  Weeks    Status  New    Target Date  09/29/19            Plan - 08/29/19 1352    Clinical Impression Statement  No complain of R hip pain throughout session. Continued working on isometric and concentric pain free glute med and max contraction to promote proper stress to healing tissues while promoting strength. Pt will benefit from continued skilled physical therapy services to decrease pain, improve strength and function.    Personal Factors and Comorbidities  Age;Comorbidity 3+    Comorbidities  CHF, HTN, COPD, hx of aortic valve replacement, hx of breast CA    Examination-Activity Limitations  Transfers    Stability/Clinical Decision Making  Stable/Uncomplicated    Rehab Potential  Good    Clinical Impairments Affecting Rehab Potential  (+) motivated, good response to previous therapies, (-) age, chronicity of condition    PT Frequency  2x / week    PT Duration  6 weeks    PT Treatment/Interventions  Iontophoresis 4mg /ml Dexamethasone;Patient/family education;Electrical Stimulation;Neuromuscular re-education;Manual techniques;Therapeutic exercise;Gait training;Therapeutic activities;Balance training;Aquatic Therapy;Functional mobility training;Dry needling    PT Next Visit Plan  isometric R glute med and max strengthening, B hip strengthening, lumbopelvic and femoral control, manual techniques, modalities PRN    Consulted and Agree with Plan of Care  Patient       Patient will benefit from skilled therapeutic intervention in order to improve the following deficits and impairments:  Pain, Improper body mechanics, Postural dysfunction, Decreased strength  Visit Diagnosis: Pain in right hip  Chronic pain of left knee  Muscle weakness  (generalized)     Problem List Patient Active Problem List   Diagnosis Date Noted  . On Coumadin for atrial fibrillation ([place]) 06/30/2018  . Chronic anticoagulation 08/27/2017  . History of right breast cancer 08/27/2017  . Chronic obstructive pulmonary disease ([employer]) 07/04/2015  . Controlled gout 06/06/2015  . Anxiety 02/09/2015  . Synovial cyst of popliteal space 02/09/2015  . Benign hypertension 02/09/2015  . Blood type A+ 02/09/2015  . Arterial branch occlusion of retina 02/09/2015  . Chronic constipation 02/09/2015  . Insomnia, persistent 02/09/2015  . Chronic combined systolic and diastolic CHF, NYHA class 1 ([place]) 02/09/2015  . Dyslipidemia 02/09/2015  . Atrial fibrillation ([place]) 02/09/2015  . Arthritis urica 02/09/2015  . Hearing loss 02/09/2015  . History of open heart surgery 02/09/2015  . Chronic hoarseness 02/09/2015  . Cardiomegaly 02/09/2015  . Dysmetabolic syndrome 0000000  . Migraine with aura and without status migrainosus, not intractable 02/09/2015  . Adult BMI 30+ 02/09/2015  . Hypertensive pulmonary vascular disease ([place]) 02/09/2015  . Allergic rhinitis, seasonal 02/09/2015  . Vitamin D deficiency 02/09/2015  . History of mitral valve repair 10/01/2012  . History of aortic valve replacement 10/01/2012  . Congestive heart failure ([place]) 10/01/2012    Joneen Boers PT, DPT   08/29/2019, 2:42 PM  [facility] [address], [place], [address] Phone: [phone]   Fax:  [phone]  Name: MISA WEESE MRN: [id number] Date of Birth: 04-14-1941

## 2019-08-31 ENCOUNTER — Ambulatory Visit: Payer: Medicare Other

## 2019-08-31 ENCOUNTER — Ambulatory Visit
Admission: RE | Admit: 2019-08-31 | Discharge: 2019-08-31 | Disposition: A | Discharge status: Home / Self Care | Payer: Medicare Other | Source: Ambulatory Visit | Attending: Family Medicine | Admitting: Family Medicine

## 2019-08-31 ENCOUNTER — Other Ambulatory Visit: Payer: Self-pay

## 2019-08-31 DIAGNOSIS — M6281 Muscle weakness (generalized): Secondary | ICD-10-CM

## 2019-08-31 DIAGNOSIS — G8929 Other chronic pain: Secondary | ICD-10-CM

## 2019-08-31 DIAGNOSIS — M25551 Pain in right hip: Secondary | ICD-10-CM | POA: Diagnosis not present

## 2019-08-31 DIAGNOSIS — M25562 Pain in left knee: Secondary | ICD-10-CM | POA: Diagnosis not present

## 2019-08-31 DIAGNOSIS — Z1231 Encounter for screening mammogram for malignant neoplasm of breast: Secondary | ICD-10-CM | POA: Diagnosis not present

## 2019-08-31 NOTE — Therapy (Signed)
[facility] [address] [place], [address] Phone: [phone]   Fax:  [phone]  Physical Therapy Treatment  Patient Details  Name: [name] MRN: [id number] Date of Birth: 1941-01-23 Referring Provider (PT): [name], MD   Encounter Date: 08/31/2019  PT End of Session - 08/31/19 1406    Visit Number  5    Number of Visits  13    Date for PT Re-Evaluation  09/29/19    Authorization Type  5    Authorization Time Period  10    PT Start Time  1406    PT Stop Time  1447    PT Time Calculation (min)  41 min    Activity Tolerance  Patient tolerated treatment well    Behavior During Therapy  [facility] for tasks assessed/performed       Past Medical History:  Diagnosis Date  . Atrial fibrillation ([place])   . Breast cancer ([place]) 1992   positive, radiation  . CHF (congestive heart failure) ([place])   . COPD (chronic obstructive pulmonary disease) ([place])   . Dysrhythmia   . Gout   . History of aortic valve replacement   . Hyperlipidemia   . Hypertension     Past Surgical History:  Procedure Laterality Date  . ABDOMINAL HYSTERECTOMY  1996  . arm surgery  2019  . BREAST BIOPSY Right 1998   neg  . BREAST BIOPSY Right 2007   neg  . BREAST BIOPSY Right 1992   positive  . BREAST EXCISIONAL BIOPSY Left 2000   neg  . BREAST LUMPECTOMY Right 1992  . CARDIAC VALVE REPLACEMENT  2014  . CATARACT EXTRACTION W/PHACO Left 03/30/2018   Procedure: CATARACT EXTRACTION PHACO AND INTRAOCULAR LENS PLACEMENT (IOC);  Surgeon: Birder Robson, MD;  Location: [facility] ORS;  Service: Ophthalmology;  Laterality: Left;  [place] 00:31.9 AP% 12.1 CDE 3.87 Fluid Pack lot # [id number]  . FRACTURE SURGERY Left    ARM/ LEG  . LEG SURGERY      There were no vitals filed for this visit.  Subjective Assessment - 08/31/19 1409    Subjective  R hip is good. No pain in R hip currently. L knee was stiff when she got out of the car  but got better when she moved.  3/10 R hip pain at most for the past 5 days.    Pertinent History  L knee OA, R hip bursitis. Pt states she broke her L knee about 2 years ago. Also Had R hip pain which is most likely due to compensation. Has a hard time standing standing up from a chair and couch. Pt was sweeping her porch about 2 years ago and fell, fracturing her L knee and arm and wrist.  Pt took atorvastatin which caused muscle pain. Stopped taking the medication and muscles feel better. R hip pain has been there for years. Had PT for it which helped. Also sleeps on her R side as well. B knees feel better after stopping the atorvastatin. R hip bothers her more now instead.    Patient Stated Goals  Get up from a chair more comfortably and more easily.    Currently in Pain?  No/denies    Pain Score  0-No pain    Pain Onset  More than a month ago  PT Education - 08/31/19 1411    Education provided  Yes    Education Details  ther-ex    [employer]) Educated  Patient    Methods  Explanation;Demonstration;Tactile cues;Verbal cues    Comprehension  Returned demonstration;Verbalized understanding       Objectives  Therapeutic exercise   Seated R hip extension isometrics 10x10seconds for 2sets  Seated R hip ER 10x5 secondsfor 2 sets   Seated hip adduction ball and glute max squeeze 10x5 seconds for 2 sets    S/L R hip abduction isometrics, with PT maintaining neutral R LE position 10x5 seconds for 3 sets   S/L R hip abduction concentric contraction with PT assist 10x3  Prone R hip extension 10x2 to promote R glute max muscle strengthening   Sit <> stand with emphasis on R glute max use with B UE assist PRN              5x  Forward wedding march 30 ft x4 to promote concentric contraction of glute med muscle, pain free.   Side stepping 30 ft to the R and 30 ft to the L (LE adduction not going past neutral) for 2 sets.  Good glute med muscle use felt on R side   SLS R LE with B UE assist 10x5 second for isometric closed chain R glute med muscle contraction    Improved exercise technique, movement at target joints, use of target muscles after mod verbal, visual, tactile cues.    Response to treatment Pt tolerated session well without aggravtion of symptoms   Clinical impression Pt demonstrates decreasing R lateral hip pain levels based on subjective reports. Continued working on isometric and gentle concentric glute med and max contraction to promote proper stress to healing tendons. Pt tolerated session well without aggravation of symptoms. Pt will benefit from continued skilled physical therapy services to decrease pain, improve strength and function.     PT Short Term Goals - 08/18/19 1756      PT SHORT TERM GOAL #1   Title  Patient will be independent with her HEP to decrease pain, improve strength and ability to perform sit <> stand transfers.    Time  3    Period  Weeks    Status  New    Target Date  09/08/19        PT Long Term Goals - 08/18/19 1757      PT LONG TERM GOAL #1   Title  Patient will have a decrease in R lateral hip pain to 2/10 or less at worst to promote ability to get up from a low surface more comfortably.    Baseline  8/10 R lateral hip pain at worst for the past 3 months (08/18/2019)    Time  6    Period  Weeks    Status  New    Target Date  09/29/19      PT LONG TERM GOAL #2   Title  Patient will have a decrease in L knee pain to 4/10 or less at worst to promote ability to stand up from sitting on a low surface with less difficulty.    Baseline  10/10 L knee pain at worst for the past 3 months (08/18/2019)    Time  6    Period  Weeks    Status  New    Target Date  09/29/19      PT LONG TERM GOAL #3   Title  Patient will improve bilateral hip  extension and hip abduction strength by at least 1/2 MMT grade to promote ability to perform transfers  with less difficulty.    Time  6    Period  Weeks    Status  New    Target Date  09/29/19      PT LONG TERM GOAL #4   Title  Pt will report minimal to no difficulty standing up after sitting on the couch for about 20 minutes as a demonstration of improved ability to perform transfers.    Baseline  Increased R lateral hip pain with standing up from a couch after prolonged sitting (08/18/2019)    Time  6    Period  Weeks    Status  New    Target Date  09/29/19            Plan - 08/31/19 1412    Clinical Impression Statement  Pt demonstrates decreasing R lateral hip pain levels based on subjective reports. Continued working on isometric and gentle concentric glute med and max contraction to promote proper stress to healing tendons. Pt tolerated session well without aggravation of symptoms. Pt will benefit from continued skilled physical therapy services to decrease pain, improve strength and function.    Personal Factors and Comorbidities  Age;Comorbidity 3+    Comorbidities  CHF, HTN, COPD, hx of aortic valve replacement, hx of breast CA    Examination-Activity Limitations  Transfers    Stability/Clinical Decision Making  Stable/Uncomplicated    Rehab Potential  Good    Clinical Impairments Affecting Rehab Potential  (+) motivated, good response to previous therapies, (-) age, chronicity of condition    PT Frequency  2x / week    PT Duration  6 weeks    PT Treatment/Interventions  Iontophoresis 4mg /ml Dexamethasone;Patient/family education;Electrical Stimulation;Neuromuscular re-education;Manual techniques;Therapeutic exercise;Gait training;Therapeutic activities;Balance training;Aquatic Therapy;Functional mobility training;Dry needling    PT Next Visit Plan  isometric R glute med and max strengthening, B hip strengthening, lumbopelvic and femoral control, manual techniques, modalities PRN    Consulted and Agree with Plan of Care  Patient       Patient will benefit from skilled  therapeutic intervention in order to improve the following deficits and impairments:  Pain, Improper body mechanics, Postural dysfunction, Decreased strength  Visit Diagnosis: Pain in right hip  Chronic pain of left knee  Muscle weakness (generalized)     Problem List Patient Active Problem List   Diagnosis Date Noted  . On Coumadin for atrial fibrillation ([place]) 06/30/2018  . Chronic anticoagulation 08/27/2017  . History of right breast cancer 08/27/2017  . Chronic obstructive pulmonary disease ([place]) 07/04/2015  . Controlled gout 06/06/2015  . Anxiety 02/09/2015  . Synovial cyst of popliteal space 02/09/2015  . Benign hypertension 02/09/2015  . Blood type A+ 02/09/2015  . Arterial branch occlusion of retina 02/09/2015  . Chronic constipation 02/09/2015  . Insomnia, persistent 02/09/2015  . Chronic combined systolic and diastolic CHF, NYHA class 1 ([place]) 02/09/2015  . Dyslipidemia 02/09/2015  . Atrial fibrillation ([place]) 02/09/2015  . Arthritis urica 02/09/2015  . Hearing loss 02/09/2015  . History of open heart surgery 02/09/2015  . Chronic hoarseness 02/09/2015  . Cardiomegaly 02/09/2015  . Dysmetabolic syndrome 0000000  . Migraine with aura and without status migrainosus, not intractable 02/09/2015  . Adult BMI 30+ 02/09/2015  . Hypertensive pulmonary vascular disease ([place]) 02/09/2015  . Allergic rhinitis, seasonal 02/09/2015  . Vitamin D deficiency 02/09/2015  . History of mitral valve repair 10/01/2012  . History of  aortic valve replacement 10/01/2012  . Congestive heart failure ([place]) 10/01/2012    Joneen Boers PT, DPT   08/31/2019, 3:01 PM  [facility] [address] [place], [address] Phone: [phone]   Fax:  [phone]  Name: [name] MRN: [id number] Date of Birth: Mar 29, 1941

## 2019-09-05 ENCOUNTER — Other Ambulatory Visit: Payer: Self-pay

## 2019-09-05 ENCOUNTER — Ambulatory Visit: Payer: Medicare Other

## 2019-09-05 DIAGNOSIS — M25562 Pain in left knee: Secondary | ICD-10-CM | POA: Diagnosis not present

## 2019-09-05 DIAGNOSIS — G8929 Other chronic pain: Secondary | ICD-10-CM | POA: Diagnosis not present

## 2019-09-05 DIAGNOSIS — M6281 Muscle weakness (generalized): Secondary | ICD-10-CM | POA: Diagnosis not present

## 2019-09-05 DIAGNOSIS — M25551 Pain in right hip: Secondary | ICD-10-CM

## 2019-09-05 NOTE — Therapy (Signed)
[facility] [address], [place], [address] Phone: [phone]   Fax:  [phone]  Physical Therapy Treatment  Patient Details  Name: [name] MRN: [id number] Date of Birth: 02/12/1941 Referring Provider (PT): [name], MD   Encounter Date: 09/05/2019  PT End of Session - 09/05/19 0950    Visit Number  6    Number of Visits  13    Date for PT Re-Evaluation  09/29/19    Authorization Type  6    Authorization Time Period  10    PT Start Time  0950    PT Stop Time  1030    PT Time Calculation (min)  40 min    Activity Tolerance  Patient tolerated treatment well    Behavior During Therapy  [facility] for tasks assessed/performed       Past Medical History:  Diagnosis Date  . Atrial fibrillation ([place])   . Breast cancer ([place]) 1992   positive, radiation  . CHF (congestive heart failure) ([place])   . COPD (chronic obstructive pulmonary disease) ([place])   . Dysrhythmia   . Gout   . History of aortic valve replacement   . Hyperlipidemia   . Hypertension     Past Surgical History:  Procedure Laterality Date  . ABDOMINAL HYSTERECTOMY  1996  . arm surgery  2019  . BREAST BIOPSY Right 1998   neg  . BREAST BIOPSY Right 2007   neg  . BREAST BIOPSY Right 1992   positive  . BREAST EXCISIONAL BIOPSY Left 2000   neg  . BREAST LUMPECTOMY Right 1992  . CARDIAC VALVE REPLACEMENT  2014  . CATARACT EXTRACTION W/PHACO Left 03/30/2018   Procedure: CATARACT EXTRACTION PHACO AND INTRAOCULAR LENS PLACEMENT (IOC);  Surgeon: Birder Robson, MD;  Location: [facility] ORS;  Service: Ophthalmology;  Laterality: Left;  [place] 00:31.9 AP% 12.1 CDE 3.87 Fluid Pack lot # [id number]  . FRACTURE SURGERY Left    ARM/ LEG  . LEG SURGERY      There were no vitals filed for this visit.  Subjective Assessment - 09/05/19 0951    Subjective  R hip is a little stiff this morning. L knee is doing great, just stiff. No pain anywhere,  just stiff.    Pertinent History  L knee OA, R hip bursitis. Pt states she broke her L knee about 2 years ago. Also Had R hip pain which is most likely due to compensation. Has a hard time standing standing up from a chair and couch. Pt was sweeping her porch about 2 years ago and fell, fracturing her L knee and arm and wrist.  Pt took atorvastatin which caused muscle pain. Stopped taking the medication and muscles feel better. R hip pain has been there for years. Had PT for it which helped. Also sleeps on her R side as well. B knees feel better after stopping the atorvastatin. R hip bothers her more now instead.    Patient Stated Goals  Get up from a chair more comfortably and more easily.    Currently in Pain?  No/denies    Pain Score  0-No pain    Pain Onset  More than a month ago                               PT Education - 09/05/19 0955    Education provided  Yes  Education Details  ther-ex, HEP    Person(s) Educated  Patient    Methods  Explanation;Demonstration;Tactile cues;Verbal cues    Comprehension  Returned demonstration;Verbalized understanding      Objectives  Therapeutic exercise   Seated R hip ER 10x5 secondsfor 2 sets  Seated R hip extension isometrics 10x10seconds for 2sets  Seated hip adduction ball and glute max squeeze 10x5 seconds for 3 sets   S/L R hip abduction isometrics, with PT maintaining neutral R LE position 10x5 seconds for 3 sets  S/L R hip abduction concentric contraction with PT assist 10x3  Prone hip extension to promote glute max muscle strengthening   R 10x3  L 10x3   Side stepping 32 ft to the R and 32 ft to the L (LE adduction not going past neutral) for 2 sets. Good glute med muscle use felt on R side   Forward wedding march 30 ft x4 to promote concentric contraction of glute med muscle, pain free.    SLS with B UE to 2 finger assist for isometric closed chain glute med muscle  contraction  R 10x5 seconds   L 10x5 seconds     Improved exercise technique, movement at target joints, use of target muscles after mod verbal, visual, tactile cues.    Response to treatment Pt tolerated session well without aggravtion of symptoms. Good muscle use felt with exercises.    Clinical impression  Continued working on isometric and concentric R glute med and max muscle contraction, pain free level of effort to continue promoting proper stress to glute tendons to promote proper healing. Pt tolerated session well without aggravation of symptoms. Pt will benefit from continued skilled physical therapy services to decrease pain, improve strength and function.    PT Short Term Goals - 08/18/19 1756      PT SHORT TERM GOAL #1   Title  Patient will be independent with her HEP to decrease pain, improve strength and ability to perform sit <> stand transfers.    Time  3    Period  Weeks    Status  New    Target Date  09/08/19        PT Long Term Goals - 08/18/19 1757      PT LONG TERM GOAL #1   Title  Patient will have a decrease in R lateral hip pain to 2/10 or less at worst to promote ability to get up from a low surface more comfortably.    Baseline  8/10 R lateral hip pain at worst for the past 3 months (08/18/2019)    Time  6    Period  Weeks    Status  New    Target Date  09/29/19      PT LONG TERM GOAL #2   Title  Patient will have a decrease in L knee pain to 4/10 or less at worst to promote ability to stand up from sitting on a low surface with less difficulty.    Baseline  10/10 L knee pain at worst for the past 3 months (08/18/2019)    Time  6    Period  Weeks    Status  New    Target Date  09/29/19      PT LONG TERM GOAL #3   Title  Patient will improve bilateral hip extension and hip abduction strength by at least 1/2 MMT grade to promote ability to perform transfers with less difficulty.    Time  6  Period  Weeks    Status  New     Target Date  09/29/19      PT LONG TERM GOAL #4   Title  Pt will report minimal to no difficulty standing up after sitting on the couch for about 20 minutes as a demonstration of improved ability to perform transfers.    Baseline  Increased R lateral hip pain with standing up from a couch after prolonged sitting (08/18/2019)    Time  6    Period  Weeks    Status  New    Target Date  09/29/19            Plan - 09/05/19 0956    Clinical Impression Statement  Continued working on isometric and concentric R glute med and max muscle contraction, pain free level of effort to continue promoting proper stress to glute tendons to promote proper healing. Pt tolerated session well without aggravation of symptoms. Pt will benefit from continued skilled physical therapy services to decrease pain, improve strength and function.    Personal Factors and Comorbidities  Age;Comorbidity 3+    Comorbidities  CHF, HTN, COPD, hx of aortic valve replacement, hx of breast CA    Examination-Activity Limitations  Transfers    Stability/Clinical Decision Making  Stable/Uncomplicated    Rehab Potential  Good    Clinical Impairments Affecting Rehab Potential  (+) motivated, good response to previous therapies, (-) age, chronicity of condition    PT Frequency  2x / week    PT Duration  6 weeks    PT Treatment/Interventions  Iontophoresis 4mg /ml Dexamethasone;Patient/family education;Electrical Stimulation;Neuromuscular re-education;Manual techniques;Therapeutic exercise;Gait training;Therapeutic activities;Balance training;Aquatic Therapy;Functional mobility training;Dry needling    PT Next Visit Plan  isometric R glute med and max strengthening, B hip strengthening, lumbopelvic and femoral control, manual techniques, modalities PRN    Consulted and Agree with Plan of Care  Patient       Patient will benefit from skilled therapeutic intervention in order to improve the following deficits and impairments:  Pain,  Improper body mechanics, Postural dysfunction, Decreased strength  Visit Diagnosis: Pain in right hip  Chronic pain of left knee  Muscle weakness (generalized)     Problem List Patient Active Problem List   Diagnosis Date Noted  . On Coumadin for atrial fibrillation ([place]) 06/30/2018  . Chronic anticoagulation 08/27/2017  . History of right breast cancer 08/27/2017  . Chronic obstructive pulmonary disease ([place]) 07/04/2015  . Controlled gout 06/06/2015  . Anxiety 02/09/2015  . Synovial cyst of popliteal space 02/09/2015  . Benign hypertension 02/09/2015  . Blood type A+ 02/09/2015  . Arterial branch occlusion of retina 02/09/2015  . Chronic constipation 02/09/2015  . Insomnia, persistent 02/09/2015  . Chronic combined systolic and diastolic CHF, NYHA class 1 ([place]) 02/09/2015  . Dyslipidemia 02/09/2015  . Atrial fibrillation ([place]) 02/09/2015  . Arthritis urica 02/09/2015  . Hearing loss 02/09/2015  . History of open heart surgery 02/09/2015  . Chronic hoarseness 02/09/2015  . Cardiomegaly 02/09/2015  . Dysmetabolic syndrome 0000000  . Migraine with aura and without status migrainosus, not intractable 02/09/2015  . Adult BMI 30+ 02/09/2015  . Hypertensive pulmonary vascular disease ([place]) 02/09/2015  . Allergic rhinitis, seasonal 02/09/2015  . Vitamin D deficiency 02/09/2015  . History of mitral valve repair 10/01/2012  . History of aortic valve replacement 10/01/2012  . Congestive heart failure ([place]) 10/01/2012     Joneen Boers PT, DPT  09/05/2019, 5:09 PM  [facility] [address], [place], [address] Phone: [phone]   Fax:  [phone]  Name: [name] MRN: [id number] Date of Birth: 1941-07-23

## 2019-09-05 NOTE — Patient Instructions (Signed)
Forward wedding march given as part of her HEP. Pt demonstrated and verbalized understanding.

## 2019-09-07 ENCOUNTER — Ambulatory Visit: Payer: Medicare Other

## 2019-09-07 ENCOUNTER — Other Ambulatory Visit: Payer: Self-pay

## 2019-09-07 DIAGNOSIS — M25551 Pain in right hip: Secondary | ICD-10-CM | POA: Diagnosis not present

## 2019-09-07 DIAGNOSIS — G8929 Other chronic pain: Secondary | ICD-10-CM

## 2019-09-07 DIAGNOSIS — M25562 Pain in left knee: Secondary | ICD-10-CM

## 2019-09-07 DIAGNOSIS — M6281 Muscle weakness (generalized): Secondary | ICD-10-CM | POA: Diagnosis not present

## 2019-09-07 NOTE — Therapy (Signed)
[facility] [address], [place], [address] Phone: [phone]   Fax:  [phone]  Physical Therapy Treatment  Patient Details  Name: [name] MRN: [id number] Date of Birth: 01-02-1941 Referring Provider (PT): [name], MD   Encounter Date: 09/07/2019  PT End of Session - 09/07/19 1122    Visit Number  7    Number of Visits  13    Date for PT Re-Evaluation  09/29/19    Authorization Type  7    Authorization Time Period  10    PT Start Time  1122    PT Stop Time  1204    PT Time Calculation (min)  42 min    Activity Tolerance  Patient tolerated treatment well    Behavior During Therapy  [facility] for tasks assessed/performed       Past Medical History:  Diagnosis Date  . Atrial fibrillation ([place])   . Breast cancer ([place]) 1992   positive, radiation  . CHF (congestive heart failure) ([place])   . COPD (chronic obstructive pulmonary disease) ([place])   . Dysrhythmia   . Gout   . History of aortic valve replacement   . Hyperlipidemia   . Hypertension     Past Surgical History:  Procedure Laterality Date  . ABDOMINAL HYSTERECTOMY  1996  . arm surgery  2019  . BREAST BIOPSY Right 1998   neg  . BREAST BIOPSY Right 2007   neg  . BREAST BIOPSY Right 1992   positive  . BREAST EXCISIONAL BIOPSY Left 2000   neg  . BREAST LUMPECTOMY Right 1992  . CARDIAC VALVE REPLACEMENT  2014  . CATARACT EXTRACTION W/PHACO Left 03/30/2018   Procedure: CATARACT EXTRACTION PHACO AND INTRAOCULAR LENS PLACEMENT (IOC);  Surgeon: Birder Robson, MD;  Location: [facility] ORS;  Service: Ophthalmology;  Laterality: Left;  [place] 00:31.9 AP% 12.1 CDE 3.87 Fluid Pack lot # [id number]  . FRACTURE SURGERY Left    ARM/ LEG  . LEG SURGERY      There were no vitals filed for this visit.  Subjective Assessment - 09/07/19 1124    Subjective  R hip is a little stiff this morning in which the cold might have something to do with it.  L hip is stiff too. Walking is better. Better able to stand up from a couch after sitting for a while.  0/10 L knee pain at most for the past 7 days.  No R hip pain currently.    Pertinent History  L knee OA, R hip bursitis. Pt states she broke her L knee about 2 years ago. Also Had R hip pain which is most likely due to compensation. Has a hard time standing standing up from a chair and couch. Pt was sweeping her porch about 2 years ago and fell, fracturing her L knee and arm and wrist.  Pt took atorvastatin which caused muscle pain. Stopped taking the medication and muscles feel better. R hip pain has been there for years. Had PT for it which helped. Also sleeps on her R side as well. B knees feel better after stopping the atorvastatin. R hip bothers her more now instead.    Patient Stated Goals  Get up from a chair more comfortably and more easily.    Currently in Pain?  No/denies    Pain Score  0-No pain    Pain Onset  More than a month ago  PT Education - 09/07/19 1127    Education provided  Yes    Education Details  ther-ex    [employer]) Educated  Patient    Methods  Explanation;Demonstration;Tactile cues;Verbal cues    Comprehension  Returned demonstration;Verbalized understanding      Objectives  Therapeutic exercise   Seated R hip extension isometrics 10x10seconds   Seated R hip ER 10x10 seconds  Seated hip adduction ball and glute max squeeze 10x10 seconds   Sit <> stand with emphasis on R LE use 5x  Standing hip abduction with B UE assist   R 10x3  L 10x3 (for closed chain glute strengthening)  Felt good per pt   SLS with B UE to 2 finger assist for isometric closed chain glute med muscle contraction            R 10x5 seconds for 2 sets             L 10x5 seconds for 2 sets  Forward step up onto 1st regular step with L UE assist 10x with emphasis on femoral control   Prone hip extension to promote glute  max muscle strengthening            R 10x3             L 10x3    Side stepping 32 ft to the R and 19ft to the L (LE adduction not going past neutral) for 2 sets. Good glute med muscle use felt on R side   Forward wedding march 52ft x4to promote concentric contraction of glute med muscle, pain free.      Improved exercise technique, movement at target joints, use of target muscles after mod verbal, visual, tactile cues.    Response to treatment Pt tolerated session well without aggravtion of symptoms. Good muscle use felt with exercises.    Clinical impression Pt improving R hip and L knee pain and function (ability to stand up from sitting more comfortably and with less difficulty) based on subjective reports. Continued working on isometric and concentric R glute med contraction to continue promoting proper stress to distal tendon to promote proper healing. Pt tolerated session well without aggravation of symptoms. Pt will benefit from continued skilled physical therapy services to decrease pain, improve strength, femoral control, and ability to perform functional tasks.    PT Short Term Goals - 08/18/19 1756      PT SHORT TERM GOAL #1   Title  Patient will be independent with her HEP to decrease pain, improve strength and ability to perform sit <> stand transfers.    Time  3    Period  Weeks    Status  New    Target Date  09/08/19        PT Long Term Goals - 08/18/19 1757      PT LONG TERM GOAL #1   Title  Patient will have a decrease in R lateral hip pain to 2/10 or less at worst to promote ability to get up from a low surface more comfortably.    Baseline  8/10 R lateral hip pain at worst for the past 3 months (08/18/2019)    Time  6    Period  Weeks    Status  New    Target Date  09/29/19      PT LONG TERM GOAL #2   Title  Patient will have a decrease in L knee pain to 4/10 or less at worst to promote ability to  stand up from sitting on a  low surface with less difficulty.    Baseline  10/10 L knee pain at worst for the past 3 months (08/18/2019)    Time  6    Period  Weeks    Status  New    Target Date  09/29/19      PT LONG TERM GOAL #3   Title  Patient will improve bilateral hip extension and hip abduction strength by at least 1/2 MMT grade to promote ability to perform transfers with less difficulty.    Time  6    Period  Weeks    Status  New    Target Date  09/29/19      PT LONG TERM GOAL #4   Title  Pt will report minimal to no difficulty standing up after sitting on the couch for about 20 minutes as a demonstration of improved ability to perform transfers.    Baseline  Increased R lateral hip pain with standing up from a couch after prolonged sitting (08/18/2019)    Time  6    Period  Weeks    Status  New    Target Date  09/29/19            Plan - 09/07/19 1129    Clinical Impression Statement  Pt improving R hip and L knee pain and function (ability to stand up from sitting more comfortably and with less difficulty) based on subjective reports. Continued working on isometric and concentric R glute med contraction to continue promoting proper stress to distal tendon to promote proper healing. Pt tolerated session well without aggravation of symptoms. Pt will benefit from continued skilled physical therapy services to decrease pain, improve strength, femoral control, and ability to perform functional tasks.    Personal Factors and Comorbidities  Age;Comorbidity 3+    Comorbidities  CHF, HTN, COPD, hx of aortic valve replacement, hx of breast CA    Examination-Activity Limitations  Transfers    Stability/Clinical Decision Making  Stable/Uncomplicated    Rehab Potential  Good    Clinical Impairments Affecting Rehab Potential  (+) motivated, good response to previous therapies, (-) age, chronicity of condition    PT Frequency  2x / week    PT Duration  6 weeks    PT Treatment/Interventions  Iontophoresis  4mg /ml Dexamethasone;Patient/family education;Electrical Stimulation;Neuromuscular re-education;Manual techniques;Therapeutic exercise;Gait training;Therapeutic activities;Balance training;Aquatic Therapy;Functional mobility training;Dry needling    PT Next Visit Plan  isometric R glute med and max strengthening, B hip strengthening, lumbopelvic and femoral control, manual techniques, modalities PRN    Consulted and Agree with Plan of Care  Patient       Patient will benefit from skilled therapeutic intervention in order to improve the following deficits and impairments:  Pain, Improper body mechanics, Postural dysfunction, Decreased strength  Visit Diagnosis: Pain in right hip  Chronic pain of left knee  Muscle weakness (generalized)     Problem List Patient Active Problem List   Diagnosis Date Noted  . On Coumadin for atrial fibrillation ([place]) 06/30/2018  . Chronic anticoagulation 08/27/2017  . History of right breast cancer 08/27/2017  . Chronic obstructive pulmonary disease ([place]) 07/04/2015  . Controlled gout 06/06/2015  . Anxiety 02/09/2015  . Synovial cyst of popliteal space 02/09/2015  . Benign hypertension 02/09/2015  . Blood type A+ 02/09/2015  . Arterial branch occlusion of retina 02/09/2015  . Chronic constipation 02/09/2015  . Insomnia, persistent 02/09/2015  . Chronic combined systolic and diastolic CHF, NYHA class  1 ([place]) 02/09/2015  . Dyslipidemia 02/09/2015  . Atrial fibrillation ([place]) 02/09/2015  . Arthritis urica 02/09/2015  . Hearing loss 02/09/2015  . History of open heart surgery 02/09/2015  . Chronic hoarseness 02/09/2015  . Cardiomegaly 02/09/2015  . Dysmetabolic syndrome 0000000  . Migraine with aura and without status migrainosus, not intractable 02/09/2015  . Adult BMI 30+ 02/09/2015  . Hypertensive pulmonary vascular disease ([place]) 02/09/2015  . Allergic rhinitis, seasonal 02/09/2015  . Vitamin D deficiency 02/09/2015  . History of mitral  valve repair 10/01/2012  . History of aortic valve replacement 10/01/2012  . Congestive heart failure ([place]) 10/01/2012    Joneen Boers PT, DPT   09/07/2019, 12:07 PM  [facility] [address], [place], [address] Phone: [phone]   Fax:  [phone]  Name: [name] MRN: [id number] Date of Birth: 29-Nov-1940

## 2019-09-12 ENCOUNTER — Ambulatory Visit: Payer: Medicare Other | Attending: Family Medicine

## 2019-09-12 ENCOUNTER — Other Ambulatory Visit: Payer: Self-pay

## 2019-09-12 DIAGNOSIS — M6281 Muscle weakness (generalized): Secondary | ICD-10-CM

## 2019-09-12 DIAGNOSIS — M25552 Pain in left hip: Secondary | ICD-10-CM | POA: Diagnosis not present

## 2019-09-12 DIAGNOSIS — M25562 Pain in left knee: Secondary | ICD-10-CM | POA: Insufficient documentation

## 2019-09-12 DIAGNOSIS — I480 Paroxysmal atrial fibrillation: Secondary | ICD-10-CM | POA: Diagnosis not present

## 2019-09-12 DIAGNOSIS — R262 Difficulty in walking, not elsewhere classified: Secondary | ICD-10-CM | POA: Diagnosis not present

## 2019-09-12 DIAGNOSIS — M25551 Pain in right hip: Secondary | ICD-10-CM | POA: Diagnosis not present

## 2019-09-12 DIAGNOSIS — G8929 Other chronic pain: Secondary | ICD-10-CM

## 2019-09-12 NOTE — Therapy (Signed)
[facility] [address] [place], [address] Phone: [phone]   Fax:  [phone]  Physical Therapy Treatment  Patient Details  Name: [name] MRN: [id number] Date of Birth: 1941-02-23 Referring Provider (PT): [name], MD   Encounter Date: 09/12/2019    Past Medical History:  Diagnosis Date  . Atrial fibrillation ([place])   . Breast cancer ([place]) 1992   positive, radiation  . CHF (congestive heart failure) ([place])   . COPD (chronic obstructive pulmonary disease) ([place])   . Dysrhythmia   . Gout   . History of aortic valve replacement   . Hyperlipidemia   . Hypertension     Past Surgical History:  Procedure Laterality Date  . ABDOMINAL HYSTERECTOMY  1996  . arm surgery  2019  . BREAST BIOPSY Right 1998   neg  . BREAST BIOPSY Right 2007   neg  . BREAST BIOPSY Right 1992   positive  . BREAST EXCISIONAL BIOPSY Left 2000   neg  . BREAST LUMPECTOMY Right 1992  . CARDIAC VALVE REPLACEMENT  2014  . CATARACT EXTRACTION W/PHACO Left 03/30/2018   Procedure: CATARACT EXTRACTION PHACO AND INTRAOCULAR LENS PLACEMENT (IOC);  Surgeon: Birder Robson, MD;  Location: [facility] ORS;  Service: Ophthalmology;  Laterality: Left;  [place] 00:31.9 AP% 12.1 CDE 3.87 Fluid Pack lot # [id number]  . FRACTURE SURGERY Left    ARM/ LEG  . LEG SURGERY      There were no vitals filed for this visit.  Subjective Assessment - 09/12/19 1606    Subjective  Patient reported that she is tired today. No pain at start of session, has been very busy today.    Pertinent History  L knee OA, R hip bursitis. Pt states she broke her L knee about 2 years ago. Also Had R hip pain which is most likely due to compensation. Has a hard time standing standing up from a chair and couch. Pt was sweeping her porch about 2 years ago and fell, fracturing her L knee and arm and wrist.  Pt took atorvastatin which caused muscle pain. Stopped taking the  medication and muscles feel better. R hip pain has been there for years. Had PT for it which helped. Also sleeps on her R side as well. B knees feel better after stopping the atorvastatin. R hip bothers her more now instead.    Limitations  Lifting;Standing;Walking;House hold activities    How long can you sit comfortably?  unlimited    How long can you stand comfortably?  25min    How long can you walk comfortably?  30 min.    Patient Stated Goals  Get up from a chair more comfortably and more easily.    Currently in Pain?  No/denies        Objectives   Therapeutic exercise   Seated R hip extension isometrics 10x10 seconds     Seated R hip ER 10x10 seconds   Seated hip adduction ball and glute max squeeze 10x10 seconds    Sit <> stand with emphasis on R LE use 5x, with RLE behind L, x3, x5    Standing hip abduction, and then standing hip extension with B UE assist              R 10x3             L 10x3 (for closed chain glute strengthening)  SLS with B UE to 2 finger assist for isometric closed chain glute med muscle contraction             R 10x5 seconds for 2 sets             L 10x5 seconds for 2 sets     Improved exercise technique, movement at target joints, use of target muscles after mod verbal, visual, tactile cues.        Response to treatment  Pt tolerated session well without aggravtion of symptoms. Good muscle use felt with exercises. Patient reported fatigue with seated hip ER isometrics     Clinical impression Patient with excellent motivation during session. Exhibited good muscle activation throughout activities. Pt continued to work on isometric and concentric R glute med contraction. The patient would benefit from further skilled PT to continue to progress towards goals.                        PT Education - 09/12/19 1607    Education provided  Yes    Education Details  therex form/ technique    Person(s) Educated   Patient    Methods  Explanation;Demonstration;Tactile cues;Verbal cues    Comprehension  Verbalized understanding;Returned demonstration       PT Short Term Goals - 08/18/19 1756      PT SHORT TERM GOAL #1   Title  Patient will be independent with her HEP to decrease pain, improve strength and ability to perform sit <> stand transfers.    Time  3    Period  Weeks    Status  New    Target Date  09/08/19        PT Long Term Goals - 08/18/19 1757      PT LONG TERM GOAL #1   Title  Patient will have a decrease in R lateral hip pain to 2/10 or less at worst to promote ability to get up from a low surface more comfortably.    Baseline  8/10 R lateral hip pain at worst for the past 3 months (08/18/2019)    Time  6    Period  Weeks    Status  New    Target Date  09/29/19      PT LONG TERM GOAL #2   Title  Patient will have a decrease in L knee pain to 4/10 or less at worst to promote ability to stand up from sitting on a low surface with less difficulty.    Baseline  10/10 L knee pain at worst for the past 3 months (08/18/2019)    Time  6    Period  Weeks    Status  New    Target Date  09/29/19      PT LONG TERM GOAL #3   Title  Patient will improve bilateral hip extension and hip abduction strength by at least 1/2 MMT grade to promote ability to perform transfers with less difficulty.    Time  6    Period  Weeks    Status  New    Target Date  09/29/19      PT LONG TERM GOAL #4   Title  Pt will report minimal to no difficulty standing up after sitting on the couch for about 20 minutes as a demonstration of improved ability to perform transfers.    Baseline  Increased R lateral hip pain with standing up from a couch after prolonged sitting (08/18/2019)  Time  6    Period  Weeks    Status  New    Target Date  09/29/19            Plan - 09/12/19 1609    Personal Factors and Comorbidities  Age;Comorbidity 3+    Comorbidities  CHF, HTN, COPD, hx of aortic valve  replacement, hx of breast CA    Examination-Activity Limitations  Transfers    Stability/Clinical Decision Making  Stable/Uncomplicated    Rehab Potential  Good    Clinical Impairments Affecting Rehab Potential  (+) motivated, good response to previous therapies, (-) age, chronicity of condition    PT Frequency  2x / week    PT Duration  6 weeks    PT Treatment/Interventions  Iontophoresis 4mg /ml Dexamethasone;Patient/family education;Electrical Stimulation;Neuromuscular re-education;Manual techniques;Therapeutic exercise;Gait training;Therapeutic activities;Balance training;Aquatic Therapy;Functional mobility training;Dry needling    PT Next Visit Plan  isometric R glute med and max strengthening, B hip strengthening, lumbopelvic and femoral control, manual techniques, modalities PRN    PT Home Exercise Plan  bridge exercise, SLR, scoot stretch, standing quad stretch    Consulted and Agree with Plan of Care  Patient       Patient will benefit from skilled therapeutic intervention in order to improve the following deficits and impairments:  Pain, Improper body mechanics, Postural dysfunction, Decreased strength  Visit Diagnosis: Pain in right hip  Chronic pain of left knee  Muscle weakness (generalized)  Pain in left hip  Difficulty in walking, not elsewhere classified     Problem List Patient Active Problem List   Diagnosis Date Noted  . On Coumadin for atrial fibrillation ([place]) 06/30/2018  . Chronic anticoagulation 08/27/2017  . History of right breast cancer 08/27/2017  . Chronic obstructive pulmonary disease ([place]) 07/04/2015  . Controlled gout 06/06/2015  . Anxiety 02/09/2015  . Synovial cyst of popliteal space 02/09/2015  . Benign hypertension 02/09/2015  . Blood type A+ 02/09/2015  . Arterial branch occlusion of retina 02/09/2015  . Chronic constipation 02/09/2015  . Insomnia, persistent 02/09/2015  . Chronic combined systolic and diastolic CHF, NYHA class 1 ([place])  02/09/2015  . Dyslipidemia 02/09/2015  . Atrial fibrillation ([place]) 02/09/2015  . Arthritis urica 02/09/2015  . Hearing loss 02/09/2015  . History of open heart surgery 02/09/2015  . Chronic hoarseness 02/09/2015  . Cardiomegaly 02/09/2015  . Dysmetabolic syndrome 0000000  . Migraine with aura and without status migrainosus, not intractable 02/09/2015  . Adult BMI 30+ 02/09/2015  . Hypertensive pulmonary vascular disease ([place]) 02/09/2015  . Allergic rhinitis, seasonal 02/09/2015  . Vitamin D deficiency 02/09/2015  . History of mitral valve repair 10/01/2012  . History of aortic valve replacement 10/01/2012  . Congestive heart failure ([place]) 10/01/2012    Lieutenant Diego 09/12/2019, 4:10 PM  [facility] [address] [place], [address] Phone: [phone]   Fax:  [phone]  Name: [name] MRN: [id number] Date of Birth: 1941-06-04

## 2019-09-14 ENCOUNTER — Other Ambulatory Visit: Payer: Self-pay

## 2019-09-14 ENCOUNTER — Ambulatory Visit: Payer: Medicare Other

## 2019-09-14 DIAGNOSIS — M25562 Pain in left knee: Secondary | ICD-10-CM | POA: Diagnosis not present

## 2019-09-14 DIAGNOSIS — M25552 Pain in left hip: Secondary | ICD-10-CM | POA: Diagnosis not present

## 2019-09-14 DIAGNOSIS — G8929 Other chronic pain: Secondary | ICD-10-CM | POA: Diagnosis not present

## 2019-09-14 DIAGNOSIS — M25551 Pain in right hip: Secondary | ICD-10-CM | POA: Diagnosis not present

## 2019-09-14 DIAGNOSIS — M6281 Muscle weakness (generalized): Secondary | ICD-10-CM

## 2019-09-14 DIAGNOSIS — R262 Difficulty in walking, not elsewhere classified: Secondary | ICD-10-CM | POA: Diagnosis not present

## 2019-09-14 NOTE — Therapy (Signed)
[facility] [address] [place], [address] Phone: [phone]   Fax:  [phone]  Physical Therapy Treatment  Patient Details  Name: [name] MRN: [id number] Date of Birth: 01/31/1941 Referring Provider (PT): [name], MD   Encounter Date: 09/14/2019  PT End of Session - 09/14/19 1520    Visit Number  9    Number of Visits  13    Date for PT Re-Evaluation  09/29/19    Authorization Type  9    Authorization Time Period  10    PT Start Time  1520   pt arrived late   PT Stop Time  1600    PT Time Calculation (min)  40 min    Activity Tolerance  Patient tolerated treatment well    Behavior During Therapy  [facility] for tasks assessed/performed       Past Medical History:  Diagnosis Date  . Atrial fibrillation ([place])   . Breast cancer ([place]) 1992   positive, radiation  . CHF (congestive heart failure) ([place])   . COPD (chronic obstructive pulmonary disease) ([place])   . Dysrhythmia   . Gout   . History of aortic valve replacement   . Hyperlipidemia   . Hypertension     Past Surgical History:  Procedure Laterality Date  . ABDOMINAL HYSTERECTOMY  1996  . arm surgery  2019  . BREAST BIOPSY Right 1998   neg  . BREAST BIOPSY Right 2007   neg  . BREAST BIOPSY Right 1992   positive  . BREAST EXCISIONAL BIOPSY Left 2000   neg  . BREAST LUMPECTOMY Right 1992  . CARDIAC VALVE REPLACEMENT  2014  . CATARACT EXTRACTION W/PHACO Left 03/30/2018   Procedure: CATARACT EXTRACTION PHACO AND INTRAOCULAR LENS PLACEMENT (IOC);  Surgeon: Birder Robson, MD;  Location: [facility] ORS;  Service: Ophthalmology;  Laterality: Left;  [place] 00:31.9 AP% 12.1 CDE 3.87 Fluid Pack lot # [id number]  . FRACTURE SURGERY Left    ARM/ LEG  . LEG SURGERY      There were no vitals filed for this visit.  Subjective Assessment - 09/14/19 1521    Subjective  R hip is good, no pain or discomfort. Standing up from the couch is good,  better able to do so without using her hands without R hip or L knee pain. 0/10 R hip pain currently, 2/10 R hip pain at most for the past 7 days.    Pertinent History  L knee OA, R hip bursitis. Pt states she broke her L knee about 2 years ago. Also Had R hip pain which is most likely due to compensation. Has a hard time standing standing up from a chair and couch. Pt was sweeping her porch about 2 years ago and fell, fracturing her L knee and arm and wrist.  Pt took atorvastatin which caused muscle pain. Stopped taking the medication and muscles feel better. R hip pain has been there for years. Had PT for it which helped. Also sleeps on her R side as well. B knees feel better after stopping the atorvastatin. R hip bothers her more now instead.    Limitations  Lifting;Standing;Walking;House hold activities    How long can you sit comfortably?  unlimited    How long can you stand comfortably?  48min    How long can you walk comfortably?  30 min.    Patient Stated Goals  Get up from a chair more comfortably and more  easily.    Currently in Pain?  No/denies    Pain Score  0-No pain                               PT Education - 09/14/19 1523    Education provided  Yes    Education Details  ther-ex    [employer]) Educated  Patient    Methods  Explanation;Demonstration;Tactile cues;Verbal cues    Comprehension  Returned demonstration;Verbalized understanding      Objectives  Therapeutic exercise   Seated R hip ER 10x10seconds   Seated hip adduction ball and glute max squeeze 10x10seconds   Seated manually resisted hip extension   R 10x3   Standing hip abduction with B UE assist   R 10x5 seconds for 2 sets  L 10x5 seconds for 2 sets    Standing hip extension with B UE assist   R 10x 5 seconds for 2 sets  L 10x 5 seconds for 2 sets  SLS with B UEto 2 fingerassist for isometric closed chain glute med muscle contraction R 10x5 secondsfor 2  sets L 10x5 secondsfor 2 sets  Side stepping 30 ft to the R and 30 ft to the L for 2 sets  Forward step up onto 1st regular step with R LE and B UE assist, emphasis on pelvic and femoral control 10x3   Improved exercise technique, movement at target joints, use of target muscles after mod verbal, visual, tactile cues.    Response to treatment Pt tolerated session well without aggravtion of symptoms. Good muscle use felt with exercises.    Clinical impression Decreasing overall R lateral hip pain level based on subjective reports. Continued working on isometric, and concentric R glute strengthening to continue promoting proper stress to healing tissues as well as to help promote lumbopelvic and femoral control to help decrease stress to lateral hip. Pt will benefit from continued skilled physical therapy services to decrease pain, improve strength and function.    PT Short Term Goals - 08/18/19 1756      PT SHORT TERM GOAL #1   Title  Patient will be independent with her HEP to decrease pain, improve strength and ability to perform sit <> stand transfers.    Time  3    Period  Weeks    Status  New    Target Date  09/08/19        PT Long Term Goals - 08/18/19 1757      PT LONG TERM GOAL #1   Title  Patient will have a decrease in R lateral hip pain to 2/10 or less at worst to promote ability to get up from a low surface more comfortably.    Baseline  8/10 R lateral hip pain at worst for the past 3 months (08/18/2019)    Time  6    Period  Weeks    Status  New    Target Date  09/29/19      PT LONG TERM GOAL #2   Title  Patient will have a decrease in L knee pain to 4/10 or less at worst to promote ability to stand up from sitting on a low surface with less difficulty.    Baseline  10/10 L knee pain at worst for the past 3 months (08/18/2019)    Time  6    Period  Weeks    Status  New    Target Date  09/29/19      PT LONG TERM GOAL #3   Title   Patient will improve bilateral hip extension and hip abduction strength by at least 1/2 MMT grade to promote ability to perform transfers with less difficulty.    Time  6    Period  Weeks    Status  New    Target Date  09/29/19      PT LONG TERM GOAL #4   Title  Pt will report minimal to no difficulty standing up after sitting on the couch for about 20 minutes as a demonstration of improved ability to perform transfers.    Baseline  Increased R lateral hip pain with standing up from a couch after prolonged sitting (08/18/2019)    Time  6    Period  Weeks    Status  New    Target Date  09/29/19            Plan - 09/14/19 1523    Clinical Impression Statement  Decreasing overall R lateral hip pain level based on subjective reports. Continued working on isometric, and concentric R glute strengthening to continue promoting proper stress to healing tissues as well as to help promote lumbopelvic and femoral control to help decrease stress to lateral hip. Pt will benefit from continued skilled physical therapy services to decrease pain, improve strength and function.    Personal Factors and Comorbidities  Age;Comorbidity 3+    Comorbidities  CHF, HTN, COPD, hx of aortic valve replacement, hx of breast CA    Examination-Activity Limitations  Transfers    Stability/Clinical Decision Making  Stable/Uncomplicated    Rehab Potential  Good    Clinical Impairments Affecting Rehab Potential  (+) motivated, good response to previous therapies, (-) age, chronicity of condition    PT Frequency  2x / week    PT Duration  6 weeks    PT Treatment/Interventions  Iontophoresis 4mg /ml Dexamethasone;Patient/family education;Electrical Stimulation;Neuromuscular re-education;Manual techniques;Therapeutic exercise;Gait training;Therapeutic activities;Balance training;Aquatic Therapy;Functional mobility training;Dry needling    PT Next Visit Plan  isometric R glute med and max strengthening, B hip strengthening,  lumbopelvic and femoral control, manual techniques, modalities PRN    PT Home Exercise Plan  bridge exercise, SLR, scoot stretch, standing quad stretch    Consulted and Agree with Plan of Care  Patient       Patient will benefit from skilled therapeutic intervention in order to improve the following deficits and impairments:  Pain, Improper body mechanics, Postural dysfunction, Decreased strength  Visit Diagnosis: Pain in right hip  Chronic pain of left knee  Muscle weakness (generalized)     Problem List Patient Active Problem List   Diagnosis Date Noted  . On Coumadin for atrial fibrillation ([place]) 06/30/2018  . Chronic anticoagulation 08/27/2017  . History of right breast cancer 08/27/2017  . Chronic obstructive pulmonary disease ([place]) 07/04/2015  . Controlled gout 06/06/2015  . Anxiety 02/09/2015  . Synovial cyst of popliteal space 02/09/2015  . Benign hypertension 02/09/2015  . Blood type A+ 02/09/2015  . Arterial branch occlusion of retina 02/09/2015  . Chronic constipation 02/09/2015  . Insomnia, persistent 02/09/2015  . Chronic combined systolic and diastolic CHF, NYHA class 1 ([place]) 02/09/2015  . Dyslipidemia 02/09/2015  . Atrial fibrillation ([place]) 02/09/2015  . Arthritis urica 02/09/2015  . Hearing loss 02/09/2015  . History of open heart surgery 02/09/2015  . Chronic hoarseness 02/09/2015  . Cardiomegaly 02/09/2015  . Dysmetabolic syndrome 0000000  . Migraine with aura and without status  migrainosus, not intractable 02/09/2015  . Adult BMI 30+ 02/09/2015  . Hypertensive pulmonary vascular disease ([place]) 02/09/2015  . Allergic rhinitis, seasonal 02/09/2015  . Vitamin D deficiency 02/09/2015  . History of mitral valve repair 10/01/2012  . History of aortic valve replacement 10/01/2012  . Congestive heart failure ([place]) 10/01/2012    Joneen Boers PT, DPT   09/14/2019, 6:48 PM  [facility] [address], [place], [address] Phone: [phone]   Fax:  [phone]  Name: [name] MRN: [id number] Date of Birth: 11-Jul-1941

## 2019-09-19 ENCOUNTER — Ambulatory Visit (INDEPENDENT_AMBULATORY_CARE_PROVIDER_SITE_OTHER): Payer: Medicare Other | Admitting: Family Medicine

## 2019-09-19 ENCOUNTER — Encounter: Payer: Self-pay | Admitting: Family Medicine

## 2019-09-19 ENCOUNTER — Ambulatory Visit: Payer: Medicare Other

## 2019-09-19 ENCOUNTER — Other Ambulatory Visit: Payer: Self-pay

## 2019-09-19 VITALS — BP 122/78 | HR 74 | Temp 96.6°F | Resp 16 | Ht 63.0 in | Wt 186.5 lb

## 2019-09-19 DIAGNOSIS — M25562 Pain in left knee: Secondary | ICD-10-CM | POA: Diagnosis not present

## 2019-09-19 DIAGNOSIS — R262 Difficulty in walking, not elsewhere classified: Secondary | ICD-10-CM | POA: Diagnosis not present

## 2019-09-19 DIAGNOSIS — R739 Hyperglycemia, unspecified: Secondary | ICD-10-CM | POA: Diagnosis not present

## 2019-09-19 DIAGNOSIS — I5042 Chronic combined systolic (congestive) and diastolic (congestive) heart failure: Secondary | ICD-10-CM | POA: Diagnosis not present

## 2019-09-19 DIAGNOSIS — M25551 Pain in right hip: Secondary | ICD-10-CM | POA: Diagnosis not present

## 2019-09-19 DIAGNOSIS — G8929 Other chronic pain: Secondary | ICD-10-CM | POA: Diagnosis not present

## 2019-09-19 DIAGNOSIS — D1721 Benign lipomatous neoplasm of skin and subcutaneous tissue of right arm: Secondary | ICD-10-CM

## 2019-09-19 DIAGNOSIS — M6281 Muscle weakness (generalized): Secondary | ICD-10-CM

## 2019-09-19 DIAGNOSIS — M25552 Pain in left hip: Secondary | ICD-10-CM | POA: Diagnosis not present

## 2019-09-19 NOTE — Therapy (Signed)
[place] [facility] [facility] [address], [place], [address] Phone: [phone]   Fax:  [phone]  Physical Therapy Treatment Progress Report And Discharge Summary (08/18/2019 - 09/19/2019)  Patient Details  Name: [name] MRN: [id number] Date of Birth: 08-30-1941 Referring Provider (PT): [name], MD   Encounter Date: 09/19/2019  PT End of Session - 09/19/19 1455    Visit Number  10    Number of Visits  13    Date for PT Re-Evaluation  09/29/19    Authorization Type  10    Authorization Time Period  10    PT Start Time  1455    PT Stop Time  1525    PT Time Calculation (min)  30 min    Activity Tolerance  Patient tolerated treatment well    Behavior During Therapy  [facility] for tasks assessed/performed       Past Medical History:  Diagnosis Date  . Atrial fibrillation ([place])   . Breast cancer ([place]) 1992   positive, radiation  . CHF (congestive heart failure) ([place])   . COPD (chronic obstructive pulmonary disease) ([place])   . Dysrhythmia   . Gout   . History of aortic valve replacement   . Hyperlipidemia   . Hypertension     Past Surgical History:  Procedure Laterality Date  . ABDOMINAL HYSTERECTOMY  1996  . arm surgery  2019  . BREAST BIOPSY Right 1998   neg  . BREAST BIOPSY Right 2007   neg  . BREAST BIOPSY Right 1992   positive  . BREAST EXCISIONAL BIOPSY Left 2000   neg  . BREAST LUMPECTOMY Right 1992  . CARDIAC VALVE REPLACEMENT  2014  . CATARACT EXTRACTION W/PHACO Left 03/30/2018   Procedure: CATARACT EXTRACTION PHACO AND INTRAOCULAR LENS PLACEMENT (IOC);  Surgeon: Birder Robson, MD;  Location: [facility] ORS;  Service: Ophthalmology;  Laterality: Left;  [place] 00:31.9 AP% 12.1 CDE 3.87 Fluid Pack lot # [id number]  . FRACTURE SURGERY Left    ARM/ LEG  . LEG SURGERY      There were no vitals filed for this visit.  Subjective Assessment - 09/19/19 1457    Subjective  R hip is good and it  has been raining and snowing. 1/10 R hip pain.and 1/10 L knee pain at most for the past 7 days.  Pt states doing her PT exercises at home (seated R hip ER, S/L R hip abduction, sit <> stand with emphasis on R hip extension, side stepping, wedding march, rememers her HEP).  Pt states that she is not as stiff as she was when she goes to work. Feels like she can graduate PT today and continue with her HEP at home. Does not need a printout copy of her exercises at home    Pertinent History  L knee OA, R hip bursitis. Pt states she broke her L knee about 2 years ago. Also Had R hip pain which is most likely due to compensation. Has a hard time standing standing up from a chair and couch. Pt was sweeping her porch about 2 years ago and fell, fracturing her L knee and arm and wrist.  Pt took atorvastatin which caused muscle pain. Stopped taking the medication and muscles feel better. R hip pain has been there for years. Had PT for it which helped. Also sleeps on her R side as well. B knees feel better after stopping the atorvastatin. R hip bothers her more now instead.  Limitations  Lifting;Standing;Walking;House hold activities    How long can you sit comfortably?  unlimited    How long can you stand comfortably?  30mn    How long can you walk comfortably?  30 min.    Patient Stated Goals  Get up from a chair more comfortably and more easily.    Currently in Pain?  No/denies    Pain Score  0-No pain         [facility] PT Assessment - 09/19/19 1503      Strength   Right Hip Extension  4+/5    Right Hip ABduction  4+/5    Left Hip Extension  4+/5    Left Hip ABduction  4+/5                          Objectives   Therapeutic exercise   Manually resisted prone hip extension, S/L hip abduction 1-2x each way for each LE  No R hip pain with manually resisted hip extension and hip abduction  Improved B hip extension and B hip abduction strength since initial evaluation  Reviewed  progress with hip strength with pt  L S/L R hip abduction 10x3 to promote increased concentric contraction level of difficulty for glute med   SLS with B UE to 2 finger assist for isometric closed chain glute med muscle contraction  R 10x5 seconds  L 10x5 seconds   Ascending and descending 4 regular steps with B UE assist, 3x  Improved B femoral control, no pain     Improved exercise technique, movement at target joints, use of target muscles after mod verbal, visual, tactile cues.    Response to treatment  Pt tolerated session well without aggravtion of symptoms. Good muscle use felt with exercises.    Clinical impression  Pt demonstrates significant improvement in R hip and L knee pain, as well as improved bilateral hip abduction and extension strength and decreased difficulty standing up from prolonged sitting on her couch since initial evaluation. Pt also demonstrates independence and consistency with her HEP. Skilled physical therapy services discharged with patient continuing her progress with her exercises at home.      PT Education - 09/19/19 1458    Education provided  Yes    Education Details  ther-ex    [employer] Educated  Patient    Methods  Explanation;Demonstration;Tactile cues;Verbal cues    Comprehension  Returned demonstration;Verbalized understanding       PT Short Term Goals - 09/19/19 1542      PT SHORT TERM GOAL #1   Title  Patient will be independent with her HEP to decrease pain, improve strength and ability to perform sit <> stand transfers.    Baseline  Pt demonstrates consistency and independence with her HEP (09/19/2019)    Time  3    Period  Weeks    Status  Achieved    Target Date  09/08/19        PT Long Term Goals - 09/19/19 1459      PT LONG TERM GOAL #1   Title  Patient will have a decrease in R lateral hip pain to 2/10 or less at worst to promote ability to get up from a low surface more comfortably.    Baseline  8/10 R lateral hip  pain at worst for the past 3 months (08/18/2019); 3/10 at most for the past 7 days (09/19/2019)    Time  6  Period  Weeks    Status  Partially Met    Target Date  09/29/19      PT LONG TERM GOAL #2   Title  Patient will have a decrease in L knee pain to 4/10 or less at worst to promote ability to stand up from sitting on a low surface with less difficulty.    Baseline  10/10 L knee pain at worst for the past 3 months (08/18/2019); 1/10 at most for the past 7 days (09/19/2019)    Time  6    Period  Weeks    Status  Achieved    Target Date  09/29/19      PT LONG TERM GOAL #3   Title  Patient will improve bilateral hip extension and hip abduction strength by at least 1/2 MMT grade to promote ability to perform transfers with less difficulty.    Time  6    Period  Weeks    Status  Achieved    Target Date  09/29/19      PT LONG TERM GOAL #4   Title  Pt will report minimal to no difficulty standing up after sitting on the couch for about 20 minutes as a demonstration of improved ability to perform transfers.    Baseline  Increased R lateral hip pain with standing up from a couch after prolonged sitting (08/18/2019); R hip is a little stiff but ok, 3/10 pain with standing up and minimal difficulty performing task (09/19/2019)    Time  6    Period  Weeks    Status  Achieved    Target Date  09/29/19            Plan - 09/19/19 1459    Clinical Impression Statement  Pt demonstrates significant improvement in R hip and L knee pain, as well as improved bilateral hip abduction and extension strength and decreased difficulty standing up from prolonged sitting on her couch since initial evaluation. Pt also demonstrates independence and consistency with her HEP. Skilled physical therapy services discharged with patient continuing her progress with her exercises at home.    Personal Factors and Comorbidities  Age;Comorbidity 3+    Comorbidities  CHF, HTN, COPD, hx of aortic valve replacement, hx  of breast CA    Examination-Activity Limitations  Transfers    Stability/Clinical Decision Making  Stable/Uncomplicated    Clinical Decision Making  Low    Rehab Potential  Good    Clinical Impairments Affecting Rehab Potential  (+) motivated, good response to previous therapies, (-) age, chronicity of condition    PT Frequency  --    PT Duration  --    PT Treatment/Interventions  Patient/family education;Neuromuscular re-education;Therapeutic exercise;Gait training;Therapeutic activities;Functional mobility training;Manual techniques    PT Next Visit Plan  Continue progress with her exercises at home    PT Home Exercise Plan  seated R hip ER, S/L R hip abduction, sit <> stand with emphasis on R hip extension, side stepping, wedding march    Consulted and Agree with Plan of Care  Patient       Patient will benefit from skilled therapeutic intervention in order to improve the following deficits and impairments:  Pain, Improper body mechanics, Postural dysfunction, Decreased strength  Visit Diagnosis: Pain in right hip  Chronic pain of left knee  Muscle weakness (generalized)     Problem List Patient Active Problem List   Diagnosis Date Noted  . On Coumadin for atrial fibrillation ([place]) 06/30/2018  .  Chronic anticoagulation 08/27/2017  . History of right breast cancer 08/27/2017  . Chronic obstructive pulmonary disease ([place]) 07/04/2015  . Controlled gout 06/06/2015  . Anxiety 02/09/2015  . Synovial cyst of popliteal space 02/09/2015  . Benign hypertension 02/09/2015  . Blood type A+ 02/09/2015  . Arterial branch occlusion of retina 02/09/2015  . Chronic constipation 02/09/2015  . Insomnia, persistent 02/09/2015  . Chronic combined systolic and diastolic CHF, NYHA class 1 ([place]) 02/09/2015  . Dyslipidemia 02/09/2015  . Atrial fibrillation ([place]) 02/09/2015  . Arthritis urica 02/09/2015  . Hearing loss 02/09/2015  . History of open heart surgery 02/09/2015  . Chronic  hoarseness 02/09/2015  . Cardiomegaly 02/09/2015  . Dysmetabolic syndrome 14/78/2956  . Migraine with aura and without status migrainosus, not intractable 02/09/2015  . Adult BMI 30+ 02/09/2015  . Hypertensive pulmonary vascular disease ([place]) 02/09/2015  . Allergic rhinitis, seasonal 02/09/2015  . Vitamin D deficiency 02/09/2015  . History of mitral valve repair 10/01/2012  . History of aortic valve replacement 10/01/2012  . Congestive heart failure ([place]) 10/01/2012    Thank you for your referral.    Joneen Boers PT, DPT   09/19/2019, 3:54 PM  [place] [facility] [address], [place], [address] Phone: [phone]   Fax:  [phone]  Name: [name] MRN: [id number] Date of Birth: [birth year]

## 2019-09-19 NOTE — Progress Notes (Signed)
Name: [name]   MRN: [id number]    DOB: 09-28-1940   Date:09/20/2019       Progress Note  Subjective  Chief Complaint  Chief Complaint  Patient presents with  . Mass  . Shoulder Stiffness    Right Shoulder Stiffness    HPI  Lump on her right arm: she has a history of lipomas but states found a new area on right upper arm about one month ago while taking a shower, she denies pain, not growing, but her children have been worried about it and asked her to get checked out   Patient Active Problem List   Diagnosis Date Noted  . On Coumadin for atrial fibrillation ([place]) 06/30/2018  . Chronic anticoagulation 08/27/2017  . History of right breast cancer 08/27/2017  . Chronic obstructive pulmonary disease ([place]) 07/04/2015  . Controlled gout 06/06/2015  . Anxiety 02/09/2015  . Synovial cyst of popliteal space 02/09/2015  . Benign hypertension 02/09/2015  . Blood type A+ 02/09/2015  . Arterial branch occlusion of retina 02/09/2015  . Chronic constipation 02/09/2015  . Insomnia, persistent 02/09/2015  . Chronic combined systolic and diastolic CHF, NYHA class 1 ([place]) 02/09/2015  . Dyslipidemia 02/09/2015  . Atrial fibrillation ([place]) 02/09/2015  . Arthritis urica 02/09/2015  . Hearing loss 02/09/2015  . History of open heart surgery 02/09/2015  . Chronic hoarseness 02/09/2015  . Cardiomegaly 02/09/2015  . Dysmetabolic syndrome 0000000  . Migraine with aura and without status migrainosus, not intractable 02/09/2015  . Adult BMI 30+ 02/09/2015  . Hypertensive pulmonary vascular disease ([place]) 02/09/2015  . Allergic rhinitis, seasonal 02/09/2015  . Vitamin D deficiency 02/09/2015  . History of mitral valve repair 10/01/2012  . History of aortic valve replacement 10/01/2012  . Congestive heart failure ([place]) 10/01/2012    Past Surgical History:  Procedure Laterality Date  . ABDOMINAL HYSTERECTOMY  1996  . arm surgery  2019  . BREAST BIOPSY Right 1998   neg  . BREAST  BIOPSY Right 2007   neg  . BREAST BIOPSY Right 1992   positive  . BREAST EXCISIONAL BIOPSY Left 2000   neg  . BREAST LUMPECTOMY Right 1992  . CARDIAC VALVE REPLACEMENT  2014  . CATARACT EXTRACTION W/PHACO Left 03/30/2018   Procedure: CATARACT EXTRACTION PHACO AND INTRAOCULAR LENS PLACEMENT (IOC);  Surgeon: Birder Robson, MD;  Location: [facility] ORS;  Service: Ophthalmology;  Laterality: Left;  [place] 00:31.9 AP% 12.1 CDE 3.87 Fluid Pack lot # [id number]  . FRACTURE SURGERY Left    ARM/ LEG  . LEG SURGERY      Family History  Problem Relation Age of Onset  . Cancer Mother   . Diabetes Mother   . Hypertension Mother   . Breast cancer Mother 25  . Hypertension Father   . Heart failure Father   . Cancer Brother        bladder  . Cancer Sister        breast  . Breast cancer Sister 32  . Hypertension Son   . Hypertension Daughter   . Breast cancer Sister 72      Current Outpatient Medications:  .  acetaminophen (TYLENOL) 500 MG tablet, Take 1,000-1,500 mg by mouth every 6 (six) hours as needed for mild pain., Disp: , Rfl:  .  allopurinol (ZYLOPRIM) 300 MG tablet, Take 1 tablet (300 mg total) by mouth daily., Disp: 90 tablet, Rfl: 1 .  clindamycin (CLEOCIN) 300 MG capsule, TAKE 600 MG (2 TABLETS)  1 HOUR PRIOR  TO DENTAL PROCEDURE, Disp: , Rfl:  .  diclofenac sodium (VOLTAREN) 1 % GEL, Apply 4 g topically 4 (four) times daily., Disp: 100 g, Rfl: 2 .  Ergocalciferol (VITAMIN D2) 50 MCG (2000 UT) TABS, Take 2,000 Units by mouth daily., Disp: , Rfl:  .  ezetimibe (ZETIA) 10 MG tablet, Take 1 tablet (10 mg total) by mouth daily., Disp: 90 tablet, Rfl: 3 .  fluticasone (FLONASE) 50 MCG/ACT nasal spray, Place 2 sprays into both nostrils daily., Disp: 16 g, Rfl: 6 .  metFORMIN (GLUCOPHAGE) 500 MG tablet, TAKE 1 TABLET (500 MG TOTAL) BY MOUTH 2 (TWO) TIMES DAILY WITH A MEAL., Disp: 60 tablet, Rfl: 2 .  metoprolol tartrate (LOPRESSOR) 25 MG tablet, Take 1 tablet (25 mg total) by mouth 2 (two)  times daily., Disp: 180 tablet, Rfl: 1 .  potassium chloride SA (KLOR-CON M20) 20 MEQ tablet, Take 1 tablet (20 mEq total) by mouth daily., Disp: 90 tablet, Rfl: 1 .  tiotropium (SPIRIVA HANDIHALER) 18 MCG inhalation capsule, INHALE 1 CASPULE VIA HANDIHALER ONCE A DAY AT THE SAME TIME EVERY DAY, Disp: 30 capsule, Rfl: 1 .  torsemide (DEMADEX) 20 MG tablet, TAKE 1 TABLET BY MOUTH EVERY DAY, Disp: 90 tablet, Rfl: 1 .  traZODone (DESYREL) 50 MG tablet, Take 1 tablet (50 mg total) by mouth at bedtime., Disp: 90 tablet, Rfl: 0 .  warfarin (COUMADIN) 5 MG tablet, Take 5 mg by mouth daily., Disp: , Rfl:   Allergies  Allergen Reactions  . Ace Inhibitors Cough and Other (See Comments)  . Amoxicillin-Pot Clavulanate Nausea And Vomiting  . Codeine Nausea And Vomiting  . Penicillins Nausea And Vomiting and Other (See Comments)    Has patient had a PCN reaction causing immediate rash, facial/tongue/throat swelling, SOB or lightheadedness with hypotension: No Has patient had a PCN reaction causing severe rash involving mucus membranes or skin necrosis: No Has patient had a PCN reaction that required hospitalization No Has patient had a PCN reaction occurring within the last 10 years: No If all of the above answers are "NO", then may proceed with Cephalosporin use.  . Rosuvastatin Other (See Comments)    Reaction:  Joint pain   . Tetanus-Diphtheria Toxoids Td Swelling    I personally reviewed active problem list, medication list, allergies with the patient/caregiver today.   ROS  Ten systems reviewed and is negative except as mentioned in HPI   Objective   Vitals:   09/19/19 0911  BP: 122/78  Pulse: 74  Resp: 16  Temp: (!) 96.6 F (35.9 C)  TempSrc: Temporal  SpO2: 96%  Weight: 186 lb 8 oz (84.6 kg)  Height: 5\' 3"  (1.6 m)    Body mass index is 33.04 kg/m.  Physical Exam  Constitutional: Patient appears well-developed and well-nourished. Obese  No distress.  HEENT: head  atraumatic, normocephalic, pupils equal and reactive to light Cardiovascular: Normal rate, irregular rhythm, 2/6 SEM No BLE edema. Pulmonary/Chest: Effort normal and breath sounds normal. No respiratory distress. Abdominal: Soft.  There is no tenderness. Muscular Skeletal: she has a lipoma on left upper back, area in question today seems to also be a lipoma , on right upper arm, near axilla, no rashes overlying the area, no pain during palpation, smooth borders  Psychiatric: Patient has a normal mood and affect. behavior is normal. Judgment and thought content normal.  Recent Results (from the past 2160 hour(s))  Cologuard     Status: None   Collection Time: 06/23/19 12:00 AM  Result Value Ref Range   Cologuard Negative Negative  Novel Coronavirus, NAA ([facility])     Status: None   Collection Time: 06/28/19 11:25 AM   Specimen: Oropharyngeal(OP) collection in vial transport medium   OROPHARYNGEA  TESTING  Result Value Ref Range   SARS-CoV-2, NAA Not Detected Not Detected    Comment: This nucleic acid amplification test was developed and its performance characteristics determined by [employer]. Nucleic acid amplification tests include PCR and TMA. This test has not been FDA cleared or approved. This test has been authorized by FDA under an Emergency Use Authorization (EUA). This test is only authorized for the duration of time the declaration that circumstances exist justifying the authorization of the emergency use of in vitro diagnostic tests for detection of SARS-CoV-2 virus and/or diagnosis of COVID-19 infection under section 564(b)(1) of the Act, 21 U.S.C. [id number]) (1), unless the authorization is terminated or revoked sooner. When diagnostic testing is negative, the possibility of a false negative result should be considered in the context of a patient's recent exposures and the presence of clinical signs and symptoms consistent with COVID-19. An individual without  symptoms of COVID-19 and who is not shedding SARS-CoV-2 virus would  expect to have a negative (not detected) result in this assay.   COMPLETE METABOLIC PANEL WITH GFR     Status: Abnormal   Collection Time: 09/19/19  8:37 AM  Result Value Ref Range   Glucose, Bld 105 (H) 65 - 99 mg/dL    Comment: .            Fasting reference interval . For someone without known diabetes, a glucose value between 100 and 125 mg/dL is consistent with prediabetes and should be confirmed with a follow-up test. .    BUN 20 7 - 25 mg/dL   Creat 0.84 0.60 - 0.93 mg/dL    Comment: For patients >80 years of age, the reference limit for Creatinine is approximately 13% higher for people identified as African-American. .    GFR, Est Non African American 68 > OR = 60 mL/min/1.59m2   GFR, Est African American 78 > OR = 60 mL/min/1.71m2   BUN/Creatinine Ratio NOT APPLICABLE 6 - 22 (calc)   Sodium 142 135 - 146 mmol/L   Potassium 3.3 (L) 3.5 - 5.3 mmol/L   Chloride 104 98 - 110 mmol/L   CO2 31 20 - 32 mmol/L   Calcium 9.4 8.6 - 10.4 mg/dL   Total Protein 6.9 6.1 - 8.1 g/dL   Albumin 4.1 3.6 - 5.1 g/dL   Globulin 2.8 1.9 - 3.7 g/dL (calc)   AG Ratio 1.5 1.0 - 2.5 (calc)   Total Bilirubin 0.5 0.2 - 1.2 mg/dL   Alkaline phosphatase (APISO) 66 37 - 153 U/L   AST 22 10 - 35 U/L   ALT 20 6 - 29 U/L  CBC with Differential/Platelet     Status: None   Collection Time: 09/19/19  8:37 AM  Result Value Ref Range   WBC 7.0 3.8 - 10.8 Thousand/uL   RBC 4.56 3.80 - 5.10 Million/uL   Hemoglobin 13.1 11.7 - 15.5 g/dL   HCT 40.3 35.0 - 45.0 %   MCV 88.4 80.0 - 100.0 fL   MCH 28.7 27.0 - 33.0 pg   MCHC 32.5 32.0 - 36.0 g/dL   RDW 13.7 11.0 - 15.0 %   Platelets 237 140 - 400 Thousand/uL   MPV 11.3 7.5 - 12.5 fL   Neutro Abs 2,548 1,500 -  7,800 cells/uL   Lymphs Abs 3,794 850 - 3,900 cells/uL   Absolute Monocytes 518 200 - 950 cells/uL   Eosinophils Absolute 112 15 - 500 cells/uL   Basophils Absolute 28 0 - 200  cells/uL   Neutrophils Relative % 36.4 %   Total Lymphocyte 54.2 %   Monocytes Relative 7.4 %   Eosinophils Relative 1.6 %   Basophils Relative 0.4 %  Hemoglobin A1c     Status: Abnormal   Collection Time: 09/19/19  8:37 AM  Result Value Ref Range   Hgb A1c MFr Bld 5.7 (H) <5.7 % of total Hgb    Comment: For someone without known diabetes, a hemoglobin  A1c value between 5.7% and 6.4% is consistent with prediabetes and should be confirmed with a  follow-up test. . For someone with known diabetes, a value <7% indicates that their diabetes is well controlled. A1c targets should be individualized based on duration of diabetes, age, comorbid conditions, and other considerations. . This assay result is consistent with an increased risk of diabetes. . Currently, no consensus exists regarding use of hemoglobin A1c for diagnosis of diabetes for children. .    Mean Plasma Glucose 117 (calc)   eAG (mmol/L) 6.5 (calc)      PHQ2/9: Depression screen [facility] 2/9 09/19/2019 07/27/2019 06/22/2019 06/14/2019 05/06/2019  Decreased Interest 0 0 0 0 0  Down, Depressed, Hopeless 0 0 0 0 0  PHQ - 2 Score 0 0 0 0 0  Altered sleeping 0 0 0 0 0  Tired, decreased energy 0 0 0 0 0  Change in appetite 0 0 0 0 0  Feeling bad or failure about yourself  0 0 0 0 0  Trouble concentrating 0 0 0 0 0  Moving slowly or fidgety/restless 0 0 0 0 0  Suicidal thoughts 0 0 0 0 0  PHQ-9 Score 0 0 0 0 0  Difficult doing work/chores Not difficult at all Not difficult at all Not difficult at all - Not difficult at all  Some recent data might be hidden    phq 9 is negative   Fall Risk: Fall Risk  09/19/2019 07/27/2019 06/22/2019 05/06/2019 03/22/2019  Falls in the past year? 0 0 0 0 0  Number falls in past yr: 0 0 0 0 0  Injury with Fall? 0 0 0 0 0  Comment - - - - -  Risk for fall due to : - - - - -  Follow up - - - - -      Assessment & Plan  1. Lipoma of right upper extremity  Discussed what it is,  giving her a print out, discussed referral to surgeon but she states not bothering her at this time.

## 2019-09-19 NOTE — Patient Instructions (Signed)
Lipoma  A lipoma is a noncancerous (benign) tumor that is made up of fat cells. This is a very common type of soft-tissue growth. Lipomas are usually found under the skin (subcutaneous). They may occur in any tissue of the body that contains fat. Common areas for lipomas to appear include the back, arms, shoulders, buttocks, and thighs. Lipomas grow slowly, and they are usually painless. Most lipomas do not cause problems and do not require treatment. What are the causes? The cause of this condition is not known. What increases the risk? You are more likely to develop this condition if:  You are 40-60 years old.  You have a family history of lipomas. What are the signs or symptoms? A lipoma usually appears as a small, round bump under the skin. In most cases, the lump will:  Feel soft or rubbery.  Not cause pain or other symptoms. However, if a lipoma is located in an area where it pushes on nerves, it can become painful or cause other symptoms. How is this diagnosed? A lipoma can usually be diagnosed with a physical exam. You may also have tests to confirm the diagnosis and to rule out other conditions. Tests may include:  Imaging tests, such as a CT scan or an MRI.  Removal of a tissue sample to be looked at under a microscope (biopsy). How is this treated? Treatment for this condition depends on the size of the lipoma and whether it is causing any symptoms.  For small lipomas that are not causing problems, no treatment is needed.  If a lipoma is bigger or it causes problems, surgery may be done to remove the lipoma. Lipomas can also be removed to improve appearance. Most often, the procedure is done after applying a medicine that numbs the area (local anesthetic).  Liposuction may be done to reduce the size of the lipoma before it is removed through surgery, or it may be done to remove the lipoma. Lipomas are removed with this method in order to limit incision size and scarring. A  liposuction tube is inserted through a small incision into the lipoma, and the contents of the lipoma are removed through the tube with suction. Follow these instructions at home:  Watch your lipoma for any changes.  Keep all follow-up visits as told by your health care provider. This is important. Contact a health care provider if:  Your lipoma becomes larger or hard.  Your lipoma becomes painful, red, or increasingly swollen. These could be signs of infection or a more serious condition. Get help right away if:  You develop tingling or numbness in an area near the lipoma. This could indicate that the lipoma is causing nerve damage. Summary  A lipoma is a noncancerous tumor that is made up of fat cells.  Most lipomas do not cause problems and do not require treatment.  If a lipoma is bigger or it causes problems, surgery may be done to remove the lipoma.  Contact a health care provider if your lipoma becomes larger or hard, or if it becomes painful, red, or increasingly swollen. Pain, redness, and swelling could be signs of infection or a more serious condition. This information is not intended to replace advice given to you by your health care provider. Make sure you discuss any questions you have with your health care provider. Document Revised: 04/11/2019 Document Reviewed: 04/11/2019 Elsevier Patient Education  2020 Elsevier Inc.  

## 2019-09-20 ENCOUNTER — Other Ambulatory Visit: Payer: Self-pay | Admitting: Family Medicine

## 2019-09-20 DIAGNOSIS — I5042 Chronic combined systolic (congestive) and diastolic (congestive) heart failure: Secondary | ICD-10-CM

## 2019-09-20 LAB — CBC WITH DIFFERENTIAL/PLATELET
Absolute Monocytes: 518 cells/uL (ref 200–950)
Basophils Absolute: 28 cells/uL (ref 0–200)
Basophils Relative: 0.4 %
Eosinophils Absolute: 112 cells/uL (ref 15–500)
Eosinophils Relative: 1.6 %
HCT: 40.3 % (ref 35.0–45.0)
Hemoglobin: 13.1 g/dL (ref 11.7–15.5)
Lymphs Abs: 3794 cells/uL (ref 850–3900)
MCH: 28.7 pg (ref 27.0–33.0)
MCHC: 32.5 g/dL (ref 32.0–36.0)
MCV: 88.4 fL (ref 80.0–100.0)
MPV: 11.3 fL (ref 7.5–12.5)
Monocytes Relative: 7.4 %
Neutro Abs: 2548 cells/uL (ref 1500–7800)
Neutrophils Relative %: 36.4 %
Platelets: 237 10*3/uL (ref 140–400)
RBC: 4.56 10*6/uL (ref 3.80–5.10)
RDW: 13.7 % (ref 11.0–15.0)
Total Lymphocyte: 54.2 %
WBC: 7 10*3/uL (ref 3.8–10.8)

## 2019-09-20 LAB — COMPLETE METABOLIC PANEL WITH GFR
AG Ratio: 1.5 (calc) (ref 1.0–2.5)
ALT: 20 U/L (ref 6–29)
AST: 22 U/L (ref 10–35)
Albumin: 4.1 g/dL (ref 3.6–5.1)
Alkaline phosphatase (APISO): 66 U/L (ref 37–153)
BUN: 20 mg/dL (ref 7–25)
CO2: 31 mmol/L (ref 20–32)
Calcium: 9.4 mg/dL (ref 8.6–10.4)
Chloride: 104 mmol/L (ref 98–110)
Creat: 0.84 mg/dL (ref 0.60–0.93)
GFR, Est African American: 78 mL/min/{1.73_m2} (ref >=60)
GFR, Est Non African American: 68 mL/min/{1.73_m2} (ref >=60)
Globulin: 2.8 g/dL (calc) (ref 1.9–3.7)
Glucose, Bld: 105 mg/dL — ABNORMAL HIGH (ref 65–99)
Potassium: 3.3 mmol/L — ABNORMAL LOW (ref 3.5–5.3)
Sodium: 142 mmol/L (ref 135–146)
Total Bilirubin: 0.5 mg/dL (ref 0.2–1.2)
Total Protein: 6.9 g/dL (ref 6.1–8.1)

## 2019-09-20 LAB — HEMOGLOBIN A1C
Hgb A1c MFr Bld: 5.7 % of total Hgb — ABNORMAL HIGH (ref <5.7)
Mean Plasma Glucose: 117 (calc)
eAG (mmol/L): 6.5 (calc)

## 2019-09-20 MED ORDER — POTASSIUM CHLORIDE CRYS ER 20 MEQ PO TBCR: 20.0000 meq | EXTENDED_RELEASE_TABLET | Freq: Every day | ORAL | 1 refills | Status: DC

## 2019-09-29 ENCOUNTER — Ambulatory Visit: Payer: Medicare Other | Attending: Internal Medicine

## 2019-09-29 DIAGNOSIS — Z23 Encounter for immunization: Secondary | ICD-10-CM

## 2019-10-03 NOTE — Progress Notes (Signed)
   Covid-19 Vaccination Clinic  Name:  [name]    MRN: [id number] DOB: Jan 31, 1941  09/29/2019  Ms. [name] was observed post Covid-19 immunization for 15 minutes without incidence. She was provided with Vaccine Information Sheet and instruction to access the V-Safe system.   Ms. [name] was instructed to call 911 with any severe reactions post vaccine: Marland Kitchen Difficulty breathing  . Swelling of your face and throat  . A fast heartbeat  . A bad rash all over your body  . Dizziness and weakness    Immunizations Administered    Name Date Dose VIS Date Route   Moderna COVID-19 Vaccine 09/29/2019  6:00 PM 0.5 mL 08/09/2019 Intramuscular   Manufacturer: Moderna   Lot: [id number]   [id number]

## 2019-10-13 DIAGNOSIS — I480 Paroxysmal atrial fibrillation: Secondary | ICD-10-CM | POA: Diagnosis not present

## 2019-10-27 ENCOUNTER — Ambulatory Visit: Payer: Medicare Other

## 2019-11-01 ENCOUNTER — Ambulatory Visit: Payer: Medicare Other

## 2019-11-01 ENCOUNTER — Ambulatory Visit: Payer: BC Managed Care – PPO | Attending: Internal Medicine

## 2019-11-01 ENCOUNTER — Ambulatory Visit: Payer: Medicare Other | Admitting: Family Medicine

## 2019-11-01 DIAGNOSIS — Z23 Encounter for immunization: Secondary | ICD-10-CM | POA: Insufficient documentation

## 2019-11-01 NOTE — Progress Notes (Signed)
   Covid-19 Vaccination Clinic  Name:  [name]    MRN: [id number] DOB: 1940-09-10  11/01/2019  [name] was observed post Covid-19 immunization for 15 minutes without incidence. She was provided with Vaccine Information Sheet and instruction to access the V-Safe system.   [name] was instructed to call 911 with any severe reactions post vaccine: Marland Kitchen Difficulty breathing  . Swelling of your face and throat  . A fast heartbeat  . A bad rash all over your body  . Dizziness and weakness    Immunizations Administered    Name Date Dose VIS Date Route   Moderna COVID-19 Vaccine 11/01/2019  4:44 PM 0.5 mL 08/09/2019 Intramuscular   Manufacturer: Moderna   Lot: [id number]   [place]:[id number]

## 2019-11-08 ENCOUNTER — Other Ambulatory Visit: Payer: Self-pay | Admitting: Family Medicine

## 2019-11-08 DIAGNOSIS — I480 Paroxysmal atrial fibrillation: Secondary | ICD-10-CM

## 2019-11-08 DIAGNOSIS — I10 Essential (primary) hypertension: Secondary | ICD-10-CM

## 2019-11-08 DIAGNOSIS — I5042 Chronic combined systolic (congestive) and diastolic (congestive) heart failure: Secondary | ICD-10-CM

## 2019-11-08 NOTE — Telephone Encounter (Signed)
Approved per protocol.  

## 2019-11-10 DIAGNOSIS — I480 Paroxysmal atrial fibrillation: Secondary | ICD-10-CM | POA: Diagnosis not present

## 2019-11-16 ENCOUNTER — Ambulatory Visit (INDEPENDENT_AMBULATORY_CARE_PROVIDER_SITE_OTHER): Payer: Medicare Other | Admitting: Family Medicine

## 2019-11-16 ENCOUNTER — Encounter: Payer: Self-pay | Admitting: Family Medicine

## 2019-11-16 ENCOUNTER — Other Ambulatory Visit: Payer: Self-pay

## 2019-11-16 VITALS — BP 126/80 | HR 76 | Temp 97.3°F | Resp 14 | Ht 63.0 in | Wt 178.2 lb

## 2019-11-16 DIAGNOSIS — F325 Major depressive disorder, single episode, in full remission: Secondary | ICD-10-CM

## 2019-11-16 DIAGNOSIS — E8881 Metabolic syndrome: Secondary | ICD-10-CM | POA: Diagnosis not present

## 2019-11-16 DIAGNOSIS — I10 Essential (primary) hypertension: Secondary | ICD-10-CM

## 2019-11-16 DIAGNOSIS — I480 Paroxysmal atrial fibrillation: Secondary | ICD-10-CM

## 2019-11-16 DIAGNOSIS — M109 Gout, unspecified: Secondary | ICD-10-CM | POA: Diagnosis not present

## 2019-11-16 DIAGNOSIS — I5042 Chronic combined systolic (congestive) and diastolic (congestive) heart failure: Secondary | ICD-10-CM

## 2019-11-16 DIAGNOSIS — E559 Vitamin D deficiency, unspecified: Secondary | ICD-10-CM | POA: Diagnosis not present

## 2019-11-16 DIAGNOSIS — J410 Simple chronic bronchitis: Secondary | ICD-10-CM | POA: Diagnosis not present

## 2019-11-16 DIAGNOSIS — E785 Hyperlipidemia, unspecified: Secondary | ICD-10-CM | POA: Diagnosis not present

## 2019-11-16 DIAGNOSIS — R739 Hyperglycemia, unspecified: Secondary | ICD-10-CM

## 2019-11-16 MED ORDER — ALLOPURINOL 300 MG PO TABS: 300.0000 mg | ORAL_TABLET | Freq: Every day | ORAL | 5 refills | Status: DC

## 2019-11-16 MED ORDER — TORSEMIDE 20 MG PO TABS: 20.0000 mg | ORAL_TABLET | Freq: Every day | ORAL | 2 refills | Status: DC

## 2019-11-16 NOTE — Progress Notes (Signed)
Name: [name]   MRN: [id number]    DOB: 25-Apr-1941   Date:11/16/2019       Progress Note  Subjective  Chief Complaint  Chief Complaint  Patient presents with  . Medication Refill  . Hyperlipidemia  . Hypertension  . Insomnia  . Gout  . Chronic combined CHF  . Atrial Fibrillation    HPI  Chronic combined CHF: she is on Demadex and potassium -last potassium was low we will recheck labs,offdigoxinand beta-blocker . No chest , no SOB at rest, but has some with moderate activity , she denies orthopnea, no lower extremity edema. She is not on ACE because of cough, she was on Losartan for a period of time but bp dropped so cardiologist discontinued medications. She has regular follow up with Dr. Clayborn Bigness.  HTN: taking metoprolol, bp much better controlled since aortic valve replacement. No chest pain she has mild SOB with activity but seems to have improved since she lost weight. BP is at goal   Hyperlipidemia: she stopped taking Atorvastatin but is taking Zetia  and we will recheck labs - she will return fasting . She states she is intolerant to statin therapy   Insomnia:shetakes Trazodone prnusually just half a pill, most nights she sleeps without medication.She has been sleeping well without medication over the past few weeks.   Gout:no recent episodes, taking allopurinol daily.Unchanged   Dysmetabolic Syndrome:  She denies polyphagia, polyuria or polydipsiaLast hgbA1C was 5.7%, she is on a keto diet since 09/09/2019 and lost 8 lbs  , continue the hard work, doing well   Afib: rate controlled, no fluttering sensation, taking betablocker andoffdigoxin, also on coumadin and goes to coumadin clinic/Dr. Callwood,  Doing well, last week INR was at goal   Chronic bronchitis : She states she has a productive cough in am's, SOB only intermittently with moderate activity .Quit smoking in 2010.   Depression:shehas a long history of depression, used to take  SSRI's, symptoms now are intermittent and has been in remission for months, only symptoms is difficulty sleeping and takes trazodone prn only, occasionally only . Shedenies suicidal thoughts or ideation.She is motivated, working part timeand is feeling well, proud of being independent   Pain on both knees: she has a long history of knee pain, she is doing well now, lost weight, had PT and is stronger no longer having pain  Patient Active Problem List   Diagnosis Date Noted  . On Coumadin for atrial fibrillation ([place]) 06/30/2018  . Chronic anticoagulation 08/27/2017  . History of right breast cancer 08/27/2017  . Chronic obstructive pulmonary disease ([place]) 07/04/2015  . Controlled gout 06/06/2015  . Anxiety 02/09/2015  . Synovial cyst of popliteal space 02/09/2015  . Benign hypertension 02/09/2015  . Blood type A+ 02/09/2015  . Arterial branch occlusion of retina 02/09/2015  . Chronic constipation 02/09/2015  . Insomnia, persistent 02/09/2015  . Chronic combined systolic and diastolic CHF, NYHA class 1 ([place]) 02/09/2015  . Dyslipidemia 02/09/2015  . Atrial fibrillation ([place]) 02/09/2015  . Arthritis urica 02/09/2015  . Hearing loss 02/09/2015  . History of open heart surgery 02/09/2015  . Chronic hoarseness 02/09/2015  . Cardiomegaly 02/09/2015  . Dysmetabolic syndrome 0000000  . Migraine with aura and without status migrainosus, not intractable 02/09/2015  . Adult BMI 30+ 02/09/2015  . Hypertensive pulmonary vascular disease ([place]) 02/09/2015  . Allergic rhinitis, seasonal 02/09/2015  . Vitamin D deficiency 02/09/2015  . History of mitral valve repair 10/01/2012  . History of aortic  valve replacement 10/01/2012  . Congestive heart failure ([place]) 10/01/2012    Past Surgical History:  Procedure Laterality Date  . ABDOMINAL HYSTERECTOMY  1996  . arm surgery  2019  . BREAST BIOPSY Right 1998   neg  . BREAST BIOPSY Right 2007   neg  . BREAST BIOPSY Right 1992   positive   . BREAST EXCISIONAL BIOPSY Left 2000   neg  . BREAST LUMPECTOMY Right 1992  . CARDIAC VALVE REPLACEMENT  2014  . CATARACT EXTRACTION W/PHACO Left 03/30/2018   Procedure: CATARACT EXTRACTION PHACO AND INTRAOCULAR LENS PLACEMENT (IOC);  Surgeon: Birder Robson, MD;  Location: [facility] ORS;  Service: Ophthalmology;  Laterality: Left;  [place] 00:31.9 AP% 12.1 CDE 3.87 Fluid Pack lot # [id number]  . FRACTURE SURGERY Left    ARM/ LEG  . LEG SURGERY      Family History  Problem Relation Age of Onset  . Cancer Mother   . Diabetes Mother   . Hypertension Mother   . Breast cancer Mother 52  . Hypertension Father   . Heart failure Father   . Cancer Brother        bladder  . Cancer Sister        breast  . Breast cancer Sister 50  . Hypertension Son   . Hypertension Daughter   . Breast cancer Sister 61    Social History   Tobacco Use  . Smoking status: Former Smoker    Quit date: 09/09/2007    Years since quitting: 12.1  . Smokeless tobacco: Never Used  Substance Use Topics  . Alcohol use: No    Alcohol/week: 0.0 standard drinks     Current Outpatient Medications:  .  acetaminophen (TYLENOL) 500 MG tablet, Take 1,000-1,500 mg by mouth every 6 (six) hours as needed for mild pain., Disp: , Rfl:  .  allopurinol (ZYLOPRIM) 300 MG tablet, Take 1 tablet (300 mg total) by mouth daily., Disp: 30 tablet, Rfl: 5 .  aspirin 81 MG EC tablet, Take by mouth., Disp: , Rfl:  .  clindamycin (CLEOCIN) 300 MG capsule, TAKE 600 MG (2 TABLETS)  1 HOUR PRIOR TO DENTAL PROCEDURE, Disp: , Rfl:  .  diclofenac sodium (VOLTAREN) 1 % GEL, Apply 4 g topically 4 (four) times daily., Disp: 100 g, Rfl: 2 .  Ergocalciferol (VITAMIN D2) 50 MCG (2000 UT) TABS, Take 2,000 Units by mouth daily., Disp: , Rfl:  .  ezetimibe (ZETIA) 10 MG tablet, Take 1 tablet (10 mg total) by mouth daily., Disp: 90 tablet, Rfl: 3 .  fluticasone (FLONASE) 50 MCG/ACT nasal spray, Place 2 sprays into both nostrils daily., Disp: 16 g, Rfl:  6 .  metoprolol tartrate (LOPRESSOR) 25 MG tablet, TAKE 1 TABLET BY MOUTH TWICE A DAY, Disp: 180 tablet, Rfl: 0 .  potassium chloride SA (KLOR-CON M20) 20 MEQ tablet, Take 1 tablet (20 mEq total) by mouth daily., Disp: 90 tablet, Rfl: 1 .  tiotropium (SPIRIVA HANDIHALER) 18 MCG inhalation capsule, INHALE 1 CASPULE VIA HANDIHALER ONCE A DAY AT THE SAME TIME EVERY DAY, Disp: 30 capsule, Rfl: 1 .  torsemide (DEMADEX) 20 MG tablet, Take 1 tablet (20 mg total) by mouth daily., Disp: 30 tablet, Rfl: 2 .  traZODone (DESYREL) 50 MG tablet, Take 1 tablet (50 mg total) by mouth at bedtime., Disp: 90 tablet, Rfl: 0 .  warfarin (COUMADIN) 5 MG tablet, Take 5 mg by mouth daily., Disp: , Rfl:   Allergies  Allergen Reactions  . Ace Inhibitors Cough  and Other (See Comments)  . Amoxicillin-Pot Clavulanate Nausea And Vomiting  . Codeine Nausea And Vomiting  . Penicillins Nausea And Vomiting and Other (See Comments)    Has patient had a PCN reaction causing immediate rash, facial/tongue/throat swelling, SOB or lightheadedness with hypotension: No Has patient had a PCN reaction causing severe rash involving mucus membranes or skin necrosis: No Has patient had a PCN reaction that required hospitalization No Has patient had a PCN reaction occurring within the last 10 years: No If all of the above answers are "NO", then may proceed with Cephalosporin use.  . Rosuvastatin Other (See Comments)    Reaction:  Joint pain   . Tetanus-Diphtheria Toxoids Td Swelling    I personally reviewed active problem list, medication list, allergies, family history, social history, health maintenance with the patient/caregiver today.   ROS  Constitutional: Negative for fever, positive for  weight change.  Respiratory:Postiive for intermittent  cough and shortness of breath.   Cardiovascular: Negative for chest pain or palpitations.  Gastrointestinal: Negative for abdominal pain, no bowel changes.  Musculoskeletal: Negative  for gait problem or joint swelling.  Skin: Negative for rash.  Neurological: Negative for dizziness or headache.  No other specific complaints in a complete review of systems (except as listed in HPI above).  Objective  Vitals:   11/16/19 1354  BP: 126/80  Pulse: 76  Resp: 14  Temp: (!) 97.3 F (36.3 C)  TempSrc: Temporal  SpO2: 96%  Weight: 178 lb 3.2 oz (80.8 kg)  Height: 5\' 3"  (1.6 m)    Body mass index is 31.57 kg/m.  Physical Exam  Constitutional: Patient appears well-developed and well-nourished. Obese  No distress.  HEENT: head atraumatic, normocephalic, pupils equal and reactive to light Cardiovascular: Normal rate, regular rhythm and normal heart sounds.  2 plus holosystolic murmur heard. No BLE edema. Pulmonary/Chest: Effort normal and breath sounds normal. No respiratory distress. Abdominal: Soft.  There is no tenderness. Psychiatric: Patient has a normal mood and affect. behavior is normal. Judgment and thought content normal.  Recent Results (from the past 2160 hour(s))  COMPLETE METABOLIC PANEL WITH GFR     Status: Abnormal   Collection Time: 09/19/19  8:37 AM  Result Value Ref Range   Glucose, Bld 105 (H) 65 - 99 mg/dL    Comment: .            Fasting reference interval . For someone without known diabetes, a glucose value between 100 and 125 mg/dL is consistent with prediabetes and should be confirmed with a follow-up test. .    BUN 20 7 - 25 mg/dL   Creat 0.84 0.60 - 0.93 mg/dL    Comment: For patients >96 years of age, the reference limit for Creatinine is approximately 13% higher for people identified as African-American. .    GFR, Est Non African American 68 > OR = 60 mL/min/1.50m2   GFR, Est African American 78 > OR = 60 mL/min/1.60m2   BUN/Creatinine Ratio NOT APPLICABLE 6 - 22 (calc)   Sodium 142 135 - 146 mmol/L   Potassium 3.3 (L) 3.5 - 5.3 mmol/L   Chloride 104 98 - 110 mmol/L   CO2 31 20 - 32 mmol/L   Calcium 9.4 8.6 - 10.4 mg/dL    Total Protein 6.9 6.1 - 8.1 g/dL   Albumin 4.1 3.6 - 5.1 g/dL   Globulin 2.8 1.9 - 3.7 g/dL (calc)   AG Ratio 1.5 1.0 - 2.5 (calc)   Total Bilirubin 0.5  0.2 - 1.2 mg/dL   Alkaline phosphatase (APISO) 66 37 - 153 U/L   AST 22 10 - 35 U/L   ALT 20 6 - 29 U/L  CBC with Differential/Platelet     Status: None   Collection Time: 09/19/19  8:37 AM  Result Value Ref Range   WBC 7.0 3.8 - 10.8 Thousand/uL   RBC 4.56 3.80 - 5.10 Million/uL   Hemoglobin 13.1 11.7 - 15.5 g/dL   HCT 40.3 35.0 - 45.0 %   MCV 88.4 80.0 - 100.0 fL   MCH 28.7 27.0 - 33.0 pg   MCHC 32.5 32.0 - 36.0 g/dL   RDW 13.7 11.0 - 15.0 %   Platelets 237 140 - 400 Thousand/uL   MPV 11.3 7.5 - 12.5 fL   Neutro Abs 2,548 1,500 - 7,800 cells/uL   Lymphs Abs 3,794 850 - 3,900 cells/uL   Absolute Monocytes 518 200 - 950 cells/uL   Eosinophils Absolute 112 15 - 500 cells/uL   Basophils Absolute 28 0 - 200 cells/uL   Neutrophils Relative % 36.4 %   Total Lymphocyte 54.2 %   Monocytes Relative 7.4 %   Eosinophils Relative 1.6 %   Basophils Relative 0.4 %  Hemoglobin A1c     Status: Abnormal   Collection Time: 09/19/19  8:37 AM  Result Value Ref Range   Hgb A1c MFr Bld 5.7 (H) <5.7 % of total Hgb    Comment: For someone without known diabetes, a hemoglobin  A1c value between 5.7% and 6.4% is consistent with prediabetes and should be confirmed with a  follow-up test. . For someone with known diabetes, a value <7% indicates that their diabetes is well controlled. A1c targets should be individualized based on duration of diabetes, age, comorbid conditions, and other considerations. . This assay result is consistent with an increased risk of diabetes. . Currently, no consensus exists regarding use of hemoglobin A1c for diagnosis of diabetes for children. .    Mean Plasma Glucose 117 (calc)   eAG (mmol/L) 6.5 (calc)     PHQ2/9: Depression screen [facility] 2/9 11/16/2019 09/19/2019 07/27/2019 06/22/2019 06/14/2019   Decreased Interest 0 0 0 0 0  Down, Depressed, Hopeless 0 0 0 0 0  PHQ - 2 Score 0 0 0 0 0  Altered sleeping 0 0 0 0 0  Tired, decreased energy 0 0 0 0 0  Change in appetite 0 0 0 0 0  Feeling bad or failure about yourself  0 0 0 0 0  Trouble concentrating 0 0 0 0 0  Moving slowly or fidgety/restless 0 0 0 0 0  Suicidal thoughts 0 0 0 0 0  PHQ-9 Score 0 0 0 0 0  Difficult doing work/chores Not difficult at all Not difficult at all Not difficult at all Not difficult at all -  Some recent data might be hidden    phq 9 is negative   Fall Risk: Fall Risk  11/16/2019 09/19/2019 07/27/2019 06/22/2019 05/06/2019  Falls in the past year? 0 0 0 0 0  Number falls in past yr: 0 0 0 0 0  Injury with Fall? 0 0 0 0 0  Comment - - - - -  Risk for fall due to : - - - - -  Follow up - - - - -     Functional Status Survey: Is the patient deaf or have difficulty hearing?: No Does the patient have difficulty seeing, even when wearing glasses/contacts?: No Does the patient  have difficulty concentrating, remembering, or making decisions?: No Does the patient have difficulty walking or climbing stairs?: No Does the patient have difficulty dressing or bathing?: No Does the patient have difficulty doing errands alone such as visiting a doctor's office or shopping?: No    Assessment & Plan  1. Chronic combined systolic and diastolic CHF, NYHA class 1 ([place])  Keep follow up with cardiologist   2. Hyperglycemia  On keto diet now   3. Dyslipidemia  - Lipid panel  4. Major depression in remission [facility])  Doing well   5. Benign hypertension  - COMPLETE METABOLIC PANEL WITH GFR  6. Simple chronic bronchitis (H[facility])  Stable   7. Paroxysmal atrial fibrillation ([facility])  Sinus rhythm at this time  On coumadin, sees Dr. Clayborn Bigness   8. Vitamin D deficiency  - VITAMIN D 25 Hydroxy (Vit-D Deficiency, Fractures)

## 2019-11-17 DIAGNOSIS — I10 Essential (primary) hypertension: Secondary | ICD-10-CM | POA: Diagnosis not present

## 2019-11-17 DIAGNOSIS — E785 Hyperlipidemia, unspecified: Secondary | ICD-10-CM | POA: Diagnosis not present

## 2019-11-17 DIAGNOSIS — E559 Vitamin D deficiency, unspecified: Secondary | ICD-10-CM | POA: Diagnosis not present

## 2019-11-17 LAB — COMPLETE METABOLIC PANEL WITH GFR
AG Ratio: 1.4 (calc) (ref 1.0–2.5)
ALT: 21 U/L (ref 6–29)
AST: 24 U/L (ref 10–35)
Albumin: 4.1 g/dL (ref 3.6–5.1)
Alkaline phosphatase (APISO): 64 U/L (ref 37–153)
BUN/Creatinine Ratio: 33 (calc) — ABNORMAL HIGH (ref 6–22)
BUN: 26 mg/dL — ABNORMAL HIGH (ref 7–25)
CO2: 32 mmol/L (ref 20–32)
Calcium: 9.7 mg/dL (ref 8.6–10.4)
Chloride: 102 mmol/L (ref 98–110)
Creat: 0.79 mg/dL (ref 0.60–0.93)
GFR, Est African American: 84 mL/min/{1.73_m2} (ref >=60)
GFR, Est Non African American: 73 mL/min/{1.73_m2} (ref >=60)
Globulin: 2.9 g/dL (calc) (ref 1.9–3.7)
Glucose, Bld: 112 mg/dL — ABNORMAL HIGH (ref 65–99)
Potassium: 3 mmol/L — ABNORMAL LOW (ref 3.5–5.3)
Sodium: 143 mmol/L (ref 135–146)
Total Bilirubin: 0.6 mg/dL (ref 0.2–1.2)
Total Protein: 7 g/dL (ref 6.1–8.1)

## 2019-11-17 LAB — LIPID PANEL
Cholesterol: 237 mg/dL — ABNORMAL HIGH (ref <200)
HDL: 46 mg/dL — ABNORMAL LOW (ref >=50)
LDL Cholesterol (Calc): 164 mg/dL (calc) — ABNORMAL HIGH
Non-HDL Cholesterol (Calc): 191 mg/dL (calc) — ABNORMAL HIGH (ref <130)
Total CHOL/HDL Ratio: 5.2 (calc) — ABNORMAL HIGH (ref <5.0)
Triglycerides: 130 mg/dL (ref <150)

## 2019-11-17 LAB — VITAMIN D 25 HYDROXY (VIT D DEFICIENCY, FRACTURES): Vit D, 25-Hydroxy: 31 ng/mL (ref 30–100)

## 2019-12-05 ENCOUNTER — Other Ambulatory Visit: Payer: Self-pay | Admitting: Family Medicine

## 2019-12-05 DIAGNOSIS — I5042 Chronic combined systolic (congestive) and diastolic (congestive) heart failure: Secondary | ICD-10-CM

## 2019-12-05 DIAGNOSIS — I10 Essential (primary) hypertension: Secondary | ICD-10-CM

## 2019-12-13 DIAGNOSIS — I480 Paroxysmal atrial fibrillation: Secondary | ICD-10-CM | POA: Diagnosis not present

## 2019-12-23 DIAGNOSIS — H26492 Other secondary cataract, left eye: Secondary | ICD-10-CM | POA: Diagnosis not present

## 2019-12-26 ENCOUNTER — Other Ambulatory Visit: Payer: Self-pay | Admitting: Family Medicine

## 2019-12-26 ENCOUNTER — Telehealth: Payer: Self-pay | Admitting: Family Medicine

## 2019-12-26 MED ORDER — COLCHICINE 0.6 MG PO TABS: 0.6000 mg | ORAL_TABLET | Freq: Every day | ORAL | 0 refills | Status: DC

## 2019-12-26 NOTE — Telephone Encounter (Signed)
Copied from [place] [id number]. Topic: General - Other >> Dec 26, 2019  8:28 AM Keene Breath wrote: Reason for CRM: Patient called and asked that the doctor send her in some medication for the gout in her right ankle.  She stated she has been up all night in pain and really needs this sent to the pharmacy asap.  Please advise and call if there is a problem at [phone]

## 2019-12-26 NOTE — Telephone Encounter (Signed)
She is taking Allopurinol. She has been on Keto diet and thinks that it may have flared up her gout. She needs her [place].  [facility] [address] [facility].

## 2019-12-28 ENCOUNTER — Telehealth: Payer: Self-pay | Admitting: Family Medicine

## 2019-12-28 NOTE — Telephone Encounter (Signed)
Pt was not able to go to work on Monday due to her gout flare up. Stated that she was not able to pick up her refill until late Monday night. She is thinking that she will be returning to work on Monday due to her foot still being swollen, very sore and painful. Pt is asking for a note for work

## 2019-12-29 ENCOUNTER — Telehealth: Payer: Self-pay | Admitting: Family Medicine

## 2019-12-29 NOTE — Telephone Encounter (Signed)
Patient called.  Patient aware.  

## 2019-12-29 NOTE — Telephone Encounter (Signed)
Patient declined the visit due to she was in pain with gout.  Call next month

## 2020-01-03 ENCOUNTER — Telehealth: Payer: Self-pay | Admitting: Family Medicine

## 2020-01-03 DIAGNOSIS — I480 Paroxysmal atrial fibrillation: Secondary | ICD-10-CM | POA: Diagnosis not present

## 2020-01-03 NOTE — Chronic Care Management (AMB) (Signed)
  Care Management   Note  01/03/2020 Name: [name] MRN: [id number] DOB: 06/09/1941  [name] is a 79 y.o. year old female who is a primary care patient of [name], MD and is actively engaged with the care management team. I reached out to [name] by phone today to assist with scheduling a follow up visit with the RN Case Manager  Follow up plan: Unsuccessful telephone outreach attempt made. A HIPPA compliant phone message was left for the patient providing contact information and requesting a return call.  The care management team will reach out to the patient again over the next 7 days.  If patient returns call to provider office, please advise to call [facility]  at [place], [place], [place], [place] [address] Direct Dial: [phone] [email or web address] Website: [email or web address]

## 2020-01-05 ENCOUNTER — Other Ambulatory Visit: Payer: Self-pay

## 2020-01-05 ENCOUNTER — Ambulatory Visit (INDEPENDENT_AMBULATORY_CARE_PROVIDER_SITE_OTHER): Payer: Medicare Other | Admitting: Internal Medicine

## 2020-01-05 ENCOUNTER — Encounter: Payer: Self-pay | Admitting: Internal Medicine

## 2020-01-05 VITALS — BP 114/70 | HR 62 | Temp 97.9°F | Resp 16 | Ht 63.0 in | Wt 177.5 lb

## 2020-01-05 DIAGNOSIS — M109 Gout, unspecified: Secondary | ICD-10-CM

## 2020-01-05 NOTE — Patient Instructions (Signed)
dietary ways to control gout flares: - Avoid: All organ meats: These include liver, kidneys, sweetbreads and brain Game meats: Examples include pheasant, veal and venison Fish: Herring, trout, mackerel, tuna, sardines, anchovies, haddock and more Other seafood: Scallops, crab, shrimp and roe Sugary beverages: Especially fruit juices and sugary sodas Added sugars: Honey, agave nectar and high-fructose corn syrup Yeasts: Nutritional yeast, brewer's yeast and other yeast supplements - Eat: Fruits: All fruits are generally fine for gout. Cherries may even help prevent attacks by lowering uric acid levels and reducing inflammation (23, 24). Vegetables: All vegetables are fine, including potatoes, peas, mushrooms, eggplants and dark green leafy vegetables. Legumes: All legumes are fine, including lentils, beans, soybeans and tofu. Nuts: All nuts and seeds. Whole grains: These include oats, brown rice and barley. Dairy products: All dairy is safe, but low-fat dairy appears to be especially beneficial (11, 18). Eggs Beverages: Coffee, tea and green tea. Herbs and spices: All herbs and spices. Plant-based oils: Including canola, coconut, olive and flax oils.    Gout  Gout is painful swelling of your joints. Gout is a type of arthritis. It is caused by having too much uric acid in your body. Uric acid is a chemical that is made when your body breaks down substances called purines. If your body has too much uric acid, sharp crystals can form and build up in your joints. This causes pain and swelling. Gout attacks can happen quickly and be very painful (acute gout). Over time, the attacks can affect more joints and happen more often (chronic gout). What are the causes?  Too much uric acid in your blood. This can happen because: ? Your kidneys do not remove enough uric acid from your blood. ? Your body makes too much uric acid. ? You eat too many foods that are high in purines. These foods include organ meats,  some seafood, and beer.  Trauma or stress. What increases the risk?  Having a family history of gout.  Being female and middle-aged.  Being female and having gone through menopause.  Being very overweight (obese).  Drinking alcohol, especially beer.  Not having enough water in the body (being dehydrated).  Losing weight too quickly.  Having an organ transplant.  Having lead poisoning.  Taking certain medicines.  Having kidney disease.  Having a skin condition called psoriasis. What are the signs or symptoms? An attack of acute gout usually happens in just one joint. The most common place is the big toe. Attacks often start at night. Other joints that may be affected include joints of the feet, ankle, knee, fingers, wrist, or elbow. Symptoms of an attack may include:  Very bad pain.  Warmth.  Swelling.  Stiffness.  Shiny, red, or purple skin.  Tenderness. The affected joint may be very painful to touch.  Chills and fever. Chronic gout may cause symptoms more often. More joints may be involved. You may also have white or yellow lumps (tophi) on your hands or feet or in other areas near your joints. How is this treated?  Treatment for this condition has two phases: treating an acute attack and preventing future attacks.  Acute gout treatment may include: ? NSAIDs. ? Steroids. These are taken by mouth or injected into a joint. ? Colchicine. This medicine relieves pain and swelling. It can be given by mouth or through an IV tube.  Preventive treatment may include: ? Taking small doses of NSAIDs or colchicine daily. ? Using a medicine that reduces uric acid  levels in your blood. ? Making changes to your diet. You may need to see a food expert (dietitian) about what to eat and drink to prevent gout. Follow these instructions at home: During a gout attack   If told, put ice on the painful area: ? Put ice in a plastic bag. ? Place a towel between your skin and the  bag. ? Leave the ice on for 20 minutes, 2-3 times a day.  Raise (elevate) the painful joint above the level of your heart as often as you can.  Rest the joint as much as possible. If the joint is in your leg, you may be given crutches.  Follow instructions from your doctor about what you cannot eat or drink. Avoiding future gout attacks  Eat a low-purine diet. Avoid foods and drinks such as: ? Liver. ? Kidney. ? Anchovies. ? Asparagus. ? Herring. ? Mushrooms. ? Mussels. ? Beer.  Stay at a healthy weight. If you want to lose weight, talk with your doctor. Do not lose weight too fast.  Start or continue an exercise plan as told by your doctor. Eating and drinking  Drink enough fluids to keep your pee (urine) pale yellow.  If you drink alcohol: ? Limit how much you use to:  0-1 drink a day for women.  0-2 drinks a day for men. ? Be aware of how much alcohol is in your drink. In the [place] one drink equals one 12 oz bottle of beer (355 mL), one 5 oz glass of wine (148 mL), or one 1 oz glass of hard liquor (44 mL). General instructions  Take over-the-counter and prescription medicines only as told by your doctor.  Do not drive or use heavy machinery while taking prescription pain medicine.  Return to your normal activities as told by your doctor. Ask your doctor what activities are safe for you.  Keep all follow-up visits as told by your doctor. This is important. Contact a doctor if:  You have another gout attack.  You still have symptoms of a gout attack after 10 days of treatment.  You have problems (side effects) because of your medicines.  You have chills or a fever.  You have burning pain when you pee (urinate).  You have pain in your lower back or belly. Get help right away if:  You have very bad pain.  Your pain cannot be controlled.  You cannot pee. Summary  Gout is painful swelling of the joints.  The most common site of pain is the big toe, but  it can affect other joints.  Medicines and avoiding some foods can help to prevent and treat gout attacks. This information is not intended to replace advice given to you by your health care provider. Make sure you discuss any questions you have with your health care provider. Document Revised: 03/17/2018 Document Reviewed: 03/17/2018 Elsevier Patient Education  [place].

## 2020-01-05 NOTE — Progress Notes (Signed)
Patient ID: [name], female    DOB: 1941/05/14, 79 y.o.   MRN: [id number]  PCP: [name], MD  Chief Complaint  Patient presents with  . Gout    onset 2 weeks ago    Subjective:   [name] is a 79 y.o. female, presents to clinic with CC of the following:  Chief Complaint  Patient presents with  . Gout    onset 2 weeks ago    HPI:  Patient is a 79 year old female patient of Dr. Ancil Boozer She has a history of CHF, hypertension, hyperlipidemia, dysmetabolic syndrome, gout, A. fib, depression, and insomnia noted  She noted that on Friday the 19th, she started to get some ankle pain, and it continued over the weekend.  It got more red and slightly swollen and she took Tylenol.  It was at its worst on Monday, and she called the office with Dr. Ancil Boozer calling in a prescription of colchicine for her to start.  She took several tablets a day to help, and over the next 2 to 3 days it did improve some.  She was able to return to work in [employer] where she is often on her feet a lot this Monday, and notes that the discomfort moved down from her ankle into her foot, and now feels it more at the base of her foot and a little into her great toe and lesser the other toes.  She describes it more as a soreness and stiffness now, not like the bad pain she had when this first started, she has 1 pill of the colchicine left, and has not been using that in the last day or 2.  She was told not to take NSAIDs after her prior heart surgery.  She takes Tylenol frequently to help.  She did try to soak it some in the last couple days.  It is definitely a lot better, although has not returned back to normal.  She does take allopurinol daily to try to prevent errors from occurring.  She denies any alcohol use   Patient Active Problem List   Diagnosis Date Noted  . Morbid obesity ([place]) 01/05/2020  . On Coumadin for atrial fibrillation ([place]) 06/30/2018  . Chronic anticoagulation  08/27/2017  . History of right breast cancer 08/27/2017  . Chronic obstructive pulmonary disease ([place]) 07/04/2015  . Controlled gout 06/06/2015  . Anxiety 02/09/2015  . Synovial cyst of popliteal space 02/09/2015  . Benign hypertension 02/09/2015  . Blood type A+ 02/09/2015  . Arterial branch occlusion of retina 02/09/2015  . Chronic constipation 02/09/2015  . Insomnia, persistent 02/09/2015  . Chronic combined systolic and diastolic CHF, NYHA class 1 ([place]) 02/09/2015  . Dyslipidemia 02/09/2015  . Atrial fibrillation ([place]) 02/09/2015  . Arthritis urica 02/09/2015  . Hearing loss 02/09/2015  . History of open heart surgery 02/09/2015  . Chronic hoarseness 02/09/2015  . Cardiomegaly 02/09/2015  . Dysmetabolic syndrome 0000000  . Migraine with aura and without status migrainosus, not intractable 02/09/2015  . Hypertensive pulmonary vascular disease ([place]) 02/09/2015  . Allergic rhinitis, seasonal 02/09/2015  . Vitamin D deficiency 02/09/2015  . History of mitral valve repair 10/01/2012  . History of aortic valve replacement 10/01/2012  . Congestive heart failure ([place]) 10/01/2012      Current Outpatient Medications:  .  acetaminophen (TYLENOL) 500 MG tablet, Take 1,000-1,500 mg by mouth every 6 (six) hours as needed for mild pain., Disp: , Rfl:  .  allopurinol (ZYLOPRIM)  300 MG tablet, Take 1 tablet (300 mg total) by mouth daily., Disp: 30 tablet, Rfl: 5 .  aspirin 81 MG EC tablet, Take by mouth., Disp: , Rfl:  .  clindamycin (CLEOCIN) 300 MG capsule, TAKE 600 MG (2 TABLETS)  1 HOUR PRIOR TO DENTAL PROCEDURE, Disp: , Rfl:  .  colchicine 0.6 MG tablet, Take 1-2 tablets (0.6-1.2 mg total) by mouth daily. And repeat in one hour for gout attacks prn, Disp: 30 tablet, Rfl: 0 .  Ergocalciferol (VITAMIN D2) 50 MCG (2000 UT) TABS, Take 2,000 Units by mouth daily., Disp: , Rfl:  .  ezetimibe (ZETIA) 10 MG tablet, Take 1 tablet (10 mg total) by mouth daily., Disp: 90 tablet, Rfl: 3 .   fluticasone (FLONASE) 50 MCG/ACT nasal spray, Place 2 sprays into both nostrils daily., Disp: 16 g, Rfl: 6 .  metoprolol tartrate (LOPRESSOR) 25 MG tablet, TAKE 1 TABLET BY MOUTH TWICE A DAY, Disp: 180 tablet, Rfl: 0 .  potassium chloride SA (KLOR-CON M20) 20 MEQ tablet, Take 1 tablet (20 mEq total) by mouth daily., Disp: 90 tablet, Rfl: 1 .  tiotropium (SPIRIVA HANDIHALER) 18 MCG inhalation capsule, INHALE 1 CASPULE VIA HANDIHALER ONCE A DAY AT THE SAME TIME EVERY DAY, Disp: 30 capsule, Rfl: 1 .  torsemide (DEMADEX) 20 MG tablet, TAKE 1 TABLET BY MOUTH EVERY DAY, Disp: 30 tablet, Rfl: 1 .  traZODone (DESYREL) 50 MG tablet, Take 1 tablet (50 mg total) by mouth at bedtime., Disp: 90 tablet, Rfl: 0 .  warfarin (COUMADIN) 5 MG tablet, Take 5 mg by mouth daily., Disp: , Rfl:    Allergies  Allergen Reactions  . Ace Inhibitors Cough and Other (See Comments)  . Amoxicillin-Pot Clavulanate Nausea And Vomiting  . Codeine Nausea And Vomiting  . Penicillins Nausea And Vomiting and Other (See Comments)    Has patient had a PCN reaction causing immediate rash, facial/tongue/throat swelling, SOB or lightheadedness with hypotension: No Has patient had a PCN reaction causing severe rash involving mucus membranes or skin necrosis: No Has patient had a PCN reaction that required hospitalization No Has patient had a PCN reaction occurring within the last 10 years: No If all of the above answers are "NO", then may proceed with Cephalosporin use.  . Rosuvastatin Other (See Comments)    Reaction:  Joint pain   . Tetanus-Diphtheria Toxoids Td Swelling     Past Surgical History:  Procedure Laterality Date  . ABDOMINAL HYSTERECTOMY  1996  . arm surgery  2019  . BREAST BIOPSY Right 1998   neg  . BREAST BIOPSY Right 2007   neg  . BREAST BIOPSY Right 1992   positive  . BREAST EXCISIONAL BIOPSY Left 2000   neg  . BREAST LUMPECTOMY Right 1992  . CARDIAC VALVE REPLACEMENT  2014  . CATARACT EXTRACTION  W/PHACO Left 03/30/2018   Procedure: CATARACT EXTRACTION PHACO AND INTRAOCULAR LENS PLACEMENT (IOC);  Surgeon: Birder Robson, MD;  Location: [facility] ORS;  Service: Ophthalmology;  Laterality: Left;  [place] 00:31.9 AP% 12.1 CDE 3.87 Fluid Pack lot # [id number]  . FRACTURE SURGERY Left    ARM/ LEG  . LEG SURGERY       Family History  Problem Relation Age of Onset  . Cancer Mother   . Diabetes Mother   . Hypertension Mother   . Breast cancer Mother 47  . Hypertension Father   . Heart failure Father   . Cancer Brother        bladder  .  Cancer Sister        breast  . Breast cancer Sister 64  . Hypertension Son   . Hypertension Daughter   . Breast cancer Sister 68     Social History   Tobacco Use  . Smoking status: Former Smoker    Quit date: 09/09/2007    Years since quitting: 12.3  . Smokeless tobacco: Never Used  Substance Use Topics  . Alcohol use: No    Alcohol/week: 0.0 standard drinks    With staff assistance, above reviewed with the patient today.  ROS: As per HPI, otherwise no specific complaints on a limited and focused system review   No results found for this or any previous visit (from the past 72 hour(s)).   PHQ2/9: Depression screen [facility] 2/9 01/05/2020 11/16/2019 09/19/2019 07/27/2019 06/22/2019  Decreased Interest 0 0 0 0 0  Down, Depressed, Hopeless 0 0 0 0 0  PHQ - 2 Score 0 0 0 0 0  Altered sleeping 0 0 0 0 0  Tired, decreased energy 0 0 0 0 0  Change in appetite 0 0 0 0 0  Feeling bad or failure about yourself  0 0 0 0 0  Trouble concentrating 0 0 0 0 0  Moving slowly or fidgety/restless 0 0 0 0 0  Suicidal thoughts 0 0 0 0 0  PHQ-9 Score 0 0 0 0 0  Difficult doing work/chores Not difficult at all Not difficult at all Not difficult at all Not difficult at all Not difficult at all  Some recent data might be hidden   PHQ-2/9 Result is neg  Fall Risk: Fall Risk  01/05/2020 11/16/2019 09/19/2019 07/27/2019 06/22/2019  Falls in the past year? 0 0 0 0 0    Number falls in past yr: 0 0 0 0 0  Injury with Fall? 0 0 0 0 0  Comment - - - - -  Risk for fall due to : - - - - -  Follow up - - - - -      Objective:   Vitals:   01/05/20 1348  BP: 114/70  Resp: 16  Temp: 97.9 F (36.6 C)  TempSrc: Temporal  Weight: 177 lb 8 oz (80.5 kg)  Height: 5\' 3"  (1.6 m)    Body mass index is 31.44 kg/m.  Physical Exam   NAD, masked, pleasant HEENT - [place]/AT, sclera anicteric,  Neck - supple, Ext - no LE edema, she was not markedly tender to palpation over the right ankle, with good range of motion of the right ankle and no increased swelling, warmth, or marked erythema.  She was mildly tender to palpation in the distal metatarsal region over the dorsum of the foot more than over the toes themselves, with no marked tenderness palpating the great toe, and no marked increased erythema, warmth or swelling of the big toe noted.  There was a question of some subtle swelling in the dorsum of the foot, with some minimal erythema present, and also some mild erythema on the plantar aspect near the ball of the foot where she feels some soreness.  A bunion was present.  Nontender palpating the bunion Neuro/psychiatric - affect was not flat, appropriate with conversation  Alert and oriented  Did review her last labs with her kidney function okay on check in March 2021.    Assessment & Plan:   1. Acute gout of foot - right Has improved since the onset. Still having some residual soreness, and was more  problematic in her ankle initially, with the soreness that persists now more in the dorsal foot. Recent concerns with the amount of colchicine she had taken, and in the future, recommended taking 1-2 at onset, then can take 1 more that first day, for a total of 1.8 mg, and then just taking it up to twice a day after for a total of 1.2 mg. She is limited as she is unable to take NSAIDs.  A steroid may be another option to take, and at this point do not feel that the  risk-benefit favors that. Can continue the soaks she is doing, and emphasize trying to apply cold topically, and she notes she has a bag of peas or corn in her freezer that she uses to do so, also try to keep elevated when home, and can apply a topical Voltaren gel she has as well to try to help.  Also can take her Tylenol product for pain. Do expect continued improvement over the next few days, and if not or worsening again, she needs to follow-up. Also will continue her allopurinol product  2. Controlled gout Continue the allopurinol  Provided information in the AVS on diet with respect to gout and also on gout in general.  Also reviewed the appropriate dosing for colchicine use in the future to lessen concerns with potential toxicity as noted below Colchicine should be administered in a total dose on day 1 not to exceed 1.8 mg, either taken as 0.6 mg three times on the first day or by taking 1.2 mg for the first dose followed by 0.6 mg an hour later; on subsequent days, colchicine is taken once or twice daily until flare resolution Will follow-up if not improving or more problematic again.   Towanda Malkin, MD 01/05/20 2:00 PM

## 2020-01-06 NOTE — Chronic Care Management (AMB) (Signed)
  Care Management   Note  01/06/2020 Name: [name] MRN: [id number] DOB: April 26, 1941  [name] is a 79 y.o. year old female who is a primary care patient of [name], MD and is actively engaged with the care management team. I reached out to [name] by phone today to assist with scheduling a follow up visit with the RN Case Manager  Follow up plan: Unsuccessful telephone outreach attempt made. A HIPPA compliant phone message was left for the patient providing contact information and requesting a return call.  The care management team will reach out to the patient again over the next 7 days.  If patient returns call to provider office, please advise to call [facility]  at [place], [place], [place], [place] [address] Direct Dial: [phone] [email or web address] Website: [email or web address]

## 2020-01-10 NOTE — Chronic Care Management (AMB) (Signed)
  Care Management   Note  01/10/2020 Name: [name] MRN: [id number] DOB: 1940-10-27  [name] is a 79 y.o. year old female who is a primary care patient of [name], MD and is actively engaged with the care management team. I reached out to [name] by phone today to assist with scheduling a follow up visit with the RN Case Manager  Follow up plan: Unable to make contact on outreach attempts x 3. PCP Dr. Ancil Boozer notified via routed documentation in medical record.   Noreene Larsson, [place], [place], [place] [address] Direct Dial: [phone] [email or web address] Website: [email or web address]

## 2020-01-16 DIAGNOSIS — S86911A Strain of unspecified muscle(s) and tendon(s) at lower leg level, right leg, initial encounter: Secondary | ICD-10-CM | POA: Diagnosis not present

## 2020-01-19 DIAGNOSIS — S86911A Strain of unspecified muscle(s) and tendon(s) at lower leg level, right leg, initial encounter: Secondary | ICD-10-CM | POA: Diagnosis not present

## 2020-02-02 ENCOUNTER — Other Ambulatory Visit: Payer: Self-pay | Admitting: Family Medicine

## 2020-02-02 DIAGNOSIS — G4700 Insomnia, unspecified: Secondary | ICD-10-CM

## 2020-02-02 NOTE — Telephone Encounter (Signed)
Requested Prescriptions  Pending Prescriptions Disp Refills  . traZODone (DESYREL) 50 MG tablet [Pharmacy Med Name: TRAZODONE 50 MG TABLET] 90 tablet 0    Sig: TAKE 1 TABLET BY MOUTH EVERYDAY AT BEDTIME     Psychiatry: Antidepressants - Serotonin Modulator Passed - 02/02/2020  5:01 PM      Passed - Valid encounter within last 6 months    Recent Outpatient Visits          4 weeks ago Acute gout of foot, unspecified cause, unspecified laterality   [facility] Le[facility] Conners D, MD   2 months ago Chronic combined systolic and diastolic CHF, NYHA class 1 [facility])   [facility] [name], MD   4 months ago Lipoma of right upper extremity   [facility] [place], Drue Stager, MD   6 months ago Chronic combined systolic and diastolic congestive heart failure [facility])   [facility] [name], MD   7 months ago Unilateral post-traumatic osteoarthritis, left knee   [facility] [name], MD      Future Appointments            In 2 weeks [name], MD [facility], [facility]

## 2020-02-03 DIAGNOSIS — M13872 Other specified arthritis, left ankle and foot: Secondary | ICD-10-CM | POA: Diagnosis not present

## 2020-02-10 DIAGNOSIS — I480 Paroxysmal atrial fibrillation: Secondary | ICD-10-CM | POA: Diagnosis not present

## 2020-02-20 ENCOUNTER — Ambulatory Visit (INDEPENDENT_AMBULATORY_CARE_PROVIDER_SITE_OTHER): Payer: Medicare Other | Admitting: Family Medicine

## 2020-02-20 ENCOUNTER — Other Ambulatory Visit: Payer: Self-pay

## 2020-02-20 ENCOUNTER — Encounter: Payer: Self-pay | Admitting: Family Medicine

## 2020-02-20 VITALS — BP 120/70 | HR 81 | Temp 97.5°F | Resp 16 | Ht 63.0 in | Wt 181.6 lb

## 2020-02-20 DIAGNOSIS — I480 Paroxysmal atrial fibrillation: Secondary | ICD-10-CM

## 2020-02-20 DIAGNOSIS — E876 Hypokalemia: Secondary | ICD-10-CM

## 2020-02-20 DIAGNOSIS — J410 Simple chronic bronchitis: Secondary | ICD-10-CM | POA: Diagnosis not present

## 2020-02-20 DIAGNOSIS — I10 Essential (primary) hypertension: Secondary | ICD-10-CM | POA: Diagnosis not present

## 2020-02-20 DIAGNOSIS — F325 Major depressive disorder, single episode, in full remission: Secondary | ICD-10-CM | POA: Diagnosis not present

## 2020-02-20 DIAGNOSIS — G4700 Insomnia, unspecified: Secondary | ICD-10-CM

## 2020-02-20 DIAGNOSIS — M109 Gout, unspecified: Secondary | ICD-10-CM | POA: Diagnosis not present

## 2020-02-20 DIAGNOSIS — Z1159 Encounter for screening for other viral diseases: Secondary | ICD-10-CM | POA: Diagnosis not present

## 2020-02-20 DIAGNOSIS — M170 Bilateral primary osteoarthritis of knee: Secondary | ICD-10-CM | POA: Diagnosis not present

## 2020-02-20 DIAGNOSIS — I5042 Chronic combined systolic (congestive) and diastolic (congestive) heart failure: Secondary | ICD-10-CM | POA: Diagnosis not present

## 2020-02-20 MED ORDER — METOPROLOL TARTRATE 25 MG PO TABS: 25.0000 mg | ORAL_TABLET | Freq: Two times a day (BID) | ORAL | 1 refills | Status: DC

## 2020-02-20 MED ORDER — TRAZODONE HCL 50 MG PO TABS: 50.0000 mg | ORAL_TABLET | Freq: Every day | ORAL | 1 refills | Status: DC

## 2020-02-20 MED ORDER — POTASSIUM CHLORIDE CRYS ER 20 MEQ PO TBCR: 20.0000 meq | EXTENDED_RELEASE_TABLET | Freq: Two times a day (BID) | ORAL | 1 refills | Status: DC

## 2020-02-20 MED ORDER — ALLOPURINOL 300 MG PO TABS: 300.0000 mg | ORAL_TABLET | Freq: Every day | ORAL | 1 refills | Status: DC

## 2020-02-20 MED ORDER — SPIRIVA HANDIHALER 18 MCG IN CAPS: ORAL_CAPSULE | RESPIRATORY_TRACT | 1 refills | Status: DC

## 2020-02-20 MED ORDER — TORSEMIDE 20 MG PO TABS: 20.0000 mg | ORAL_TABLET | Freq: Every day | ORAL | 1 refills | Status: DC

## 2020-02-20 NOTE — Progress Notes (Signed)
Name: [name]   MRN: [id number]    DOB: 1940-10-19   Date:02/20/2020       Progress Note  Subjective  Chief Complaint  Chief Complaint  Patient presents with  . Hypertension  . Dyslipidemia  . Atrial Fibrillation    HPI    Chronic combined CHF: she is on Demadex and potassium -last potassium was low we will recheck labs including magnesium this time,offdigoxinand beta-blocker . No chest , no SOB at rest, but has some with moderate activity, she denies orthopnea, no lower extremity edema. She is not on ACE because of cough, she was on Losartan for a period of time but bp dropped so cardiologist discontinued medications. She has regular follow ups with Dr. Clayborn Bigness   HTN: taking metoprolol, bp much better controlled since aortic valve replacement. No chest pain she has mild SOB with activity but seems to have improved since she lost weight. BP is at goaltoday   Hyperlipidemia: she stopped taking Atorvastatin but is taking Zetia, she states her sister takes a medication and will find out the name and let me know She states she is intolerant to statin therapy   Insomnia:shetakes Trazodone prnusually just half a pill, most nights she sleeps without medication.No recent problems   Gout:no recent episodes, taking allopurinol daily She developed left foot pain and thought it was gout, but it went to right foot, seen by podiatrist and told she has OA and is going to start PT soon. At this time no pain, redness or swelling   Dysmetabolic Syndrome:  She denies polyphagia, polyuria or polydipsiaLast hgbA1C was 5.7%, she was on a keto diet, still doing well, weight is stable.   Afib: rate controlled, no fluttering sensation, taking betablocker andoffdigoxin, also on coumadin and goes to coumadin clinic/Dr. Callwood. Denies bleeding   Chronic bronchitis : She states she has a productive cough in am's, SOB only intermittently with moderate activity .Quit smoking in  2010.Needs refill of spiriva  Depression:shehas a long history of depression, used to take SSRI's, symptoms now are intermittent and has been in remission for months, only symptoms is difficulty sleeping and takes trazodone prn only, occasionally only. Shedenies suicidal thoughts or ideation.She is motivated, working part timeand is feeling well. Phq 9 negative   Patient Active Problem List   Diagnosis Date Noted  . Morbid obesity ([place]) 01/05/2020  . On Coumadin for atrial fibrillation ([place]) 06/30/2018  . Chronic anticoagulation 08/27/2017  . History of right breast cancer 08/27/2017  . Chronic obstructive pulmonary disease ([place]) 07/04/2015  . Controlled gout 06/06/2015  . Anxiety 02/09/2015  . Synovial cyst of popliteal space 02/09/2015  . Benign hypertension 02/09/2015  . Blood type A+ 02/09/2015  . Arterial branch occlusion of retina 02/09/2015  . Chronic constipation 02/09/2015  . Insomnia, persistent 02/09/2015  . Chronic combined systolic and diastolic CHF, NYHA class 1 ([place]) 02/09/2015  . Dyslipidemia 02/09/2015  . Atrial fibrillation ([place]) 02/09/2015  . Arthritis urica 02/09/2015  . Hearing loss 02/09/2015  . History of open heart surgery 02/09/2015  . Chronic hoarseness 02/09/2015  . Cardiomegaly 02/09/2015  . Dysmetabolic syndrome 41/96/2229  . Migraine with aura and without status migrainosus, not intractable 02/09/2015  . Hypertensive pulmonary vascular disease ([place]) 02/09/2015  . Allergic rhinitis, seasonal 02/09/2015  . Vitamin D deficiency 02/09/2015  . History of mitral valve repair 10/01/2012  . History of aortic valve replacement 10/01/2012  . Congestive heart failure ([place]) 10/01/2012    Past Surgical History:  Procedure  Laterality Date  . ABDOMINAL HYSTERECTOMY  1996  . arm surgery  2019  . BREAST BIOPSY Right 1998   neg  . BREAST BIOPSY Right 2007   neg  . BREAST BIOPSY Right 1992   positive  . BREAST EXCISIONAL BIOPSY Left 2000   neg  .  BREAST LUMPECTOMY Right 1992  . CARDIAC VALVE REPLACEMENT  2014  . CATARACT EXTRACTION W/PHACO Left 03/30/2018   Procedure: CATARACT EXTRACTION PHACO AND INTRAOCULAR LENS PLACEMENT (IOC);  Surgeon: Birder Robson, MD;  Location: [facility] ORS;  Service: Ophthalmology;  Laterality: Left;  [place] 00:31.9 AP% 12.1 CDE 3.87 Fluid Pack lot # [id number]  . FRACTURE SURGERY Left    ARM/ LEG  . LEG SURGERY      Family History  Problem Relation Age of Onset  . Cancer Mother   . Diabetes Mother   . Hypertension Mother   . Breast cancer Mother 66  . Hypertension Father   . Heart failure Father   . Cancer Brother        bladder  . Cancer Sister        breast  . Breast cancer Sister 13  . Hypertension Son   . Hypertension Daughter   . Breast cancer Sister 21    Social History   Tobacco Use  . Smoking status: Former Smoker    Quit date: 09/09/2007    Years since quitting: 12.4  . Smokeless tobacco: Never Used  Substance Use Topics  . Alcohol use: No    Alcohol/week: 0.0 standard drinks     Current Outpatient Medications:  .  acetaminophen (TYLENOL) 500 MG tablet, Take 1,000-1,500 mg by mouth every 6 (six) hours as needed for mild pain., Disp: , Rfl:  .  allopurinol (ZYLOPRIM) 300 MG tablet, Take 1 tablet (300 mg total) by mouth daily., Disp: 90 tablet, Rfl: 1 .  clindamycin (CLEOCIN) 300 MG capsule, TAKE 600 MG (2 TABLETS)  1 HOUR PRIOR TO DENTAL PROCEDURE, Disp: , Rfl:  .  colchicine 0.6 MG tablet, Take 1-2 tablets (0.6-1.2 mg total) by mouth daily. And repeat in one hour for gout attacks prn, Disp: 30 tablet, Rfl: 0 .  Ergocalciferol (VITAMIN D2) 50 MCG (2000 UT) TABS, Take 2,000 Units by mouth daily., Disp: , Rfl:  .  ezetimibe (ZETIA) 10 MG tablet, Take 1 tablet (10 mg total) by mouth daily., Disp: 90 tablet, Rfl: 3 .  fluticasone (FLONASE) 50 MCG/ACT nasal spray, Place 2 sprays into both nostrils daily., Disp: 16 g, Rfl: 6 .  metoprolol tartrate (LOPRESSOR) 25 MG tablet, Take 1 tablet  (25 mg total) by mouth 2 (two) times daily., Disp: 180 tablet, Rfl: 1 .  potassium chloride SA (KLOR-CON M20) 20 MEQ tablet, Take 1 tablet (20 mEq total) by mouth 2 (two) times daily., Disp: 180 tablet, Rfl: 1 .  tiotropium (SPIRIVA HANDIHALER) 18 MCG inhalation capsule, INHALE 1 CASPULE VIA HANDIHALER ONCE A DAY AT THE SAME TIME EVERY DAY, Disp: 90 capsule, Rfl: 1 .  torsemide (DEMADEX) 20 MG tablet, Take 1 tablet (20 mg total) by mouth daily., Disp: 90 tablet, Rfl: 1 .  traZODone (DESYREL) 50 MG tablet, Take 1 tablet (50 mg total) by mouth at bedtime., Disp: 90 tablet, Rfl: 1 .  warfarin (COUMADIN) 5 MG tablet, Take 5 mg by mouth daily., Disp: , Rfl:   Allergies  Allergen Reactions  . Ace Inhibitors Cough and Other (See Comments)  . Amoxicillin-Pot Clavulanate Nausea And Vomiting  . Codeine Nausea And Vomiting  .  Penicillins Nausea And Vomiting and Other (See Comments)    Has patient had a PCN reaction causing immediate rash, facial/tongue/throat swelling, SOB or lightheadedness with hypotension: No Has patient had a PCN reaction causing severe rash involving mucus membranes or skin necrosis: No Has patient had a PCN reaction that required hospitalization No Has patient had a PCN reaction occurring within the last 10 years: No If all of the above answers are "NO", then may proceed with Cephalosporin use.  . Rosuvastatin Other (See Comments)    Reaction:  Joint pain   . Tetanus-Diphtheria Toxoids Td Swelling    I personally reviewed active problem list, medication list, allergies, family history, social history, health maintenance with the patient/caregiver today.   ROS  Constitutional: Negative for fever or weight change.  Respiratory: Negative for cough and shortness of breath.   Cardiovascular: Negative for chest pain or palpitations.  Gastrointestinal: Negative for abdominal pain, no bowel changes.  Musculoskeletal: Negative for gait problem or joint swelling.  Skin: Negative  for rash.  Neurological: Negative for dizziness or headache.  No other specific complaints in a complete review of systems (except as listed in HPI above).  Objective  Vitals:   02/20/20 1352  BP: 120/70  Pulse: 81  Resp: 16  Temp: (!) 97.5 F (36.4 C)  TempSrc: Temporal  SpO2: 98%  Weight: 181 lb 9.6 oz (82.4 kg)  Height: 5\' 3"  (1.6 m)    Body mass index is 32.17 kg/m.  Physical Exam  Constitutional: Patient appears well-developed and well-nourished. Obese No distress.  HEENT: head atraumatic, normocephalic, pupils equal and reactive to light,neck supple Cardiovascular: Normal rate, regular rhythm and normal heart sounds.  3/6 systolic ejection murmur  murmur heard. No BLE edema. Pulmonary/Chest: Effort normal and breath sounds normal. No respiratory distress. Abdominal: Soft.  There is no tenderness. Psychiatric: Patient has a normal mood and affect. behavior is normal. Judgment and thought content normal.  PHQ2/9: Depression screen [facility] 2/9 02/20/2020 01/05/2020 11/16/2019 09/19/2019 07/27/2019  Decreased Interest 0 0 0 0 0  Down, Depressed, Hopeless 0 0 0 0 0  PHQ - 2 Score 0 0 0 0 0  Altered sleeping 0 0 0 0 0  Tired, decreased energy 0 0 0 0 0  Change in appetite 0 0 0 0 0  Feeling bad or failure about yourself  0 0 0 0 0  Trouble concentrating 0 0 0 0 0  Moving slowly or fidgety/restless 0 0 0 0 0  Suicidal thoughts 0 0 0 0 0  PHQ-9 Score 0 0 0 0 0  Difficult doing work/chores - Not difficult at all Not difficult at all Not difficult at all Not difficult at all  Some recent data might be hidden    phq 9 is negative   Fall Risk: Fall Risk  02/20/2020 01/05/2020 11/16/2019 09/19/2019 07/27/2019  Falls in the past year? 0 0 0 0 0  Number falls in past yr: 0 0 0 0 0  Injury with Fall? 0 0 0 0 0  Comment - - - - -  Risk for fall due to : - - - - -  Follow up - - - - -     Functional Status Survey: Is the patient deaf or have difficulty hearing?: No Does the  patient have difficulty seeing, even when wearing glasses/contacts?: No Does the patient have difficulty concentrating, remembering, or making decisions?: No Does the patient have difficulty walking or climbing stairs?: No Does the patient have  difficulty dressing or bathing?: No Does the patient have difficulty doing errands alone such as visiting a doctor's office or shopping?: No    Assessment & Plan  1. Chronic combined systolic and diastolic CHF, NYHA class 1 (HCC)  - potassium chloride SA (KLOR-CON M20) 20 MEQ tablet; Take 1 tablet (20 mEq total) by mouth 2 (two) times daily.  Dispense: 180 tablet; Refill: 1 - torsemide (DEMADEX) 20 MG tablet; Take 1 tablet (20 mg total) by mouth daily.  Dispense: 90 tablet; Refill: 1  2. Need for hepatitis C screening test  - Hepatitis C antibody  3. Simple chronic bronchitis (HCC)  - tiotropium (SPIRIVA HANDIHALER) 18 MCG inhalation capsule; INHALE 1 CASPULE VIA HANDIHALER ONCE A DAY AT THE SAME TIME EVERY DAY  Dispense: 90 capsule; Refill: 1  4. Insomnia, unspecified type  - traZODone (DESYREL) 50 MG tablet; Take 1 tablet (50 mg total) by mouth at bedtime.  Dispense: 90 tablet; Refill: 1  5. Paroxysmal atrial fibrillation (HCC)  - metoprolol tartrate (LOPRESSOR) 25 MG tablet; Take 1 tablet (25 mg total) by mouth 2 (two) times daily.  Dispense: 180 tablet; Refill: 1  6. Benign hypertension  - torsemide (DEMADEX) 20 MG tablet; Take 1 tablet (20 mg total) by mouth daily.  Dispense: 90 tablet; Refill: 1  7. Controlled gout  - allopurinol (ZYLOPRIM) 300 MG tablet; Take 1 tablet (300 mg total) by mouth daily.  Dispense: 90 tablet; Refill: 1  8. Low potassium syndrome  - Potassium - Magnesium  9. Major depression in remission [facility])  Doing well at this time   10. Primary osteoarthritis of both knees  Doing well at this time, no pain

## 2020-02-21 LAB — HEPATITIS C ANTIBODY
Hepatitis C Ab: NONREACTIVE
SIGNAL TO CUT-OFF: 0.02 (ref <1.00)

## 2020-02-21 LAB — MAGNESIUM: Magnesium: 1.3 mg/dL — ABNORMAL LOW (ref 1.5–2.5)

## 2020-02-21 LAB — POTASSIUM: Potassium: 3.2 mmol/L — ABNORMAL LOW (ref 3.5–5.3)

## 2020-03-01 DIAGNOSIS — I480 Paroxysmal atrial fibrillation: Secondary | ICD-10-CM | POA: Diagnosis not present

## 2020-03-08 DIAGNOSIS — M76822 Posterior tibial tendinitis, left leg: Secondary | ICD-10-CM | POA: Diagnosis not present

## 2020-03-08 DIAGNOSIS — M76829 Posterior tibial tendinitis, unspecified leg: Secondary | ICD-10-CM | POA: Insufficient documentation

## 2020-04-19 DIAGNOSIS — R011 Cardiac murmur, unspecified: Secondary | ICD-10-CM | POA: Diagnosis not present

## 2020-04-19 DIAGNOSIS — E7800 Pure hypercholesterolemia, unspecified: Secondary | ICD-10-CM | POA: Diagnosis not present

## 2020-04-19 DIAGNOSIS — Q231 Congenital insufficiency of aortic valve: Secondary | ICD-10-CM | POA: Diagnosis not present

## 2020-04-19 DIAGNOSIS — I4891 Unspecified atrial fibrillation: Secondary | ICD-10-CM | POA: Diagnosis not present

## 2020-04-19 DIAGNOSIS — I480 Paroxysmal atrial fibrillation: Secondary | ICD-10-CM | POA: Diagnosis not present

## 2020-04-19 DIAGNOSIS — Z9889 Other specified postprocedural states: Secondary | ICD-10-CM | POA: Diagnosis not present

## 2020-04-19 DIAGNOSIS — J410 Simple chronic bronchitis: Secondary | ICD-10-CM | POA: Diagnosis not present

## 2020-04-19 DIAGNOSIS — I5022 Chronic systolic (congestive) heart failure: Secondary | ICD-10-CM | POA: Diagnosis not present

## 2020-04-19 DIAGNOSIS — I10 Essential (primary) hypertension: Secondary | ICD-10-CM | POA: Diagnosis not present

## 2020-04-19 DIAGNOSIS — Z7901 Long term (current) use of anticoagulants: Secondary | ICD-10-CM | POA: Diagnosis not present

## 2020-04-19 DIAGNOSIS — I509 Heart failure, unspecified: Secondary | ICD-10-CM | POA: Diagnosis not present

## 2020-05-09 DIAGNOSIS — M1A9XX1 Chronic gout, unspecified, with tophus (tophi): Secondary | ICD-10-CM | POA: Diagnosis not present

## 2020-05-09 DIAGNOSIS — L03011 Cellulitis of right finger: Secondary | ICD-10-CM | POA: Diagnosis not present

## 2020-05-25 DIAGNOSIS — I480 Paroxysmal atrial fibrillation: Secondary | ICD-10-CM | POA: Diagnosis not present

## 2020-06-01 DIAGNOSIS — Z9889 Other specified postprocedural states: Secondary | ICD-10-CM | POA: Diagnosis not present

## 2020-06-01 DIAGNOSIS — R011 Cardiac murmur, unspecified: Secondary | ICD-10-CM | POA: Diagnosis not present

## 2020-06-26 ENCOUNTER — Other Ambulatory Visit: Payer: Self-pay | Admitting: Family Medicine

## 2020-06-26 DIAGNOSIS — G8929 Other chronic pain: Secondary | ICD-10-CM

## 2020-06-26 DIAGNOSIS — M25562 Pain in left knee: Secondary | ICD-10-CM

## 2020-06-28 ENCOUNTER — Encounter: Payer: Self-pay | Admitting: Family Medicine

## 2020-06-28 ENCOUNTER — Other Ambulatory Visit: Payer: Self-pay

## 2020-06-28 ENCOUNTER — Ambulatory Visit (INDEPENDENT_AMBULATORY_CARE_PROVIDER_SITE_OTHER): Payer: Medicare Other | Admitting: Family Medicine

## 2020-06-28 VITALS — BP 120/80 | HR 85 | Temp 98.1°F | Resp 16 | Ht 63.0 in | Wt 177.5 lb

## 2020-06-28 DIAGNOSIS — Z23 Encounter for immunization: Secondary | ICD-10-CM | POA: Diagnosis not present

## 2020-06-28 DIAGNOSIS — R790 Abnormal level of blood mineral: Secondary | ICD-10-CM | POA: Diagnosis not present

## 2020-06-28 DIAGNOSIS — I5042 Chronic combined systolic (congestive) and diastolic (congestive) heart failure: Secondary | ICD-10-CM

## 2020-06-28 DIAGNOSIS — J410 Simple chronic bronchitis: Secondary | ICD-10-CM

## 2020-06-28 DIAGNOSIS — E8881 Metabolic syndrome: Secondary | ICD-10-CM | POA: Diagnosis not present

## 2020-06-28 DIAGNOSIS — E559 Vitamin D deficiency, unspecified: Secondary | ICD-10-CM

## 2020-06-28 DIAGNOSIS — E876 Hypokalemia: Secondary | ICD-10-CM | POA: Diagnosis not present

## 2020-06-28 DIAGNOSIS — G720 Drug-induced myopathy: Secondary | ICD-10-CM

## 2020-06-28 DIAGNOSIS — E785 Hyperlipidemia, unspecified: Secondary | ICD-10-CM | POA: Diagnosis not present

## 2020-06-28 DIAGNOSIS — I272 Pulmonary hypertension, unspecified: Secondary | ICD-10-CM | POA: Diagnosis not present

## 2020-06-28 DIAGNOSIS — I10 Essential (primary) hypertension: Secondary | ICD-10-CM

## 2020-06-28 DIAGNOSIS — R739 Hyperglycemia, unspecified: Secondary | ICD-10-CM

## 2020-06-28 DIAGNOSIS — M109 Gout, unspecified: Secondary | ICD-10-CM | POA: Diagnosis not present

## 2020-06-28 DIAGNOSIS — T466X5A Adverse effect of antihyperlipidemic and antiarteriosclerotic drugs, initial encounter: Secondary | ICD-10-CM

## 2020-06-28 DIAGNOSIS — I480 Paroxysmal atrial fibrillation: Secondary | ICD-10-CM

## 2020-06-28 DIAGNOSIS — N6459 Other signs and symptoms in breast: Secondary | ICD-10-CM

## 2020-06-28 NOTE — Patient Instructions (Signed)
Check coverage with shingrix

## 2020-06-28 NOTE — Progress Notes (Signed)
Name: [name]   MRN: [id number]    DOB: 1941/03/06   Date:06/28/2020       Progress Note  Subjective  Chief Complaint  Chief Complaint  Patient presents with   Breast Problem   Follow-up    HPI   Chronic combined CHF: she is on Demadex, potassium and magnesium daily, leg cramps still present but not as severe. Offdigoxinbut is taking metoprolol for rate control.  No chest , no SOB at rest, but has some with moderate activity, she denies orthopnea, no lower extremity edema. She is not on ACE because of cough, she was on Losartan for a period of time but bp dropped so cardiologist discontinued medications. She is up to date Dr. Clayborn Bigness   HTN: taking metoprolol, bp is well controlled  No chest painshe has mild SOB with activity but is stable.   Hyperlipidemia:she stopped taking Atorvastatin but is taking Zetia, she states her sister takes a medication and will find out the name and let me know She states stopped statin because of muscle pain,  but willing to take Repatha if needed   Insomnia:shetakes Trazodone prnusually just half a pill, most nights she sleeps without medication. She states caffeine disturbs her sleep   Gout:no recent episodes, on Allopurinol   Nipple changes: she states over the past few months she has noticed red nipple getting red and scaly , no pain or discharge. Mammogram done Dec 7829   Dysmetabolic Syndrome: She denies polyphagia, polyuria or polydipsiaLast hgbA1Cwas 5.7%, she was on a keto diet but now back to normal.   Afib: rate controlled, no fluttering sensation, taking betablocker andoffdigoxin, also on coumadin and goes to coumadin clinic/Dr. Callwood. Denies bleeding or bruising   Chronic bronchitis : She states she has a productive cough in am's, SOB only intermittently with moderate activity.Quit smoking in 2010.She uses Spiriva daily   Depression:shehas a long history of depression, used to take SSRI's, symptoms  now are intermittent and has been in remission for months, only symptoms is difficulty sleeping and takes trazodone prn only, occasionally only. Shedenies suicidal thoughts or ideation.She is motivated, working part timeand is feeling well. Doing well    Patient Active Problem List   Diagnosis Date Noted   Morbid obesity ([place]) 01/05/2020   On Coumadin for atrial fibrillation ([place]) 06/30/2018   Chronic anticoagulation 08/27/2017   History of right breast cancer 08/27/2017   Chronic obstructive pulmonary disease ([place]) 07/04/2015   Controlled gout 06/06/2015   Anxiety 02/09/2015   Synovial cyst of popliteal space 02/09/2015   Benign hypertension 02/09/2015   Blood type A+ 02/09/2015   Arterial branch occlusion of retina 02/09/2015   Chronic constipation 02/09/2015   Insomnia, persistent 02/09/2015   Chronic combined systolic and diastolic CHF, NYHA class 1 ([place]) 02/09/2015   Dyslipidemia 02/09/2015   Atrial fibrillation ([place]) 02/09/2015   Arthritis urica 02/09/2015   Hearing loss 02/09/2015   History of open heart surgery 02/09/2015   Chronic hoarseness 02/09/2015   Cardiomegaly 56/21/3086   Dysmetabolic syndrome 57/84/6962   Migraine with aura and without status migrainosus, not intractable 02/09/2015   Hypertensive pulmonary vascular disease ([place]) 02/09/2015   Allergic rhinitis, seasonal 02/09/2015   Vitamin D deficiency 02/09/2015   History of mitral valve repair 10/01/2012   History of aortic valve replacement 10/01/2012   Congestive heart failure ([place]) 10/01/2012    Past Surgical History:  Procedure Laterality Date   ABDOMINAL HYSTERECTOMY  1996   arm surgery  2019  BREAST BIOPSY Right 1998   neg   BREAST BIOPSY Right 2007   neg   BREAST BIOPSY Right 1992   positive   BREAST EXCISIONAL BIOPSY Left 2000   neg   BREAST LUMPECTOMY Right 1992   CARDIAC VALVE REPLACEMENT  2014   CATARACT EXTRACTION W/PHACO Left 03/30/2018    Procedure: CATARACT EXTRACTION PHACO AND INTRAOCULAR LENS PLACEMENT ([place]);  Surgeon: Birder Robson, MD;  Location: [facility] ORS;  Service: Ophthalmology;  Laterality: Left;  [place] 00:31.9 AP% 12.1 CDE 3.87 Fluid Pack lot # [id number]   FRACTURE SURGERY Left    ARM/ LEG   LEG SURGERY      Family History  Problem Relation Age of Onset   Cancer Mother    Diabetes Mother    Hypertension Mother    Breast cancer Mother 56   Hypertension Father    Heart failure Father    Cancer Brother        bladder   Cancer Sister        breast   Breast cancer Sister 95   Hypertension Son    Hypertension Daughter    Breast cancer Sister 40    Social History   Tobacco Use   Smoking status: Former Smoker    Quit date: 09/09/2007    Years since quitting: 12.8   Smokeless tobacco: Never Used  Substance Use Topics   Alcohol use: No    Alcohol/week: 0.0 standard drinks     Current Outpatient Medications:    acetaminophen (TYLENOL) 500 MG tablet, Take 1,000-1,500 mg by mouth every 6 (six) hours as needed for mild pain., Disp: , Rfl:    allopurinol (ZYLOPRIM) 300 MG tablet, Take 1 tablet (300 mg total) by mouth daily., Disp: 90 tablet, Rfl: 1   colchicine 0.6 MG tablet, Take 1-2 tablets (0.6-1.2 mg total) by mouth daily. And repeat in one hour for gout attacks prn, Disp: 30 tablet, Rfl: 0   Ergocalciferol (VITAMIN D2) 50 MCG (2000 UT) TABS, Take 2,000 Units by mouth daily., Disp: , Rfl:    ezetimibe (ZETIA) 10 MG tablet, Take 1 tablet (10 mg total) by mouth daily., Disp: 90 tablet, Rfl: 3   fluticasone (FLONASE) 50 MCG/ACT nasal spray, Place 2 sprays into both nostrils daily., Disp: 16 g, Rfl: 6   metoprolol tartrate (LOPRESSOR) 25 MG tablet, Take 1 tablet (25 mg total) by mouth 2 (two) times daily., Disp: 180 tablet, Rfl: 1   potassium chloride SA (KLOR-CON M20) 20 MEQ tablet, Take 1 tablet (20 mEq total) by mouth 2 (two) times daily., Disp: 180 tablet, Rfl: 1   tiotropium  (SPIRIVA HANDIHALER) 18 MCG inhalation capsule, INHALE 1 CASPULE VIA HANDIHALER ONCE A DAY AT THE SAME TIME EVERY DAY, Disp: 90 capsule, Rfl: 1   torsemide (DEMADEX) 20 MG tablet, Take 1 tablet (20 mg total) by mouth daily., Disp: 90 tablet, Rfl: 1   traZODone (DESYREL) 50 MG tablet, Take 1 tablet (50 mg total) by mouth at bedtime., Disp: 90 tablet, Rfl: 1   warfarin (COUMADIN) 5 MG tablet, Take 5 mg by mouth daily., Disp: , Rfl:    clindamycin (CLEOCIN) 300 MG capsule, TAKE 600 MG (2 TABLETS)  1 HOUR PRIOR TO DENTAL PROCEDURE (Patient not taking: Reported on 06/28/2020), Disp: , Rfl:   Allergies  Allergen Reactions   Ace Inhibitors Cough and Other (See Comments)   Amoxicillin-Pot Clavulanate Nausea And Vomiting   Codeine Nausea And Vomiting   Penicillins Nausea And Vomiting and Other (See Comments)  Has patient had a PCN reaction causing immediate rash, facial/tongue/throat swelling, SOB or lightheadedness with hypotension: No Has patient had a PCN reaction causing severe rash involving mucus membranes or skin necrosis: No Has patient had a PCN reaction that required hospitalization No Has patient had a PCN reaction occurring within the last 10 years: No If all of the above answers are "NO", then may proceed with Cephalosporin use.   Rosuvastatin Other (See Comments)    Reaction:  Joint pain    Tetanus-Diphtheria Toxoids Td Swelling    I personally reviewed active problem list, medication list, allergies, family history, social history, health maintenance with the patient/caregiver today.   ROS  Constitutional: Negative for fever or weight change.  Respiratory: positive  for cough and shortness of breath.   Cardiovascular: Negative for chest pain or palpitations.  Gastrointestinal: Negative for abdominal pain, no bowel changes.  Musculoskeletal: Negative for gait problem or joint swelling.  Skin: Negative for rash.  Neurological: Negative for dizziness or headache.  No  other specific complaints in a complete review of systems (except as listed in HPI above).  Objective   Vitals:   06/28/20 1413  BP: 120/80  Pulse: 85  Resp: 16  Temp: 98.1 F (36.7 C)  SpO2: 99%  Weight: 177 lb 8 oz (80.5 kg)  Height: 5\' 3"  (1.6 m)    Body mass index is 31.44 kg/m.  Physical Exam  Constitutional: Patient appears well-developed and well-nourished. Obese  No distress.  HEENT: head atraumatic, normocephalic, pupils equal and reactive to light,neck supple Cardiovascular: Normal rate, regular rhythm and normal heart sounds.  2/6 systolic  murmur heard. No BLE edema. Pulmonary/Chest: Effort normal and breath sounds normal. No respiratory distress. Abdominal: Soft.  There is no tenderness. Breast : left larger than right, lumpy in the upper inner quadrant, left nipple larger than left, red, no drainage. Scars from previous biopsies noticed  Psychiatric: Patient has a normal mood and affect. behavior is normal. Judgment and thought content normal.  PHQ2/9: Depression screen [facility] 2/9 06/28/2020 02/20/2020 01/05/2020 11/16/2019 09/19/2019  Decreased Interest 0 0 0 0 0  Down, Depressed, Hopeless 0 0 0 0 0  PHQ - 2 Score 0 0 0 0 0  Altered sleeping - 0 0 0 0  Tired, decreased energy - 0 0 0 0  Change in appetite - 0 0 0 0  Feeling bad or failure about yourself  - 0 0 0 0  Trouble concentrating - 0 0 0 0  Moving slowly or fidgety/restless - 0 0 0 0  Suicidal thoughts - 0 0 0 0  PHQ-9 Score - 0 0 0 0  Difficult doing work/chores - - Not difficult at all Not difficult at all Not difficult at all  Some recent data might be hidden    phq 9 is negative   Fall Risk: Fall Risk  06/28/2020 02/20/2020 01/05/2020 11/16/2019 09/19/2019  Falls in the past year? 0 0 0 0 0  Number falls in past yr: 0 0 0 0 0  Injury with Fall? 0 0 0 0 0  Comment - - - - -  Risk for fall due to : - - - - -  Follow up - - - - -    Functional Status Survey: Is the patient deaf or have  difficulty hearing?: No Does the patient have difficulty seeing, even when wearing glasses/contacts?: No Does the patient have difficulty concentrating, remembering, or making decisions?: No Does the patient have difficulty walking  or climbing stairs?: No Does the patient have difficulty dressing or bathing?: No Does the patient have difficulty doing errands alone such as visiting a doctor's office or shopping?: No    Assessment & Plan  1. Nipple problem  - MM Digital Diagnostic Bilat; Future - US BREAST LTD UNI LEFT INC AXILLA; Future  2. Paroxysmal atrial fibrillation (HCC)  Rate controlled   3. Simple chronic bronchitis ([place])  She states recently started using Spiriva daily   4. Benign hypertension  At goal - COMPLETE METABOLIC PANEL WITH GFR - CBC with Differential/Platelet  5. Vitamin D deficiency  Take supplementation   6. Hyperglycemia  - Hemoglobin A1c  7. Dysmetabolic syndrome   8. Controlled gout   9. Chronic combined systolic and diastolic CHF, NYHA class 1 [facility])  Sees cardiologist who manages her coumadin, [facility]   10. Low potassium syndrome  We will recheck level   11. Statin myopathy   12. Pulmonary hypertension ([place])   13. Low magnesium level  - Magnesium  14. Dyslipidemia  - Lipid panel  15. Needs flu shot  Flu today

## 2020-06-29 ENCOUNTER — Other Ambulatory Visit: Payer: Self-pay | Admitting: Family Medicine

## 2020-06-29 DIAGNOSIS — G720 Drug-induced myopathy: Secondary | ICD-10-CM

## 2020-06-29 DIAGNOSIS — E785 Hyperlipidemia, unspecified: Secondary | ICD-10-CM

## 2020-06-29 DIAGNOSIS — E876 Hypokalemia: Secondary | ICD-10-CM

## 2020-06-29 DIAGNOSIS — I5042 Chronic combined systolic (congestive) and diastolic (congestive) heart failure: Secondary | ICD-10-CM

## 2020-06-29 DIAGNOSIS — T466X5A Adverse effect of antihyperlipidemic and antiarteriosclerotic drugs, initial encounter: Secondary | ICD-10-CM

## 2020-06-29 LAB — CBC WITH DIFFERENTIAL/PLATELET
Absolute Monocytes: 495 {cells}/uL (ref 200–950)
Basophils Absolute: 28 {cells}/uL (ref 0–200)
Basophils Relative: 0.5 %
Eosinophils Absolute: 83 {cells}/uL (ref 15–500)
Eosinophils Relative: 1.5 %
HCT: 38.4 % (ref 35.0–45.0)
Hemoglobin: 12.5 g/dL (ref 11.7–15.5)
Lymphs Abs: 2536 {cells}/uL (ref 850–3900)
MCH: 29.6 pg (ref 27.0–33.0)
MCHC: 32.6 g/dL (ref 32.0–36.0)
MCV: 90.8 fL (ref 80.0–100.0)
MPV: 11.5 fL (ref 7.5–12.5)
Monocytes Relative: 9 %
Neutro Abs: 2360 {cells}/uL (ref 1500–7800)
Neutrophils Relative %: 42.9 %
Platelets: 223 Thousand/uL (ref 140–400)
RBC: 4.23 Million/uL (ref 3.80–5.10)
RDW: 13.6 % (ref 11.0–15.0)
Total Lymphocyte: 46.1 %
WBC: 5.5 Thousand/uL (ref 3.8–10.8)

## 2020-06-29 LAB — COMPLETE METABOLIC PANEL WITH GFR
AG Ratio: 1.9 (calc) (ref 1.0–2.5)
ALT: 23 U/L (ref 6–29)
AST: 27 U/L (ref 10–35)
Albumin: 4.4 g/dL (ref 3.6–5.1)
Alkaline phosphatase (APISO): 68 U/L (ref 37–153)
BUN/Creatinine Ratio: 25 (calc) — ABNORMAL HIGH (ref 6–22)
BUN: 24 mg/dL (ref 7–25)
CO2: 33 mmol/L — ABNORMAL HIGH (ref 20–32)
Calcium: 9.5 mg/dL (ref 8.6–10.4)
Chloride: 100 mmol/L (ref 98–110)
Creat: 0.95 mg/dL — ABNORMAL HIGH (ref 0.60–0.93)
GFR, Est African American: 67 mL/min/{1.73_m2} (ref >=60)
GFR, Est Non African American: 58 mL/min/{1.73_m2} — ABNORMAL LOW (ref >=60)
Globulin: 2.3 g/dL (calc) (ref 1.9–3.7)
Glucose, Bld: 100 mg/dL — ABNORMAL HIGH (ref 65–99)
Potassium: 2.9 mmol/L — ABNORMAL LOW (ref 3.5–5.3)
Sodium: 142 mmol/L (ref 135–146)
Total Bilirubin: 0.7 mg/dL (ref 0.2–1.2)
Total Protein: 6.7 g/dL (ref 6.1–8.1)

## 2020-06-29 LAB — HEMOGLOBIN A1C
Hgb A1c MFr Bld: 5.7 % of total Hgb — ABNORMAL HIGH (ref <5.7)
Mean Plasma Glucose: 117 (calc)
eAG (mmol/L): 6.5 (calc)

## 2020-06-29 LAB — LIPID PANEL
Cholesterol: 225 mg/dL — ABNORMAL HIGH (ref <200)
HDL: 48 mg/dL — ABNORMAL LOW (ref >=50)
LDL Cholesterol (Calc): 145 mg/dL (calc) — ABNORMAL HIGH
Non-HDL Cholesterol (Calc): 177 mg/dL (calc) — ABNORMAL HIGH (ref <130)
Total CHOL/HDL Ratio: 4.7 (calc) (ref <5.0)
Triglycerides: 181 mg/dL — ABNORMAL HIGH (ref <150)

## 2020-06-29 LAB — MAGNESIUM: Magnesium: 1.4 mg/dL — ABNORMAL LOW (ref 1.5–2.5)

## 2020-06-29 MED ORDER — REPATHA 140 MG/ML ~~LOC~~ SOSY: 1.0000 mL | PREFILLED_SYRINGE | SUBCUTANEOUS | 5 refills | Status: DC

## 2020-06-29 NOTE — Telephone Encounter (Signed)
° °  Notes to clinic See insurance situation.

## 2020-07-03 ENCOUNTER — Other Ambulatory Visit: Payer: Self-pay

## 2020-07-03 ENCOUNTER — Other Ambulatory Visit: Payer: Self-pay | Admitting: Family Medicine

## 2020-07-03 ENCOUNTER — Telehealth: Payer: Self-pay

## 2020-07-05 ENCOUNTER — Other Ambulatory Visit: Payer: Self-pay | Admitting: Family Medicine

## 2020-07-05 DIAGNOSIS — E785 Hyperlipidemia, unspecified: Secondary | ICD-10-CM

## 2020-07-06 DIAGNOSIS — Z23 Encounter for immunization: Secondary | ICD-10-CM | POA: Diagnosis not present

## 2020-07-12 DIAGNOSIS — N6459 Other signs and symptoms in breast: Secondary | ICD-10-CM | POA: Diagnosis not present

## 2020-07-12 DIAGNOSIS — C50012 Malignant neoplasm of nipple and areola, left female breast: Secondary | ICD-10-CM | POA: Diagnosis not present

## 2020-07-17 LAB — SURGICAL PATHOLOGY

## 2020-07-18 ENCOUNTER — Encounter: Payer: Self-pay | Admitting: Family Medicine

## 2020-07-18 DIAGNOSIS — C50019 Malignant neoplasm of nipple and areola, unspecified female breast: Secondary | ICD-10-CM | POA: Insufficient documentation

## 2020-07-18 DIAGNOSIS — C50011 Malignant neoplasm of nipple and areola, right female breast: Secondary | ICD-10-CM | POA: Insufficient documentation

## 2020-07-19 ENCOUNTER — Other Ambulatory Visit: Payer: Self-pay | Admitting: General Surgery

## 2020-07-19 DIAGNOSIS — I480 Paroxysmal atrial fibrillation: Secondary | ICD-10-CM | POA: Diagnosis not present

## 2020-07-19 DIAGNOSIS — C50012 Malignant neoplasm of nipple and areola, left female breast: Secondary | ICD-10-CM

## 2020-07-19 NOTE — Progress Notes (Signed)
Subjective:     Patient ID: [name] is a 79 y.o. female.  HPI  The following portions of the patient's history were reviewed and updated as appropriate.  This an established patient is here today for: office visit. The patient has been referred today by Dr. Ancil Boozer for evaluation of left nipple change and complaint of lumpy breast inner quadrant. The patient reports she first noticed the change about 4 months ago. She does have a history of right breast cancer followed by radiation and tamoxifen. The patient states Dr. Bary Castilla did her breast surgery about 20 years ago. She reports her last mammogram was done December 2020 at [facility] and was normal.        Chief Complaint  Patient presents with  . Breast Problem    left nipple change and c/o lumpy breast inner quadrant     BP 126/88   Pulse 74   Temp 36.7 C (98.1 F)   Ht 158.8 cm (5' 2.5")   Wt 82.1 kg (181 lb)   SpO2 98%   BMI 32.58 kg/m       Past Medical History:  Diagnosis Date  . Aortic valve regurgitation   . Aortic valve stenosis   . Atrial fibrillation (CMS-HCC)   . Atrial fibrillation status post cardioversion (CMS-HCC)   . Bicuspid aortic valve   . Cancer (CMS-HCC)    breast carcinoma  . Cardiomyopathy secondary (CMS-HCC)   . CHF (congestive heart failure) (CMS-HCC) 10/01/2012  . Chronic anticoagulation    For atrial fibrillation  . Chronic systolic heart failure (CMS-HCC)   . COPD (chronic obstructive pulmonary disease) (CMS-HCC)   . Gout   . Heart murmur, unspecified   . History of radiation therapy   . Hypercholesteremia 10/01/2012  . Hypertension   . PONV (postoperative nausea and vomiting)   . Pulmonary hypertension (CMS-HCC)   . S/P radiation therapy 1988   Left Breast for atypical cells  . Shortness of breath           Past Surgical History:  Procedure Laterality Date  . APPLICATION ARM SPLINT Left 09/02/2017   Procedure: APPLICATION OF SHORT ARM SPLINT  (FOREARM TO HAND); STATIC;  Surgeon: Juanetta Snow, MD;  Location: [place];  Service: Orthopedics;  Laterality: Left;  . APPLICATION ARM SPLINT Left 12/09/2017   Procedure: APPLICATION OF SHORT ARM SPLINT;  Surgeon: Juanetta Snow, MD;  Location: [place];  Service: Orthopedics;  Laterality: Left;  . BREAST SURGERY Right 1993   lumpectomy for malignant cells  . BREAST SURGERY Left 1993   lumpectomy, benign cells  . CARDIOVERSION EXTERNAL    . COLONOSCOPY    . EYE SURGERY    . HYSTERECTOMY  1996   for fibroids  . INTRAOPERATIVE FLUOROSCOPY Left 08/27/2017   Procedure: FLUOROSCOPY (SEPARATE PROCEDURE), UP TO 1 HOUR PHYSICIAN OR OTHER QUALIFIED HEALTH CARE PROFESSIONAL TIME;  Surgeon: Jones Broom, MD;  Location: [place];  Service: Orthopedics;  Laterality: Left;  . INTRAOPERATIVE FLUOROSCOPY Left 09/02/2017   Procedure: FLUOROSCOPY (SEPARATE PROCEDURE), UP TO 1 HOUR PHYSICIAN OR OTHER QUALIFIED HEALTH CARE PROFESSIONAL TIME;  Surgeon: Juanetta Snow, MD;  Location: [place];  Service: Orthopedics;  Laterality: Left;  . INTRAOPERATIVE FLUOROSCOPY Left 12/09/2017   Procedure: FLUOROSCOPY;  Surgeon: Juanetta Snow, MD;  Location: [place];  Service: Orthopedics;  Laterality: Left;  . OPEN REDUCTION TIBIAL FRACTURE PROXIMAL Left 08/27/2017   Procedure: OPEN TREATMENT OF TIBIAL FRACTURE, PROXIMAL (PLATEAU);  BICONDYLAR, WITH OR WITHOUT INTERNAL FIXATION;  Surgeon: Jones Broom, MD;  Location: [place];  Service: Orthopedics;  Laterality: Left;  . PERCUTANEOUS TRANSLUMINAL BALLOON ANGIOPLASTY AORTA Left 10/11/2012   Procedure: PERCUTANEOUS TRANSLUMINAL BALLOON ANGIOPLASTY AORTA;  Surgeon: Danna Hefty, MD;  Location: [facility];  Service: Cardiothoracic;  Laterality: Left;  . REMOVAL HARDWARE WRIST/HAND/FINGER Left 12/09/2017   Procedure: REMOVAL OF IMPLANT; WRIST;  Surgeon: Juanetta Snow, MD;   Location: [place];  Service: Orthopedics;  Laterality: Left;  . REPAIR MITRAL VALVE N/A 10/11/2012   Procedure: REPAIR MITRAL VALVE;  Surgeon: Danna Hefty, MD;  Location: [facility];  Service: Cardiothoracic;  Laterality: N/A;  . REPLACEMENT AORTIC VALVE N/A 10/11/2012   Procedure: REPLACEMENT AORTIC VALVE;  Surgeon: Danna Hefty, MD;  Location: [facility];  Service: Cardiothoracic;  Laterality: N/A;  . STERNOTOMY MEDIAN N/A 10/11/2012   Procedure: STERNOTOMY MEDIAN;  Surgeon: Danna Hefty, MD;  Location: [facility];  Service: Cardiothoracic;  Laterality: N/A;  . TRANSESOPHAGEAL ECHOCARDIOGRAPHY N/A 10/11/2012   Procedure: TRANSESOPHAGEAL ECHOCARDIOGRAPHY;  Surgeon: Danna Hefty, MD;  Location: [facility];  Service: Cardiothoracic;  Laterality: N/A;              OB History    Gravida  2   Para  2   Term      Preterm      AB      Living        SAB      IAB      Ectopic      Molar      Multiple      Live Births          Obstetric Comments  Age at first period 68 Age of first pregnancy 21        Social History          Socioeconomic History  . Marital status: Widowed    Spouse name: Not on file  . Number of children: 2  . Years of education: 12 th grade education  . Highest education level: 12th grade  Occupational History  . 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 Smoking status: Former Smoker    Packs/day: 0.50    Years: 37.00    Pack years: 18.50    Types: Cigarettes    Quit date: 10/01/2009    Years since quitting: 10.7  . Smokeless tobacco: Never Used  Vaping Use  . Vaping Use: Never used  Substance and Sexual Activity  . Alcohol use: No  . Drug use: No  . Sexual activity: Defer  Other Topics Concern  . Not on file  Social History Narrative   Divorced, retired.   Social Determinants of Health    Financial Resource Strain: Not on file  Food Insecurity: Not on file  Transportation Needs: Not on file            Allergies  Allergen Reactions  . Ace Inhibitors Cough  . Augmentin [Amoxicillin-Pot Clavulanate] Vomiting  . Codeine Vomiting  . Penicillins Nausea  . Rosuvastatin Unknown    Reaction:  Joint pain     Current Medications        Current Outpatient Medications  Medication Sig Dispense Refill  . acetaminophen (TYLENOL) 500 MG tablet Take by mouth    . allopurinol (ZYLOPRIM) 300 MG tablet TAKE 1 TABLET BY MOUTH DAILY    .  aspirin 81 MG EC tablet Take 81 mg by mouth once daily    . atorvastatin (LIPITOR) 80 MG tablet Take 80 mg by mouth nightly      . chlorpheniramine (CHLOR-TRIMETON) 4 mg tablet Take by mouth    . clindamycin (CLEOCIN) 300 MG capsule TAKE 600 MG (2 TABLETS)  1 HOUR PRIOR TO DENTAL PROCEDURE 2 capsule 6  . colchicine (COLCRYS) 0.6 mg tablet Take 2 tablets (1.2mg ) by mouth at first sign of gout flare followed by 1 tablet (0.6mg ) after 1 hour. (Max 1.8mg  within 1 hour) 10 tablet 2  . diclofenac (VOLTAREN) 1 % topical gel diclofenac 1 % topical gel  APPLY 4 GRAMS TOPICALLY 4 (FOUR) TIMES DAILY.    . diphenhydramine-acetaminophen (TYLENOL PM EXTRA STRENGTH) 25-500 mg per tablet Take 1 tablet by mouth nightly as needed    . ergocalciferol, vitamin D2, 1,250 mcg (50,000 unit) capsule ergocalciferol (vitamin D2) 1,250 mcg (50,000 unit) capsule    . ezetimibe (ZETIA) 10 mg tablet     . fluticasone propionate (FLONASE) 50 mcg/actuation nasal spray     . HYDROcodone-acetaminophen (NORCO) 5-325 mg tablet Take 1 tablet by mouth every 6 (six) hours as needed for up to 12 doses 12 tablet 0  . methylPREDNISolone (MEDROL DOSEPACK) 4 mg tablet Follow package directions. 21 tablet 0  . metoprolol succinate (TOPROL-XL) 25 MG XL tablet Take by mouth    . metoprolol tartrate (LOPRESSOR) 25 MG tablet Take 0.5 tablets (12.5 mg total) by mouth  every 12 (twelve) hours    . potassium chloride (K-DUR,KLOR-CON) 20 MEQ ER tablet Take 1 tablet (20 mEq total) by mouth daily. 60 tablet 0  . tiotropium (SPIRIVA) 18 mcg inhalation capsule Place 18 mcg into inhaler and inhale daily.    . TORsemide (DEMADEX) 20 MG tablet Take 1 tablet (20 mg total) by mouth once daily 60 tablet 6  . traMADoL (ULTRAM) 50 mg tablet TAKE 1 TABLET EVERY 6 HOURS BY ORAL ROUTE.    . traZODone (DESYREL) 50 MG tablet Take 50 mg by mouth nightly as needed      . warfarin (COUMADIN) 5 MG tablet Take 5 mg by mouth once daily    . warfarin (COUMADIN) 5 MG tablet TAKE 1 TAB ON SUNDAY,MON,WED,AND FRI. TAKE 1&1/2 TABS ON TUES,THURS,AND SAT 90 tablet 3   No current facility-administered medications for this visit.           Family History  Problem Relation Age of Onset  . Cardiomyopathy (Abnormal function of the heart muscle) Mother   . Alzheimer's disease Mother   . Heart murmur Father   . Coronary Artery Disease (Blocked arteries around heart) Father   . Kidney failure Father   . Breast cancer Sister   . Bladder Cancer Brother   . High blood pressure (Hypertension) Daughter   . High blood pressure (Hypertension) Son   . Anesthesia problems Neg Hx   . Colon cancer Neg Hx     Review of Systems  Constitutional: Negative for chills and fever.  Respiratory: Negative for cough.        Objective:   Physical Exam Exam conducted with a chaperone present.  Constitutional:      Appearance: Normal appearance.  Cardiovascular:     Rate and Rhythm: Normal rate. Rhythm irregularly irregular.     Pulses: Normal pulses.     Heart sounds: Murmur heard.   Systolic murmur is present with a grade of 2/6.   Pulmonary:  Effort: Pulmonary effort is normal.     Breath sounds: Normal breath sounds.  Chest:  Breasts:     Right: No axillary adenopathy or supraclavicular adenopathy.     Left: Mass (effacement and enlargement of the nipple. )  present. No axillary adenopathy or supraclavicular adenopathy.     Musculoskeletal:     Cervical back: Neck supple.  Lymphadenopathy:     Upper Body:     Right upper body: No supraclavicular or axillary adenopathy.     Left upper body: No supraclavicular or axillary adenopathy.  Skin:    General: Skin is warm and dry.  Neurological:     Mental Status: She is alert and oriented to person, place, and time.  Psychiatric:        Mood and Affect: Mood normal.        Behavior: Behavior normal.    Labs and Radiology:   August 31, 2019 screening mammogram was independently reviewed. Scant residual breast parenchyma. No abnormality of the nipple-areolar complex. Scarring on the right from prior surgery/radiation.  Ultrasound exam:  Ultrasound examination of the left nipple areolar complex was completed. There is marked thickening of the skin in the area involving the nipple, less so involving the areola. No dilated structures in the retroareolar area.  Photo in media.  August 01, 2017 ECG results records atrial fibrillation. Left bundle branch block. (4 years post valve replacement).  April 19, 2020 cardiology note: Patient status post bioprosthetic aortic valve and mitral valve repair, patient also has paroxysmal atrial fibrillation on Coumadin    Assessment:     Paget's disease of the left nipple.    Plan:     The patient was amenable to having a biopsy completed. 10 cc of 0.5% Xylocaine with 0.25% Marcaine with 1: 200,000 units of epinephrine was utilized and well-tolerated. 2-2.5 mm punch biopsies were obtained from the nipple. The defect was closed with 4-0 nylon suture. Telfa and Tegaderm dressing applied.  The procedure was well-tolerated.  Return for suture removal in one week.   We will call you with the results.     Entered by Ledell Noss, CMA, acting as a scribe for Dr. Hervey Ard, MD.   The documentation recorded by the scribe accurately  reflects the service I personally performed and the decisions made by me.   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    Plan:     Resection of the left nipple areolar complex after diagnostic mammogram to confirm no other pathology in the breast has been recommended and accepted.  In light of her valvular heart disease and the superficial nature of the planned surgery will have her continue her Coumadin.

## 2020-07-20 ENCOUNTER — Other Ambulatory Visit: Payer: Self-pay | Admitting: Family Medicine

## 2020-07-20 DIAGNOSIS — I5042 Chronic combined systolic (congestive) and diastolic (congestive) heart failure: Secondary | ICD-10-CM

## 2020-07-20 DIAGNOSIS — I10 Essential (primary) hypertension: Secondary | ICD-10-CM

## 2020-07-25 ENCOUNTER — Other Ambulatory Visit: Payer: Self-pay | Admitting: Nephrology

## 2020-07-25 ENCOUNTER — Other Ambulatory Visit (HOSPITAL_COMMUNITY): Payer: Self-pay | Admitting: Nephrology

## 2020-07-25 DIAGNOSIS — N1831 Chronic kidney disease, stage 3a: Secondary | ICD-10-CM | POA: Diagnosis not present

## 2020-07-25 DIAGNOSIS — R809 Proteinuria, unspecified: Secondary | ICD-10-CM

## 2020-07-25 DIAGNOSIS — I10 Essential (primary) hypertension: Secondary | ICD-10-CM

## 2020-07-25 DIAGNOSIS — E876 Hypokalemia: Secondary | ICD-10-CM | POA: Diagnosis not present

## 2020-07-31 ENCOUNTER — Ambulatory Visit
Admission: RE | Admit: 2020-07-31 | Discharge: 2020-07-31 | Disposition: A | Discharge status: Home / Self Care | Payer: Medicare Other | Source: Ambulatory Visit | Attending: General Surgery | Admitting: General Surgery

## 2020-07-31 ENCOUNTER — Other Ambulatory Visit: Payer: Self-pay

## 2020-07-31 DIAGNOSIS — R921 Mammographic calcification found on diagnostic imaging of breast: Secondary | ICD-10-CM | POA: Diagnosis not present

## 2020-07-31 DIAGNOSIS — C50012 Malignant neoplasm of nipple and areola, left female breast: Secondary | ICD-10-CM | POA: Insufficient documentation

## 2020-07-31 DIAGNOSIS — I480 Paroxysmal atrial fibrillation: Secondary | ICD-10-CM | POA: Diagnosis not present

## 2020-08-06 ENCOUNTER — Other Ambulatory Visit: Payer: Self-pay

## 2020-08-06 ENCOUNTER — Ambulatory Visit
Admission: RE | Admit: 2020-08-06 | Discharge: 2020-08-06 | Disposition: A | Discharge status: Home / Self Care | Payer: Medicare Other | Source: Ambulatory Visit | Attending: Nephrology | Admitting: Nephrology

## 2020-08-06 DIAGNOSIS — E876 Hypokalemia: Secondary | ICD-10-CM

## 2020-08-06 DIAGNOSIS — I10 Essential (primary) hypertension: Secondary | ICD-10-CM | POA: Insufficient documentation

## 2020-08-06 DIAGNOSIS — R809 Proteinuria, unspecified: Secondary | ICD-10-CM

## 2020-08-06 DIAGNOSIS — N1831 Chronic kidney disease, stage 3a: Secondary | ICD-10-CM | POA: Diagnosis not present

## 2020-08-06 NOTE — Telephone Encounter (Signed)
resolved 

## 2020-08-08 ENCOUNTER — Other Ambulatory Visit: Payer: Self-pay | Admitting: Family Medicine

## 2020-08-08 DIAGNOSIS — G8929 Other chronic pain: Secondary | ICD-10-CM

## 2020-08-10 ENCOUNTER — Encounter
Admission: RE | Admit: 2020-08-10 | Discharge: 2020-08-10 | Disposition: A | Discharge status: Home / Self Care | Payer: Medicare Other | Source: Ambulatory Visit | Attending: General Surgery | Admitting: General Surgery

## 2020-08-10 ENCOUNTER — Other Ambulatory Visit: Payer: Self-pay

## 2020-08-10 HISTORY — DX: Chronic kidney disease, unspecified: N18.9

## 2020-08-10 NOTE — Patient Instructions (Addendum)
Your procedure is scheduled on: 08/17/20 Report to [place] after checking in at the Admitting Desk. To find out your arrival time please call [phone] between 1PM - 3PM on 08/16/20.  Remember: Instructions that are not followed completely may result in serious medical risk, up to and including death, or upon the discretion of your surgeon and anesthesiologist your surgery may need to be rescheduled.     _X__ 1. Do not eat food after midnight the night before your procedure.                 No gum chewing or hard candies. You may drink clear liquids up to 2 hours                 before you are scheduled to arrive for your surgery- DO not drink clear                 liquids within 2 hours of the start of your surgery.                 Clear Liquids include:  water, apple juice without pulp, clear carbohydrate                 drink such as Clearfast or Gatorade, Black Coffee or Tea (Do not add                 anything to coffee or tea). Diabetics water only  __X__2.  On the morning of surgery brush your teeth with toothpaste and water, you                 may rinse your mouth with mouthwash if you wish.  Do not swallow any              toothpaste of mouthwash.     _X__ 3.  No Alcohol for 24 hours before or after surgery.   _X__ 4.  Do Not Smoke or use e-cigarettes For 24 Hours Prior to Your Surgery.                 Do not use any chewable tobacco products for at least 6 hours prior to                 surgery.  ____  5.  Bring all medications with you on the day of surgery if instructed.   __X__  6.  Notify your doctor if there is any change in your medical condition      (cold, fever, infections).     Do not wear jewelry, make-up, hairpins, clips or nail polish. Do not wear lotions, powders, or perfumes.  Do not shave 48 hours prior to surgery. Men may shave face and neck. Do not bring valuables to the hospital.    [facility]  is not responsible for any belongings or valuables.  Contacts, dentures/partials or body piercings may not be worn into surgery. Bring a case for your contacts, glasses or hearing aids, a denture cup will be supplied. Leave your suitcase in the car. After surgery it may be brought to your room. For patients admitted to the hospital, discharge time is determined by your treatment team.   Patients discharged the day of surgery will not be allowed to drive home.   Please read over the following fact sheets that you were given:    __X__ Take these medicines the morning of surgery with A  SIP OF WATER:    1. metoprolol tartrate (LOPRESSOR) 25 MG tablet  2. ezetimibe (ZETIA) 10 MG tablet  3. allopurinol (ZYLOPRIM) 300 MG tablet  4.  5.  6.  ____ Fleet Enema (as directed)   __X__ Use CHG Soap/SAGE wipes as directed  __X__ Use inhalers on the day of surgery  USE YOUR SPIRIVA INHALER BEFORE ARRIVING  ____ Stop metformin/Janumet/Farxiga 2 days prior to surgery    ____ Take 1/2 of usual insulin dose the night before surgery. No insulin the morning          of surgery.   ____ Stop Blood Thinners Coumadin/Plavix/Xarelto/Pleta/Pradaxa/Eliquis/Effient/Aspirin  on   Or contact your Surgeon, Cardiologist or Medical Doctor regarding  ability to stop your blood thinners  __X__ Stop Anti-inflammatories 7 days before surgery such as Advil, Ibuprofen, Motrin,  BC or Goodies Powder, Naprosyn, Naproxen, Aleve, Aspirin    __X__ Stop all herbal supplements, fish oil or vitamin E until after surgery.    ____ Bring C-Pap to the hospital.    [place] [facility] TODAY 08/10/20  INSTRUCTIONS PER DR BYRNETT'S OFFICE NOTES ARE TO CONTINUE TAKING YOUR COUMADIN/WARFARIN (do not take on the day of your surgery)

## 2020-08-13 ENCOUNTER — Other Ambulatory Visit: Payer: Self-pay

## 2020-08-13 ENCOUNTER — Encounter: Payer: Self-pay | Admitting: General Surgery

## 2020-08-13 ENCOUNTER — Encounter
Admission: RE | Admit: 2020-08-13 | Discharge: 2020-08-13 | Disposition: A | Discharge status: Home / Self Care | Payer: Medicare Other | Source: Ambulatory Visit | Attending: General Surgery | Admitting: General Surgery

## 2020-08-13 DIAGNOSIS — I10 Essential (primary) hypertension: Secondary | ICD-10-CM | POA: Insufficient documentation

## 2020-08-13 DIAGNOSIS — I4891 Unspecified atrial fibrillation: Secondary | ICD-10-CM | POA: Insufficient documentation

## 2020-08-13 DIAGNOSIS — J449 Chronic obstructive pulmonary disease, unspecified: Secondary | ICD-10-CM | POA: Insufficient documentation

## 2020-08-13 DIAGNOSIS — Z01818 Encounter for other preprocedural examination: Secondary | ICD-10-CM | POA: Diagnosis not present

## 2020-08-13 DIAGNOSIS — Z954 Presence of other heart-valve replacement: Secondary | ICD-10-CM | POA: Diagnosis not present

## 2020-08-13 DIAGNOSIS — I480 Paroxysmal atrial fibrillation: Secondary | ICD-10-CM | POA: Diagnosis not present

## 2020-08-13 LAB — PROTIME-INR
INR: 2.3 — ABNORMAL HIGH (ref 0.8–1.2)
Prothrombin Time: 24.8 seconds — ABNORMAL HIGH (ref 11.4–15.2)

## 2020-08-13 LAB — POTASSIUM: Potassium: 3.6 mmol/L (ref 3.5–5.1)

## 2020-08-13 NOTE — Progress Notes (Signed)
[facility] Perioperative Services  Pre-Admission/Anesthesia Testing Clinical Review  Date: 08/14/20  Patient Demographics:  Name: [name] DOB:   1941/02/02 MRN:   [id number]  Planned Surgical Procedure(s):    Case: [id number] Date/Time: 08/17/20 1355   Procedure: BREAST LUMPECTOMY (Left )   Anesthesia type: General   Pre-op diagnosis: pagets disease   Location: [facility] OR ROOM 08 / [place] ORS FOR ANESTHESIA GROUP   Surgeons: Robert Bellow, MD     NOTE: Available PAT nursing documentation and vital signs have been reviewed. Clinical nursing staff has updated patient's PMH/PSHx, current medication list, and drug allergies/intolerances to ensure comprehensive history available to assist in medical decision making as it pertains to the aforementioned surgical procedure and anticipated anesthetic course.   Clinical Discussion:  [name] is a 79 y.o. female who is submitted for pre-surgical anesthesia review and clearance prior to her undergoing the above procedure.Patient is a Former Smoker (quit 09/2007). Pertinent PMH includes: CHF, cardiomyopathy, paroxysmal atrial fibrillation (s/p DCCV), murmur, valvular heart disease, HTN, HLD, pulmonary hypertension, COPD, breast cancer.  Patient is followed by cardiology Clayborn Bigness, MD). She was last seen in the cardiology clinic on 04/19/2020; notes reviewed.  At the time of her clinic visit, patient doing "reasonably well" from a cardiovascular perspective.  She denies episodes of angina, patient with mild shortness of breath, however continues to be fairly active.  Patient with a cardiac history significant for rheumatic heart disease as a child.  Patient has had severe aortic stenosis in the past leading to aortic valve replacement and mitral valve repair in 10/2012.  Last TTE performed in 11/2016 revealed mild left ventricular systolic dysfunction with severe LVH; LVEF 40-45%. PMH also positive for paroxysmal atrial  fibrillation (s/p DCCV) for which patient is rate controlled using digoxin.  She has been chronically anticoagulated using warfarin.  Exam revealed a significant cardiac murmur (grade 3/6) and decision was made to repeat TTE.  Blood pressure controlled at 126/78 on currently prescribed beta-blocker and diuretic therapy; digoxin discontinued during this visit.  Patient is on [place] and [place]et[place] for her HLD.  Patient to follow-up with outpatient cardiology in 1 year.   Repeat TTE was performed on 06/01/2020 revealing mild segmental left ventricular systolic dysfunction with a LVEF of 45-50%.  There was moderate tricuspid and mitral valve insufficiency, along with trace aortic valve insufficiency.  No evidence of valvular stenosis (see full interpretation of cardiovascular testing below).  Following a punch biopsy of area of concern in patients LEFT breast, patient is scheduled to undergo a lumpectomy for pathology confirmed Paget's disease; no evidence of invasive carcinoma.  Given patient's past medical history significant for cardiovascular issues, presurgical cardiac clearance was sought by PAT team.  Per cardiology, "this patient is optimized for surgery and may proceed with the planned procedural course with a LOW risk stratification".  Again, this patient is on daily anticoagulation therapy.  In light of her valvular heart disease diagnosis, surgery recommending that patient continue her warfarin throughout the perioperative course.  She denies previous perioperative complications with anesthesia. She underwent a MAC anesthetic course here (ASA III) in 03/2018 with no documented complications.   Vitals with BMI 08/10/2020 06/28/2020 02/20/2020  Height _0  _1  _2   Weight 180 lbs 177 lbs 8 oz 181 lbs 10 oz  BMI 31.89 44.31 54.00  Systolic - 867 619  Diastolic - 80 70  Pulse - 85 81    Providers/Specialists:   NOTE: Primary physician  provider listed below. Patient may have been seen by APP or  partner within same practice.   PROVIDER ROLE LAST Desma Mcgregor, MD General Surgery  07/19/2020  [name], MD Primary Care Provider  06/28/2020  Katrine Coho, MD Cardiology  04/19/2020   Allergies:  Ace inhibitors, Amoxicillin-pot clavulanate, Codeine, Penicillins, Rosuvastatin, and Tetanus-diphtheria toxoids td  Current Home Medications:   No current facility-administered medications for this encounter.   Marland Kitchen acetaminophen (TYLENOL) 500 MG tablet  . allopurinol (ZYLOPRIM) 300 MG tablet  . colchicine 0.6 MG tablet  . Ergocalciferol (VITAMIN D2) 50 MCG (2000 UT) TABS  . ezetimibe (ZETIA) 10 MG tablet  . metoprolol tartrate (LOPRESSOR) 25 MG tablet  . Omega 3 1000 MG CAPS  . potassium chloride SA (KLOR-CON M20) 20 MEQ tablet  . tiotropium (SPIRIVA HANDIHALER) 18 MCG inhalation capsule  . torsemide (DEMADEX) 20 MG tablet  . traZODone (DESYREL) 50 MG tablet  . warfarin (COUMADIN) 5 MG tablet  . clindamycin (CLEOCIN) 300 MG capsule  . Evolocumab (REPATHA) 140 MG/ML SOSY  . fluticasone (FLONASE) 50 MCG/ACT nasal spray   History:   Past Medical History:  Diagnosis Date  . Aortic stenosis    s/p AVR in 10/2012  . Atrial fibrillation [facility])    h/o cardioversion  . Breast cancer ([place]) 1992   positive, radiation  . Cardiomyopathy ([place])   . CHF (congestive heart failure) ([place])   . Chronic kidney disease    Stage III  . COPD (chronic obstructive pulmonary disease) ([place])   . Gout   . Hyperlipidemia   . Hypertension   . Murmur   . Pulmonary HTN ([place])   . Valvular heart disease    s/p AV replacement and MV repair in 10/2012   Past Surgical History:  Procedure Laterality Date  . ABDOMINAL HYSTERECTOMY  1996  . arm surgery  2019  . BREAST BIOPSY Right 1998   neg  . BREAST BIOPSY Right 2007   neg  . BREAST BIOPSY Right 1992   positive  . BREAST EXCISIONAL BIOPSY Left 2000   neg  . BREAST LUMPECTOMY Right 1992  . CARDIAC VALVE REPLACEMENT  2014  .  CATARACT EXTRACTION W/PHACO Left 03/30/2018   Procedure: CATARACT EXTRACTION PHACO AND INTRAOCULAR LENS PLACEMENT (IOC);  Surgeon: Birder Robson, MD;  Location: [facility] ORS;  Service: Ophthalmology;  Laterality: Left;  [place] 00:31.9 AP% 12.1 CDE 3.87 Fluid Pack lot # [id number]  . CESAREAN SECTION    . FRACTURE SURGERY Left    ARM/ LEG  . LEG SURGERY     Family History  Problem Relation Age of Onset  . Cancer Mother   . Diabetes Mother   . Hypertension Mother   . Breast cancer Mother 38  . Hypertension Father   . Heart failure Father   . Cancer Brother        bladder  . Cancer Sister        breast  . Breast cancer Sister 66  . Hypertension Son   . Hypertension Daughter   . Breast cancer Sister 59   Social History   Tobacco Use  . Smoking status: Former Smoker    Quit date: 09/09/2007    Years since quitting: 12.9  . Smokeless tobacco: Never Used  Vaping Use  . Vaping Use: Never used  Substance Use Topics  . Alcohol use: No    Alcohol/week: 0.0 standard drinks  . Drug use: No    Pertinent Clinical Results:  LABS:  Labs reviewed: Acceptable for surgery.  K+ low at 2.9 mmol/L on 06/28/2020 labs. Repeated on 08/13/2020 and result had normalized to 3.6 mmol/L.      ECG: Date: 08/13/2020 Time ECG obtained: 1353 PM Rate: 80 bpm Rhythm: atrial fibrillation; LVH with QRS widening and repolarization abnormality Intervals: QRS 128 ms. QTc 419 ms. ST segment and T wave changes: No evidence of acute ST segment elevation or depression Comparison: Similar to previous tracing obtained on 08/01/2017   IMAGING / PROCEDURES: ECHOCARDIOGRAM done on 06/01/2020 1. LVEF 40-45% 2. Mild segmental left ventricular systolic dysfunction 3. Normal right ventricular systolic function 4. Trace aortic valve insufficiency 5. Moderate tricuspid and mitral valve insufficiency 6. No valvular stenosis  7. Mild right ventricular enlargement 8. Mild biatrial enlargement 9. Moderate  LVH 10. Status post mitral valve repair 11. Status post aortic valve replacement 12. Aortic valve bioprosthesis appears to be well-seated   Impression and Plan:  [name] has been referred for pre-anesthesia review and clearance prior to her undergoing the planned anesthetic and procedural courses. Available labs, pertinent testing, and imaging results were personally reviewed by me. This patient has been appropriately cleared by cardiology.   Based on clinical review performed today (08/14/20), barring any significant acute changes in the patient's overall condition, it is anticipated that she will be able to proceed with the planned surgical intervention. Any acute changes in clinical condition may necessitate her procedure being postponed and/or cancelled. Pre-surgical instructions were reviewed with the patient during her PAT appointment and questions were fielded by PAT clinical staff.  Honor Loh, MSN, APRN, FNP-C, CEN [facility]  Peri-operative Services Nurse Practitioner Phone: [phone] 08/14/20 2:44 PM  NOTE: This note has been prepared using Dragon dictation software. Despite my best ability to proofread, there is always the potential that unintentional transcriptional errors may still occur from this process.

## 2020-08-14 ENCOUNTER — Other Ambulatory Visit: Payer: Self-pay | Admitting: General Surgery

## 2020-08-14 DIAGNOSIS — C50012 Malignant neoplasm of nipple and areola, left female breast: Secondary | ICD-10-CM

## 2020-08-16 ENCOUNTER — Other Ambulatory Visit: Payer: Self-pay

## 2020-08-16 ENCOUNTER — Other Ambulatory Visit
Admission: RE | Admit: 2020-08-16 | Discharge: 2020-08-16 | Disposition: A | Discharge status: Home / Self Care | Payer: Medicare Other | Source: Ambulatory Visit | Attending: General Surgery | Admitting: General Surgery

## 2020-08-16 DIAGNOSIS — Z20822 Contact with and (suspected) exposure to covid-19: Secondary | ICD-10-CM | POA: Diagnosis not present

## 2020-08-16 DIAGNOSIS — Z01812 Encounter for preprocedural laboratory examination: Secondary | ICD-10-CM | POA: Diagnosis not present

## 2020-08-17 ENCOUNTER — Other Ambulatory Visit: Payer: Self-pay

## 2020-08-17 ENCOUNTER — Encounter
Admission: RE | Disposition: A | Discharge status: Home / Self Care | Payer: Self-pay | Source: Home / Self Care | Attending: General Surgery

## 2020-08-17 ENCOUNTER — Ambulatory Visit: Payer: Medicare Other | Admitting: Urgent Care

## 2020-08-17 ENCOUNTER — Ambulatory Visit
Admission: RE | Admit: 2020-08-17 | Discharge: 2020-08-17 | Disposition: A | Discharge status: Home / Self Care | Payer: Medicare Other | Attending: General Surgery | Admitting: General Surgery

## 2020-08-17 ENCOUNTER — Ambulatory Visit
Admission: RE | Admit: 2020-08-17 | Discharge: 2020-08-17 | Disposition: A | Discharge status: Home / Self Care | Payer: Medicare Other | Source: Ambulatory Visit | Attending: General Surgery | Admitting: General Surgery

## 2020-08-17 ENCOUNTER — Encounter: Payer: Self-pay | Admitting: General Surgery

## 2020-08-17 DIAGNOSIS — Z87891 Personal history of nicotine dependence: Secondary | ICD-10-CM | POA: Diagnosis not present

## 2020-08-17 DIAGNOSIS — I130 Hypertensive heart and chronic kidney disease with heart failure and stage 1 through stage 4 chronic kidney disease, or unspecified chronic kidney disease: Secondary | ICD-10-CM | POA: Diagnosis not present

## 2020-08-17 DIAGNOSIS — Z7901 Long term (current) use of anticoagulants: Secondary | ICD-10-CM | POA: Insufficient documentation

## 2020-08-17 DIAGNOSIS — Z952 Presence of prosthetic heart valve: Secondary | ICD-10-CM | POA: Diagnosis not present

## 2020-08-17 DIAGNOSIS — Z880 Allergy status to penicillin: Secondary | ICD-10-CM | POA: Insufficient documentation

## 2020-08-17 DIAGNOSIS — M889 Osteitis deformans of unspecified bone: Secondary | ICD-10-CM | POA: Diagnosis not present

## 2020-08-17 DIAGNOSIS — Z885 Allergy status to narcotic agent status: Secondary | ICD-10-CM | POA: Diagnosis not present

## 2020-08-17 DIAGNOSIS — D0512 Intraductal carcinoma in situ of left breast: Secondary | ICD-10-CM | POA: Diagnosis not present

## 2020-08-17 DIAGNOSIS — C50012 Malignant neoplasm of nipple and areola, left female breast: Secondary | ICD-10-CM

## 2020-08-17 DIAGNOSIS — N189 Chronic kidney disease, unspecified: Secondary | ICD-10-CM | POA: Diagnosis not present

## 2020-08-17 DIAGNOSIS — Z79899 Other long term (current) drug therapy: Secondary | ICD-10-CM | POA: Diagnosis not present

## 2020-08-17 DIAGNOSIS — I5042 Chronic combined systolic (congestive) and diastolic (congestive) heart failure: Secondary | ICD-10-CM | POA: Diagnosis not present

## 2020-08-17 DIAGNOSIS — E559 Vitamin D deficiency, unspecified: Secondary | ICD-10-CM | POA: Diagnosis not present

## 2020-08-17 HISTORY — DX: Pulmonary hypertension, unspecified: I27.20

## 2020-08-17 HISTORY — DX: Nonrheumatic aortic (valve) stenosis: I35.0

## 2020-08-17 HISTORY — DX: Endocarditis, valve unspecified: I38

## 2020-08-17 HISTORY — PX: BREAST LUMPECTOMY: SHX2

## 2020-08-17 HISTORY — DX: Cardiomyopathy, unspecified: I42.9

## 2020-08-17 HISTORY — DX: Cardiac murmur, unspecified: R01.1

## 2020-08-17 LAB — SARS CORONAVIRUS 2 (TAT 6-24 HRS): SARS Coronavirus 2: NEGATIVE

## 2020-08-17 SURGERY — BREAST LUMPECTOMY
Anesthesia: General | Laterality: Left

## 2020-08-17 MED ORDER — HYDROCODONE-ACETAMINOPHEN 5-325 MG PO TABS: 1.0000 | ORAL_TABLET | ORAL | 0 refills | Status: DC | PRN

## 2020-08-17 MED ORDER — BUPIVACAINE-EPINEPHRINE (PF) 0.5% -1:200000 IJ SOLN
INTRAMUSCULAR | Status: AC
  Filled 2020-08-17: qty 30

## 2020-08-17 MED ORDER — ACETAMINOPHEN 10 MG/ML IV SOLN
INTRAVENOUS | Status: DC | PRN
  Administered 2020-08-17: 1000 mg via INTRAVENOUS

## 2020-08-17 MED ORDER — ONDANSETRON HCL 4 MG/2ML IJ SOLN
INTRAMUSCULAR | Status: AC
  Filled 2020-08-17: qty 2

## 2020-08-17 MED ORDER — FAMOTIDINE 20 MG PO TABS
ORAL_TABLET | ORAL | Status: AC
  Administered 2020-08-17: 20 mg via ORAL
  Filled 2020-08-17: qty 1

## 2020-08-17 MED ORDER — CHLORHEXIDINE GLUCONATE 0.12 % MT SOLN: 15.0000 mL | Freq: Once | OROMUCOSAL | Status: AC

## 2020-08-17 MED ORDER — CHLORHEXIDINE GLUCONATE 0.12 % MT SOLN
OROMUCOSAL | Status: AC
  Administered 2020-08-17: 15 mL via OROMUCOSAL
  Filled 2020-08-17: qty 15

## 2020-08-17 MED ORDER — FENTANYL CITRATE (PF) 100 MCG/2ML IJ SOLN
INTRAMUSCULAR | Status: AC
  Filled 2020-08-17: qty 2

## 2020-08-17 MED ORDER — LIDOCAINE HCL (CARDIAC) PF 100 MG/5ML IV SOSY
PREFILLED_SYRINGE | INTRAVENOUS | Status: DC | PRN
  Administered 2020-08-17: 100 mg via INTRAVENOUS

## 2020-08-17 MED ORDER — ONDANSETRON HCL 4 MG/2ML IJ SOLN
INTRAMUSCULAR | Status: DC | PRN
  Administered 2020-08-17: 4 mg via INTRAVENOUS

## 2020-08-17 MED ORDER — ORAL CARE MOUTH RINSE: 15.0000 mL | Freq: Once | OROMUCOSAL | Status: AC

## 2020-08-17 MED ORDER — DEXAMETHASONE SODIUM PHOSPHATE 10 MG/ML IJ SOLN
INTRAMUSCULAR | Status: AC
  Filled 2020-08-17: qty 1

## 2020-08-17 MED ORDER — ONDANSETRON HCL 4 MG/2ML IJ SOLN: 4.0000 mg | Freq: Once | INTRAMUSCULAR | Status: DC | PRN

## 2020-08-17 MED ORDER — EPHEDRINE SULFATE 50 MG/ML IJ SOLN
INTRAMUSCULAR | Status: DC | PRN
  Administered 2020-08-17 (×3): 10 mg via INTRAVENOUS

## 2020-08-17 MED ORDER — ONDANSETRON 4 MG PO TBDP: 4.0000 mg | ORAL_TABLET | Freq: Three times a day (TID) | ORAL | 0 refills | Status: DC | PRN

## 2020-08-17 MED ORDER — FAMOTIDINE 20 MG PO TABS: 20.0000 mg | ORAL_TABLET | Freq: Once | ORAL | Status: AC

## 2020-08-17 MED ORDER — PROPOFOL 10 MG/ML IV BOLUS
INTRAVENOUS | Status: AC
  Filled 2020-08-17: qty 20

## 2020-08-17 MED ORDER — CEFAZOLIN SODIUM-DEXTROSE 2-4 GM/100ML-% IV SOLN
INTRAVENOUS | Status: AC
  Filled 2020-08-17: qty 100

## 2020-08-17 MED ORDER — PROPOFOL 10 MG/ML IV BOLUS
INTRAVENOUS | Status: DC | PRN
  Administered 2020-08-17: 140 mg via INTRAVENOUS

## 2020-08-17 MED ORDER — GLYCOPYRROLATE 0.2 MG/ML IJ SOLN
INTRAMUSCULAR | Status: AC
  Filled 2020-08-17: qty 1

## 2020-08-17 MED ORDER — ACETAMINOPHEN 10 MG/ML IV SOLN
INTRAVENOUS | Status: AC
  Filled 2020-08-17: qty 100

## 2020-08-17 MED ORDER — DEXAMETHASONE SODIUM PHOSPHATE 10 MG/ML IJ SOLN
INTRAMUSCULAR | Status: DC | PRN
  Administered 2020-08-17: 10 mg via INTRAVENOUS

## 2020-08-17 MED ORDER — FENTANYL CITRATE (PF) 100 MCG/2ML IJ SOLN
INTRAMUSCULAR | Status: DC | PRN
  Administered 2020-08-17 (×2): 25 ug via INTRAVENOUS

## 2020-08-17 MED ORDER — LACTATED RINGERS IV SOLN: INTRAVENOUS | Status: DC

## 2020-08-17 MED ORDER — BUPIVACAINE-EPINEPHRINE (PF) 0.5% -1:200000 IJ SOLN
INTRAMUSCULAR | Status: DC | PRN
  Administered 2020-08-17: 30 mL via PERINEURAL

## 2020-08-17 MED ORDER — LIDOCAINE HCL (PF) 2 % IJ SOLN
INTRAMUSCULAR | Status: AC
  Filled 2020-08-17: qty 5

## 2020-08-17 MED ORDER — FENTANYL CITRATE (PF) 100 MCG/2ML IJ SOLN
25.0000 ug | INTRAMUSCULAR | Status: DC | PRN
  Administered 2020-08-17: 25 ug via INTRAVENOUS

## 2020-08-17 MED ORDER — CEFAZOLIN SODIUM-DEXTROSE 2-4 GM/100ML-% IV SOLN
2.0000 g | INTRAVENOUS | Status: AC
  Administered 2020-08-17: 2 g via INTRAVENOUS

## 2020-08-17 MED ORDER — EPHEDRINE 5 MG/ML INJ
INTRAVENOUS | Status: AC
  Filled 2020-08-17: qty 10

## 2020-08-17 SURGICAL SUPPLY — 54 items
APL PRP STRL LF DISP 70% ISPRP (MISCELLANEOUS) ×1
BINDER BREAST LRG (GAUZE/BANDAGES/DRESSINGS) IMPLANT
BINDER BREAST MEDIUM (GAUZE/BANDAGES/DRESSINGS) IMPLANT
BINDER BREAST XLRG (GAUZE/BANDAGES/DRESSINGS) ×2 IMPLANT
BINDER BREAST XXLRG (GAUZE/BANDAGES/DRESSINGS) IMPLANT
BLADE PHOTON ILLUMINATED (MISCELLANEOUS) IMPLANT
BLADE SURG 15 STRL SS SAFETY (BLADE) ×2 IMPLANT
BULB RESERV EVAC DRAIN JP 100C (MISCELLANEOUS) IMPLANT
CANISTER SUCT 1200ML W/VALVE (MISCELLANEOUS) ×2 IMPLANT
CHLORAPREP W/TINT 26 (MISCELLANEOUS) ×2 IMPLANT
CNTNR SPEC 2.5X3XGRAD LEK (MISCELLANEOUS)
CONT SPEC 4OZ STER OR WHT (MISCELLANEOUS)
CONT SPEC 4OZ STRL OR WHT (MISCELLANEOUS)
CONTAINER SPEC 2.5X3XGRAD LEK (MISCELLANEOUS) IMPLANT
COVER PROBE FLX POLY STRL (MISCELLANEOUS) IMPLANT
COVER WAND RF STERILE (DRAPES) ×2 IMPLANT
DEVICE DUBIN SPECIMEN MAMMOGRA (MISCELLANEOUS) IMPLANT
DRAIN CHANNEL JP 15F RND 16 (MISCELLANEOUS) IMPLANT
DRAPE LAPAROTOMY TRNSV 106X77 (MISCELLANEOUS) ×2 IMPLANT
DRSG GAUZE FLUFF 36X18 (GAUZE/BANDAGES/DRESSINGS) ×4 IMPLANT
DRSG OPSITE POSTOP 4X8 (GAUZE/BANDAGES/DRESSINGS) ×2 IMPLANT
DRSG TELFA 3X8 NADH (GAUZE/BANDAGES/DRESSINGS) ×2 IMPLANT
ELECT CAUTERY BLADE TIP 2.5 (TIP)
ELECT REM PT RETURN 9FT ADLT (ELECTROSURGICAL) ×2
ELECTRODE CAUTERY BLDE TIP 2.5 (TIP) IMPLANT
ELECTRODE REM PT RTRN 9FT ADLT (ELECTROSURGICAL) ×1 IMPLANT
GLOVE BIO SURGEON STRL SZ7.5 (GLOVE) ×4 IMPLANT
GLOVE INDICATOR 8.0 STRL GRN (GLOVE) ×2 IMPLANT
GOWN STRL REUS W/ TWL LRG LVL3 (GOWN DISPOSABLE) ×2 IMPLANT
GOWN STRL REUS W/TWL LRG LVL3 (GOWN DISPOSABLE) ×4
KIT TURNOVER KIT A (KITS) ×2 IMPLANT
LABEL OR SOLS (LABEL) ×2 IMPLANT
MANIFOLD NEPTUNE II (INSTRUMENTS) ×2 IMPLANT
MARGIN MAP 10MM (MISCELLANEOUS) ×2 IMPLANT
NEEDLE HYPO 22GX1.5 SAFETY (NEEDLE) IMPLANT
NEEDLE HYPO 25X1 1.5 SAFETY (NEEDLE) ×2 IMPLANT
PACK BASIN MINOR (MISCELLANEOUS) ×2 IMPLANT
SHEARS FOC LG CVD HARMONIC 17C (MISCELLANEOUS) IMPLANT
SHEARS HARMONIC 9CM CVD (BLADE) IMPLANT
STRIP CLOSURE SKIN 1/2X4 (GAUZE/BANDAGES/DRESSINGS) ×2 IMPLANT
SUT ETHILON 3-0 FS-10 30 BLK (SUTURE) ×2
SUT SILK 2 0 (SUTURE) ×2
SUT SILK 2-0 18XBRD TIE 12 (SUTURE) ×1 IMPLANT
SUT VIC AB 2-0 CT1 27 (SUTURE) ×6
SUT VIC AB 2-0 CT1 TAPERPNT 27 (SUTURE) ×3 IMPLANT
SUT VIC AB 4-0 FS2 27 (SUTURE) ×2 IMPLANT
SUT VICRYL+ 3-0 144IN (SUTURE) IMPLANT
SUTURE EHLN 3-0 FS-10 30 BLK (SUTURE) ×1 IMPLANT
SWABSTK COMLB BENZOIN TINCTURE (MISCELLANEOUS) ×2 IMPLANT
SYR 10ML LL (SYRINGE) ×2 IMPLANT
SYR BULB IRRIG 60ML STRL (SYRINGE) ×2 IMPLANT
SYR CONTROL 10ML LL (SYRINGE) IMPLANT
TAPE TRANSPORE STRL 2 31045 (GAUZE/BANDAGES/DRESSINGS) IMPLANT
WATER STERILE IRR 1000ML POUR (IV SOLUTION) ×2 IMPLANT

## 2020-08-17 NOTE — OR Nursing (Addendum)
May resume taking warfarin today per Dr. Bary Castilla, secure chat - added to d/c instructions/med section. (was already on d/c intructons)  Also, may discharge pt to home without his visit to postop.

## 2020-08-17 NOTE — Anesthesia Procedure Notes (Signed)
Procedure Name: LMA Insertion Date/Time: 08/17/2020 2:12 PM Performed by: Doreen Salvage, CRNA Pre-anesthesia Checklist: Patient identified, Patient being monitored, Timeout performed, Emergency Drugs available and Suction available Patient Re-evaluated:Patient Re-evaluated prior to induction Oxygen Delivery Method: Circle system utilized Preoxygenation: Pre-oxygenation with 100% oxygen Induction Type: IV induction Ventilation: Mask ventilation without difficulty LMA: LMA inserted LMA Size: 4.0 Tube type: Oral Number of attempts: 1 Placement Confirmation: positive ETCO2 and breath sounds checked- equal and bilateral Tube secured with: Tape Dental Injury: Teeth and Oropharynx as per pre-operative assessment

## 2020-08-17 NOTE — Discharge Instructions (Signed)

## 2020-08-17 NOTE — Transfer of Care (Signed)
Immediate Anesthesia Transfer of Care Note  Patient: [name]  Procedure(s) Performed: Procedure(s): BREAST LUMPECTOMY (Left)  Patient Location: PACU  Anesthesia Type:General  Level of Consciousness: sedated  Airway & Oxygen Therapy: Patient Spontanous Breathing and Patient connected to face mask oxygen  Post-op Assessment: Report given to RN and Post -op Vital signs reviewed and stable  Post vital signs: Reviewed and stable  Last Vitals:  Vitals:   08/17/20 1328 08/17/20 1459  BP: (!) 148/87 (!) 141/77  Pulse: 84 81  Resp:  17  Temp: (!) 36.1 C   SpO2: 90% 379%    Complications: No apparent anesthesia complications

## 2020-08-17 NOTE — Anesthesia Preprocedure Evaluation (Signed)
Anesthesia Evaluation  Patient identified by MRN, date of birth, ID band Patient awake    Reviewed: Allergy & Precautions, NPO status , Patient's Chart, lab work & pertinent test results  History of Anesthesia Complications Negative for: history of anesthetic complications  Airway Mallampati: II  TM Distance: >3 FB Neck ROM: Full    Dental  (+) Poor Dentition, Chipped, Dental Advidsory Given   Pulmonary neg shortness of breath, neg sleep apnea, COPD,  COPD inhaler, neg recent URI, former smoker,    breath sounds clear to auscultation- rhonchi (-) wheezing      Cardiovascular hypertension, Pt. on medications (-) angina+ Peripheral Vascular Disease and +CHF  (-) CAD, (-) Past MI, (-) Cardiac Stents and (-) CABG + Valvular Problems/Murmurs (s/p AVR)  Rhythm:Regular Rate:Normal - Systolic murmurs and - Diastolic murmurs Echo 04/03/19: MILD LV SYSTOLIC DYSFUNCTION (See above) WITH SEVERE LVH NORMAL RIGHT VENTRICULAR SYSTOLIC FUNCTION MODERATE VALVULAR REGURGITATION (See above) NO VALVULAR STENOSIS MODERATE PHTN EF40-45%   Neuro/Psych  Headaches, Anxiety    GI/Hepatic negative GI ROS, Neg liver ROS,   Endo/Other  negative endocrine ROSneg diabetes  Renal/GU CRFRenal disease     Musculoskeletal  (+) Arthritis ,   Abdominal (+) + obese,   Peds  Hematology negative hematology ROS (+)   Anesthesia Other Findings Past Medical History: No date: Atrial fibrillation ([place]) 1992: Breast cancer ([place])     Comment:  positive, radiation No date: CHF (congestive heart failure) (HCC) No date: COPD (chronic obstructive pulmonary disease) (H[facility]) No date: Dysrhythmia No date: Gout No date: History of aortic valve replacement No date: Hyperlipidemia No date: Hypertension   Reproductive/Obstetrics                             Anesthesia Physical  Anesthesia Plan  ASA: III  Anesthesia Plan: General    Post-op Pain Management:    Induction: Intravenous  PONV Risk Score and Plan: 2 and Ondansetron, Dexamethasone and Treatment may vary due to age or medical condition  Airway Management Planned: LMA  Additional Equipment:   Intra-op Plan:   Post-operative Plan: Extubation in OR  Informed Consent: I have reviewed the patients History and Physical, chart, labs and discussed the procedure including the risks, benefits and alternatives for the proposed anesthesia with the patient or authorized representative who has indicated his/her understanding and acceptance.       Plan Discussed with: CRNA and Anesthesiologist  Anesthesia Plan Comments:         Anesthesia Quick Evaluation

## 2020-08-17 NOTE — Op Note (Signed)
Preoperative diagnosis: Paget's disease of the left nipple.  Postoperative diagnosis same.  Operative procedure: Excision of left nipple areolar complex in the retroareolar ductal structures.  Operating surgeon: Hervey Ard, MD.  Anesthesia: General by LMA, Marcaine 0.5% with 1: 200,000 units of epinephrine, 30 cc.  Estimated blood loss: Less than 5 cc.  Clinical note: This 79 year old woman is was seen with a change in her nipple.  Biopsy showed evidence of Paget's disease.  Mammograms were unremarkable.  She is felt to be a candidate for excision of the nipple areolar complex.  She has mechanical valve it was and it was elected to not discontinue her Coumadin prior to surgery.  She received Ancef prior to the procedure.   Operative note: With the patient under adequate general anesthesia the area was cleansed with ChloraPrep and draped.  A 6 x 12 cm ellipse was outlined staying 1 cm outside the edge of the areola.  The skin was incised sharply and remaining dissection completed with electrocautery.  The entire nipple areolar complex as well as the deep ductal structures to a depth of 4 cm were resected.  Specimen was orientated and sent in formalin for routine histology.  The wound was closed in multiple layers with interrupted 2-0 Vicryl figure-of-eight sutures.  The skin was closed with a running 4-0 Vicryl subcuticular suture.  Benzoin, Steri-Strips, and a honeycomb dressing were applied followed by compressive wrap.  The patient tolerated the procedure well and was taken to recovery in stable condition.

## 2020-08-17 NOTE — H&P (Signed)
[name] [id number] 1941/06/13     HPI: 79 year old woman with recently identified Paget's disease involving the left nipple.  Admitted for planned resection of the nipple areolar complex.  She has a mechanical valve and she has been maintained on her regular dose of Coumadin.  Medications Prior to Admission  Medication Sig Dispense Refill Last Dose  . acetaminophen (TYLENOL) 500 MG tablet Take 1,000 mg by mouth every 6 (six) hours as needed for mild pain or moderate pain.      Marland Kitchen allopurinol (ZYLOPRIM) 300 MG tablet Take 1 tablet (300 mg total) by mouth daily. 90 tablet 1   . colchicine 0.6 MG tablet Take 1-2 tablets (0.6-1.2 mg total) by mouth daily. And repeat in one hour for gout attacks prn (Patient taking differently: Take 0.6-1.2 mg by mouth daily as needed (And repeat in one hour for gout attacks prn). ) 30 tablet 0   . Ergocalciferol (VITAMIN D2) 50 MCG (2000 UT) TABS Take 2,000 Units by mouth daily.     Marland Kitchen ezetimibe (ZETIA) 10 MG tablet TAKE 1 TABLET BY MOUTH EVERY DAY (Patient taking differently: Take 10 mg by mouth daily. ) 90 tablet 3   . metoprolol tartrate (LOPRESSOR) 25 MG tablet Take 1 tablet (25 mg total) by mouth 2 (two) times daily. 180 tablet 1   . Omega 3 1000 MG CAPS Take 1,000 mg by mouth daily.     . potassium chloride SA (KLOR-CON M20) 20 MEQ tablet Take 1 tablet (20 mEq total) by mouth 2 (two) times daily. 180 tablet 1   . tiotropium (SPIRIVA HANDIHALER) 18 MCG inhalation capsule INHALE 1 CASPULE VIA HANDIHALER ONCE A DAY AT THE SAME TIME EVERY DAY (Patient taking differently: Place 18 mcg into inhaler and inhale daily. ) 90 capsule 1   . torsemide (DEMADEX) 20 MG tablet TAKE 1 TABLET BY MOUTH EVERY DAY (Patient taking differently: Take 20 mg by mouth daily. ) 90 tablet 1   . traZODone (DESYREL) 50 MG tablet Take 1 tablet (50 mg total) by mouth at bedtime. (Patient taking differently: Take 50 mg by mouth at bedtime as needed. ) 90 tablet 1   . warfarin (COUMADIN) 5  MG tablet Take 5-7.5 mg by mouth See admin instructions. Take 5 mg on Mon., Wed., Sunday. All the other days take 7.5 mg     . clindamycin (CLEOCIN) 300 MG capsule Take 300 mg by mouth once.      . Evolocumab (REPATHA) 140 MG/ML SOSY Inject 1 mL into the skin every 14 (fourteen) days. 2 mL 5   . fluticasone (FLONASE) 50 MCG/ACT nasal spray Place 2 sprays into both nostrils daily. (Patient not taking: Reported on 08/07/2020) 16 g 6 Not Taking at Unknown time   Allergies  Allergen Reactions  . Ace Inhibitors Cough and Other (See Comments)  . Amoxicillin-Pot Clavulanate Nausea And Vomiting  . Codeine Nausea And Vomiting  . Penicillins Nausea And Vomiting and Other (See Comments)    Has patient had a PCN reaction causing immediate rash, facial/tongue/throat swelling, SOB or lightheadedness with hypotension: No Has patient had a PCN reaction causing severe rash involving mucus membranes or skin necrosis: No Has patient had a PCN reaction that required hospitalization No Has patient had a PCN reaction occurring within the last 10 years: No If all of the above answers are "NO", then may proceed with Cephalosporin use.  . Rosuvastatin Other (See Comments)    Reaction:  Joint pain   . Tetanus-Diphtheria Toxoids  Td Swelling   Past Medical History:  Diagnosis Date  . Aortic stenosis    s/p AVR in 10/2012  . Atrial fibrillation [facility])    h/o cardioversion  . Breast cancer ([place]) 1992   positive, radiation  . Cardiomyopathy ([place])   . CHF (congestive heart failure) ([place])   . Chronic kidney disease    Stage III  . COPD (chronic obstructive pulmonary disease) ([place])   . Gout   . Hyperlipidemia   . Hypertension   . Murmur   . Pulmonary HTN ([place])   . Valvular heart disease    s/p AV replacement and MV repair in 10/2012   Past Surgical History:  Procedure Laterality Date  . ABDOMINAL HYSTERECTOMY  1996  . arm surgery  2019  . BREAST BIOPSY Right 1998   neg  . BREAST BIOPSY Right 2007   neg   . BREAST BIOPSY Right 1992   positive  . BREAST EXCISIONAL BIOPSY Left 2000   neg  . BREAST LUMPECTOMY Right 1992  . CARDIAC VALVE REPLACEMENT  2014  . CATARACT EXTRACTION W/PHACO Left 03/30/2018   Procedure: CATARACT EXTRACTION PHACO AND INTRAOCULAR LENS PLACEMENT (IOC);  Surgeon: Birder Robson, MD;  Location: [facility] ORS;  Service: Ophthalmology;  Laterality: Left;  [place] 00:31.9 AP% 12.1 CDE 3.87 Fluid Pack lot # [id number]  . CESAREAN SECTION    . FRACTURE SURGERY Left    ARM/ LEG  . LEG SURGERY     Social History   Socioeconomic History  . Marital status: Widowed    Spouse name: Not on file  . Number of children: 2  . Years of education: Not on file  . Highest education level: 12th grade  Occupational History  . Occupation: cafeteria     Comment: [employer]  Tobacco Use  . Smoking status: Former Smoker    Quit date: 09/09/2007    Years since quitting: 12.9  . Smokeless tobacco: Never Used  Vaping Use  . Vaping Use: Never used  Substance and Sexual Activity  . Alcohol use: No    Alcohol/week: 0.0 standard drinks  . Drug use: No  . Sexual activity: Not Currently  Other Topics Concern  . Not on file  Social History Narrative  . Not on file   Social Determinants of Health   Financial Resource Strain: Not on file  Food Insecurity: Not on file  Transportation Needs: Not on file  Physical Activity: Not on file  Stress: Not on file  Social Connections: Not on file  Intimate Partner Violence: Not on file   Social History   Social History Narrative  . Not on file     ROS: Negative.     PE: HEENT: Negative. Lungs: Clear. Cardio: RR.   Assessment/Plan:  Proceed with planned resection of left nipple areolar complex.   [name] 08/17/2020

## 2020-08-20 ENCOUNTER — Encounter: Payer: Self-pay | Admitting: General Surgery

## 2020-08-24 NOTE — Anesthesia Postprocedure Evaluation (Signed)
Anesthesia Post Note  Patient: [name]  Procedure(s) Performed: BREAST LUMPECTOMY (Left )  Patient location during evaluation: PACU Anesthesia Type: General Level of consciousness: awake and alert Pain management: pain level controlled Vital Signs Assessment: post-procedure vital signs reviewed and stable Respiratory status: spontaneous breathing, nonlabored ventilation, respiratory function stable and patient connected to nasal cannula oxygen Cardiovascular status: blood pressure returned to baseline and stable Postop Assessment: no apparent nausea or vomiting Anesthetic complications: no   No complications documented.   Last Vitals:  Vitals:   08/17/20 1530 08/17/20 1542  BP: 140/78 (!) 142/87  Pulse: 71 73  Resp: (!) 97 16  Temp: (!) 36.1 C (!) 36.3 C  SpO2: 95% 99%    Last Pain:  Vitals:   08/20/20 0851  TempSrc:   PainSc: 0-No pain                 Martha Clan

## 2020-08-30 ENCOUNTER — Other Ambulatory Visit: Payer: Self-pay | Admitting: General Surgery

## 2020-08-30 DIAGNOSIS — E200 Idiopathic hypoparathyroidism: Secondary | ICD-10-CM | POA: Diagnosis not present

## 2020-08-30 DIAGNOSIS — C50019 Malignant neoplasm of nipple and areola, unspecified female breast: Secondary | ICD-10-CM | POA: Diagnosis not present

## 2020-09-06 LAB — SURGICAL PATHOLOGY

## 2020-09-11 ENCOUNTER — Other Ambulatory Visit: Payer: Self-pay | Admitting: General Surgery

## 2020-09-11 DIAGNOSIS — C50012 Malignant neoplasm of nipple and areola, left female breast: Secondary | ICD-10-CM

## 2020-09-13 ENCOUNTER — Encounter: Payer: Self-pay | Admitting: Radiation Oncology

## 2020-09-13 ENCOUNTER — Ambulatory Visit
Admission: RE | Admit: 2020-09-13 | Discharge: 2020-09-13 | Disposition: A | Discharge status: Home / Self Care | Payer: Medicare Other | Source: Ambulatory Visit | Attending: Radiation Oncology | Admitting: Radiation Oncology

## 2020-09-13 ENCOUNTER — Other Ambulatory Visit: Payer: Self-pay

## 2020-09-13 VITALS — BP 164/106 | HR 82 | Temp 97.9°F | Wt 184.0 lb

## 2020-09-13 DIAGNOSIS — Z171 Estrogen receptor negative status [ER-]: Secondary | ICD-10-CM | POA: Diagnosis not present

## 2020-09-13 DIAGNOSIS — C50012 Malignant neoplasm of nipple and areola, left female breast: Secondary | ICD-10-CM

## 2020-09-13 DIAGNOSIS — Z803 Family history of malignant neoplasm of breast: Secondary | ICD-10-CM | POA: Diagnosis not present

## 2020-09-13 DIAGNOSIS — Z87891 Personal history of nicotine dependence: Secondary | ICD-10-CM | POA: Diagnosis not present

## 2020-09-13 NOTE — Consult Note (Signed)
NEW PATIENT EVALUATION  Name: [name]  MRN: [id number]  Date:   09/13/2020     DOB: [birth year]   This 80 y.o. female patient presents to the clinic for initial evaluation of DCIS of the left breast and patient identified with Paget's disease involving the left nipple status post wide local excision.  REFERRING PHYSICIAN: Alba Cory, MD  CHIEF COMPLAINT: No chief complaint on file.   DIAGNOSIS: The encounter diagnosis was Paget's disease of left breast (HCC).   PREVIOUS INVESTIGATIONS:  Mammogram and ultrasound reviewed Pathology report reviewed Clinical notes reviewed  HPI: Patient is a 80 year old female previously treated in our department in 1992 for right-sided breast cancer.  She recently presented with nipple discharge of the left breast and was found to have Paget's disease.  She underwent resection of her nipple areolar complex by Dr. Doristine Counter showing ductal carcinoma in situ Paget's disease of the nipple.  Extent of disease was a least 5 mm all margins were negative.  Tumor was ER negative.  She initially had a mammogram and ultrasound which showed no evidence of disease in the left breast.  She otherwise is doing well.  She is recovered nicely from her surgery.  She specifically denies breast tenderness cough or bone pain.  PLANNED TREATMENT REGIMEN: Left whole breast radiation therapy hypofractionated  PAST MEDICAL HISTORY:  has a past medical history of Aortic stenosis, Atrial fibrillation (HCC), Breast cancer (HCC) (1992), Cardiomyopathy (HCC), CHF (congestive heart failure) (HCC), Chronic kidney disease, COPD (chronic obstructive pulmonary disease) (HCC), Gout, Hyperlipidemia, Hypertension, Murmur, Pulmonary HTN (HCC), and Valvular heart disease.    PAST SURGICAL HISTORY:  Past Surgical History:  Procedure Laterality Date  . ABDOMINAL HYSTERECTOMY  1996  . arm surgery  2019  . BREAST BIOPSY Right 1998   neg  . BREAST BIOPSY Right 2007   neg  . BREAST  BIOPSY Right 1992   positive  . BREAST EXCISIONAL BIOPSY Left 2000   neg  . BREAST LUMPECTOMY Right 1992  . BREAST LUMPECTOMY Left 08/17/2020   Procedure: BREAST LUMPECTOMY;  Surgeon: Earline Mayotte, MD;  Location: [facility] ORS;  Service: General;  Laterality: Left;  . CARDIAC VALVE REPLACEMENT  2014  . CATARACT EXTRACTION W/PHACO Left 03/30/2018   Procedure: CATARACT EXTRACTION PHACO AND INTRAOCULAR LENS PLACEMENT (IOC);  Surgeon: Galen Manila, MD;  Location: [facility] ORS;  Service: Ophthalmology;  Laterality: Left;  [place] 00:31.9 AP% 12.1 CDE 3.87 Fluid Pack lot # [id number]  . CESAREAN SECTION    . FRACTURE SURGERY Left    ARM/ LEG  . LEG SURGERY      FAMILY HISTORY: family history includes Breast cancer (age of onset: 23) in her sister; Breast cancer (age of onset: 44) in her sister; Breast cancer (age of onset: 7) in her mother; Cancer in her brother, mother, and sister; Diabetes in her mother; Heart failure in her father; Hypertension in her daughter, father, mother, and son.  SOCIAL HISTORY:  reports that she quit smoking about 13 years ago. She has never used smokeless tobacco. She reports that she does not drink alcohol and does not use drugs.  ALLERGIES: Ace inhibitors, Amoxicillin-pot clavulanate, Codeine, Penicillins, Rosuvastatin, and Tetanus-diphtheria toxoids td  MEDICATIONS:  Current Outpatient Medications  Medication Sig Dispense Refill  . acetaminophen (TYLENOL) 500 MG tablet Take 1,000 mg by mouth every 6 (six) hours as needed for mild pain or moderate pain.     Marland Kitchen colchicine 0.6 MG tablet Take 1-2 tablets (0.6-1.2 mg total) by  mouth daily. And repeat in one hour for gout attacks prn (Patient taking differently: Take 0.6-1.2 mg by mouth daily as needed (And repeat in one hour for gout attacks prn).) 30 tablet 0  . Ergocalciferol (VITAMIN D2) 50 MCG (2000 UT) TABS Take 2,000 Units by mouth daily.    . Evolocumab (REPATHA) 140 MG/ML SOSY Inject 1 mL into the skin every 14  (fourteen) days. 2 mL 5  . ezetimibe (ZETIA) 10 MG tablet TAKE 1 TABLET BY MOUTH EVERY DAY (Patient taking differently: Take 10 mg by mouth daily.) 90 tablet 3  . HYDROcodone-acetaminophen (NORCO/VICODIN) 5-325 MG tablet Take 1 tablet by mouth every 4 (four) hours as needed for moderate pain. 12 tablet 0  . metoprolol tartrate (LOPRESSOR) 25 MG tablet Take 1 tablet (25 mg total) by mouth 2 (two) times daily. 180 tablet 1  . Omega 3 1000 MG CAPS Take 1,000 mg by mouth daily.    . ondansetron (ZOFRAN ODT) 4 MG disintegrating tablet Take 1 tablet (4 mg total) by mouth every 8 (eight) hours as needed for nausea or vomiting. 20 tablet 0  . potassium chloride SA (KLOR-CON M20) 20 MEQ tablet Take 1 tablet (20 mEq total) by mouth 2 (two) times daily. 180 tablet 1  . tiotropium (SPIRIVA HANDIHALER) 18 MCG inhalation capsule INHALE 1 CASPULE VIA HANDIHALER ONCE A DAY AT THE SAME TIME EVERY DAY (Patient taking differently: Place 18 mcg into inhaler and inhale daily.) 90 capsule 1  . torsemide (DEMADEX) 20 MG tablet TAKE 1 TABLET BY MOUTH EVERY DAY (Patient taking differently: Take 20 mg by mouth daily.) 90 tablet 1  . traZODone (DESYREL) 50 MG tablet Take 1 tablet (50 mg total) by mouth at bedtime. (Patient taking differently: Take 50 mg by mouth at bedtime as needed.) 90 tablet 1  . warfarin (COUMADIN) 5 MG tablet Take 5-7.5 mg by mouth See admin instructions. Take 5 mg on Mon., Wed., Sunday. All the other days take 7.5 mg    . allopurinol (ZYLOPRIM) 300 MG tablet Take 1 tablet (300 mg total) by mouth daily. (Patient not taking: Reported on 09/13/2020) 90 tablet 1  . clindamycin (CLEOCIN) 300 MG capsule Take 300 mg by mouth once.  (Patient not taking: Reported on 09/13/2020)    . fluticasone (FLONASE) 50 MCG/ACT nasal spray Place 2 sprays into both nostrils daily. (Patient not taking: No sig reported) 16 g 6   No current facility-administered medications for this encounter.    ECOG PERFORMANCE STATUS:  0 -  Asymptomatic  REVIEW OF SYSTEMS: Patient denies any weight loss, fatigue, weakness, fever, chills or night sweats. Patient denies any loss of vision, blurred vision. Patient denies any ringing  of the ears or hearing loss. No irregular heartbeat. Patient denies heart murmur or history of fainting. Patient denies any chest pain or pain radiating to her upper extremities. Patient denies any shortness of breath, difficulty breathing at night, cough or hemoptysis. Patient denies any swelling in the lower legs. Patient denies any nausea vomiting, vomiting of blood, or coffee ground material in the vomitus. Patient denies any stomach pain. Patient states has had normal bowel movements no significant constipation or diarrhea. Patient denies any dysuria, hematuria or significant nocturia. Patient denies any problems walking, swelling in the joints or loss of balance. Patient denies any skin changes, loss of hair or loss of weight. Patient denies any excessive worrying or anxiety or significant depression. Patient denies any problems with insomnia. Patient denies excessive thirst, polyuria, polydipsia. Patient  denies any swollen glands, patient denies easy bruising or easy bleeding. Patient denies any recent infections, allergies or URI. Patient "s visual fields have not changed significantly in recent time.   PHYSICAL EXAM: BP (!) 164/106   Pulse 82   Temp 97.9 F (36.6 C) (Tympanic)   Wt 184 lb (83.5 kg)   BMI 32.59 kg/m  She is status post excision of the left nipple areolar complex no dominant masses noted in either breast in 2 positions examined.  No axillary or supraclavicular adenopathy is identified.  Well-developed well-nourished patient in NAD. HEENT reveals PERLA, EOMI, discs not visualized.  Oral cavity is clear. No oral mucosal lesions are identified. Neck is clear without evidence of cervical or supraclavicular adenopathy. Lungs are clear to A&P. Cardiac examination is essentially unremarkable with  regular rate and rhythm without murmur rub or thrill. Abdomen is benign with no organomegaly or masses noted. Motor sensory and DTR levels are equal and symmetric in the upper and lower extremities. Cranial nerves II through XII are grossly intact. Proprioception is intact. No peripheral adenopathy or edema is identified. No motor or sensory levels are noted. Crude visual fields are within normal range.  LABORATORY DATA: Pathology report reviewed    RADIOLOGY RESULTS: Mammogram and ultrasound reviewed   IMPRESSION: DCIS of the left breast Paget's disease status post wide local excision of the nipple areolar complex in 80 year old female with prior history of right breast cancer status post right breast radiation  PLAN: At this time elect go ahead with hypofractionated course of radiation therapy to her left breast over 3 weeks.  Would also boost her scar another 1000 cGy using electron beam.  Risks and benefits of treatment including skin reaction fatigue alteration blood counts possible inclusion of superficial lung all were described in detail to the patient.  I have personally 7 ordered CT simulation for next week.  Patient comprehends my treatment plan well.  I would like to take this opportunity to thank you for allowing me to participate in the care of your patient.Noreene Filbert, MD

## 2020-09-17 ENCOUNTER — Telehealth: Payer: Self-pay | Admitting: Licensed Clinical Social Worker

## 2020-09-17 NOTE — Telephone Encounter (Signed)
I called to check on this patient after she fell in radiation last Thursday. The patient did not answer, I left a message for her to call and let me know how she was feeling.

## 2020-09-19 ENCOUNTER — Ambulatory Visit
Admission: RE | Admit: 2020-09-19 | Discharge: 2020-09-19 | Disposition: A | Discharge status: Home / Self Care | Payer: Medicare Other | Source: Ambulatory Visit | Attending: Radiation Oncology | Admitting: Radiation Oncology

## 2020-09-19 DIAGNOSIS — Z510 Encounter for antineoplastic radiation therapy: Secondary | ICD-10-CM | POA: Diagnosis not present

## 2020-09-19 DIAGNOSIS — Z171 Estrogen receptor negative status [ER-]: Secondary | ICD-10-CM | POA: Insufficient documentation

## 2020-09-19 DIAGNOSIS — C50012 Malignant neoplasm of nipple and areola, left female breast: Secondary | ICD-10-CM | POA: Insufficient documentation

## 2020-09-20 ENCOUNTER — Other Ambulatory Visit: Payer: Self-pay | Admitting: *Deleted

## 2020-09-20 DIAGNOSIS — C50012 Malignant neoplasm of nipple and areola, left female breast: Secondary | ICD-10-CM

## 2020-09-20 DIAGNOSIS — Z510 Encounter for antineoplastic radiation therapy: Secondary | ICD-10-CM | POA: Diagnosis not present

## 2020-09-20 DIAGNOSIS — Z171 Estrogen receptor negative status [ER-]: Secondary | ICD-10-CM | POA: Diagnosis not present

## 2020-09-21 ENCOUNTER — Other Ambulatory Visit: Payer: Self-pay | Admitting: Family Medicine

## 2020-09-21 DIAGNOSIS — I5042 Chronic combined systolic (congestive) and diastolic (congestive) heart failure: Secondary | ICD-10-CM

## 2020-09-25 DIAGNOSIS — E213 Hyperparathyroidism, unspecified: Secondary | ICD-10-CM | POA: Diagnosis not present

## 2020-09-25 DIAGNOSIS — I4891 Unspecified atrial fibrillation: Secondary | ICD-10-CM | POA: Diagnosis not present

## 2020-09-25 DIAGNOSIS — E559 Vitamin D deficiency, unspecified: Secondary | ICD-10-CM | POA: Diagnosis not present

## 2020-09-26 ENCOUNTER — Ambulatory Visit: Admission: RE | Admit: 2020-09-26 | Payer: Medicare Other | Source: Ambulatory Visit

## 2020-09-26 ENCOUNTER — Other Ambulatory Visit: Payer: Self-pay | Admitting: Family Medicine

## 2020-09-26 DIAGNOSIS — C50012 Malignant neoplasm of nipple and areola, left female breast: Secondary | ICD-10-CM | POA: Diagnosis not present

## 2020-09-26 DIAGNOSIS — I480 Paroxysmal atrial fibrillation: Secondary | ICD-10-CM | POA: Diagnosis not present

## 2020-09-26 DIAGNOSIS — Z510 Encounter for antineoplastic radiation therapy: Secondary | ICD-10-CM | POA: Diagnosis not present

## 2020-09-26 DIAGNOSIS — I5042 Chronic combined systolic (congestive) and diastolic (congestive) heart failure: Secondary | ICD-10-CM

## 2020-09-26 DIAGNOSIS — Z171 Estrogen receptor negative status [ER-]: Secondary | ICD-10-CM | POA: Diagnosis not present

## 2020-09-27 ENCOUNTER — Ambulatory Visit
Admission: RE | Admit: 2020-09-27 | Discharge: 2020-09-27 | Disposition: A | Discharge status: Home / Self Care | Payer: Medicare Other | Source: Ambulatory Visit | Attending: Radiation Oncology | Admitting: Radiation Oncology

## 2020-09-27 DIAGNOSIS — C50012 Malignant neoplasm of nipple and areola, left female breast: Secondary | ICD-10-CM | POA: Diagnosis not present

## 2020-09-27 DIAGNOSIS — Z171 Estrogen receptor negative status [ER-]: Secondary | ICD-10-CM | POA: Diagnosis not present

## 2020-09-27 DIAGNOSIS — Z510 Encounter for antineoplastic radiation therapy: Secondary | ICD-10-CM | POA: Diagnosis not present

## 2020-09-28 ENCOUNTER — Ambulatory Visit
Admission: RE | Admit: 2020-09-28 | Discharge: 2020-09-28 | Disposition: A | Discharge status: Home / Self Care | Payer: Medicare Other | Source: Ambulatory Visit | Attending: Radiation Oncology | Admitting: Radiation Oncology

## 2020-09-28 DIAGNOSIS — Z171 Estrogen receptor negative status [ER-]: Secondary | ICD-10-CM | POA: Diagnosis not present

## 2020-09-28 DIAGNOSIS — Z510 Encounter for antineoplastic radiation therapy: Secondary | ICD-10-CM | POA: Diagnosis not present

## 2020-09-28 DIAGNOSIS — C50012 Malignant neoplasm of nipple and areola, left female breast: Secondary | ICD-10-CM | POA: Diagnosis not present

## 2020-10-01 ENCOUNTER — Ambulatory Visit
Admission: RE | Admit: 2020-10-01 | Discharge: 2020-10-01 | Disposition: A | Discharge status: Home / Self Care | Payer: Medicare Other | Source: Ambulatory Visit | Attending: Radiation Oncology | Admitting: Radiation Oncology

## 2020-10-01 DIAGNOSIS — C50012 Malignant neoplasm of nipple and areola, left female breast: Secondary | ICD-10-CM | POA: Diagnosis not present

## 2020-10-01 DIAGNOSIS — Z171 Estrogen receptor negative status [ER-]: Secondary | ICD-10-CM | POA: Diagnosis not present

## 2020-10-01 DIAGNOSIS — Z510 Encounter for antineoplastic radiation therapy: Secondary | ICD-10-CM | POA: Diagnosis not present

## 2020-10-02 ENCOUNTER — Ambulatory Visit
Admission: RE | Admit: 2020-10-02 | Discharge: 2020-10-02 | Disposition: A | Discharge status: Home / Self Care | Payer: Medicare Other | Source: Ambulatory Visit | Attending: Radiation Oncology | Admitting: Radiation Oncology

## 2020-10-02 DIAGNOSIS — C50012 Malignant neoplasm of nipple and areola, left female breast: Secondary | ICD-10-CM | POA: Diagnosis not present

## 2020-10-02 DIAGNOSIS — Z171 Estrogen receptor negative status [ER-]: Secondary | ICD-10-CM | POA: Diagnosis not present

## 2020-10-02 DIAGNOSIS — Z510 Encounter for antineoplastic radiation therapy: Secondary | ICD-10-CM | POA: Diagnosis not present

## 2020-10-03 ENCOUNTER — Ambulatory Visit
Admission: RE | Admit: 2020-10-03 | Discharge: 2020-10-03 | Disposition: A | Discharge status: Home / Self Care | Payer: Medicare Other | Source: Ambulatory Visit | Attending: Radiation Oncology | Admitting: Radiation Oncology

## 2020-10-03 DIAGNOSIS — C50012 Malignant neoplasm of nipple and areola, left female breast: Secondary | ICD-10-CM | POA: Diagnosis not present

## 2020-10-03 DIAGNOSIS — Z510 Encounter for antineoplastic radiation therapy: Secondary | ICD-10-CM | POA: Diagnosis not present

## 2020-10-03 DIAGNOSIS — Z171 Estrogen receptor negative status [ER-]: Secondary | ICD-10-CM | POA: Diagnosis not present

## 2020-10-04 ENCOUNTER — Ambulatory Visit
Admission: RE | Admit: 2020-10-04 | Discharge: 2020-10-04 | Disposition: A | Discharge status: Home / Self Care | Payer: Medicare Other | Source: Ambulatory Visit | Attending: Radiation Oncology | Admitting: Radiation Oncology

## 2020-10-04 ENCOUNTER — Other Ambulatory Visit: Payer: Self-pay | Admitting: Family Medicine

## 2020-10-04 DIAGNOSIS — Z171 Estrogen receptor negative status [ER-]: Secondary | ICD-10-CM | POA: Diagnosis not present

## 2020-10-04 DIAGNOSIS — Z510 Encounter for antineoplastic radiation therapy: Secondary | ICD-10-CM | POA: Diagnosis not present

## 2020-10-04 DIAGNOSIS — C50012 Malignant neoplasm of nipple and areola, left female breast: Secondary | ICD-10-CM | POA: Diagnosis not present

## 2020-10-05 ENCOUNTER — Ambulatory Visit
Admission: RE | Admit: 2020-10-05 | Discharge: 2020-10-05 | Disposition: A | Discharge status: Home / Self Care | Payer: Medicare Other | Source: Ambulatory Visit | Attending: Radiation Oncology | Admitting: Radiation Oncology

## 2020-10-05 DIAGNOSIS — C50012 Malignant neoplasm of nipple and areola, left female breast: Secondary | ICD-10-CM | POA: Diagnosis not present

## 2020-10-05 DIAGNOSIS — Z510 Encounter for antineoplastic radiation therapy: Secondary | ICD-10-CM | POA: Diagnosis not present

## 2020-10-05 DIAGNOSIS — Z171 Estrogen receptor negative status [ER-]: Secondary | ICD-10-CM | POA: Diagnosis not present

## 2020-10-08 ENCOUNTER — Ambulatory Visit
Admission: RE | Admit: 2020-10-08 | Discharge: 2020-10-08 | Disposition: A | Discharge status: Home / Self Care | Payer: Medicare Other | Source: Ambulatory Visit | Attending: Radiation Oncology | Admitting: Radiation Oncology

## 2020-10-08 DIAGNOSIS — Z171 Estrogen receptor negative status [ER-]: Secondary | ICD-10-CM | POA: Diagnosis not present

## 2020-10-08 DIAGNOSIS — C50012 Malignant neoplasm of nipple and areola, left female breast: Secondary | ICD-10-CM | POA: Diagnosis not present

## 2020-10-08 DIAGNOSIS — Z510 Encounter for antineoplastic radiation therapy: Secondary | ICD-10-CM | POA: Diagnosis not present

## 2020-10-09 ENCOUNTER — Ambulatory Visit
Admission: RE | Admit: 2020-10-09 | Discharge: 2020-10-09 | Disposition: A | Discharge status: Home / Self Care | Payer: Medicare Other | Source: Ambulatory Visit | Attending: Radiation Oncology | Admitting: Radiation Oncology

## 2020-10-09 DIAGNOSIS — C50012 Malignant neoplasm of nipple and areola, left female breast: Secondary | ICD-10-CM | POA: Insufficient documentation

## 2020-10-09 DIAGNOSIS — Z510 Encounter for antineoplastic radiation therapy: Secondary | ICD-10-CM | POA: Diagnosis not present

## 2020-10-09 DIAGNOSIS — Z171 Estrogen receptor negative status [ER-]: Secondary | ICD-10-CM | POA: Diagnosis not present

## 2020-10-10 ENCOUNTER — Ambulatory Visit
Admission: RE | Admit: 2020-10-10 | Discharge: 2020-10-10 | Disposition: A | Discharge status: Home / Self Care | Payer: Medicare Other | Source: Ambulatory Visit | Attending: Radiation Oncology | Admitting: Radiation Oncology

## 2020-10-10 DIAGNOSIS — C50012 Malignant neoplasm of nipple and areola, left female breast: Secondary | ICD-10-CM | POA: Diagnosis not present

## 2020-10-10 DIAGNOSIS — Z510 Encounter for antineoplastic radiation therapy: Secondary | ICD-10-CM | POA: Diagnosis not present

## 2020-10-10 DIAGNOSIS — Z171 Estrogen receptor negative status [ER-]: Secondary | ICD-10-CM | POA: Diagnosis not present

## 2020-10-11 ENCOUNTER — Ambulatory Visit
Admission: RE | Admit: 2020-10-11 | Discharge: 2020-10-11 | Disposition: A | Discharge status: Home / Self Care | Payer: Medicare Other | Source: Ambulatory Visit | Attending: Radiation Oncology | Admitting: Radiation Oncology

## 2020-10-11 ENCOUNTER — Inpatient Hospital Stay: Payer: Medicare Other

## 2020-10-11 ENCOUNTER — Other Ambulatory Visit: Payer: Self-pay

## 2020-10-11 DIAGNOSIS — Z171 Estrogen receptor negative status [ER-]: Secondary | ICD-10-CM | POA: Diagnosis not present

## 2020-10-11 DIAGNOSIS — C50012 Malignant neoplasm of nipple and areola, left female breast: Secondary | ICD-10-CM | POA: Insufficient documentation

## 2020-10-11 DIAGNOSIS — Z510 Encounter for antineoplastic radiation therapy: Secondary | ICD-10-CM | POA: Diagnosis not present

## 2020-10-11 LAB — CBC
HCT: 37.8 % (ref 36.0–46.0)
Hemoglobin: 12.5 g/dL (ref 12.0–15.0)
MCH: 29.6 pg (ref 26.0–34.0)
MCHC: 33.1 g/dL (ref 30.0–36.0)
MCV: 89.4 fL (ref 80.0–100.0)
Platelets: 223 10*3/uL (ref 150–400)
RBC: 4.23 MIL/uL (ref 3.87–5.11)
RDW: 13.3 % (ref 11.5–15.5)
WBC: 5.2 10*3/uL (ref 4.0–10.5)
nRBC: 0 % (ref 0.0–0.2)

## 2020-10-12 ENCOUNTER — Ambulatory Visit
Admission: RE | Admit: 2020-10-12 | Discharge: 2020-10-12 | Disposition: A | Discharge status: Home / Self Care | Payer: Medicare Other | Source: Ambulatory Visit | Attending: Radiation Oncology | Admitting: Radiation Oncology

## 2020-10-12 DIAGNOSIS — Z171 Estrogen receptor negative status [ER-]: Secondary | ICD-10-CM | POA: Diagnosis not present

## 2020-10-12 DIAGNOSIS — Z510 Encounter for antineoplastic radiation therapy: Secondary | ICD-10-CM | POA: Diagnosis not present

## 2020-10-12 DIAGNOSIS — C50012 Malignant neoplasm of nipple and areola, left female breast: Secondary | ICD-10-CM | POA: Diagnosis not present

## 2020-10-14 ENCOUNTER — Other Ambulatory Visit: Payer: Self-pay | Admitting: Family Medicine

## 2020-10-14 DIAGNOSIS — I480 Paroxysmal atrial fibrillation: Secondary | ICD-10-CM

## 2020-10-15 ENCOUNTER — Ambulatory Visit
Admission: RE | Admit: 2020-10-15 | Discharge: 2020-10-15 | Disposition: A | Discharge status: Home / Self Care | Payer: Medicare Other | Source: Ambulatory Visit | Attending: Radiation Oncology | Admitting: Radiation Oncology

## 2020-10-15 DIAGNOSIS — Z510 Encounter for antineoplastic radiation therapy: Secondary | ICD-10-CM | POA: Diagnosis not present

## 2020-10-15 DIAGNOSIS — C50012 Malignant neoplasm of nipple and areola, left female breast: Secondary | ICD-10-CM | POA: Diagnosis not present

## 2020-10-15 DIAGNOSIS — Z171 Estrogen receptor negative status [ER-]: Secondary | ICD-10-CM | POA: Diagnosis not present

## 2020-10-16 ENCOUNTER — Ambulatory Visit
Admission: RE | Admit: 2020-10-16 | Discharge: 2020-10-16 | Disposition: A | Discharge status: Home / Self Care | Payer: Medicare Other | Source: Ambulatory Visit | Attending: Radiation Oncology | Admitting: Radiation Oncology

## 2020-10-16 DIAGNOSIS — Z171 Estrogen receptor negative status [ER-]: Secondary | ICD-10-CM | POA: Diagnosis not present

## 2020-10-16 DIAGNOSIS — C50012 Malignant neoplasm of nipple and areola, left female breast: Secondary | ICD-10-CM | POA: Diagnosis not present

## 2020-10-16 DIAGNOSIS — Z510 Encounter for antineoplastic radiation therapy: Secondary | ICD-10-CM | POA: Diagnosis not present

## 2020-10-17 ENCOUNTER — Ambulatory Visit
Admission: RE | Admit: 2020-10-17 | Discharge: 2020-10-17 | Disposition: A | Discharge status: Home / Self Care | Payer: Medicare Other | Source: Ambulatory Visit | Attending: Radiation Oncology | Admitting: Radiation Oncology

## 2020-10-17 DIAGNOSIS — C50012 Malignant neoplasm of nipple and areola, left female breast: Secondary | ICD-10-CM | POA: Diagnosis not present

## 2020-10-17 DIAGNOSIS — Z171 Estrogen receptor negative status [ER-]: Secondary | ICD-10-CM | POA: Diagnosis not present

## 2020-10-17 DIAGNOSIS — Z510 Encounter for antineoplastic radiation therapy: Secondary | ICD-10-CM | POA: Diagnosis not present

## 2020-10-18 ENCOUNTER — Ambulatory Visit
Admission: RE | Admit: 2020-10-18 | Discharge: 2020-10-18 | Disposition: A | Discharge status: Home / Self Care | Payer: Medicare Other | Source: Ambulatory Visit | Attending: Radiation Oncology | Admitting: Radiation Oncology

## 2020-10-18 DIAGNOSIS — Z510 Encounter for antineoplastic radiation therapy: Secondary | ICD-10-CM | POA: Diagnosis not present

## 2020-10-18 DIAGNOSIS — Z171 Estrogen receptor negative status [ER-]: Secondary | ICD-10-CM | POA: Diagnosis not present

## 2020-10-18 DIAGNOSIS — C50012 Malignant neoplasm of nipple and areola, left female breast: Secondary | ICD-10-CM | POA: Diagnosis not present

## 2020-10-19 ENCOUNTER — Ambulatory Visit
Admission: RE | Admit: 2020-10-19 | Discharge: 2020-10-19 | Disposition: A | Discharge status: Home / Self Care | Payer: Medicare Other | Source: Ambulatory Visit | Attending: Radiation Oncology | Admitting: Radiation Oncology

## 2020-10-19 DIAGNOSIS — C50012 Malignant neoplasm of nipple and areola, left female breast: Secondary | ICD-10-CM | POA: Diagnosis not present

## 2020-10-19 DIAGNOSIS — Z171 Estrogen receptor negative status [ER-]: Secondary | ICD-10-CM | POA: Diagnosis not present

## 2020-10-19 DIAGNOSIS — Z510 Encounter for antineoplastic radiation therapy: Secondary | ICD-10-CM | POA: Diagnosis not present

## 2020-10-22 ENCOUNTER — Ambulatory Visit
Admission: RE | Admit: 2020-10-22 | Discharge: 2020-10-22 | Disposition: A | Discharge status: Home / Self Care | Payer: Medicare Other | Source: Ambulatory Visit | Attending: Radiation Oncology | Admitting: Radiation Oncology

## 2020-10-22 DIAGNOSIS — Z510 Encounter for antineoplastic radiation therapy: Secondary | ICD-10-CM | POA: Diagnosis not present

## 2020-10-22 DIAGNOSIS — C50012 Malignant neoplasm of nipple and areola, left female breast: Secondary | ICD-10-CM | POA: Diagnosis not present

## 2020-10-22 DIAGNOSIS — Z171 Estrogen receptor negative status [ER-]: Secondary | ICD-10-CM | POA: Diagnosis not present

## 2020-10-23 ENCOUNTER — Ambulatory Visit
Admission: RE | Admit: 2020-10-23 | Discharge: 2020-10-23 | Disposition: A | Discharge status: Home / Self Care | Payer: Medicare Other | Source: Ambulatory Visit | Attending: Radiation Oncology | Admitting: Radiation Oncology

## 2020-10-23 DIAGNOSIS — C50012 Malignant neoplasm of nipple and areola, left female breast: Secondary | ICD-10-CM | POA: Diagnosis not present

## 2020-10-23 DIAGNOSIS — Z510 Encounter for antineoplastic radiation therapy: Secondary | ICD-10-CM | POA: Diagnosis not present

## 2020-10-23 DIAGNOSIS — Z171 Estrogen receptor negative status [ER-]: Secondary | ICD-10-CM | POA: Diagnosis not present

## 2020-10-24 ENCOUNTER — Ambulatory Visit
Admission: RE | Admit: 2020-10-24 | Discharge: 2020-10-24 | Disposition: A | Discharge status: Home / Self Care | Payer: Medicare Other | Source: Ambulatory Visit | Attending: Radiation Oncology | Admitting: Radiation Oncology

## 2020-10-24 DIAGNOSIS — Z171 Estrogen receptor negative status [ER-]: Secondary | ICD-10-CM | POA: Diagnosis not present

## 2020-10-24 DIAGNOSIS — Z510 Encounter for antineoplastic radiation therapy: Secondary | ICD-10-CM | POA: Diagnosis not present

## 2020-10-24 DIAGNOSIS — C50012 Malignant neoplasm of nipple and areola, left female breast: Secondary | ICD-10-CM | POA: Diagnosis not present

## 2020-10-25 ENCOUNTER — Ambulatory Visit
Admission: RE | Admit: 2020-10-25 | Discharge: 2020-10-25 | Disposition: A | Discharge status: Home / Self Care | Payer: Medicare Other | Source: Ambulatory Visit | Attending: Radiation Oncology | Admitting: Radiation Oncology

## 2020-10-25 DIAGNOSIS — Z510 Encounter for antineoplastic radiation therapy: Secondary | ICD-10-CM | POA: Diagnosis not present

## 2020-10-25 DIAGNOSIS — Z171 Estrogen receptor negative status [ER-]: Secondary | ICD-10-CM | POA: Diagnosis not present

## 2020-10-25 DIAGNOSIS — C50012 Malignant neoplasm of nipple and areola, left female breast: Secondary | ICD-10-CM | POA: Diagnosis not present

## 2020-10-29 NOTE — Progress Notes (Signed)
Name: [name]   MRN: [id number]    DOB: Feb 04, 1941   Date:10/31/2020       Progress Note  Subjective  Chief Complaint  Follow Up  HPI  Chronic combined CHF: she is on Demadex, potassium and magnesium daily, leg cramps still present but not as severe. Offdigoxinbut is taking metoprolol for rate control.  No chest , but has some with moderate activity, she denies orthopnea, no lower extremity edema. She is not on ACE because of cough, she was on Losartan for a period of time but bp dropped so cardiologist discontinued medications. She is up to date Dr. Clayborn Bigness.    HTN: taking metoprolol, bp is well controlled  No chest painshe has mild SOB with moderate activity  with activity but is stable.   Hyperlipidemia:she stopped taking Atorvastatin but is taking Zetia, we sent rx of Repatha but she states states she does not want injectables at this time   Insomnia:shetakes Trazodone prnusually just half a pill, most nights she sleeps without medication. She states caffeine disturbs her sleep Stable   Gout:no recent episodes, on Allopurinol , last time she had a flare had steroid injection and no need for colchine   Paget's disease of left breast: she noticed a growth on left nipple Fall 2021 , she came in after a couple of months, was referred Dr. Bary Castilla had a biopsy followed by excisional biopsy and had 4 weeks of radiation with Dr. Donella Stade , she finished last week, she states breast has some itching and burning but using lotion and improving, no rash  Dysmetabolic Syndrome: She denies polyphagia, polyuria or polydipsiaLast hgbA1Cwas 5.7%,she was on a keto diet and lost about 10 lbs but is gaining it back since stopped it She is currently counting calories 1500 max and low carb diet . Recheck labs next visit   Afib: rate controlled, no fluttering sensation, taking betablocker andoffdigoxin, also on coumadin and goes to coumadin clinic/Dr. Callwood. Denies bleeding or  bruising , no blood in stools   Chronic bronchitis : She states she no longer coughing in the mornings, she only has SOB only intermittently with moderate activity, she has noticed some wheezing lately Quit smoking in 2010.She uses Spiriva daily   Depression:shehas a long history of depression, used to take SSRI's, symptoms now are intermittent and has been in remission for months, only symptoms is difficulty sleeping and takes trazodone prn only, occasionally only. Shedenies suicidal thoughts or ideation.She is motivated, working part timeand is feeling well. She did not stop when getting Paget's disease therapy, kept on working   Hyperparathyroidism: she was seen by nephrologist advised to take two pills potassium daily and demadex only once daily. She went to Dr. Honor Junes and was given reassurance   Patient Active Problem List   Diagnosis Date Noted  . Paget's disease of right breast ([place]) 07/18/2020  . Tibialis posterior tendinitis 03/08/2020  . Morbid obesity ([place]) 01/05/2020  . On Coumadin for atrial fibrillation ([place]) 06/30/2018  . Chronic anticoagulation 08/27/2017  . History of right breast cancer 08/27/2017  . Chronic obstructive pulmonary disease ([place]) 07/04/2015  . Controlled gout 06/06/2015  . Anxiety 02/09/2015  . Synovial cyst of popliteal space 02/09/2015  . Benign hypertension 02/09/2015  . Blood type A+ 02/09/2015  . Arterial branch occlusion of retina 02/09/2015  . Chronic constipation 02/09/2015  . Insomnia, persistent 02/09/2015  . Chronic combined systolic and diastolic CHF, NYHA class 1 ([place]) 02/09/2015  . Dyslipidemia 02/09/2015  . Atrial fibrillation (  [place]) 02/09/2015  . Arthritis urica 02/09/2015  . Hearing loss 02/09/2015  . History of open heart surgery 02/09/2015  . Chronic hoarseness 02/09/2015  . Cardiomegaly 02/09/2015  . Dysmetabolic syndrome 78/24/2353  . Migraine with aura and without status migrainosus, not intractable 02/09/2015  .  Hypertensive pulmonary vascular disease ([place]) 02/09/2015  . Allergic rhinitis, seasonal 02/09/2015  . Vitamin D deficiency 02/09/2015  . History of mitral valve repair 10/01/2012  . History of aortic valve replacement 10/01/2012  . Congestive heart failure ([place]) 10/01/2012    Past Surgical History:  Procedure Laterality Date  . ABDOMINAL HYSTERECTOMY  1996  . arm surgery  2019  . BREAST BIOPSY Right 1998   neg  . BREAST BIOPSY Right 2007   neg  . BREAST BIOPSY Right 1992   positive  . BREAST EXCISIONAL BIOPSY Left 2000   neg  . BREAST LUMPECTOMY Right 1992  . BREAST LUMPECTOMY Left 08/17/2020   Procedure: BREAST LUMPECTOMY;  Surgeon: Robert Bellow, MD;  Location: [facility] ORS;  Service: General;  Laterality: Left;  . CARDIAC VALVE REPLACEMENT  2014  . CATARACT EXTRACTION W/PHACO Left 03/30/2018   Procedure: CATARACT EXTRACTION PHACO AND INTRAOCULAR LENS PLACEMENT (IOC);  Surgeon: Birder Robson, MD;  Location: [facility] ORS;  Service: Ophthalmology;  Laterality: Left;  [place] 00:31.9 AP% 12.1 CDE 3.87 Fluid Pack lot # [id number]  . CESAREAN SECTION    . FRACTURE SURGERY Left    ARM/ LEG  . LEG SURGERY      Family History  Problem Relation Age of Onset  . Cancer Mother   . Diabetes Mother   . Hypertension Mother   . Breast cancer Mother 72  . Hypertension Father   . Heart failure Father   . Cancer Brother        bladder  . Cancer Sister        breast  . Breast cancer Sister 49  . Hypertension Son   . Hypertension Daughter   . Breast cancer Sister 58    Social History   Tobacco Use  . Smoking status: Former Smoker    Quit date: 09/09/2007    Years since quitting: 13.1  . Smokeless tobacco: Never Used  Substance Use Topics  . Alcohol use: No    Alcohol/week: 0.0 standard drinks     Current Outpatient Medications:  .  acetaminophen (TYLENOL) 500 MG tablet, Take 1,000 mg by mouth every 6 (six) hours as needed for mild pain or moderate pain. , Disp: , Rfl:  .   Ergocalciferol (VITAMIN D2) 50 MCG (2000 UT) TABS, Take 2,000 Units by mouth daily., Disp: , Rfl:  .  ezetimibe (ZETIA) 10 MG tablet, TAKE 1 TABLET BY MOUTH EVERY DAY (Patient taking differently: Take 10 mg by mouth daily.), Disp: 90 tablet, Rfl: 3 .  Omega 3 1000 MG CAPS, Take 1,000 mg by mouth daily., Disp: , Rfl:  .  tiotropium (SPIRIVA HANDIHALER) 18 MCG inhalation capsule, INHALE 1 CASPULE VIA HANDIHALER ONCE A DAY AT THE SAME TIME EVERY DAY (Patient taking differently: Place 18 mcg into inhaler and inhale daily.), Disp: 90 capsule, Rfl: 1 .  torsemide (DEMADEX) 20 MG tablet, TAKE 1 TABLET BY MOUTH EVERY DAY (Patient taking differently: Take 20 mg by mouth daily.), Disp: 90 tablet, Rfl: 1 .  warfarin (COUMADIN) 5 MG tablet, Take 5-7.5 mg by mouth See admin instructions. Take 5 mg on Mon., Wed., Sunday. All the other days take 7.5 mg, Disp: , Rfl:  .  allopurinol (ZYLOPRIM) 300 MG tablet, Take 1 tablet (300 mg total) by mouth daily., Disp: 90 tablet, Rfl: 1 .  colchicine 0.6 MG tablet, Take 1-2 tablets (0.6-1.2 mg total) by mouth daily. And repeat in one hour for gout attacks prn (Patient not taking: Reported on 10/31/2020), Disp: 30 tablet, Rfl: 0 .  Evolocumab (REPATHA) 140 MG/ML SOSY, Inject 1 mL into the skin every 14 (fourteen) days. (Patient not taking: Reported on 10/31/2020), Disp: 2 mL, Rfl: 5 .  metoprolol tartrate (LOPRESSOR) 25 MG tablet, Take 1 tablet (25 mg total) by mouth 2 (two) times daily., Disp: 180 tablet, Rfl: 0 .  potassium chloride SA (KLOR-CON M20) 20 MEQ tablet, Take 1 tablet (20 mEq total) by mouth 2 (two) times daily., Disp: 180 tablet, Rfl: 0 .  traZODone (DESYREL) 50 MG tablet, Take 1 tablet (50 mg total) by mouth at bedtime as needed., Disp: 90 tablet, Rfl: 1  Allergies  Allergen Reactions  . Ace Inhibitors Cough and Other (See Comments)  . Amoxicillin-Pot Clavulanate Nausea And Vomiting  . Codeine Nausea And Vomiting  . Penicillins Nausea And Vomiting and Other (See  Comments)    Has patient had a PCN reaction causing immediate rash, facial/tongue/throat swelling, SOB or lightheadedness with hypotension: No Has patient had a PCN reaction causing severe rash involving mucus membranes or skin necrosis: No Has patient had a PCN reaction that required hospitalization No Has patient had a PCN reaction occurring within the last 10 years: No If all of the above answers are "NO", then may proceed with Cephalosporin use.  . Rosuvastatin Other (See Comments)    Reaction:  Joint pain   . Tetanus-Diphtheria Toxoids Td Swelling    I personally reviewed active problem list, medication list, allergies, family history, social history, health maintenance with the patient/caregiver today.   ROS  Constitutional: Negative for fever or weight change.  Respiratory: Negative for cough and shortness of breath.   Cardiovascular: Negative for chest pain or palpitations.  Gastrointestinal: Negative for abdominal pain, no bowel changes.  Musculoskeletal: Negative for gait problem or joint swelling.  Skin: Negative for rash.  Neurological: Negative for dizziness or headache.  No other specific complaints in a complete review of systems (except as listed in HPI above).  Objective  Vitals:   10/31/20 1524  BP: 126/82  Pulse: 84  Resp: 16  Temp: 97.9 F (36.6 C)  TempSrc: Oral  SpO2: 99%  Weight: 184 lb (83.5 kg)  Height: 5\' 3"  (1.6 m)    Body mass index is 32.59 kg/m.  Physical Exam  Constitutional: Patient appears well-developed and well-nourished. Obese No distress.  HEENT: head atraumatic, normocephalic, pupils equal and reactive to light, neck supple Cardiovascular: Normal rate, regular rhythm and normal heart sounds.  No murmur heard. No BLE edema. Pulmonary/Chest: Effort normal and breath sounds normal. No respiratory distress. Abdominal: Soft.  There is no tenderness. Breast: nipple absent, skin has some hyperpigmentation on left breast, no rashes   Psychiatric: Patient has a normal mood and affect. behavior is normal. Judgment and thought content normal.    PHQ2/9: Depression screen [facility] 2/9 10/31/2020 06/28/2020 02/20/2020 01/05/2020 11/16/2019  Decreased Interest 0 0 0 0 0  Down, Depressed, Hopeless 0 0 0 0 0  PHQ - 2 Score 0 0 0 0 0  Altered sleeping - - 0 0 0  Tired, decreased energy - - 0 0 0  Change in appetite - - 0 0 0  Feeling bad or failure about yourself  - -  0 0 0  Trouble concentrating - - 0 0 0  Moving slowly or fidgety/restless - - 0 0 0  Suicidal thoughts - - 0 0 0  PHQ-9 Score - - 0 0 0  Difficult doing work/chores - - - Not difficult at all Not difficult at all  Some recent data might be hidden    phq 9 is negative   Fall Risk: Fall Risk  10/31/2020 06/28/2020 02/20/2020 01/05/2020 11/16/2019  Falls in the past year? 0 0 0 0 0  Number falls in past yr: 0 0 0 0 0  Injury with Fall? 0 0 0 0 0  Comment - - - - -  Risk for fall due to : - - - - -  Follow up - - - - -     Functional Status Survey: Is the patient deaf or have difficulty hearing?: No Does the patient have difficulty seeing, even when wearing glasses/contacts?: No Does the patient have difficulty concentrating, remembering, or making decisions?: No Does the patient have difficulty walking or climbing stairs?: No Does the patient have difficulty dressing or bathing?: No Does the patient have difficulty doing errands alone such as visiting a doctor's office or shopping?: No   Assessment & Plan  1. Paroxysmal atrial fibrillation (HCC)  - metoprolol tartrate (LOPRESSOR) 25 MG tablet; Take 1 tablet (25 mg total) by mouth 2 (two) times daily.  Dispense: 180 tablet; Refill: 0  2. Simple chronic bronchitis (HCC)  Continue spiriva   3. Insomnia, unspecified type  - traZODone (DESYREL) 50 MG tablet; Take 1 tablet (50 mg total) by mouth at bedtime as needed.  Dispense: 90 tablet; Refill: 1  4. Major depression in remission [facility])  Doing well ,  used   5. Chronic combined systolic and diastolic CHF, NYHA class 1 (HCC)  - potassium chloride SA (KLOR-CON M20) 20 MEQ tablet; Take 1 tablet (20 mEq total) by mouth 2 (two) times daily.  Dispense: 180 tablet; Refill: 0  6. Pulmonary hypertension ([place])  Discussed sleep study with patient but she refused it at this time    7. Paget's disease of left breast ([place])  Finished radiation last week   8. Controlled gout  - allopurinol (ZYLOPRIM) 300 MG tablet; Take 1 tablet (300 mg total) by mouth daily.  Dispense: 90 tablet; Refill: 1  9. Vitamin D deficiency   10. Benign hypertension   11. Statin myopathy

## 2020-10-31 ENCOUNTER — Ambulatory Visit (INDEPENDENT_AMBULATORY_CARE_PROVIDER_SITE_OTHER): Payer: Medicare Other | Admitting: Family Medicine

## 2020-10-31 ENCOUNTER — Other Ambulatory Visit: Payer: Self-pay

## 2020-10-31 ENCOUNTER — Encounter: Payer: Self-pay | Admitting: Family Medicine

## 2020-10-31 VITALS — BP 126/82 | HR 84 | Temp 97.9°F | Resp 16 | Ht 63.0 in | Wt 184.0 lb

## 2020-10-31 DIAGNOSIS — T466X5A Adverse effect of antihyperlipidemic and antiarteriosclerotic drugs, initial encounter: Secondary | ICD-10-CM

## 2020-10-31 DIAGNOSIS — J410 Simple chronic bronchitis: Secondary | ICD-10-CM | POA: Diagnosis not present

## 2020-10-31 DIAGNOSIS — C50012 Malignant neoplasm of nipple and areola, left female breast: Secondary | ICD-10-CM

## 2020-10-31 DIAGNOSIS — M109 Gout, unspecified: Secondary | ICD-10-CM | POA: Diagnosis not present

## 2020-10-31 DIAGNOSIS — E559 Vitamin D deficiency, unspecified: Secondary | ICD-10-CM | POA: Diagnosis not present

## 2020-10-31 DIAGNOSIS — I10 Essential (primary) hypertension: Secondary | ICD-10-CM

## 2020-10-31 DIAGNOSIS — G720 Drug-induced myopathy: Secondary | ICD-10-CM

## 2020-10-31 DIAGNOSIS — F325 Major depressive disorder, single episode, in full remission: Secondary | ICD-10-CM

## 2020-10-31 DIAGNOSIS — I480 Paroxysmal atrial fibrillation: Secondary | ICD-10-CM

## 2020-10-31 DIAGNOSIS — I272 Pulmonary hypertension, unspecified: Secondary | ICD-10-CM

## 2020-10-31 DIAGNOSIS — G4700 Insomnia, unspecified: Secondary | ICD-10-CM | POA: Diagnosis not present

## 2020-10-31 DIAGNOSIS — I5042 Chronic combined systolic (congestive) and diastolic (congestive) heart failure: Secondary | ICD-10-CM

## 2020-10-31 MED ORDER — POTASSIUM CHLORIDE CRYS ER 20 MEQ PO TBCR: 20.0000 meq | EXTENDED_RELEASE_TABLET | Freq: Two times a day (BID) | ORAL | 0 refills | Status: DC

## 2020-10-31 MED ORDER — METOPROLOL TARTRATE 25 MG PO TABS: 25.0000 mg | ORAL_TABLET | Freq: Two times a day (BID) | ORAL | 0 refills | Status: DC

## 2020-10-31 MED ORDER — TRAZODONE HCL 50 MG PO TABS: 50.0000 mg | ORAL_TABLET | Freq: Every evening | ORAL | 1 refills | Status: DC | PRN

## 2020-10-31 MED ORDER — ALLOPURINOL 300 MG PO TABS: 300.0000 mg | ORAL_TABLET | Freq: Every day | ORAL | 1 refills | Status: DC

## 2020-11-06 DIAGNOSIS — I480 Paroxysmal atrial fibrillation: Secondary | ICD-10-CM | POA: Diagnosis not present

## 2020-11-12 ENCOUNTER — Ambulatory Visit: Payer: Medicare Other | Admitting: Family Medicine

## 2020-11-19 DIAGNOSIS — M76822 Posterior tibial tendinitis, left leg: Secondary | ICD-10-CM | POA: Diagnosis not present

## 2020-11-26 DIAGNOSIS — I480 Paroxysmal atrial fibrillation: Secondary | ICD-10-CM | POA: Diagnosis not present

## 2020-11-28 ENCOUNTER — Ambulatory Visit: Payer: Medicare Other | Admitting: Radiation Oncology

## 2020-12-06 ENCOUNTER — Other Ambulatory Visit: Payer: Self-pay

## 2020-12-06 ENCOUNTER — Ambulatory Visit
Admission: RE | Admit: 2020-12-06 | Discharge: 2020-12-06 | Disposition: A | Discharge status: Home / Self Care | Payer: Medicare Other | Source: Ambulatory Visit | Attending: Radiation Oncology | Admitting: Radiation Oncology

## 2020-12-06 ENCOUNTER — Encounter: Payer: Self-pay | Admitting: Radiation Oncology

## 2020-12-06 VITALS — BP 150/87 | HR 86 | Temp 97.1°F | Wt 182.0 lb

## 2020-12-06 DIAGNOSIS — C50012 Malignant neoplasm of nipple and areola, left female breast: Secondary | ICD-10-CM | POA: Diagnosis not present

## 2020-12-06 DIAGNOSIS — Z923 Personal history of irradiation: Secondary | ICD-10-CM | POA: Insufficient documentation

## 2020-12-06 NOTE — Progress Notes (Signed)
Radiation Oncology Follow up Note  Name: [name]   Date:   12/06/2020 MRN:  [id number] DOB: 06-26-1941    This 80 y.o. female presents to the clinic today for 1 month follow-up status post whole breast radiation to her left breast for ductal carcinoma in situ.  REFERRING PROVIDER: [name], MD  HPI: Patient is a 80 year old female 1 month out from whole breast radiation to her left breast for Paget's disease with ductal carcinoma in situ.  Seen today in routine follow-up she is doing well.  She specifically denies breast tenderness cough or bone pain.  She has talked to Dr. Tollie Pizza will not be going on antiestrogen therapy..  COMPLICATIONS OF TREATMENT: none  FOLLOW UP COMPLIANCE: keeps appointments   PHYSICAL EXAM:  BP (!) 150/87   Pulse 86   Temp (!) 97.1 F (36.2 C) (Tympanic)   Wt 182 lb (82.6 kg)   BMI 32.24 kg/m  Lungs are clear to A&P cardiac examination essentially unremarkable with regular rate and rhythm. No dominant mass or nodularity is noted in either breast in 2 positions examined. Incision is well-healed. No axillary or supraclavicular adenopathy is appreciated. Cosmetic result is excellent.  There are still some hyperpigmentation of the skin which we would expect.  Well-developed well-nourished patient in NAD. HEENT reveals PERLA, EOMI, discs not visualized.  Oral cavity is clear. No oral mucosal lesions are identified. Neck is clear without evidence of cervical or supraclavicular adenopathy. Lungs are clear to A&P. Cardiac examination is essentially unremarkable with regular rate and rhythm without murmur rub or thrill. Abdomen is benign with no organomegaly or masses noted. Motor sensory and DTR levels are equal and symmetric in the upper and lower extremities. Cranial nerves II through XII are grossly intact. Proprioception is intact. No peripheral adenopathy or edema is identified. No motor or sensory levels are noted. Crude visual fields are within normal  range.  RADIOLOGY RESULTS: No current films to review  PLAN: Present time patient is recovering well from her radiation therapy treatments.  And pleased with her overall progress.  I have asked to see her back in 4 to 5 months for follow-up.  Patient knows to call with any concerns.  I would like to take this opportunity to thank you for allowing me to participate in the care of your patient.Noreene Filbert, MD

## 2020-12-27 DIAGNOSIS — I480 Paroxysmal atrial fibrillation: Secondary | ICD-10-CM | POA: Diagnosis not present

## 2021-01-19 ENCOUNTER — Other Ambulatory Visit: Payer: Self-pay | Admitting: Family Medicine

## 2021-01-19 DIAGNOSIS — I5042 Chronic combined systolic (congestive) and diastolic (congestive) heart failure: Secondary | ICD-10-CM

## 2021-01-19 DIAGNOSIS — I10 Essential (primary) hypertension: Secondary | ICD-10-CM

## 2021-01-19 NOTE — Telephone Encounter (Signed)
Requested Prescriptions  Pending Prescriptions Disp Refills  . torsemide (DEMADEX) 20 MG tablet [Pharmacy Med Name: TORSEMIDE 20 MG TABLET] 30 tablet 1    Sig: TAKE 1 TABLET BY MOUTH EVERY DAY     Cardiovascular:  Diuretics - Loop Failed - 01/19/2021  9:17 AM      Failed - Cr in normal range and within 360 days    Creat  Date Value Ref Range Status  06/28/2020 0.95 (H) 0.60 - 0.93 mg/dL Final    Comment:    For patients >78 years of age, the reference limit for Creatinine is approximately 13% higher for people identified as African-American. .          Failed - Last BP in normal range    BP Readings from Last 1 Encounters:  12/06/20 (!) 150/87         Passed - K in normal range and within 360 days    Potassium  Date Value Ref Range Status  08/13/2020 3.6 3.5 - 5.1 mmol/L Final    Comment:    Performed at [facility], [address] [place], [place] [address]  08/17/2012 3.3 (L) 3.5 - 5.1 mmol/L Final         Passed - Ca in normal range and within 360 days    Calcium  Date Value Ref Range Status  06/28/2020 9.5 8.6 - 10.4 mg/dL Final   Calcium, Total  Date Value Ref Range Status  08/17/2012 9.0 8.5 - 10.1 mg/dL Final         Passed - Na in normal range and within 360 days    Sodium  Date Value Ref Range Status  06/28/2020 142 135 - 146 mmol/L Final  01/17/2016 142 134 - 144 mmol/L Final  08/17/2012 144 136 - 145 mmol/L Final         Passed - Valid encounter within last 6 months    Recent Outpatient Visits          2 months ago Paroxysmal atrial fibrillation [facility])   [facility] [name], MD   6 months ago Simple chronic bronchitis [facility])   [facility] [place], Drue Stager, MD   11 months ago Chronic combined systolic and diastolic CHF, NYHA class 1 [facility])   [facility] [name], MD   1 year ago Acute gout of foot, unspecified cause, unspecified laterality   [facility] Conners D, MD   1 year ago Chronic combined systolic and diastolic CHF, NYHA class 1 [facility])   [facility] [name], MD      Future Appointments            In 1 month Ancil Boozer, Drue Stager, MD [facility], [facility]

## 2021-01-21 ENCOUNTER — Ambulatory Visit: Payer: Self-pay

## 2021-01-21 NOTE — Telephone Encounter (Signed)
Pt called regarding sinus congestion and drainage.   Pt feels fine otherwise. Pt is taking a antihistamine with some success.  Pt has taken zyrtec in the past and was able to control S/S with that medication.  Pt will go to the pharmacy to pickup zyrtec or other allergy medication with advice from pharmacist.  Pt feels well otherwise.   Reason for Disposition . [1] Sinus congestion as part of a cold AND [2] present < 10 days  Answer Assessment - Initial Assessment Questions 1. LOCATION: "Where does it hurt?"      NA 2. ONSET: "When did the sinus pain start?"  (e.g., hours, days)      na 3. SEVERITY: "How bad is the pain?"   (Scale 1-10; mild, moderate or severe)   - MILD (1-3): doesn't interfere with normal activities    - MODERATE (4-7): interferes with normal activities (e.g., work or school) or awakens from sleep   - SEVERE (8-10): excruciating pain and patient unable to do any normal activities     0 4. RECURRENT SYMPTOM: "Have you ever had sinus problems before?" If Yes, ask: "When was the last time?" and "What happened that time?"      About every other year 5. NASAL CONGESTION: "Is the nose blocked?" If Yes, ask: "Can you open it or must you breathe through your mouth?"    No  - nasal drainage 6. NASAL DISCHARGE: "Do you have discharge from your nose?" If so ask, "What color?"     Drainage down back of throat 7. FEVER: "Do you have a fever?" If Yes, ask: "What is it, how was it measured, and when did it start?"      No fever 8. OTHER SYMPTOMS: "Do you have any other symptoms?" (e.g., sore throat, cough, earache, difficulty breathing)     No 9. PREGNANCY: "Is there any chance you are pregnant?" "When was your last menstrual period?"     NA  Protocols used: SINUS PAIN OR CONGESTION-A-AH

## 2021-01-22 ENCOUNTER — Encounter: Payer: Self-pay | Admitting: Unknown Physician Specialty

## 2021-01-22 ENCOUNTER — Other Ambulatory Visit: Payer: Self-pay

## 2021-01-22 ENCOUNTER — Ambulatory Visit (INDEPENDENT_AMBULATORY_CARE_PROVIDER_SITE_OTHER): Payer: Medicare Other | Admitting: Unknown Physician Specialty

## 2021-01-22 ENCOUNTER — Ambulatory Visit: Payer: Self-pay | Admitting: *Deleted

## 2021-01-22 DIAGNOSIS — U071 COVID-19: Secondary | ICD-10-CM

## 2021-01-22 MED ORDER — NIRMATRELVIR/RITONAVIR (PAXLOVID)TABLET
3.0000 | ORAL_TABLET | Freq: Two times a day (BID) | ORAL | 0 refills | Status: AC
  Filled 2021-01-22: qty 30, 5d supply, fill #0

## 2021-01-22 NOTE — Telephone Encounter (Signed)
appt sch'd with Malachy Mood for today

## 2021-01-22 NOTE — Telephone Encounter (Signed)
Per initial encounter, "Pt calling back. Took home covid test and it was positive. She would like to know what she should do next. Pt hoping to get meds for the covid. Pt went to work yesterday"; contacted pt to discuss her symptoms; the pt says her symptoms started on 01/18/21; she has an itchy throat, runny nose,cough, runny eyes, and  productive cough; her cough wakes her up at night;tier of symptoms reviewed; recommendations made per nurse triage protocol; decision tree completed; pt offered and accepted virtual appt with Delsa Grana, [place], 01/23/21 at 1320; her best contact number is [phone]; the pt would also like to discuss if she needs additional meds for COVID; she also inquired about when she can return to work; pt advised to contact her employer regarding her return to work; will route to office for notification.  Reason for Disposition . [1] HIGH RISK for severe COVID complications (e.g., weak immune system, age > 63 years, obesity with BMI > 25, pregnant, chronic lung disease or other chronic medical condition) AND [2] COVID symptoms (e.g., cough, fever)  (Exceptions: Already seen by PCP and no new or worsening symptoms.)  Answer Assessment - Initial Assessment Questions 1. COVID-19 DIAGNOSIS: "Who made your COVID-19 diagnosis?" "Was it confirmed by a positive lab test or self-test?" If not diagnosed by a doctor (or NP/PA), ask "Are there lots of cases (community spread) where you live?" Note: See public health department website, if unsure.     Home test 01/22/21 2. COVID-19 EXPOSURE: "Was there any known exposure to COVID before the symptoms began?" CDC Definition of close contact: within 6 feet (2 meters) for a total of 15 minutes or more over a 24-hour period.       3. ONSET: "When did the COVID-19 symptoms start?"      01/18/21 4. WORST SYMPTOM: "What is your worst symptom?" (e.g., cough, fever, shortness of breath, muscle aches)  cough 5. COUGH: "Do you have a  cough?" If Yes, ask: "How bad is the cough?"       yes; wakes pt up 6. FEVER: "Do you have a fever?" If Yes, ask: "What is your temperature, how was it measured, and when did it start?"     no 7. RESPIRATORY STATUS: "Describe your breathing?" (e.g., shortness of breath, wheezing, unable to speak)  Breathing normal 8. BETTER-SAME-WORSE: "Are you getting better, staying the same or getting worse compared to yesterday?"  If getting worse, ask, "In what way?"     9. HIGH RISK DISEASE: "Do you have any chronic medical problems?" (e.g., asthma, heart or lung disease, weak immune system, obesity, etc.)     A-fib, CHF, hx bronchitis 10. VACCINE: "Have you had the COVID-19 vaccine?" If Yes, ask: "Which one, how many shots, when did you get it?"     Moderna 07/06/20, 09/28/20 11. BOOSTER: "Have you received your COVID-19 booster?" If Yes, ask: "Which one and when did you get it?"       Moderna 10/31/20 12. PREGNANCY: "Is there any chance you are pregnant?" "When was your last menstrual period?"     no 13. OTHER SYMPTOMS: "Do you have any other symptoms?"  (e.g., chills, fatigue, headache, loss of smell or taste, muscle pain, sore throat)     Itchy throat, runny eyes 14. O2 SATURATION MONITOR:  "Do you use an oxygen saturation monitor (pulse oximeter) at home?" If Yes, ask "What is your reading (oxygen level) today?" "What is your usual oxygen saturation reading?" (e.g., 95%)  Protocols used: CORONAVIRUS (COVID-19) DIAGNOSED OR SUSPECTED-A-AH

## 2021-01-22 NOTE — Progress Notes (Signed)
There were no vitals taken for this visit.   Subjective:    Patient ID: [name], female    DOB: 05/18/1941, 80 y.o.   MRN: [id number]  HPI: [name] is a 80 y.o. female  Chief Complaint  Patient presents with  . Covid Positive   Outpatient Oral COVID Treatment Note  I connected with [name] on 01/22/2021/9:48 AM by telephone and verified that I am speaking with the correct person using two identifiers.  I discussed the limitations, risks, security, and privacy concerns of performing an evaluation and management service by telephone and the availability of in person appointments. I also discussed with the patient that there may be a patient responsible charge related to this service. The patient expressed understanding and agreed to proceed.  Patient location: home Provider location: home  Diagnosis: COVID-19 infection  Purpose of visit: Discussion of potential use of Molnupiravir or Paxlovid, a new treatment for mild to moderate COVID-19 viral infection in non-hospitalized patients.   Subjective: Patient is a 80 y.o. female who has been diagnosed with COVID 19 viral infection.  Their symptoms began on 5/14 with sinus drainage.    Past Medical History:  Diagnosis Date  . Aortic stenosis    s/p AVR in 10/2012  . Atrial fibrillation [facility])    h/o cardioversion  . Breast cancer ([place]) 1992   positive, radiation  . Cardiomyopathy ([place])   . CHF (congestive heart failure) ([place])   . Chronic kidney disease    Stage III  . COPD (chronic obstructive pulmonary disease) ([place])   . Gout   . Hyperlipidemia   . Hypertension   . Murmur   . Pulmonary HTN ([place])   . Valvular heart disease    s/p AV replacement and MV repair in 10/2012    Allergies  Allergen Reactions  . Ace Inhibitors Cough and Other (See Comments)  . Amoxicillin-Pot Clavulanate Nausea And Vomiting  . Codeine Nausea And Vomiting  . Penicillins Nausea And Vomiting and Other (See Comments)     Has patient had a PCN reaction causing immediate rash, facial/tongue/throat swelling, SOB or lightheadedness with hypotension: No Has patient had a PCN reaction causing severe rash involving mucus membranes or skin necrosis: No Has patient had a PCN reaction that required hospitalization No Has patient had a PCN reaction occurring within the last 10 years: No If all of the above answers are "NO", then may proceed with Cephalosporin use.  . Rosuvastatin Other (See Comments)    Reaction:  Joint pain   . Tetanus-Diphtheria Toxoids Td Swelling     Current Outpatient Medications:  .  acetaminophen (TYLENOL) 500 MG tablet, Take 1,000 mg by mouth every 6 (six) hours as needed for mild pain or moderate pain. , Disp: , Rfl:  .  allopurinol (ZYLOPRIM) 300 MG tablet, Take 1 tablet (300 mg total) by mouth daily., Disp: 90 tablet, Rfl: 1 .  Ergocalciferol (VITAMIN D2) 50 MCG (2000 UT) TABS, Take 2,000 Units by mouth daily., Disp: , Rfl:  .  ezetimibe (ZETIA) 10 MG tablet, TAKE 1 TABLET BY MOUTH EVERY DAY (Patient taking differently: Take 10 mg by mouth daily.), Disp: 90 tablet, Rfl: 3 .  metoprolol tartrate (LOPRESSOR) 25 MG tablet, Take 1 tablet (25 mg total) by mouth 2 (two) times daily., Disp: 180 tablet, Rfl: 0 .  Omega 3 1000 MG CAPS, Take 1,000 mg by mouth daily., Disp: , Rfl:  .  potassium chloride SA (KLOR-CON M20) 20 MEQ  tablet, Take 1 tablet (20 mEq total) by mouth 2 (two) times daily., Disp: 180 tablet, Rfl: 0 .  tiotropium (SPIRIVA HANDIHALER) 18 MCG inhalation capsule, INHALE 1 CASPULE VIA HANDIHALER ONCE A DAY AT THE SAME TIME EVERY DAY (Patient taking differently: Place 18 mcg into inhaler and inhale daily.), Disp: 90 capsule, Rfl: 1 .  torsemide (DEMADEX) 20 MG tablet, TAKE 1 TABLET BY MOUTH EVERY DAY, Disp: 30 tablet, Rfl: 1 .  traZODone (DESYREL) 50 MG tablet, Take 1 tablet (50 mg total) by mouth at bedtime as needed., Disp: 90 tablet, Rfl: 1 .  warfarin (COUMADIN) 5 MG tablet, Take 5-7.5  mg by mouth See admin instructions. Take 5 mg on Mon., Wed., Sunday. All the other days take 7.5 mg, Disp: , Rfl:  .  colchicine 0.6 MG tablet, Take 1-2 tablets (0.6-1.2 mg total) by mouth daily. And repeat in one hour for gout attacks prn (Patient not taking: No sig reported), Disp: 30 tablet, Rfl: 0  Objective: Patient appears/sounds congested.  They are in no apparent distress.  Breathing is non labored.  Mood and behavior are normal.  Laboratory Data:  No results found for this or any previous visit (from the past 2160 hour(s)).   Assessment: 80 y.o. female with mild/moderate COVID 19 viral infection diagnosed on 5/14 at high risk for progression to severe COVID 19.  Plan:  This patient is a 80 y.o. female that meets the following criteria for Emergency Use Authorization of: Paxlovid 1. Age >12 yr AND > 40 kg 2. SARS-COV-2 positive test 3. Symptom onset < 5 days 4. Mild-to-moderate COVID disease with high risk for severe progression to hospitalization or death  I have spoken and communicated the following to the patient or parent/caregiver regarding: 1. Paxlovid is an unapproved drug that is authorized for use under an Emergency Use Authorization.  2. There are no adequate, approved, available products for the treatment of COVID-19 in adults who have mild-to-moderate COVID-19 and are at high risk for progressing to severe COVID-19, including hospitalization or death. 3. Other therapeutics are currently authorized. For additional information on all products authorized for treatment or prevention of COVID-19, please see [email or web address].  4. There are benefits and risks of taking this treatment as outlined in the "Fact Sheet for Patients and Caregivers."  5. "Fact Sheet for Patients and Caregivers" was reviewed with patient. A hard copy will be provided to patient from pharmacy prior to  the patient receiving treatment. 6. Patients should continue to self-isolate and use infection control measures (e.g., wear mask, isolate, social distance, avoid sharing personal items, clean and disinfect "high touch" surfaces, and frequent handwashing) according to CDC guidelines.  7. The patient or parent/caregiver has the option to accept or refuse treatment. 8. Patient medication history was reviewed for potential drug interactions:No drug interactions 9. Patient's GFR was calculated to be >60, and they were therefore prescribed Normal dose (GFR>60) - nirmatrelvir 150mg  tab (2 tablet) by mouth twice daily AND ritonavir 100mg  tab (1 tablet) by mouth twice daily   After reviewing above information with the patient, the patient agrees to receive Paxlovid.  Follow up instructions:    . Take prescription BID x 5 days as directed . Reach out to pharmacist for counseling on medication if desired . For concerns regarding further COVID symptoms please follow up with your PCP or urgent care . For urgent or life-threatening issues, seek care at your local emergency department  The patient was provided an opportunity to ask questions,  and all were answered. The patient agreed with the plan and demonstrated an understanding of the instructions.   Script sent to [facility] and opted to pick up RX.  The patient was advised to call their PCP or seek an in-person evaluation if the symptoms worsen or if the condition fails to improve as anticipated.   I provided 20 minutes of non face-to-face telephone visit time during this encounter, and > 50% was spent counseling as documented under my assessment & plan.  Kathrine Haddock, NP 01/22/2021 /9:48 AM

## 2021-01-23 ENCOUNTER — Telehealth: Payer: BC Managed Care – PPO | Admitting: Family Medicine

## 2021-01-25 ENCOUNTER — Telehealth: Payer: Self-pay

## 2021-01-25 NOTE — Telephone Encounter (Signed)
Copied from [place] [id number]. Topic: General - Other >> Jan 25, 2021  9:40 AM Keene Breath wrote: Reason for CRM: Patient called to ask if she could get a doctor note for her employer since she still has covid. Please advise and call to let patient know at [phone]

## 2021-01-28 DIAGNOSIS — I480 Paroxysmal atrial fibrillation: Secondary | ICD-10-CM | POA: Diagnosis not present

## 2021-01-28 NOTE — Telephone Encounter (Signed)
Lvm for patient.

## 2021-02-13 DIAGNOSIS — I480 Paroxysmal atrial fibrillation: Secondary | ICD-10-CM | POA: Diagnosis not present

## 2021-03-01 NOTE — Progress Notes (Signed)
Name: [name]   MRN: [id number]    DOB: [birth year]   Date:03/04/2021       Progress Note  Subjective  Chief Complaint  Follow Up  HPI  Chronic combined CHF: she is on Demadex, potassium and magnesium daily, leg cramps still present but not as severe. Off digoxin but is taking metoprolol for rate control.  No chest , but has some with moderate activity , she denies orthopnea, denies lower extremity edema. She is not on ACE because of cough, she was on Losartan for a period of time but bp dropped so cardiologist discontinued medications . She is up to date Dr. Clayborn Bigness.     HTN: taking metoprolol, bp is well controlled  No chest pain she has mild SOB with moderate activity. She  is compliant with medication    Hyperlipidemia: she stopped taking Atorvastatin but is taking Zetia, we sent rx of Repatha but she states states she does not want injectables at this time  Reviewed last labs with patient    Insomnia: she takes Trazodone prn usually just half a pill , most nights she sleeps without medication. Unchanged    Gout: no recent episodes, on Allopurinol , last time she had a flare had steroid injection, she states elbow does not feel like gout   Paget's disease of left breast: she noticed a growth on left nipple Fall 2021 , she came in after a couple of months, was referred Dr. Bary Castilla had a biopsy followed by excisional biopsy and had 4 weeks of radiation with Dr. Donella Stade , she finished last week, doing well . No problems    Dysmetabolic Syndrome:  She denies polyphagia, polyuria or polydipsia Last hgbA1C was 5.7%, she is on a low carb diet and has lost 6 lbs since last visit, she is happy with results.    Afib: rate controlled, no fluttering sensation, taking betablocker and off digoxin, also on coumadin and goes to coumadin clinic/Dr. Callwood. Denies bleeding or bruising , no blood in stools    Chronic bronchitis :  She states she no longer coughing in the mornings, she only has  SOB only intermittently with moderate activity , she quit smoking in 2010. She uses Spiriva daily , denies recent wheezing, except during COVID back in June 2022    Depression: she has a long history of depression, used to take SSRI's, symptoms now are intermittent and has been in remission for months, only symptoms is difficulty sleeping and takes trazodone prn only, occasionally only  . She denies suicidal thoughts or ideation. She is motivated, working part time and is feeling well. Still in remission   Left elbow/arm swelling: no signficant pain, just swollen and red over the past 4 days, no trauma, not taking any  medications for it, does not feel like gout, denies fever or chills.   Hyperparathyroidism: she saw nephrologist and was referred to Endo, possible primary versus secondary hyperparathyroidism, but at this time Dr. Honor Junes recommends just to monitor .   CKI stage III: she is aware to avoid nsaid's. However due to erythema on lett elbow we will try 7 days of celebrex instead of giving her prednisone, if no improvement will have to see ortho ( dr. Sabra Heck ) for possible I&D  Patient Active Problem List   Diagnosis Date Noted   Paget's disease of right breast ([place]) 07/18/2020   Tibialis posterior tendinitis 03/08/2020   Morbid obesity ([place]) 01/05/2020   On Coumadin for atrial fibrillation ([place]) 06/30/2018  Chronic anticoagulation 08/27/2017   History of right breast cancer 08/27/2017   Chronic obstructive pulmonary disease ([place]) 07/04/2015   Controlled gout 06/06/2015   Anxiety 02/09/2015   Synovial cyst of popliteal space 02/09/2015   Benign hypertension 02/09/2015   Blood type A+ 02/09/2015   Arterial branch occlusion of retina 02/09/2015   Chronic constipation 02/09/2015   Insomnia, persistent 02/09/2015   Chronic combined systolic and diastolic CHF, NYHA class 1 ([place]) 02/09/2015   Dyslipidemia 02/09/2015   Atrial fibrillation ([place]) 02/09/2015   Arthritis urica  02/09/2015   Hearing loss 02/09/2015   History of open heart surgery 02/09/2015   Chronic hoarseness 02/09/2015   Cardiomegaly 50/35/4656   Dysmetabolic syndrome 81/27/5170   Migraine with aura and without status migrainosus, not intractable 02/09/2015   Hypertensive pulmonary vascular disease ([place]) 02/09/2015   Allergic rhinitis, seasonal 02/09/2015   Vitamin D deficiency 02/09/2015   History of mitral valve repair 10/01/2012   History of aortic valve replacement 10/01/2012   Congestive heart failure ([place]) 10/01/2012    Past Surgical History:  Procedure Laterality Date   ABDOMINAL HYSTERECTOMY  1996   arm surgery  2019   BREAST BIOPSY Right 1998   neg   BREAST BIOPSY Right 2007   neg   BREAST BIOPSY Right 1992   positive   BREAST EXCISIONAL BIOPSY Left 2000   neg   BREAST LUMPECTOMY Right 1992   BREAST LUMPECTOMY Left 08/17/2020   Procedure: BREAST LUMPECTOMY;  Surgeon: Robert Bellow, MD;  Location: [facility] ORS;  Service: General;  Laterality: Left;   CARDIAC VALVE REPLACEMENT  2014   CATARACT EXTRACTION W/PHACO Left 03/30/2018   Procedure: CATARACT EXTRACTION PHACO AND INTRAOCULAR LENS PLACEMENT ([place]);  Surgeon: Birder Robson, MD;  Location: [facility] ORS;  Service: Ophthalmology;  Laterality: Left;  [place] 00:31.9 AP% 12.1 CDE 3.87 Fluid Pack lot # [id number]   CESAREAN SECTION     FRACTURE SURGERY Left    ARM/ LEG   LEG SURGERY      Family History  Problem Relation Age of Onset   Cancer Mother    Diabetes Mother    Hypertension Mother    Breast cancer Mother 40   Hypertension Father    Heart failure Father    Cancer Brother        bladder   Cancer Sister        breast   Breast cancer Sister 80   Hypertension Son    Hypertension Daughter    Breast cancer Sister 62    Social History   Tobacco Use   Smoking status: Former    Pack years: 0.00    Types: Cigarettes    Quit date: 09/09/2007    Years since quitting: 13.4   Smokeless tobacco: Never  Substance  Use Topics   Alcohol use: No    Alcohol/week: 0.0 standard drinks     Current Outpatient Medications:    acetaminophen (TYLENOL) 500 MG tablet, Take 1,000 mg by mouth every 6 (six) hours as needed for mild pain or moderate pain. , Disp: , Rfl:    allopurinol (ZYLOPRIM) 300 MG tablet, Take 1 tablet (300 mg total) by mouth daily., Disp: 90 tablet, Rfl: 1   Ergocalciferol (VITAMIN D2) 50 MCG (2000 UT) TABS, Take 2,000 Units by mouth daily., Disp: , Rfl:    ezetimibe (ZETIA) 10 MG tablet, TAKE 1 TABLET BY MOUTH EVERY DAY (Patient taking differently: Take 10 mg by mouth daily.), Disp: 90 tablet, Rfl: 3   metoprolol  tartrate (LOPRESSOR) 25 MG tablet, Take 1 tablet (25 mg total) by mouth 2 (two) times daily., Disp: 180 tablet, Rfl: 0   Omega 3 1000 MG CAPS, Take 1,000 mg by mouth daily., Disp: , Rfl:    potassium chloride SA (KLOR-CON M20) 20 MEQ tablet, Take 1 tablet (20 mEq total) by mouth 2 (two) times daily., Disp: 180 tablet, Rfl: 0   tiotropium (SPIRIVA HANDIHALER) 18 MCG inhalation capsule, INHALE 1 CASPULE VIA HANDIHALER ONCE A DAY AT THE SAME TIME EVERY DAY (Patient taking differently: Place 18 mcg into inhaler and inhale daily.), Disp: 90 capsule, Rfl: 1   torsemide (DEMADEX) 20 MG tablet, TAKE 1 TABLET BY MOUTH EVERY DAY, Disp: 30 tablet, Rfl: 1   traZODone (DESYREL) 50 MG tablet, Take 1 tablet (50 mg total) by mouth at bedtime as needed., Disp: 90 tablet, Rfl: 1   warfarin (COUMADIN) 5 MG tablet, Take 5-7.5 mg by mouth See admin instructions. Take 5 mg on Mon., Wed., Sunday. All the other days take 7.5 mg, Disp: , Rfl:   Allergies  Allergen Reactions   Ace Inhibitors Cough and Other (See Comments)   Amoxicillin-Pot Clavulanate Nausea And Vomiting   Codeine Nausea And Vomiting   Penicillins Nausea And Vomiting and Other (See Comments)    Has patient had a PCN reaction causing immediate rash, facial/tongue/throat swelling, SOB or lightheadedness with hypotension: No Has patient had a PCN  reaction causing severe rash involving mucus membranes or skin necrosis: No Has patient had a PCN reaction that required hospitalization No Has patient had a PCN reaction occurring within the last 10 years: No If all of the above answers are "NO", then may proceed with Cephalosporin use.   Rosuvastatin Other (See Comments)    Reaction:  Joint pain    Tetanus-Diphtheria Toxoids Td Swelling    I personally reviewed active problem list, medication list, allergies, family history, social history, health maintenance, notes from last encounter with the patient/caregiver today.   ROS  Constitutional: Negative for fever or weight change.  Respiratory: Negative for cough and shortness of breath.   Cardiovascular: Negative for chest pain or palpitations.  Gastrointestinal: Negative for abdominal pain, no bowel changes.  Musculoskeletal: Negative for gait problem or joint swelling.  Skin: Negative for rash.  Neurological: Negative for dizziness or headache.  No other specific complaints in a complete review of systems (except as listed in HPI above).   Objective  Vitals:   03/04/21 1420  BP: 136/76  Pulse: 93  Resp: 16  Temp: 98.1 F (36.7 C)  SpO2: 99%  Weight: 181 lb 1.6 oz (82.1 kg)  Height: 5\' 3"  (1.6 m)    Body mass index is 32.08 kg/m.  Physical Exam  Constitutional: Patient appears well-developed and well-nourished. Obese  No distress.  HEENT: head atraumatic, normocephalic, pupils equal and reactive to light,  neck supple Cardiovascular: Normal rate, irregular rhythm and normal heart sounds.  3/6 murmur heard. No BLE edema. Pulmonary/Chest: Effort normal and breath sounds normal. No respiratory distress. Abdominal: Soft.  There is no tenderness. Muscular skeletal: swollen to  palpation of left olecranon bursa, some redness , mild tenderness to touch  Psychiatric: Patient has a normal mood and affect. behavior is normal. Judgment and thought content normal.     PHQ2/9: Depression screen [facility] 2/9 03/04/2021 01/22/2021 10/31/2020 06/28/2020 02/20/2020  Decreased Interest 0 0 0 0 0  Down, Depressed, Hopeless 0 0 0 0 0  PHQ - 2 Score 0 0 0 0 0  Altered sleeping 0 - - - 0  Tired, decreased energy 0 - - - 0  Change in appetite 0 - - - 0  Feeling bad or failure about yourself  0 - - - 0  Trouble concentrating 0 - - - 0  Moving slowly or fidgety/restless 0 - - - 0  Suicidal thoughts 0 - - - 0  PHQ-9 Score 0 - - - 0  Difficult doing work/chores Not difficult at all - - - -  Some recent data might be hidden    phq 9 is negative   Fall Risk: Fall Risk  03/04/2021 01/22/2021 10/31/2020 06/28/2020 02/20/2020  Falls in the past year? 0 0 0 0 0  Number falls in past yr: 0 0 0 0 0  Injury with Fall? 0 0 0 0 0  Comment - - - - -  Risk for fall due to : - - - - -  Follow up - Falls evaluation completed - - -      Functional Status Survey: Is the patient deaf or have difficulty hearing?: No Does the patient have difficulty seeing, even when wearing glasses/contacts?: No Does the patient have difficulty concentrating, remembering, or making decisions?: No Does the patient have difficulty walking or climbing stairs?: No Does the patient have difficulty dressing or bathing?: No Does the patient have difficulty doing errands alone such as visiting a doctor's office or shopping?: No    Assessment & Plan   1. Olecranon bursitis of left elbow  - celecoxib (CELEBREX) 200 MG capsule; Take 1 capsule (200 mg total) by mouth daily.  Dispense: 7 capsule; Refill: 0  2. Simple chronic bronchitis (HCC)  Stable  3. Paroxysmal atrial fibrillation (HCC)  - metoprolol tartrate (LOPRESSOR) 25 MG tablet; Take 1 tablet (25 mg total) by mouth 2 (two) times daily.  Dispense: 180 tablet; Refill: 1  4. Pulmonary hypertension ([place])   5. Chronic combined systolic and diastolic congestive heart failure ([place])  Under the care of cardiologist   6. Vitamin D  deficiency   7. Benign hypertension  - torsemide (DEMADEX) 20 MG tablet; Take 1 tablet (20 mg total) by mouth daily.  Dispense: 90 tablet; Refill: 1  8. Statin myopathy   9. Chronic combined systolic and diastolic CHF, NYHA class 1 (HCC)  - torsemide (DEMADEX) 20 MG tablet; Take 1 tablet (20 mg total) by mouth daily.  Dispense: 90 tablet; Refill: 1 - potassium chloride SA (KLOR-CON M20) 20 MEQ tablet; Take 1 tablet (20 mEq total) by mouth 2 (two) times daily.  Dispense: 180 tablet; Refill: 0  10. Insomnia, unspecified type  - traZODone (DESYREL) 50 MG tablet; Take 1 tablet (50 mg total) by mouth at bedtime as needed.  Dispense: 90 tablet; Refill: 1  11. Controlled gout  - allopurinol (ZYLOPRIM) 300 MG tablet; Take 1 tablet (300 mg total) by mouth daily.  Dispense: 90 tablet; Refill:

## 2021-03-04 ENCOUNTER — Encounter: Payer: Self-pay | Admitting: Family Medicine

## 2021-03-04 ENCOUNTER — Other Ambulatory Visit: Payer: Self-pay

## 2021-03-04 ENCOUNTER — Ambulatory Visit (INDEPENDENT_AMBULATORY_CARE_PROVIDER_SITE_OTHER): Payer: Medicare Other | Admitting: Family Medicine

## 2021-03-04 VITALS — BP 136/76 | HR 93 | Temp 98.1°F | Resp 16 | Ht 63.0 in | Wt 181.1 lb

## 2021-03-04 DIAGNOSIS — I10 Essential (primary) hypertension: Secondary | ICD-10-CM | POA: Diagnosis not present

## 2021-03-04 DIAGNOSIS — I272 Pulmonary hypertension, unspecified: Secondary | ICD-10-CM | POA: Diagnosis not present

## 2021-03-04 DIAGNOSIS — I480 Paroxysmal atrial fibrillation: Secondary | ICD-10-CM | POA: Diagnosis not present

## 2021-03-04 DIAGNOSIS — G720 Drug-induced myopathy: Secondary | ICD-10-CM | POA: Diagnosis not present

## 2021-03-04 DIAGNOSIS — E559 Vitamin D deficiency, unspecified: Secondary | ICD-10-CM

## 2021-03-04 DIAGNOSIS — T466X5A Adverse effect of antihyperlipidemic and antiarteriosclerotic drugs, initial encounter: Secondary | ICD-10-CM

## 2021-03-04 DIAGNOSIS — J410 Simple chronic bronchitis: Secondary | ICD-10-CM

## 2021-03-04 DIAGNOSIS — M7022 Olecranon bursitis, left elbow: Secondary | ICD-10-CM | POA: Diagnosis not present

## 2021-03-04 DIAGNOSIS — M109 Gout, unspecified: Secondary | ICD-10-CM | POA: Diagnosis not present

## 2021-03-04 DIAGNOSIS — I5042 Chronic combined systolic (congestive) and diastolic (congestive) heart failure: Secondary | ICD-10-CM | POA: Diagnosis not present

## 2021-03-04 DIAGNOSIS — G4700 Insomnia, unspecified: Secondary | ICD-10-CM

## 2021-03-04 MED ORDER — POTASSIUM CHLORIDE CRYS ER 20 MEQ PO TBCR: 20.0000 meq | EXTENDED_RELEASE_TABLET | Freq: Two times a day (BID) | ORAL | 0 refills | Status: DC

## 2021-03-04 MED ORDER — CELECOXIB 200 MG PO CAPS: 200.0000 mg | ORAL_CAPSULE | Freq: Every day | ORAL | 0 refills | Status: DC

## 2021-03-04 MED ORDER — TRAZODONE HCL 50 MG PO TABS: 50.0000 mg | ORAL_TABLET | Freq: Every evening | ORAL | 1 refills | Status: DC | PRN

## 2021-03-04 MED ORDER — ALLOPURINOL 300 MG PO TABS: 300.0000 mg | ORAL_TABLET | Freq: Every day | ORAL | 1 refills | Status: DC

## 2021-03-04 MED ORDER — TORSEMIDE 20 MG PO TABS: 20.0000 mg | ORAL_TABLET | Freq: Every day | ORAL | 1 refills | Status: DC

## 2021-03-04 MED ORDER — METOPROLOL TARTRATE 25 MG PO TABS: 25.0000 mg | ORAL_TABLET | Freq: Two times a day (BID) | ORAL | 1 refills | Status: DC

## 2021-03-20 IMAGING — MG DIGITAL DIAGNOSTIC BILAT W/ TOMO W/ CAD
6 of 10 series · 6 of 30 positions shown · non-contrast
Comparison: Previous exam(s).

CLINICAL DATA: 79-year-old female with recent history of changes to
the left nipple. This was biopsied by her surgeon Dr. Giriutas and
found to represent Paget's disease. The patient presents today for
diagnostic workup.

EXAM:
DIGITAL DIAGNOSTIC BILATERAL MAMMOGRAM WITH CAD AND TOMO
ULTRASOUND LEFT BREAST

[R CC synth-2D]
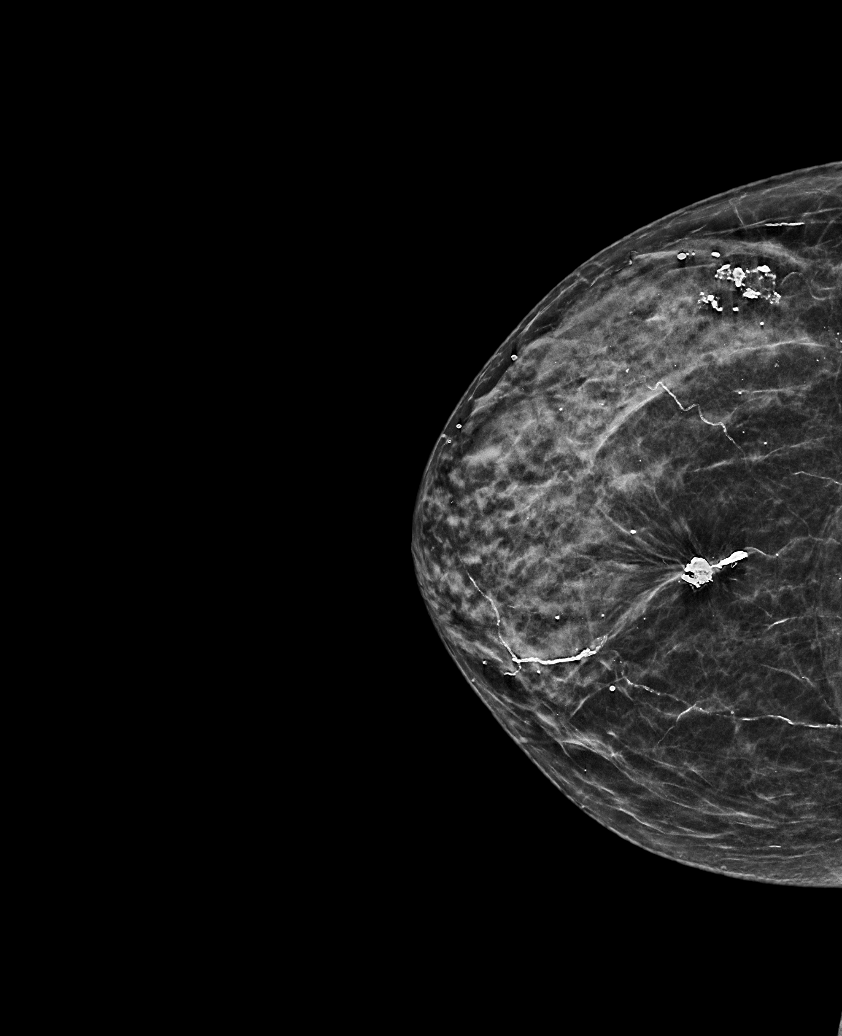

[L MLO synth-2D]
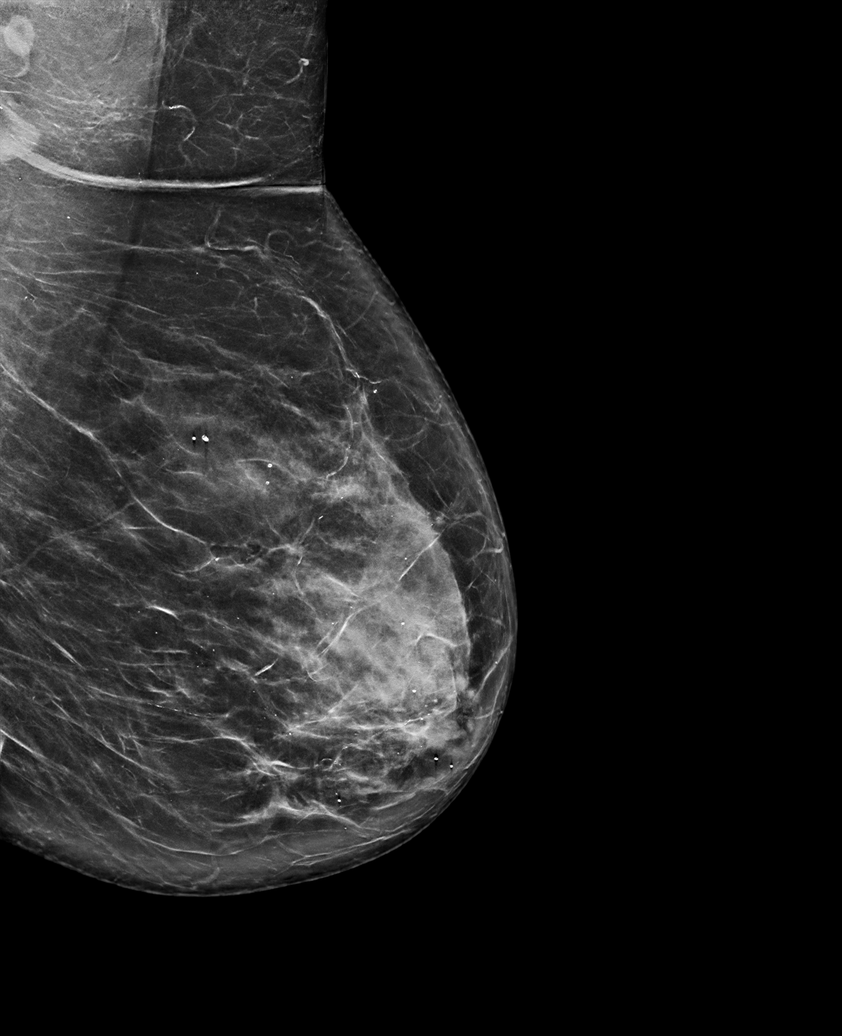

[R MLO synth-2D]
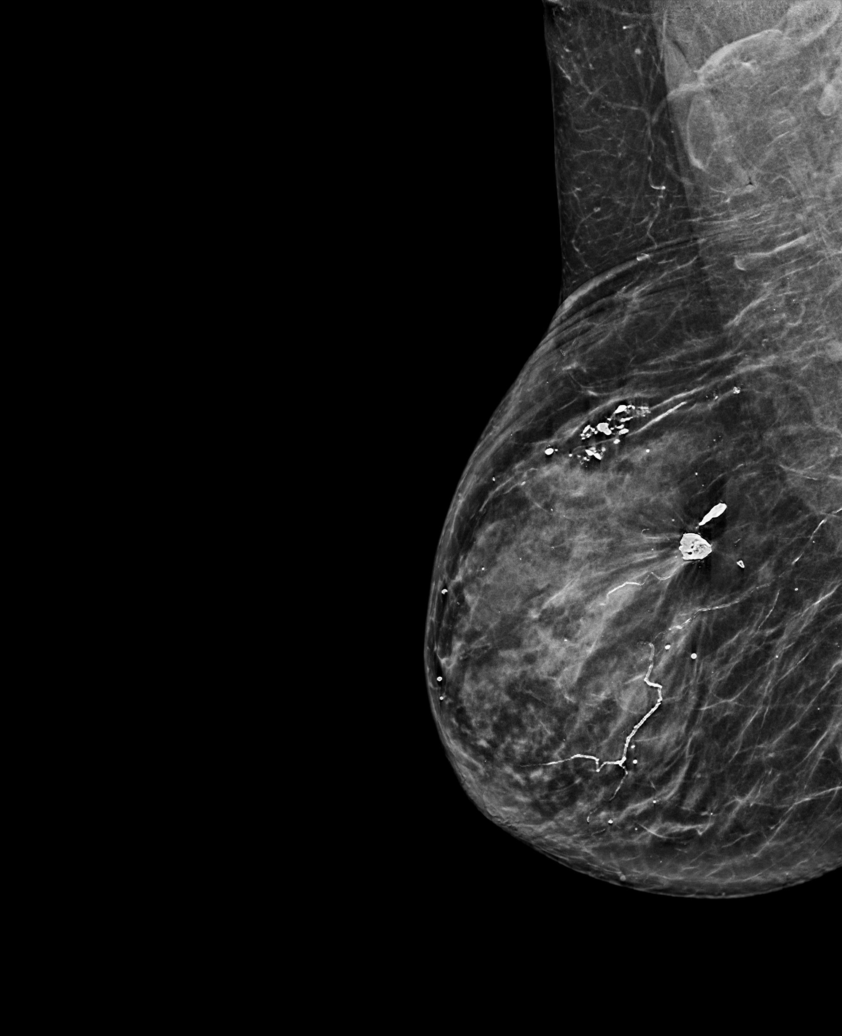

[L CC synth-2D (1 of 2)]
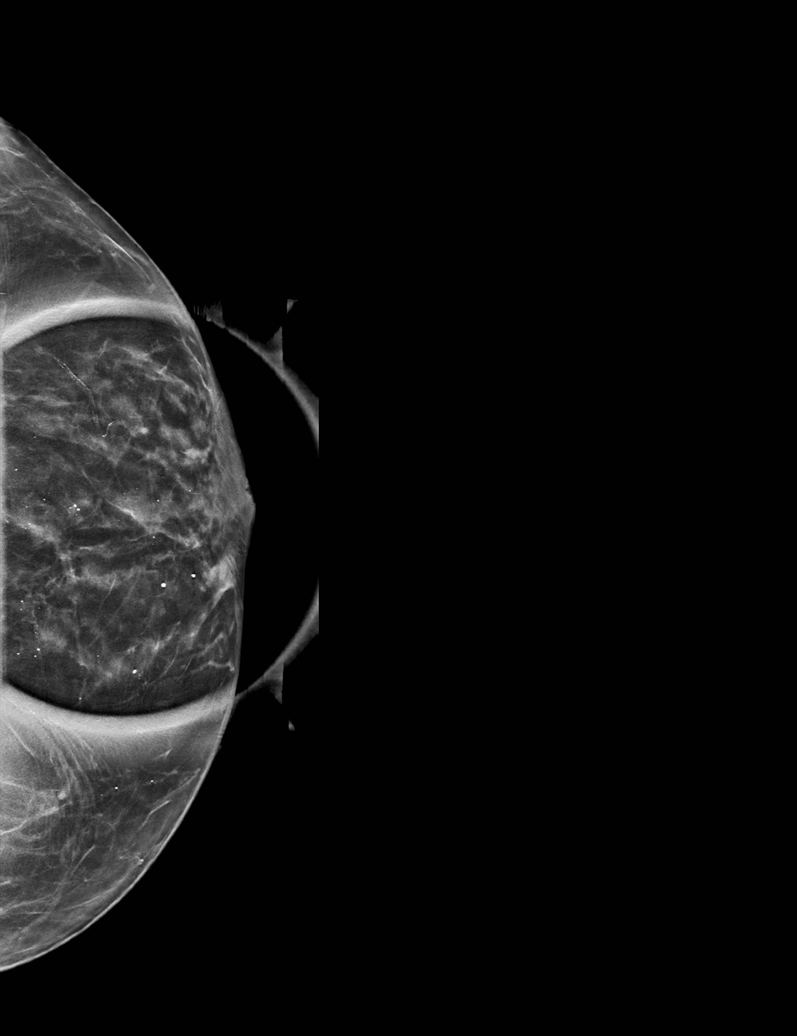

[L CC synth-2D (2 of 2)]
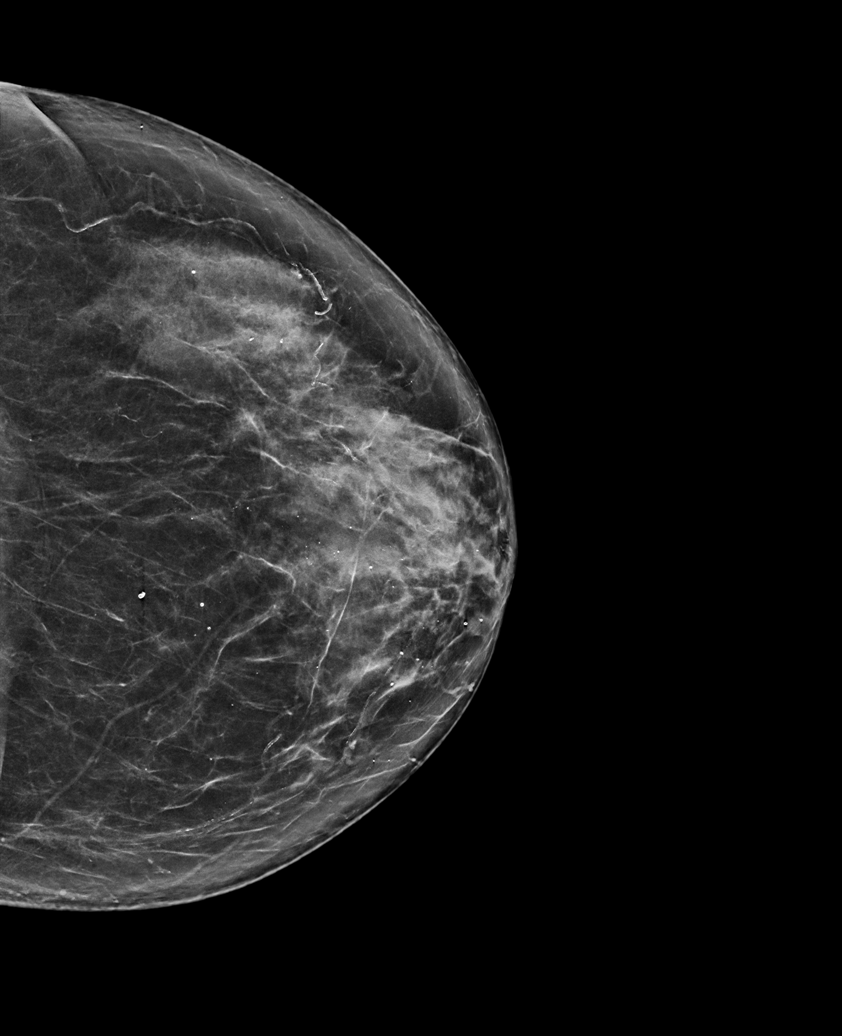

[L CC tomo · tomo slice 41/81.0]
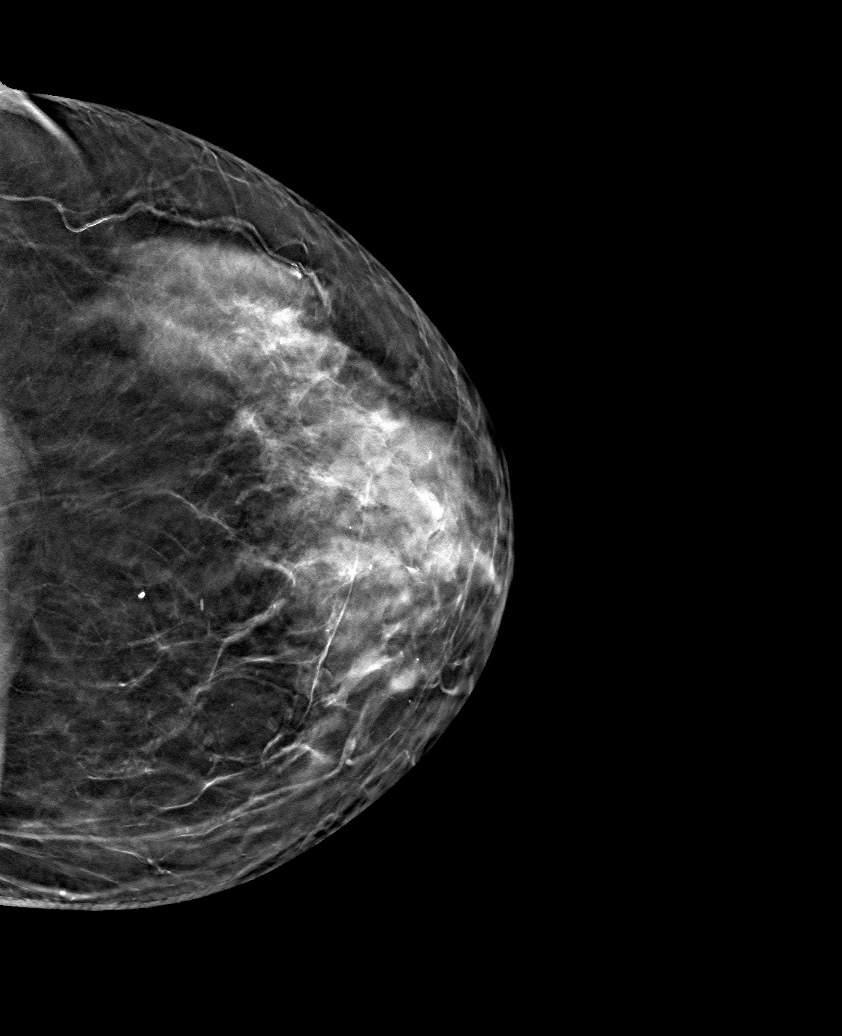

[6 of 30 positions shown; findings below may reference images not displayed]

ACR Breast Density Category c: The breast tissue is heterogeneously
dense, which may obscure small masses.
FINDINGS: No suspicious mammographic findings are identified in either breast.
The parenchymal pattern is stable. Coarse dystrophic calcifications
and postoperative changes are stable in the right breast. Diffuse,
scattered punctate and round calcifications in the left breast are
mammographically stable.

Mammographic images were processed with CAD.

On physical exam, the left nipple appears pink and raw with
overlying Sharif.

Targeted ultrasound is performed, showing heterogenous
fibroglandular tissue without focal or suspicious sonographic
abnormality. Extensive evaluation of the retro and periareolar left
breast was performed.
IMPRESSION: 1. No mammographic evidence of malignancy in either breast.
2. No suspicious sonographic findings in the subareolar left breast.

RECOMMENDATION:
Recommendation is for contrast enhanced breast MRI for further
evaluation of mammographically/sonographically occult malignancy in
the subareolar left breast.

I have discussed the findings and recommendations with the patient.
If applicable, a reminder letter will be sent to the patient
regarding the next appointment.

BI-RADS CATEGORY  6: Known biopsy-proven malignancy.

## 2021-03-28 ENCOUNTER — Other Ambulatory Visit: Payer: Self-pay | Admitting: Family Medicine

## 2021-03-28 DIAGNOSIS — J410 Simple chronic bronchitis: Secondary | ICD-10-CM

## 2021-03-28 DIAGNOSIS — I480 Paroxysmal atrial fibrillation: Secondary | ICD-10-CM | POA: Diagnosis not present

## 2021-04-02 ENCOUNTER — Telehealth: Payer: Self-pay

## 2021-04-02 ENCOUNTER — Other Ambulatory Visit: Payer: Self-pay | Admitting: Family Medicine

## 2021-04-02 DIAGNOSIS — M7022 Olecranon bursitis, left elbow: Secondary | ICD-10-CM

## 2021-04-02 MED ORDER — CELECOXIB 200 MG PO CAPS: 200.0000 mg | ORAL_CAPSULE | Freq: Every day | ORAL | 0 refills | Status: DC

## 2021-04-02 NOTE — Telephone Encounter (Signed)
Copied from [place] [id number]. Topic: General - Inquiry >> Apr 02, 2021  2:19 PM Loma [place] wrote: Reason for CRM: Pt was just seen on 6-27 for burrsitus in left elbow and was given celecoxib (CELEBREX) 200 MG capsule 7 capsule 0 03/04/2021. She states it helped tremendously and does not hardly bother at all. Pt states today she had a flare up in right and had to come home from work. Requesting since was just seen 3 wks ago and this med did so well could she have a script written for the other elbow now without an appt? If so pharmacy [id number] [place], [id number]: [phone]: [phone]

## 2021-04-05 DIAGNOSIS — M25421 Effusion, right elbow: Secondary | ICD-10-CM | POA: Diagnosis not present

## 2021-04-12 DIAGNOSIS — L0390 Cellulitis, unspecified: Secondary | ICD-10-CM | POA: Diagnosis not present

## 2021-04-18 DIAGNOSIS — L0390 Cellulitis, unspecified: Secondary | ICD-10-CM | POA: Insufficient documentation

## 2021-04-25 DIAGNOSIS — L0390 Cellulitis, unspecified: Secondary | ICD-10-CM | POA: Diagnosis not present

## 2021-04-25 DIAGNOSIS — I480 Paroxysmal atrial fibrillation: Secondary | ICD-10-CM | POA: Diagnosis not present

## 2021-05-08 ENCOUNTER — Ambulatory Visit
Admission: RE | Admit: 2021-05-08 | Discharge: 2021-05-08 | Disposition: A | Discharge status: Home / Self Care | Payer: Medicare Other | Source: Ambulatory Visit | Attending: Radiation Oncology | Admitting: Radiation Oncology

## 2021-05-08 ENCOUNTER — Encounter: Payer: Self-pay | Admitting: Radiation Oncology

## 2021-05-08 DIAGNOSIS — Z923 Personal history of irradiation: Secondary | ICD-10-CM | POA: Diagnosis not present

## 2021-05-08 DIAGNOSIS — C50012 Malignant neoplasm of nipple and areola, left female breast: Secondary | ICD-10-CM

## 2021-05-08 DIAGNOSIS — Z08 Encounter for follow-up examination after completed treatment for malignant neoplasm: Secondary | ICD-10-CM | POA: Diagnosis not present

## 2021-05-08 DIAGNOSIS — Z86000 Personal history of in-situ neoplasm of breast: Secondary | ICD-10-CM | POA: Diagnosis not present

## 2021-05-08 DIAGNOSIS — Z171 Estrogen receptor negative status [ER-]: Secondary | ICD-10-CM | POA: Diagnosis not present

## 2021-05-08 NOTE — Progress Notes (Signed)
Radiation Oncology Follow up Note  Name: [name]   Date:   05/08/2021 MRN:  [id number] DOB: 10/24/1940    This 80 y.o. female presents to the clinic today for 6-monthfollow-up status post whole breast radiation to her left breast for ductal carcinoma in situ.  REFERRING PROVIDER: SSteele Sizer MD  HPI: Patient is a 80year old female now about 6 months having completed whole breast radiation to her left breast for ER positive ductal carcinoma in situ.  Seen today in routine follow-up she is doing well.  She specifically denies breast tenderness cough or bone pain.  She is opted not to go on antiestrogen therapy..  COMPLICATIONS OF TREATMENT: none  FOLLOW UP COMPLIANCE: keeps appointments   PHYSICAL EXAM:  BP (!) (P) 145/78 (BP Location: Left Arm, Patient Position: Sitting)   Pulse (P) 67   Temp (P) 97.9 F (36.6 C) (Tympanic)   Resp (P) 18   Wt (P) 182 lb 14.4 oz (83 kg)   BMI (P) 32.40 kg/m  Lungs are clear to A&P cardiac examination essentially unremarkable with regular rate and rhythm. No dominant mass or nodularity is noted in either breast in 2 positions examined. Incision is well-healed. No axillary or supraclavicular adenopathy is appreciated. Cosmetic result is excellent.  Well-developed well-nourished patient in NAD. HEENT reveals PERLA, EOMI, discs not visualized.  Oral cavity is clear. No oral mucosal lesions are identified. Neck is clear without evidence of cervical or supraclavicular adenopathy. Lungs are clear to A&P. Cardiac examination is essentially unremarkable with regular rate and rhythm without murmur rub or thrill. Abdomen is benign with no organomegaly or masses noted. Motor sensory and DTR levels are equal and symmetric in the upper and lower extremities. Cranial nerves II through XII are grossly intact. Proprioception is intact. No peripheral adenopathy or edema is identified. No motor or sensory levels are noted. Crude visual fields are within normal  range.  RADIOLOGY RESULTS: No current films for review  PLAN: Present time patient is doing well 6 months out with no evidence of disease.  I am pleased with her overall progress.  I have asked to see her back in 6 months for follow-up.  Again she is opted not to take antiestrogen therapy.  Patient knows to call with any concerns.  I would like to take this opportunity to thank you for allowing me to participate in the care of your patient..Noreene Filbert MD

## 2021-05-14 DIAGNOSIS — I480 Paroxysmal atrial fibrillation: Secondary | ICD-10-CM | POA: Diagnosis not present

## 2021-05-27 DIAGNOSIS — I10 Essential (primary) hypertension: Secondary | ICD-10-CM | POA: Diagnosis not present

## 2021-05-27 DIAGNOSIS — I509 Heart failure, unspecified: Secondary | ICD-10-CM | POA: Diagnosis not present

## 2021-05-27 DIAGNOSIS — R011 Cardiac murmur, unspecified: Secondary | ICD-10-CM | POA: Diagnosis not present

## 2021-05-27 DIAGNOSIS — J410 Simple chronic bronchitis: Secondary | ICD-10-CM | POA: Diagnosis not present

## 2021-05-27 DIAGNOSIS — Z9889 Other specified postprocedural states: Secondary | ICD-10-CM | POA: Diagnosis not present

## 2021-05-27 DIAGNOSIS — Q231 Congenital insufficiency of aortic valve: Secondary | ICD-10-CM | POA: Diagnosis not present

## 2021-05-27 DIAGNOSIS — I4891 Unspecified atrial fibrillation: Secondary | ICD-10-CM | POA: Diagnosis not present

## 2021-05-27 DIAGNOSIS — Z952 Presence of prosthetic heart valve: Secondary | ICD-10-CM | POA: Diagnosis not present

## 2021-05-27 DIAGNOSIS — I5022 Chronic systolic (congestive) heart failure: Secondary | ICD-10-CM | POA: Diagnosis not present

## 2021-05-27 DIAGNOSIS — Z7901 Long term (current) use of anticoagulants: Secondary | ICD-10-CM | POA: Diagnosis not present

## 2021-05-28 DIAGNOSIS — I480 Paroxysmal atrial fibrillation: Secondary | ICD-10-CM | POA: Diagnosis not present

## 2021-06-19 ENCOUNTER — Other Ambulatory Visit: Payer: Self-pay | Admitting: General Surgery

## 2021-06-19 DIAGNOSIS — C50012 Malignant neoplasm of nipple and areola, left female breast: Secondary | ICD-10-CM

## 2021-06-26 DIAGNOSIS — I480 Paroxysmal atrial fibrillation: Secondary | ICD-10-CM | POA: Diagnosis not present

## 2021-07-04 NOTE — Progress Notes (Signed)
Name: [name]   MRN: [id number]    DOB: 08/24/1941   Date:07/05/2021       Progress Note  Subjective  Chief Complaint  Follow up   HPI  Chronic combined CHF: she is on Demadex, potassium and magnesium daily, leg cramps still present but not as severe. Off digoxin but is taking metoprolol for rate control.  No chest , but has SOB  with moderate activity , she denies orthopnea, denies lower extremity edema. She is not on ACE because of cough, she was on Losartan for a period of time but bp dropped so cardiologist discontinued medication, however he just resumed medication on her last visit but she stopped on her own due to increase in chest pain, advised her to try taking half pill of losartan 25 mg and if she needs to stop taking it again to notify Dr. Clayborn Bigness   Echo from 09/21  MILD SEGMENTAL LV SYSTOLIC DYSFUNCTION WITH AN ESTIMATED EF = 40-45 %  NORMAL RIGHT VENTRICULAR SYSTOLIC FUNCTION  MODERATE TRICUSPID AND MITRAL VALVE INSUFFICIENCY  TRACE AORTIC VALVE INSUFFICIENCY  NO VALVULAR STENOSIS  MILD RV ENLARGEMENT  MILD BIATRIAL ENLARGEMENT  MODERATE LVH  S/P MITRAL VALVE REPAIR  S/P AORTIC VALVE REPLACEMENT  AORTIC VALVE BIOPROSTHESIS APPEARS TO BE WELL-SEATED    HTN: taking metoprolol, bp is well controlled  No chest pain she has mild SOB with moderate activity. She  is compliant with medication she has been  feeling well lately.   Hyperlipidemia: she stopped taking Atorvastatin but is taking Zetia, we sent rx of Repatha but she states states she does not want injectables at this time  Explained LDL goal is below 70    Insomnia: she takes Trazodone prn usually just half a pill , most nights she sleeps without medication. Stable    Gout: no recent episodes, on Allopurinol , last time she had a flare had steroid injection  Elbow pain: seen by Ortho, diagnosed with bursitis, doing well at this time   Paget's disease of left breast: she noticed a growth on left nipple Fall  2021 , she came in after a couple of months, was referred Dr. Bary Castilla had a biopsy followed by excisional biopsy and had 4 weeks of radiation with Dr. Donella Stade , she finished last week, doing well. She has a follow up with Dr. Fleet Contras and Mammogram scheduled    Dysmetabolic Syndrome:  She denies polyphagia, polyuria or polydipsia Last hgbA1C was 5.7%, she is on a low carb diet, weight is stable    Afib: rate controlled, no fluttering sensation, taking betablocker and off digoxin, also on coumadin and goes to coumadin clinic/Dr. Callwood. Denies bleeding or bruising , no blood in stools Stable    Chronic bronchitis :  She states she no longer coughing in the mornings, she only has SOB only intermittently with moderate activity , she quit smoking in 2010. She uses Spiriva daily , denies recent wheezing, except during COVID back in June 2022 , she states feeling great    Depression: she has a long history of depression, used to take SSRI's, symptoms now are intermittent and has been in remission for months, only symptoms is difficulty sleeping and takes trazodone prn only, occasionally only  . She denies suicidal thoughts or ideation. She is motivated, working part time at [employer] and loves it   Hyperparathyroidism: she saw nephrologist and was referred to Endo, possible primary versus secondary hyperparathyroidism, but at this time Dr. Honor Junes recommends  just to monitor .      Patient Active Problem List   Diagnosis Date Noted   Cellulitis 04/18/2021   Paget's disease of right breast ([place]) 07/18/2020   Tibialis posterior tendinitis 03/08/2020   On Coumadin for atrial fibrillation ([place]) 06/30/2018   Chronic anticoagulation 08/27/2017   History of right breast cancer 08/27/2017   Chronic obstructive pulmonary disease ([place]) 07/04/2015   Controlled gout 06/06/2015   Anxiety 02/09/2015   Synovial cyst of popliteal space 02/09/2015   Benign hypertension 02/09/2015   Blood type A+  02/09/2015   Arterial branch occlusion of retina 02/09/2015   Chronic constipation 02/09/2015   Insomnia, persistent 02/09/2015   Chronic combined systolic and diastolic CHF, NYHA class 1 ([place]) 02/09/2015   Dyslipidemia 02/09/2015   Atrial fibrillation ([place]) 02/09/2015   Arthritis urica 02/09/2015   Hearing loss 02/09/2015   History of open heart surgery 02/09/2015   Chronic hoarseness 02/09/2015   Cardiomegaly 08/27/7587   Dysmetabolic syndrome 32/54/9826   Migraine with aura and without status migrainosus, not intractable 02/09/2015   Hypertensive pulmonary vascular disease ([place]) 02/09/2015   Allergic rhinitis, seasonal 02/09/2015   Vitamin D deficiency 02/09/2015   History of mitral valve repair 10/01/2012   History of aortic valve replacement 10/01/2012   Congestive heart failure ([place]) 10/01/2012    Past Surgical History:  Procedure Laterality Date   ABDOMINAL HYSTERECTOMY  1996   arm surgery  2019   BREAST BIOPSY Right 1998   neg   BREAST BIOPSY Right 2007   neg   BREAST BIOPSY Right 1992   positive   BREAST EXCISIONAL BIOPSY Left 2000   neg   BREAST LUMPECTOMY Right 1992   BREAST LUMPECTOMY Left 08/17/2020   Procedure: BREAST LUMPECTOMY;  Surgeon: Robert Bellow, MD;  Location: [facility] ORS;  Service: General;  Laterality: Left;   CARDIAC VALVE REPLACEMENT  2014   CATARACT EXTRACTION W/PHACO Left 03/30/2018   Procedure: CATARACT EXTRACTION PHACO AND INTRAOCULAR LENS PLACEMENT ([place]);  Surgeon: Birder Robson, MD;  Location: [facility] ORS;  Service: Ophthalmology;  Laterality: Left;  [place] 00:31.9 AP% 12.1 CDE 3.87 Fluid Pack lot # [id number]   CESAREAN SECTION     FRACTURE SURGERY Left    ARM/ LEG   LEG SURGERY      Family History  Problem Relation Age of Onset   Cancer Mother    Diabetes Mother    Hypertension Mother    Breast cancer Mother 64   Hypertension Father    Heart failure Father    Cancer Brother        bladder   Cancer Sister        breast    Breast cancer Sister 9   Hypertension Son    Hypertension Daughter    Breast cancer Sister 27    Social History   Tobacco Use   Smoking status: Former    Types: Cigarettes    Quit date: 09/09/2007    Years since quitting: 13.8   Smokeless tobacco: Never  Substance Use Topics   Alcohol use: No    Alcohol/week: 0.0 standard drinks     Current Outpatient Medications:    acetaminophen (TYLENOL) 500 MG tablet, Take 1,000 mg by mouth every 6 (six) hours as needed for mild pain or moderate pain. , Disp: , Rfl:    allopurinol (ZYLOPRIM) 300 MG tablet, Take 1 tablet (300 mg total) by mouth daily., Disp: 90 tablet, Rfl: 1   celecoxib (CELEBREX) 200 MG capsule, Take 1  capsule (200 mg total) by mouth daily., Disp: 7 capsule, Rfl: 0   Ergocalciferol (VITAMIN D2) 50 MCG (2000 UT) TABS, Take 2,000 Units by mouth daily., Disp: , Rfl:    ezetimibe (ZETIA) 10 MG tablet, TAKE 1 TABLET BY MOUTH EVERY DAY (Patient taking differently: Take 10 mg by mouth daily.), Disp: 90 tablet, Rfl: 3   losartan (COZAAR) 25 MG tablet, Take 25 mg by mouth daily., Disp: , Rfl:    metoprolol tartrate (LOPRESSOR) 25 MG tablet, Take 1 tablet (25 mg total) by mouth 2 (two) times daily., Disp: 180 tablet, Rfl: 1   Omega 3 1000 MG CAPS, Take 1,000 mg by mouth daily., Disp: , Rfl:    potassium chloride SA (KLOR-CON M20) 20 MEQ tablet, Take 1 tablet (20 mEq total) by mouth 2 (two) times daily., Disp: 180 tablet, Rfl: 0   SPIRIVA HANDIHALER 18 MCG inhalation capsule, INHALE 1 CASPULE VIA HANDIHALER ONCE A DAY AT THE SAME TIME EVERY DAY, Disp: 90 capsule, Rfl: 1   torsemide (DEMADEX) 20 MG tablet, Take 1 tablet (20 mg total) by mouth daily., Disp: 90 tablet, Rfl: 1   traZODone (DESYREL) 50 MG tablet, Take 1 tablet (50 mg total) by mouth at bedtime as needed., Disp: 90 tablet, Rfl: 1   warfarin (COUMADIN) 5 MG tablet, Take 5-7.5 mg by mouth See admin instructions. Take 5 mg on Mon., Wed., Sunday. All the other days take 7.5 mg, Disp:  , Rfl:   Allergies  Allergen Reactions   Codeine Nausea And Vomiting and Nausea Only   Ace Inhibitors Cough and Other (See Comments)   Amoxicillin-Pot Clavulanate Nausea And Vomiting   Penicillins Nausea And Vomiting and Other (See Comments)    Has patient had a PCN reaction causing immediate rash, facial/tongue/throat swelling, SOB or lightheadedness with hypotension: No Has patient had a PCN reaction causing severe rash involving mucus membranes or skin necrosis: No Has patient had a PCN reaction that required hospitalization No Has patient had a PCN reaction occurring within the last 10 years: No If all of the above answers are "NO", then may proceed with Cephalosporin use.   Rosuvastatin Other (See Comments)    Reaction:  Joint pain    Sulfa Antibiotics Other (See Comments)   Tetanus-Diphtheria Toxoids Td Swelling    I personally reviewed active problem list, medication list, allergies, family history, social history, health maintenance with the patient/caregiver today.   ROS  Constitutional: Negative for fever or weight change.  Respiratory: Negative for cough and shortness of breath.   Cardiovascular: Negative for chest pain or palpitations.  Gastrointestinal: Negative for abdominal pain, no bowel changes.  Musculoskeletal: Negative for gait problem or joint swelling.  Skin: Negative for rash.  Neurological: Negative for dizziness or headache.  No other specific complaints in a complete review of systems (except as listed in HPI above).   Objective  Vitals:   07/05/21 1521  BP: 132/72  Pulse: 64  Resp: 16  Temp: 98.2 F (36.8 C)  TempSrc: Oral  SpO2: 99%  Weight: 178 lb 9.6 oz (81 kg)  Height: 5\' 3"  (1.6 m)    Body mass index is 31.64 kg/m.  Physical Exam  Constitutional: Patient appears well-developed and well-nourished. Obese  No distress.  HEENT: head atraumatic, normocephalic, pupils equal and reactive to light, neck supple, throat within normal  limits Cardiovascular: Normal rate, irregular rhythm and normal heart sounds.  1plus  murmur heard. No BLE edema. Pulmonary/Chest: Effort normal and breath sounds normal. No  respiratory distress. Abdominal: Soft.  There is no tenderness. Psychiatric: Patient has a normal mood and affect. behavior is normal. Judgment and thought content normal.   PHQ2/9: Depression screen [facility] 2/9 07/05/2021 03/04/2021 01/22/2021 10/31/2020 06/28/2020  Decreased Interest 0 0 0 0 0  Down, Depressed, Hopeless 0 0 0 0 0  PHQ - 2 Score 0 0 0 0 0  Altered sleeping - 0 - - -  Tired, decreased energy - 0 - - -  Change in appetite - 0 - - -  Feeling bad or failure about yourself  - 0 - - -  Trouble concentrating - 0 - - -  Moving slowly or fidgety/restless - 0 - - -  Suicidal thoughts - 0 - - -  PHQ-9 Score - 0 - - -  Difficult doing work/chores - Not difficult at all - - -  Some recent data might be hidden    phq 9 is negative   Fall Risk: Fall Risk  07/05/2021 03/04/2021 01/22/2021 10/31/2020 06/28/2020  Falls in the past year? 0 0 0 0 0  Number falls in past yr: - 0 0 0 0  Injury with Fall? - 0 0 0 0  Comment - - - - -  Risk for fall due to : - - - - -  Follow up Falls prevention discussed - Falls evaluation completed - -     Functional Status Survey: Is the patient deaf or have difficulty hearing?: No Does the patient have difficulty seeing, even when wearing glasses/contacts?: No Does the patient have difficulty concentrating, remembering, or making decisions?: No Does the patient have difficulty walking or climbing stairs?: No Does the patient have difficulty dressing or bathing?: No Does the patient have difficulty doing errands alone such as visiting a doctor's office or shopping?: No    Assessment & Plan  1. Simple chronic bronchitis (HCC)   2. Paroxysmal atrial fibrillation (HCC)  - metoprolol tartrate (LOPRESSOR) 25 MG tablet; Take 1 tablet (25 mg total) by mouth 2 (two) times daily.   Dispense: 180 tablet; Refill: 1  3. Hyperparathyroidism ([place])  - Parathyroid hormone, intact (no Ca)  4. Chronic combined systolic and diastolic CHF, NYHA class 1 (HCC)  - torsemide (DEMADEX) 20 MG tablet; Take 1 tablet (20 mg total) by mouth daily.  Dispense: 90 tablet; Refill: 1 - potassium chloride SA (KLOR-CON M20) 20 MEQ tablet; Take 1 tablet (20 mEq total) by mouth 2 (two) times daily.  Dispense: 180 tablet; Refill: 1  5. Dyslipidemia  - ezetimibe (ZETIA) 10 MG tablet; Take 1 tablet (10 mg total) by mouth daily.  Dispense: 90 tablet; Refill: 3 - Lipid panel  6. Controlled gout  - allopurinol (ZYLOPRIM) 300 MG tablet; Take 1 tablet (300 mg total) by mouth daily.  Dispense: 90 tablet; Refill: 1  7. Benign hypertension  - torsemide (DEMADEX) 20 MG tablet; Take 1 tablet (20 mg total) by mouth daily.  Dispense: 90 tablet; Refill: 1 - COMPLETE METABOLIC PANEL WITH GFR  8. Need for immunization against influenza  - Flu vaccine HIGH DOSE PF (Fluzone High dose)  9. Insomnia, unspecified type  - traZODone (DESYREL) 50 MG tablet; Take 1 tablet (50 mg total) by mouth at bedtime as needed.  Dispense: 90 tablet; Refill: 1  10. Vitamin D deficiency

## 2021-07-05 ENCOUNTER — Encounter: Payer: Self-pay | Admitting: Family Medicine

## 2021-07-05 ENCOUNTER — Other Ambulatory Visit: Payer: Self-pay

## 2021-07-05 ENCOUNTER — Ambulatory Visit (INDEPENDENT_AMBULATORY_CARE_PROVIDER_SITE_OTHER): Payer: Medicare Other | Admitting: Family Medicine

## 2021-07-05 VITALS — BP 132/72 | HR 64 | Temp 98.2°F | Resp 16 | Ht 63.0 in | Wt 178.6 lb

## 2021-07-05 DIAGNOSIS — J410 Simple chronic bronchitis: Secondary | ICD-10-CM

## 2021-07-05 DIAGNOSIS — E559 Vitamin D deficiency, unspecified: Secondary | ICD-10-CM

## 2021-07-05 DIAGNOSIS — E785 Hyperlipidemia, unspecified: Secondary | ICD-10-CM

## 2021-07-05 DIAGNOSIS — Z23 Encounter for immunization: Secondary | ICD-10-CM

## 2021-07-05 DIAGNOSIS — E213 Hyperparathyroidism, unspecified: Secondary | ICD-10-CM | POA: Diagnosis not present

## 2021-07-05 DIAGNOSIS — G4700 Insomnia, unspecified: Secondary | ICD-10-CM

## 2021-07-05 DIAGNOSIS — I480 Paroxysmal atrial fibrillation: Secondary | ICD-10-CM | POA: Diagnosis not present

## 2021-07-05 DIAGNOSIS — I5042 Chronic combined systolic (congestive) and diastolic (congestive) heart failure: Secondary | ICD-10-CM

## 2021-07-05 DIAGNOSIS — I10 Essential (primary) hypertension: Secondary | ICD-10-CM

## 2021-07-05 DIAGNOSIS — M109 Gout, unspecified: Secondary | ICD-10-CM | POA: Diagnosis not present

## 2021-07-05 MED ORDER — ALLOPURINOL 300 MG PO TABS: 300.0000 mg | ORAL_TABLET | Freq: Every day | ORAL | 1 refills | Status: DC

## 2021-07-05 MED ORDER — POTASSIUM CHLORIDE CRYS ER 20 MEQ PO TBCR: 20.0000 meq | EXTENDED_RELEASE_TABLET | Freq: Two times a day (BID) | ORAL | 1 refills | Status: DC

## 2021-07-05 MED ORDER — EZETIMIBE 10 MG PO TABS: 10.0000 mg | ORAL_TABLET | Freq: Every day | ORAL | 3 refills | Status: DC

## 2021-07-05 MED ORDER — METOPROLOL TARTRATE 25 MG PO TABS: 25.0000 mg | ORAL_TABLET | Freq: Two times a day (BID) | ORAL | 1 refills | Status: DC

## 2021-07-05 MED ORDER — TRAZODONE HCL 50 MG PO TABS: 50.0000 mg | ORAL_TABLET | Freq: Every evening | ORAL | 1 refills | Status: DC | PRN

## 2021-07-05 MED ORDER — TORSEMIDE 20 MG PO TABS: 20.0000 mg | ORAL_TABLET | Freq: Every day | ORAL | 1 refills | Status: DC

## 2021-07-08 DIAGNOSIS — E785 Hyperlipidemia, unspecified: Secondary | ICD-10-CM | POA: Diagnosis not present

## 2021-07-08 DIAGNOSIS — I10 Essential (primary) hypertension: Secondary | ICD-10-CM | POA: Diagnosis not present

## 2021-07-08 DIAGNOSIS — E213 Hyperparathyroidism, unspecified: Secondary | ICD-10-CM | POA: Diagnosis not present

## 2021-07-09 ENCOUNTER — Ambulatory Visit: Payer: Self-pay

## 2021-07-09 LAB — COMPLETE METABOLIC PANEL WITH GFR
AG Ratio: 1.7 (calc) (ref 1.0–2.5)
ALT: 28 U/L (ref 6–29)
AST: 34 U/L (ref 10–35)
Albumin: 4.2 g/dL (ref 3.6–5.1)
Alkaline phosphatase (APISO): 89 U/L (ref 37–153)
BUN: 18 mg/dL (ref 7–25)
CO2: 26 mmol/L (ref 20–32)
Calcium: 9.5 mg/dL (ref 8.6–10.4)
Chloride: 108 mmol/L (ref 98–110)
Creat: 0.85 mg/dL (ref 0.60–1.00)
Globulin: 2.5 g/dL (calc) (ref 1.9–3.7)
Glucose, Bld: 99 mg/dL (ref 65–99)
Potassium: 3.7 mmol/L (ref 3.5–5.3)
Sodium: 143 mmol/L (ref 135–146)
Total Bilirubin: 0.6 mg/dL (ref 0.2–1.2)
Total Protein: 6.7 g/dL (ref 6.1–8.1)
eGFR: 70 mL/min/{1.73_m2} (ref >=60)

## 2021-07-09 LAB — LIPID PANEL
Cholesterol: 182 mg/dL (ref <200)
HDL: 46 mg/dL — ABNORMAL LOW (ref >=50)
LDL Cholesterol (Calc): 115 mg/dL (calc) — ABNORMAL HIGH
Non-HDL Cholesterol (Calc): 136 mg/dL (calc) — ABNORMAL HIGH (ref <130)
Total CHOL/HDL Ratio: 4 (calc) (ref <5.0)
Triglycerides: 100 mg/dL (ref <150)

## 2021-07-09 LAB — PARATHYROID HORMONE, INTACT (NO CA): PTH: 106 pg/mL — ABNORMAL HIGH (ref 16–77)

## 2021-07-09 NOTE — Telephone Encounter (Signed)
Please review

## 2021-07-09 NOTE — Telephone Encounter (Signed)
Pt called and asked about her lab results that was done 07/08/21.  Routing to [employer]. Please ask provider to review. Thank you! Reason for Disposition  [1] Caller requesting NON-URGENT health information AND [2] PCP's office is the best resource  Answer Assessment - Initial Assessment Questions 1. REASON FOR CALL or QUESTION: "What is your reason for calling today?" or "How can I best help you?" or "What question do you have that I can help answer?"     Results  Protocols used: Information Only Call - No Triage-A-AH

## 2021-07-10 ENCOUNTER — Other Ambulatory Visit: Payer: Self-pay | Admitting: Family Medicine

## 2021-07-10 MED ORDER — PRALUENT 150 MG/ML ~~LOC~~ SOAJ: 300.0000 mg | SUBCUTANEOUS | 5 refills | Status: DC

## 2021-07-10 NOTE — Telephone Encounter (Signed)
Pt notified of lab results, she states can not give her self injections.  She would rather do praluent monthly injection, but would need to come her to get someone to stick? Please advise

## 2021-07-11 NOTE — Telephone Encounter (Signed)
Left detailed vm °

## 2021-07-12 ENCOUNTER — Telehealth (INDEPENDENT_AMBULATORY_CARE_PROVIDER_SITE_OTHER): Payer: Medicare Other | Admitting: Internal Medicine

## 2021-07-12 ENCOUNTER — Encounter: Payer: Self-pay | Admitting: Internal Medicine

## 2021-07-12 ENCOUNTER — Telehealth: Payer: Self-pay

## 2021-07-12 VITALS — Ht 64.0 in | Wt 178.0 lb

## 2021-07-12 DIAGNOSIS — J069 Acute upper respiratory infection, unspecified: Secondary | ICD-10-CM

## 2021-07-12 DIAGNOSIS — R062 Wheezing: Secondary | ICD-10-CM

## 2021-07-12 NOTE — Telephone Encounter (Signed)
Pt has an appt with Dr Rosana Berger    Copied from [place] [id number]. Topic: General - Other >> Jul 12, 2021 10:29 AM Yvette Rack wrote: Reason for CRM: Pt stated she recently had an appt with Dr. Ancil Boozer and now she has a cough and scratchy throat. Attempted to schedule appt but pt declined and asked if a message could be sent to Dr. Ancil Boozer if an antibiotic could be called in for her. Pt requests call back to advise. Cb# [phone]

## 2021-07-12 NOTE — Progress Notes (Signed)
Virtual Visit via Video Note  I connected with [name] on 07/12/21 at 11:00 AM EDT by a video enabled telemedicine application and verified that I am speaking with the correct person using two identifiers.  Location: Patient: home Provider: [facility]   I discussed the limitations of evaluation and management by telemedicine and the availability of in person appointments. The patient expressed understanding and agreed to proceed.  History of Present Illness:  [name] is a 80 year old female presenting via telemedicine for scratchy throat and cough x 1 day.  Chronic medical conditions include hypertension, CHF, A. fib, migraine, COPD. Symptoms started yesterday. She did do two home COVID test that were both negative this morning. Choked on crumbs of cornbread yesterday, which caused a scratchy/itchy throat.  Seems to be getting worse initially, causing some mild coughing but today feels much better.  For her COPD she does use Spiriva about 1-2 times a week.  She did use it once yesterday.  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    Observations/Objective:  General: well nourished, well developed, in no acute distress with non-toxic appearance HEENT: normocephalic, atraumatic, moist mucous membranes Neuro: Alert and oriented, speech normal  Assessment and  Plan:  1. Viral upper respiratory tract infection: The patient did ask for Z-pac, which has helped her in the past, however due to her symptoms only ongoing for 1 day this would be inappropriate.  She can continue nasal saline and nasal steroid, as well as Tessalon as a cough suppressant and over-the-counter antihistamine.  It was discussed that she should use the decongestant with caution due to her history of high blood pressure, which she understands.  Can continue to use Spiriva for cough/chest tightness.  Otherwise continue treating conservatively. The patient was offered PCR COVID testing at our facility. She was given a work note. Follow-up as needed if symptoms worsen or fail to improve.   Follow Up Instructions:   I discussed the assessment and treatment plan with the patient. The patient was provided an opportunity to ask questions and all were answered. The patient agreed with the plan and demonstrated an understanding of the instructions.   The patient was advised to call back or seek an in-person evaluation if the symptoms worsen or if the condition fails to improve as anticipated.  I provided 12 minutes of non-face-to-face time during this encounter.   Teodora Medici, DO

## 2021-07-12 NOTE — Patient Instructions (Signed)
It was great seeing you today!  Plan discussed at today's visit: -Continue nasal spray, OCT anti-histamine for symptoms as well as Spiriva -Use decongestant cautiously as it could make high blood pressure worse -Call in 1 week if symptoms worsen or fail to improve  Take care and let us know if you have any questions or concerns prior to your next visit.  Dr. Rosana Berger

## 2021-07-15 ENCOUNTER — Telehealth: Payer: Self-pay | Admitting: Family Medicine

## 2021-07-15 MED ORDER — METHYLPREDNISOLONE 4 MG PO TBPK: ORAL_TABLET | ORAL | 0 refills | Status: DC

## 2021-07-15 NOTE — Telephone Encounter (Signed)
Patient called in says a Dr's note was written for her to e out tomorrow, but she needs a note to be out unitl Wednesday. Please call back

## 2021-07-15 NOTE — Telephone Encounter (Signed)
Pt.notified

## 2021-07-15 NOTE — Telephone Encounter (Signed)
Is this ok to change? 

## 2021-07-15 NOTE — Telephone Encounter (Signed)
Pt called in stating she is still having a bad cough and itchy throat, she stated she was told by Benjamine Mola that if its still going on to call back to try to do something different, pt also stated she needs a work note, please advise.

## 2021-07-19 ENCOUNTER — Encounter: Payer: Self-pay | Admitting: Family Medicine

## 2021-07-24 DIAGNOSIS — I480 Paroxysmal atrial fibrillation: Secondary | ICD-10-CM | POA: Diagnosis not present

## 2021-08-07 ENCOUNTER — Ambulatory Visit
Admission: RE | Admit: 2021-08-07 | Discharge: 2021-08-07 | Disposition: A | Discharge status: Home / Self Care | Payer: Medicare Other | Source: Ambulatory Visit | Attending: General Surgery | Admitting: General Surgery

## 2021-08-07 ENCOUNTER — Other Ambulatory Visit: Payer: Self-pay

## 2021-08-07 DIAGNOSIS — C50012 Malignant neoplasm of nipple and areola, left female breast: Secondary | ICD-10-CM

## 2021-08-07 DIAGNOSIS — R922 Inconclusive mammogram: Secondary | ICD-10-CM | POA: Diagnosis not present

## 2021-08-07 DIAGNOSIS — M109 Gout, unspecified: Secondary | ICD-10-CM | POA: Diagnosis not present

## 2021-08-07 DIAGNOSIS — M79675 Pain in left toe(s): Secondary | ICD-10-CM | POA: Diagnosis not present

## 2021-08-27 DIAGNOSIS — C50012 Malignant neoplasm of nipple and areola, left female breast: Secondary | ICD-10-CM | POA: Diagnosis not present

## 2021-09-04 ENCOUNTER — Ambulatory Visit: Payer: Self-pay

## 2021-09-04 ENCOUNTER — Telehealth: Payer: Medicare Other | Admitting: Physician Assistant

## 2021-09-04 NOTE — Telephone Encounter (Signed)
°  Chief Complaint: sinus congestion Symptoms: cough, congestion, drainage, sore throat Frequency: 3-4 days Pertinent Negatives: Patient denies fever or headache Disposition: [] ED /[] Urgent Care (no appt availability in office) / [] Appointment(In office/virtual)/ [x]  [facility]/ [] Home Care/ [] Refused Recommended Disposition  Additional Notes: No availability in office or virtual so pt was scheduled for visit virtual UC at 1315 today for congestion.    Summary: sinus discomfort / rx request   The patient is currently experiencing sinus discomfort and drainage since 09/02/21   The patient shares that the drainage is causing them to have a sore throat and cough   The patient would like to be prescribed something for their symptoms   Please contact further when possible     Reason for Disposition  [1] Using nasal washes and pain medicine > 24 hours AND [2] sinus pain (lower forehead, cheekbone, or eye) persists  Answer Assessment - Initial Assessment Questions 1. ONSET: "When did the nasal discharge start?"      08/31/21 2. AMOUNT: "How much discharge is there?"      moderate 3. COUGH: "Do you have a cough?" If yes, ask: "Describe the color of your sputum" (clear, white, yellow, green)     yes 4. RESPIRATORY DISTRESS: "Describe your breathing."      NO 5. FEVER: "Do you have a fever?" If Yes, ask: "What is your temperature, how was it measured, and when did it start?"     NO 6. SEVERITY: "Overall, how bad are you feeling right now?" (e.g., doesn't interfere with normal activities, staying home from school/work, staying in bed)      mild 7. OTHER SYMPTOMS: "Do you have any other symptoms?" (e.g., sore throat, earache, wheezing, vomiting)     Sore throat, cough, sinus drainage  Protocols used: Common Cold-A-AH

## 2021-09-05 NOTE — Progress Notes (Signed)
No show

## 2021-09-06 ENCOUNTER — Ambulatory Visit: Payer: Medicare Other | Admitting: Family Medicine

## 2021-10-11 ENCOUNTER — Other Ambulatory Visit: Payer: Self-pay | Admitting: Family Medicine

## 2021-10-11 DIAGNOSIS — E785 Hyperlipidemia, unspecified: Secondary | ICD-10-CM

## 2021-10-11 NOTE — Telephone Encounter (Signed)
Pt has 2 RF left, 07/05/21 #90/3  Requested Prescriptions  Pending Prescriptions Disp Refills   ezetimibe (ZETIA) 10 MG tablet 90 tablet 3    Sig: Take 1 tablet (10 mg total) by mouth daily.     There is no refill protocol information for this order    Refused Prescriptions Disp Refills   ezetimibe (ZETIA) 10 MG tablet [Pharmacy Med Name: EZETIMIBE 10 MG TABLET] 30 tablet 11    Sig: TAKE 1 TABLET BY MOUTH EVERY DAY     Cardiovascular:  Antilipid - Sterol Transport Inhibitors Failed - 10/11/2021  5:40 PM      Failed - Lipid Panel in normal range within the last 12 months    Cholesterol, Total  Date Value Ref Range Status  01/17/2016 286 (H) 100 - 199 mg/dL Final   Cholesterol  Date Value Ref Range Status  07/08/2021 182 <200 mg/dL Final   LDL Cholesterol (Calc)  Date Value Ref Range Status  07/08/2021 115 (H) mg/dL (calc) Final    Comment:    Reference range: <100 . Desirable range <100 mg/dL for primary prevention;   <70 mg/dL for patients with CHD or diabetic patients  with > or = 2 CHD risk factors. Marland Kitchen LDL-C is now calculated using the Martin-Hopkins  calculation, which is a validated novel method providing  better accuracy than the Friedewald equation in the  estimation of LDL-C.  Cresenciano Genre et al. Annamaria Helling. 7829;562(13): 2061-2068  (http://education.[employer].com/faq/FAQ164)    HDL  Date Value Ref Range Status  07/08/2021 46 (L) > OR = 50 mg/dL Final  01/17/2016 41 >39 mg/dL Final   Triglycerides  Date Value Ref Range Status  07/08/2021 100 <150 mg/dL Final         Passed - AST in normal range and within 360 days    AST  Date Value Ref Range Status  07/08/2021 34 10 - 35 U/L Final   SGOT(AST)  Date Value Ref Range Status  08/17/2012 31 15 - 37 Unit/L Final          Passed - ALT in normal range and within 360 days    ALT  Date Value Ref Range Status  07/08/2021 28 6 - 29 U/L Final   SGPT (ALT)  Date Value Ref Range Status  08/17/2012 42 12 - 78  U/L Final          Passed - Patient is not pregnant      Passed - Valid encounter within last 12 months    Recent Outpatient Visits           3 months ago Viral upper respiratory tract infection   [place], DO   3 months ago Simple chronic bronchitis [facility])   [facility] [name], MD   7 months ago Olecranon bursitis of left elbow   [facility] [name], MD   8 months ago COVID-19   [place], NP   11 months ago Paroxysmal atrial fibrillation [facility])   [facility] [name], MD       Future Appointments             In 3 weeks [name], MD [facility], [facility]

## 2021-10-11 NOTE — Addendum Note (Signed)
Addended by: Erie Noe on: 10/11/2021 06:41 PM   Modules accepted: Orders

## 2021-10-14 ENCOUNTER — Other Ambulatory Visit: Payer: Self-pay

## 2021-10-14 MED ORDER — EZETIMIBE 10 MG PO TABS: 10.0000 mg | ORAL_TABLET | Freq: Every day | ORAL | 3 refills | Status: DC

## 2021-10-15 ENCOUNTER — Other Ambulatory Visit: Payer: Self-pay

## 2021-10-15 ENCOUNTER — Encounter: Payer: Self-pay | Admitting: Internal Medicine

## 2021-10-15 ENCOUNTER — Ambulatory Visit (INDEPENDENT_AMBULATORY_CARE_PROVIDER_SITE_OTHER): Payer: Medicare PPO | Admitting: Internal Medicine

## 2021-10-15 VITALS — BP 132/78 | HR 82 | Temp 98.2°F | Resp 16 | Ht 63.0 in | Wt 171.7 lb

## 2021-10-15 DIAGNOSIS — J069 Acute upper respiratory infection, unspecified: Secondary | ICD-10-CM | POA: Diagnosis not present

## 2021-10-15 DIAGNOSIS — J410 Simple chronic bronchitis: Secondary | ICD-10-CM

## 2021-10-15 MED ORDER — METHYLPREDNISOLONE 4 MG PO TBPK
ORAL_TABLET | ORAL | 0 refills | Status: DC
Start: 2021-10-15 — End: 2021-11-05

## 2021-10-15 NOTE — Patient Instructions (Signed)
It was great seeing you today!  Plan discussed at today's visit: -Continue the Doxycycline until finished -Continue Spirva daily and Albuterol inhaler as needed for wheezing -Medrol dosepack sent to pharmacy to help with wheezing/cough  Follow up in: as needed  Take care and let us know if you have any questions or concerns prior to your next visit.  Dr. Rosana Berger

## 2021-10-15 NOTE — Progress Notes (Signed)
Acute Office Visit  Subjective:    Patient ID: [name], female    DOB: June 08, 1941, 81 y.o.   MRN: [id number]  Chief Complaint  Patient presents with   URI    Went to urgent care Saturday, dx w/ bronchitis given doxy, cough meds and albuterol inhaler    HPI Patient is in today for cough. She was seen on 10/12/21 at Urgent Care, was given Doxycycline and Albuterol inhaler. Was out in the rain on Wednesday, symptoms started the next day. Been on the antibiotics for 3 days, its causing some mild nausea.  URI Compliant:  -Worst symptom: -Fever: no -Cough: yes productive, yellow -Shortness of breath: no -Wheezing: yes -Chest pain: no -Chest tightness: yes -Chest congestion: no -Nasal congestion: no -Runny nose: yes, clear drainage  -Post nasal drip: no -Sneezing: no -Sore throat: no -Sinus pressure: no -Headache: no -Ear pain: no    -Ear pressure: no  -Context: better -Relief with OTC cold/cough medications: no  -Treatments attempted: none, tessalon perles, Doxy and Albuterol    Past Medical History:  Diagnosis Date   Aortic stenosis    s/p AVR in 10/2012   Atrial fibrillation [facility])    h/o cardioversion   Breast cancer ([place]) 1992   positive, radiation   Cardiomyopathy ([place])    CHF (congestive heart failure) (HCC)    Chronic kidney disease    Stage III   COPD (chronic obstructive pulmonary disease) (HCC)    Gout    Hyperlipidemia    Hypertension    Murmur    Pulmonary HTN (HCC)    Valvular heart disease    s/p AV replacement and MV repair in 10/2012    Past Surgical History:  Procedure Laterality Date   ABDOMINAL HYSTERECTOMY  1996   arm surgery  2019   BREAST BIOPSY Right 1998   neg   BREAST BIOPSY Right 2007   neg   BREAST BIOPSY Right 1992   positive   BREAST EXCISIONAL BIOPSY Left 2000   neg   BREAST LUMPECTOMY Right 1992   BREAST LUMPECTOMY Left 08/17/2020   Procedure: BREAST LUMPECTOMY;  Surgeon: Robert Bellow, MD;  Location: [facility]  ORS;  Service: General;  Laterality: Left;   CARDIAC VALVE REPLACEMENT  2014   CATARACT EXTRACTION W/PHACO Left 03/30/2018   Procedure: CATARACT EXTRACTION PHACO AND INTRAOCULAR LENS PLACEMENT ([place]);  Surgeon: Birder Robson, MD;  Location: [facility] ORS;  Service: Ophthalmology;  Laterality: Left;  [place] 00:31.9 AP% 12.1 CDE 3.87 Fluid Pack lot # [id number]   CESAREAN SECTION     FRACTURE SURGERY Left    ARM/ LEG   LEG SURGERY      Family History  Problem Relation Age of Onset   Cancer Mother    Diabetes Mother    Hypertension Mother    Breast cancer Mother 57   Hypertension Father    Heart failure Father    Cancer Brother        bladder   Cancer Sister        breast   Breast cancer Sister 52   Hypertension Son    Hypertension Daughter    Breast cancer Sister 27    Social History   Socioeconomic History   Marital status: Widowed    Spouse name: Not on file   Number of children: 2   Years of education: Not on file   Highest education level: 12th grade  Occupational History   Occupation: cafeteria     Comment:  [employer]  Tobacco Use   Smoking status: Former    Types: Cigarettes    Quit date: 09/09/2007    Years since quitting: 14.1   Smokeless tobacco: Never  Vaping Use   Vaping Use: Never used  Substance and Sexual Activity   Alcohol use: No    Alcohol/week: 0.0 standard drinks   Drug use: No   Sexual activity: Not Currently  Other Topics Concern   Not on file  Social History Narrative   Not on file   Social Determinants of Health   Financial Resource Strain: Not on file  Food Insecurity: Not on file  Transportation Needs: Not on file  Physical Activity: Not on file  Stress: Not on file  Social Connections: Not on file  Intimate Partner Violence: Not on file    Outpatient Medications Prior to Visit  Medication Sig Dispense Refill   acetaminophen (TYLENOL) 500 MG tablet Take 1,000 mg by mouth every 6 (six) hours as needed for mild pain or moderate  pain.      Alirocumab (PRALUENT) 150 MG/ML SOAJ Inject 300 mg into the skin every 30 (thirty) days. 2 mL 5   allopurinol (ZYLOPRIM) 300 MG tablet Take 1 tablet (300 mg total) by mouth daily. 90 tablet 1   Ergocalciferol (VITAMIN D2) 50 MCG (2000 UT) TABS Take 2,000 Units by mouth daily.     ezetimibe (ZETIA) 10 MG tablet Take 1 tablet (10 mg total) by mouth daily. 90 tablet 3   losartan (COZAAR) 25 MG tablet Take 25 mg by mouth daily.     methylPREDNISolone (MEDROL DOSEPAK) 4 MG TBPK tablet Day 1: Take 8 mg (2 tablets) before breakfast, 4 mg (1 tablet) after lunch, 4 mg (1 tablet) after supper, and 8 mg (2 tablets) at bedtime. Day 2:Take 4 mg (1 tablet) before breakfast, 4 mg (1 tablet) after lunch, 4 mg (1 tablet) after supper, and 8 mg (2 tablets) at bedtime. Day 3: Take 4 mg (1 tablet) before breakfast, 4 mg (1 tablet) after lunch, 4 mg (1 tablet) after supper, and 4 mg (1 tablet) at bedtime. Day 4: Take 4 mg (1 tablet) before breakfast, 4 mg (1 tablet) after lunch, and 4 mg (1 tablet) at bedtime. Day 5: Take 4 mg (1 tablet) before breakfast and 4 mg (1 tablet) at bedtime. Day 6: Take 4 mg (1 tablet) before breakfast. 1 each 0   metoprolol tartrate (LOPRESSOR) 25 MG tablet Take 1 tablet (25 mg total) by mouth 2 (two) times daily. 180 tablet 1   Omega 3 1000 MG CAPS Take 1,000 mg by mouth daily.     potassium chloride SA (KLOR-CON M20) 20 MEQ tablet Take 1 tablet (20 mEq total) by mouth 2 (two) times daily. 180 tablet 1   SPIRIVA HANDIHALER 18 MCG inhalation capsule INHALE 1 CASPULE VIA HANDIHALER ONCE A DAY AT THE SAME TIME EVERY DAY 90 capsule 1   torsemide (DEMADEX) 20 MG tablet Take 1 tablet (20 mg total) by mouth daily. 90 tablet 1   traZODone (DESYREL) 50 MG tablet Take 1 tablet (50 mg total) by mouth at bedtime as needed. 90 tablet 1   warfarin (COUMADIN) 5 MG tablet Take 5-7.5 mg by mouth See admin instructions. Take 5 mg on Mon., Wed., Sunday. All the other days take 7.5 mg     No  facility-administered medications prior to visit.    Allergies  Allergen Reactions   Codeine Nausea And Vomiting and Nausea Only   Ace  Inhibitors Cough and Other (See Comments)   Amoxicillin-Pot Clavulanate Nausea And Vomiting   Penicillins Nausea And Vomiting and Other (See Comments)    Has patient had a PCN reaction causing immediate rash, facial/tongue/throat swelling, SOB or lightheadedness with hypotension: No Has patient had a PCN reaction causing severe rash involving mucus membranes or skin necrosis: No Has patient had a PCN reaction that required hospitalization No Has patient had a PCN reaction occurring within the last 10 years: No If all of the above answers are "NO", then may proceed with Cephalosporin use.   Rosuvastatin Other (See Comments)    Reaction:  Joint pain    Sulfa Antibiotics Other (See Comments)   Tetanus-Diphtheria Toxoids Td Swelling    Review of Systems  Constitutional:  Negative for fever.  HENT:  Positive for rhinorrhea. Negative for congestion, sinus pressure, sinus pain and sore throat.   Respiratory:  Positive for cough and wheezing. Negative for shortness of breath.   Cardiovascular:  Negative for chest pain.  Gastrointestinal:  Negative for abdominal pain, nausea and vomiting.  Neurological:  Negative for dizziness and headaches.      Objective:    Physical Exam Constitutional:      Appearance: Normal appearance.  HENT:     Head: Normocephalic and atraumatic.  Eyes:     Conjunctiva/sclera: Conjunctivae normal.  Cardiovascular:     Rate and Rhythm: Normal rate and regular rhythm.  Pulmonary:     Effort: Pulmonary effort is normal.     Comments: Expiratory wheezing at the bilateral bases Musculoskeletal:     Right lower leg: No edema.     Left lower leg: No edema.  Skin:    General: Skin is warm and dry.  Neurological:     General: No focal deficit present.     Mental Status: She is alert. Mental status is at baseline.  Psychiatric:         Mood and Affect: Mood normal.        Behavior: Behavior normal.    BP 132/78    Pulse 82    Temp 98.2 F (36.8 C)    Resp 16    Ht 5' 3"  (1.6 m)    Wt 171 lb 11.2 oz (77.9 kg)    SpO2 97%    BMI 30.42 kg/m  Wt Readings from Last 3 Encounters:  07/12/21 178 lb (80.7 kg)  07/05/21 178 lb 9.6 oz (81 kg)  05/08/21 (P) 182 lb 14.4 oz (83 kg)    Health Maintenance Due  Topic Date Due   Zoster Vaccines- Shingrix (1 of 2) Never done   COVID-19 Vaccine (4 - Booster for Moderna series) 08/31/2020    There are no preventive care reminders to display for this patient.   Lab Results  Component Value Date   TSH 1.55 08/17/2012   Lab Results  Component Value Date   WBC 5.2 10/11/2020   HGB 12.5 10/11/2020   HCT 37.8 10/11/2020   MCV 89.4 10/11/2020   PLT 223 10/11/2020   Lab Results  Component Value Date   NA 143 07/08/2021   K 3.7 07/08/2021   CO2 26 07/08/2021   GLUCOSE 99 07/08/2021   BUN 18 07/08/2021   CREATININE 0.85 07/08/2021   BILITOT 0.6 07/08/2021   ALKPHOS 82 07/31/2017   AST 34 07/08/2021   ALT 28 07/08/2021   PROT 6.7 07/08/2021   ALBUMIN 4.2 07/31/2017   CALCIUM 9.5 07/08/2021   ANIONGAP 8 08/01/2017   EGFR  70 07/08/2021   Lab Results  Component Value Date   CHOL 182 07/08/2021   Lab Results  Component Value Date   HDL 46 (L) 07/08/2021   Lab Results  Component Value Date   LDLCALC 115 (H) 07/08/2021   Lab Results  Component Value Date   TRIG 100 07/08/2021   Lab Results  Component Value Date   CHOLHDL 4.0 07/08/2021   Lab Results  Component Value Date   HGBA1C 5.7 (H) 06/28/2020       Assessment & Plan:   1. Viral upper respiratory tract infection/Simple chronic bronchitis ([place]): She will continue full course of Doxycycline, we discussed how to properly use the Albuterol inhaler. As she is still wheezing, Medrol dosepack sent to her pharmacy. Work note given. Follow up as needed.   - methylPREDNISolone (MEDROL DOSEPAK) 4 MG  TBPK tablet; Day 1: Take 8 mg (2 tablets) before breakfast, 4 mg (1 tablet) after lunch, 4 mg (1 tablet) after supper, and 8 mg (2 tablets) at bedtime. Day 2:Take 4 mg (1 tablet) before breakfast, 4 mg (1 tablet) after lunch, 4 mg (1 tablet) after supper, and 8 mg (2 tablets) at bedtime. Day 3: Take 4 mg (1 tablet) before breakfast, 4 mg (1 tablet) after lunch, 4 mg (1 tablet) after supper, and 4 mg (1 tablet) at bedtime. Day 4: Take 4 mg (1 tablet) before breakfast, 4 mg (1 tablet) after lunch, and 4 mg (1 tablet) at bedtime. Day 5: Take 4 mg (1 tablet) before breakfast and 4 mg (1 tablet) at bedtime. Day 6: Take 4 mg (1 tablet) before breakfast.  Dispense: 1 each; Refill: 0  Teodora Medici, DO

## 2021-10-16 ENCOUNTER — Ambulatory Visit: Payer: Medicare Other | Admitting: Family Medicine

## 2021-11-04 NOTE — Progress Notes (Signed)
Name: [name]   MRN: [id number]    DOB: 1941-06-15   Date:11/05/2021       Progress Note  Subjective  Chief Complaint  Follow Up  HPI  Chronic combined CHF: she is on Demadex, potassium and magnesium daily, leg cramps has improved. . Off digoxin but is taking metoprolol for rate control.  No chest , but has SOB  with moderate activity , she denies orthopnea, denies lower extremity edema. She is not on ACE because of cough, she was on Losartan for a period of time but bp dropped so cardiologist discontinued medication, however he just resumed medication on her last visit but she stopped on her own due to increase in chest pain, she tried half a pill and still felt tired, she will try taking half of Losartan at night to see if works better.   Echo from 09/21  MILD SEGMENTAL LV SYSTOLIC DYSFUNCTION WITH AN ESTIMATED EF = 40-45 %  NORMAL RIGHT VENTRICULAR SYSTOLIC FUNCTION  MODERATE TRICUSPID AND MITRAL VALVE INSUFFICIENCY  TRACE AORTIC VALVE INSUFFICIENCY  NO VALVULAR STENOSIS  MILD RV ENLARGEMENT  MILD BIATRIAL ENLARGEMENT  MODERATE LVH  S/P MITRAL VALVE REPAIR  S/P AORTIC VALVE REPLACEMENT  AORTIC VALVE BIOPROSTHESIS APPEARS TO BE WELL-SEATED    HTN: taking metoprolol, but not Losartan at this time , bp is well controlled  No chest pain she has mild SOB with moderate activity. She  is compliant with medication and no side effects    Hyperlipidemia: she stopped taking Atorvastatin but is taking Zetia, we sent rx of PCSK 9 but she states states she does not want injectables at this time  Last LDL not at goal, but improved, it used to be over 200 and it was down to 115.    Insomnia: she takes Trazodone prn usually just half a pill , most nights she sleeps without medication.    Gout: no recent episodes, on Allopurinol , last time she had a flare had steroid injection  History of Paget's disease of left breast: she noticed a growth on left nipple Fall 2021 , she came in after a  couple of months, was referred Dr. Bary Castilla had a biopsy followed by excisional biopsy and had 4 weeks of radiation with Dr. Donella Stade , completed treatment.  She is also still up to date with Dr. Fleet Contras    Dysmetabolic Syndrome:  She denies polyphagia, polyuria or polydipsia Last hgbA1C was 5.7%, she is on a low carb diet, weight is stable She does not want to have labs done today    Afib/history of aorta valve replacement in 2014 : rate controlled, no fluttering sensation, taking betablocker and off digoxin, also on coumadin and goes to coumadin clinic/Dr. Callwood. Denies bleeding or bruising no blood in stools INR has been at goal at 3.1 done yesterday    Chronic bronchitis :  She states she no longer coughing in the mornings, she only has SOB only intermittently with moderate activity , she quit smoking in 2010. She uses Spiriva daily , denies recent wheezing, she had a flare 09/2021    Depression: she has a long history of depression, used to take SSRI's, symptoms now are intermittent and has been in remission for months, sleep has improved  . She denies suicidal thoughts or ideation. She is motivated, working part time at [employer] and loves being active and busy   Hyperparathyroidism: she saw nephrologist and was referred to Endo, possible primary versus secondary hyperparathyroidism, she  needs to re-schedule follow up with him   Patient Active Problem List   Diagnosis Date Noted   Paget's disease of right breast ([place]) 07/18/2020   Tibialis posterior tendinitis 03/08/2020   On Coumadin for atrial fibrillation ([place]) 06/30/2018   Chronic anticoagulation 08/27/2017   History of right breast cancer 08/27/2017   Chronic obstructive pulmonary disease ([place]) 07/04/2015   Controlled gout 06/06/2015   Anxiety 02/09/2015   Synovial cyst of popliteal space 02/09/2015   Benign hypertension 02/09/2015   Blood type A+ 02/09/2015   Arterial branch occlusion of retina 02/09/2015   Chronic  constipation 02/09/2015   Insomnia, persistent 02/09/2015   Chronic combined systolic and diastolic CHF, NYHA class 1 ([place]) 02/09/2015   Dyslipidemia 02/09/2015   Atrial fibrillation ([place]) 02/09/2015   Arthritis urica 02/09/2015   Hearing loss 02/09/2015   History of open heart surgery 02/09/2015   Chronic hoarseness 02/09/2015   Cardiomegaly 45/80/9983   Dysmetabolic syndrome 38/25/0539   Migraine with aura and without status migrainosus, not intractable 02/09/2015   Hypertensive pulmonary vascular disease ([place]) 02/09/2015   Allergic rhinitis, seasonal 02/09/2015   Vitamin D deficiency 02/09/2015   History of mitral valve repair 10/01/2012   History of aortic valve replacement 10/01/2012   Congestive heart failure ([place]) 10/01/2012    Past Surgical History:  Procedure Laterality Date   ABDOMINAL HYSTERECTOMY  1996   arm surgery  2019   BREAST BIOPSY Right 1998   neg   BREAST BIOPSY Right 2007   neg   BREAST BIOPSY Right 1992   positive   BREAST EXCISIONAL BIOPSY Left 2000   neg   BREAST LUMPECTOMY Right 1992   BREAST LUMPECTOMY Left 08/17/2020   Procedure: BREAST LUMPECTOMY;  Surgeon: Robert Bellow, MD;  Location: [facility] ORS;  Service: General;  Laterality: Left;   CARDIAC VALVE REPLACEMENT  2014   CATARACT EXTRACTION W/PHACO Left 03/30/2018   Procedure: CATARACT EXTRACTION PHACO AND INTRAOCULAR LENS PLACEMENT ([place]);  Surgeon: Birder Robson, MD;  Location: [facility] ORS;  Service: Ophthalmology;  Laterality: Left;  [place] 00:31.9 AP% 12.1 CDE 3.87 Fluid Pack lot # [id number]   CESAREAN SECTION     FRACTURE SURGERY Left    ARM/ LEG   LEG SURGERY      Family History  Problem Relation Age of Onset   Cancer Mother    Diabetes Mother    Hypertension Mother    Breast cancer Mother 56   Hypertension Father    Heart failure Father    Cancer Brother        bladder   Cancer Sister        breast   Breast cancer Sister 65   Hypertension Son    Hypertension Daughter     Breast cancer Sister 100    Social History   Tobacco Use   Smoking status: Former    Types: Cigarettes    Quit date: 09/09/2007    Years since quitting: 14.1   Smokeless tobacco: Never  Substance Use Topics   Alcohol use: No    Alcohol/week: 0.0 standard drinks     Current Outpatient Medications:    acetaminophen (TYLENOL) 500 MG tablet, Take 1,000 mg by mouth every 6 (six) hours as needed for mild pain or moderate pain. , Disp: , Rfl:    Alirocumab (PRALUENT) 150 MG/ML SOAJ, Inject 300 mg into the skin every 30 (thirty) days., Disp: 2 mL, Rfl: 5   allopurinol (ZYLOPRIM) 300 MG tablet, Take 1 tablet (300 mg  total) by mouth daily., Disp: 90 tablet, Rfl: 1   Ergocalciferol (VITAMIN D2) 50 MCG (2000 UT) TABS, Take 2,000 Units by mouth daily., Disp: , Rfl:    ezetimibe (ZETIA) 10 MG tablet, Take 1 tablet (10 mg total) by mouth daily., Disp: 90 tablet, Rfl: 3   losartan (COZAAR) 25 MG tablet, Take 25 mg by mouth daily., Disp: , Rfl:    methylPREDNISolone (MEDROL DOSEPAK) 4 MG TBPK tablet, Day 1: Take 8 mg (2 tablets) before breakfast, 4 mg (1 tablet) after lunch, 4 mg (1 tablet) after supper, and 8 mg (2 tablets) at bedtime. Day 2:Take 4 mg (1 tablet) before breakfast, 4 mg (1 tablet) after lunch, 4 mg (1 tablet) after supper, and 8 mg (2 tablets) at bedtime. Day 3: Take 4 mg (1 tablet) before breakfast, 4 mg (1 tablet) after lunch, 4 mg (1 tablet) after supper, and 4 mg (1 tablet) at bedtime. Day 4: Take 4 mg (1 tablet) before breakfast, 4 mg (1 tablet) after lunch, and 4 mg (1 tablet) at bedtime. Day 5: Take 4 mg (1 tablet) before breakfast and 4 mg (1 tablet) at bedtime. Day 6: Take 4 mg (1 tablet) before breakfast., Disp: 1 each, Rfl: 0   metoprolol tartrate (LOPRESSOR) 25 MG tablet, Take 1 tablet (25 mg total) by mouth 2 (two) times daily., Disp: 180 tablet, Rfl: 1   potassium chloride SA (KLOR-CON M20) 20 MEQ tablet, Take 1 tablet (20 mEq total) by mouth 2 (two) times daily., Disp: 180  tablet, Rfl: 1   SPIRIVA HANDIHALER 18 MCG inhalation capsule, INHALE 1 CASPULE VIA HANDIHALER ONCE A DAY AT THE SAME TIME EVERY DAY, Disp: 90 capsule, Rfl: 1   torsemide (DEMADEX) 20 MG tablet, Take 1 tablet (20 mg total) by mouth daily., Disp: 90 tablet, Rfl: 1   traZODone (DESYREL) 50 MG tablet, Take 1 tablet (50 mg total) by mouth at bedtime as needed., Disp: 90 tablet, Rfl: 1   warfarin (COUMADIN) 5 MG tablet, Take 5-7.5 mg by mouth See admin instructions. Take 5 mg on Mon., Wed., Sunday. All the other days take 7.5 mg, Disp: , Rfl:    Omega 3 1000 MG CAPS, Take 1,000 mg by mouth daily. (Patient not taking: Reported on 11/05/2021), Disp: , Rfl:   Allergies  Allergen Reactions   Codeine Nausea And Vomiting and Nausea Only   Ace Inhibitors Cough and Other (See Comments)   Amoxicillin-Pot Clavulanate Nausea And Vomiting   Penicillins Nausea And Vomiting and Other (See Comments)    Has patient had a PCN reaction causing immediate rash, facial/tongue/throat swelling, SOB or lightheadedness with hypotension: No Has patient had a PCN reaction causing severe rash involving mucus membranes or skin necrosis: No Has patient had a PCN reaction that required hospitalization No Has patient had a PCN reaction occurring within the last 10 years: No If all of the above answers are "NO", then may proceed with Cephalosporin use.   Rosuvastatin Other (See Comments)    Reaction:  Joint pain    Sulfa Antibiotics Other (See Comments)   Tetanus-Diphtheria Toxoids Td Swelling    I personally reviewed active problem list, medication list, allergies, family history, social history, health maintenance with the patient/caregiver today.   ROS  Constitutional: Negative for fever or weight change.  Respiratory: Negative for cough and shortness of breath.   Cardiovascular: Negative for chest pain or palpitations.  Gastrointestinal: Negative for abdominal pain, no bowel changes.  Musculoskeletal: Negative for  gait problem or  joint swelling.  Skin: Negative for rash.  Neurological: Negative for dizziness or headache.  No other specific complaints in a complete review of systems (except as listed in HPI above).   Objective  Vitals:   11/05/21 1423  BP: 132/76  Pulse: 84  Resp: 16  Temp: 97.7 F (36.5 C)  TempSrc: Oral  SpO2: 98%  Weight: 178 lb 4.8 oz (80.9 kg)  Height: 5\' 3"  (1.6 m)    Body mass index is 31.58 kg/m.  Physical Exam  Constitutional: Patient appears well-developed and well-nourished. Obese  No distress.  HEENT: head atraumatic, normocephalic, pupils equal and reactive to light, , neck supple Cardiovascular: Normal rate, regular rhythm and normal heart sounds. 2/6  murmur heard. No BLE edema. Pulmonary/Chest: Effort normal and breath sounds normal. No respiratory distress. Abdominal: Soft.  There is no tenderness. Psychiatric: Patient has a normal mood and affect. behavior is normal. Judgment and thought content normal.   PHQ2/9: Depression screen [facility] 2/9 11/05/2021 10/15/2021 07/12/2021 07/05/2021 03/04/2021  Decreased Interest 0 0 0 0 0  Down, Depressed, Hopeless 0 0 0 0 0  PHQ - 2 Score 0 0 0 0 0  Altered sleeping 0 0 0 - 0  Tired, decreased energy 0 0 0 - 0  Change in appetite 0 0 0 - 0  Feeling bad or failure about yourself  0 0 0 - 0  Trouble concentrating 0 0 0 - 0  Moving slowly or fidgety/restless 0 0 0 - 0  Suicidal thoughts 0 0 0 - 0  PHQ-9 Score 0 0 0 - 0  Difficult doing work/chores Not difficult at all Not difficult at all Not difficult at all - Not difficult at all  Some recent data might be hidden    phq 9 is negative   Fall Risk: Fall Risk  11/05/2021 10/15/2021 07/12/2021 07/05/2021 03/04/2021  Falls in the past year? 0 0 0 0 0  Number falls in past yr: 0 0 0 - 0  Injury with Fall? 0 0 0 - 0  Comment - - - - -  Risk for fall due to : No Fall Risks - - - -  Follow up Falls prevention discussed - - Falls prevention discussed -       Functional Status Survey: Is the patient deaf or have difficulty hearing?: No Does the patient have difficulty seeing, even when wearing glasses/contacts?: No Does the patient have difficulty concentrating, remembering, or making decisions?: No Does the patient have difficulty walking or climbing stairs?: No Does the patient have difficulty dressing or bathing?: No Does the patient have difficulty doing errands alone such as visiting a doctor's office or shopping?: No    Assessment & Plan  1. Chronic combined systolic and diastolic CHF, NYHA class 1 (HCC)  Under the care of Dr. Clayborn Bigness   2. Hyperparathyroidism ([place])  Needs to follow up with Dr. Honor Junes  3. Paroxysmal atrial fibrillation (HCC)  Rate controlled  4. Major depression in remission [facility])  We will continue to monitor  5. Pulmonary hypertension ([place])   6. Dyslipidemia   7. Need for shingles vaccine  - Zoster Vaccine Adjuvanted [facility]) injection; Inject 0.5 mLs into the muscle once for 1 dose.  Dispense: 0.5 mL; Refill: 1  8. Controlled gout   9. Benign hypertension  At goal   10. Vitamin D deficiency  Continue supplementation  11. Statin myopathy   12. History of Paget's disease of breast   13. Low magnesium level  She does not want to have labs done today   14. History of aortic valve replacement

## 2021-11-05 ENCOUNTER — Encounter: Payer: Self-pay | Admitting: Family Medicine

## 2021-11-05 ENCOUNTER — Ambulatory Visit (INDEPENDENT_AMBULATORY_CARE_PROVIDER_SITE_OTHER): Payer: Medicare PPO | Admitting: Family Medicine

## 2021-11-05 VITALS — BP 132/76 | HR 84 | Temp 97.7°F | Resp 16 | Ht 63.0 in | Wt 178.3 lb

## 2021-11-05 DIAGNOSIS — Z23 Encounter for immunization: Secondary | ICD-10-CM

## 2021-11-05 DIAGNOSIS — I272 Pulmonary hypertension, unspecified: Secondary | ICD-10-CM

## 2021-11-05 DIAGNOSIS — I5042 Chronic combined systolic (congestive) and diastolic (congestive) heart failure: Secondary | ICD-10-CM | POA: Diagnosis not present

## 2021-11-05 DIAGNOSIS — Z952 Presence of prosthetic heart valve: Secondary | ICD-10-CM

## 2021-11-05 DIAGNOSIS — I10 Essential (primary) hypertension: Secondary | ICD-10-CM

## 2021-11-05 DIAGNOSIS — E213 Hyperparathyroidism, unspecified: Secondary | ICD-10-CM

## 2021-11-05 DIAGNOSIS — G720 Drug-induced myopathy: Secondary | ICD-10-CM

## 2021-11-05 DIAGNOSIS — E785 Hyperlipidemia, unspecified: Secondary | ICD-10-CM

## 2021-11-05 DIAGNOSIS — M109 Gout, unspecified: Secondary | ICD-10-CM

## 2021-11-05 DIAGNOSIS — F325 Major depressive disorder, single episode, in full remission: Secondary | ICD-10-CM

## 2021-11-05 DIAGNOSIS — I480 Paroxysmal atrial fibrillation: Secondary | ICD-10-CM

## 2021-11-05 DIAGNOSIS — T466X5A Adverse effect of antihyperlipidemic and antiarteriosclerotic drugs, initial encounter: Secondary | ICD-10-CM

## 2021-11-05 DIAGNOSIS — Z853 Personal history of malignant neoplasm of breast: Secondary | ICD-10-CM

## 2021-11-05 DIAGNOSIS — R790 Abnormal level of blood mineral: Secondary | ICD-10-CM

## 2021-11-05 DIAGNOSIS — E559 Vitamin D deficiency, unspecified: Secondary | ICD-10-CM

## 2021-11-05 MED ORDER — SHINGRIX 50 MCG/0.5ML IM SUSR: 0.5000 mL | Freq: Once | INTRAMUSCULAR | 1 refills | Status: AC

## 2021-11-05 NOTE — Patient Instructions (Signed)
Shingles vaccine sent to [facility]

## 2021-11-06 ENCOUNTER — Ambulatory Visit: Payer: Medicare PPO | Admitting: Radiation Oncology

## 2021-11-18 DIAGNOSIS — Z7901 Long term (current) use of anticoagulants: Secondary | ICD-10-CM | POA: Diagnosis not present

## 2021-11-18 DIAGNOSIS — Z79899 Other long term (current) drug therapy: Secondary | ICD-10-CM | POA: Diagnosis not present

## 2021-12-19 DIAGNOSIS — I480 Paroxysmal atrial fibrillation: Secondary | ICD-10-CM | POA: Diagnosis not present

## 2022-01-08 ENCOUNTER — Other Ambulatory Visit: Payer: Self-pay | Admitting: Family Medicine

## 2022-01-08 DIAGNOSIS — I5042 Chronic combined systolic (congestive) and diastolic (congestive) heart failure: Secondary | ICD-10-CM

## 2022-01-09 DIAGNOSIS — I4891 Unspecified atrial fibrillation: Secondary | ICD-10-CM | POA: Diagnosis not present

## 2022-01-09 DIAGNOSIS — I5022 Chronic systolic (congestive) heart failure: Secondary | ICD-10-CM | POA: Diagnosis not present

## 2022-01-09 DIAGNOSIS — Z7901 Long term (current) use of anticoagulants: Secondary | ICD-10-CM | POA: Diagnosis not present

## 2022-01-09 DIAGNOSIS — Z79899 Other long term (current) drug therapy: Secondary | ICD-10-CM | POA: Diagnosis not present

## 2022-01-13 DIAGNOSIS — M109 Gout, unspecified: Secondary | ICD-10-CM | POA: Diagnosis not present

## 2022-01-16 DIAGNOSIS — M109 Gout, unspecified: Secondary | ICD-10-CM | POA: Diagnosis not present

## 2022-01-17 DIAGNOSIS — M109 Gout, unspecified: Secondary | ICD-10-CM | POA: Diagnosis not present

## 2022-02-10 DIAGNOSIS — M109 Gout, unspecified: Secondary | ICD-10-CM | POA: Diagnosis not present

## 2022-02-11 DIAGNOSIS — Z79899 Other long term (current) drug therapy: Secondary | ICD-10-CM | POA: Diagnosis not present

## 2022-02-11 DIAGNOSIS — I5022 Chronic systolic (congestive) heart failure: Secondary | ICD-10-CM | POA: Diagnosis not present

## 2022-02-11 DIAGNOSIS — I4891 Unspecified atrial fibrillation: Secondary | ICD-10-CM | POA: Diagnosis not present

## 2022-02-11 DIAGNOSIS — Z7901 Long term (current) use of anticoagulants: Secondary | ICD-10-CM | POA: Diagnosis not present

## 2022-02-17 ENCOUNTER — Other Ambulatory Visit: Payer: Self-pay | Admitting: Family Medicine

## 2022-02-17 DIAGNOSIS — I5042 Chronic combined systolic (congestive) and diastolic (congestive) heart failure: Secondary | ICD-10-CM

## 2022-02-17 DIAGNOSIS — I10 Essential (primary) hypertension: Secondary | ICD-10-CM

## 2022-02-19 DIAGNOSIS — H34231 Retinal artery branch occlusion, right eye: Secondary | ICD-10-CM | POA: Diagnosis not present

## 2022-03-04 NOTE — Progress Notes (Signed)
Name: [name]   MRN: [id number]    DOB: 1940-11-23   Date:03/05/2022       Progress Note  Subjective  Chief Complaint  Follow Up  HPI  Chronic combined CHF/pulmonary hypertension : she is on Demadex, potassium and magnesium daily, leg cramps has improved. . Off digoxin but is taking metoprolol for rate control.  No chest , but has SOB  with moderate activity , she denies orthopnea, denies lower extremity edema. She is not on ACE because of cough, she was on Losartan on  her own but states she will discuss it with Dr. Clayborn Bigness    Echo from 09/21  MILD SEGMENTAL LV SYSTOLIC DYSFUNCTION WITH AN ESTIMATED EF = 40-45 %  NORMAL RIGHT VENTRICULAR SYSTOLIC FUNCTION  MODERATE TRICUSPID AND MITRAL VALVE INSUFFICIENCY  TRACE AORTIC VALVE INSUFFICIENCY  NO VALVULAR STENOSIS  MILD RV ENLARGEMENT  MILD BIATRIAL ENLARGEMENT  MODERATE LVH  S/P MITRAL VALVE REPAIR  S/P AORTIC VALVE REPLACEMENT  AORTIC VALVE BIOPROSTHESIS APPEARS TO BE WELL-SEATED    HTN: taking metoprolol, but not Losartan at this time , bp is well controlled  No chest pain she has mild SOB with moderate activity. She states losartan made her feel tired but will discuss it with Dr. Clayborn Bigness    Hyperlipidemia: she stopped taking Atorvastatin but is taking Zetia, we sent rx of PCSK 9 but she states states she does not want injectables at this time  Last LDL not at goal, but improved, it used to be over 200 and it was down to 115. She asked for Nexlizet , she is unable to tolerate statin and even with Zetia LDL not at goal    Insomnia: she takes Trazodone prn usually just half a pill , most nights she sleeps without medication. Unchanged    Gout: recent episode on right first toe, she saw Dr. Sabra Heck , she was given colchicine, steroids and symptoms finally resolved, she was not taking Allopurinol daily but is back on it since flare   History of Paget's disease of left breast: she noticed a growth on left nipple Fall 2021 , she  came in after a couple of months, was referred Dr. Bary Castilla had a biopsy followed by excisional biopsy and had 4 weeks of radiation with Dr. Donella Stade , completed treatment.  She is also still up to date with Dr. Fleet Contras    Dysmetabolic Syndrome:  She denies polyphagia, or polydipsia Last hgbA1C was 5.7%, she is on a low carb diet, weight is stable  Polyuria is due to diuretics    Afib/history of aorta valve replacement in 2014 : rate controlled, no fluttering sensation, taking betablocker and off digoxin, also on coumadin and goes to coumadin clinic/Dr. Callwood. Denies bleeding or bruising no blood in stools , last INR at goal 3   Chronic bronchitis :  She states she no longer coughing in the mornings but wakes up feeling SOB but has not been using Spiriva prefers using albuterol prn, explained spiriva daily will help with her symptoms. She quit smoking in 2010.   Depression: she has a long history of depression, used to take SSRI's, symptoms now are intermittent and has been in remission for months, sleep has improved  . She denies suicidal thoughts or ideation. She is motivated, working part time at [employer] and loves being active and busy . Unchanged   Hyperparathyroidism: she saw nephrologist and was referred to Endo, possible primary versus secondary hyperparathyroidism, she does not want to  go back at this time so we will recheck labs today . She denies heart burn, bone pain, or GERD. She has intermittent muscle cramps   Patient Active Problem List   Diagnosis Date Noted   Major depression in remission ([place]) 11/05/2021   Hyperparathyroidism ([place]) 11/05/2021   Paget's disease of right breast ([place]) 07/18/2020   Tibialis posterior tendinitis 03/08/2020   On Coumadin for atrial fibrillation ([place]) 06/30/2018   Chronic anticoagulation 08/27/2017   History of right breast cancer 08/27/2017   Chronic obstructive pulmonary disease ([place]) 07/04/2015   Controlled gout 06/06/2015    Anxiety 02/09/2015   Synovial cyst of popliteal space 02/09/2015   Benign hypertension 02/09/2015   Blood type A+ 02/09/2015   Arterial branch occlusion of retina 02/09/2015   Chronic constipation 02/09/2015   Insomnia, persistent 02/09/2015   Chronic combined systolic and diastolic CHF, NYHA class 1 ([place]) 02/09/2015   Dyslipidemia 02/09/2015   Atrial fibrillation ([place]) 02/09/2015   Arthritis urica 02/09/2015   Hearing loss 02/09/2015   History of open heart surgery 02/09/2015   Chronic hoarseness 02/09/2015   Cardiomegaly 17/51/0258   Dysmetabolic syndrome 52/77/8242   Migraine with aura and without status migrainosus, not intractable 02/09/2015   Hypertensive pulmonary vascular disease ([place]) 02/09/2015   Allergic rhinitis, seasonal 02/09/2015   Vitamin D deficiency 02/09/2015   History of mitral valve repair 10/01/2012   History of aortic valve replacement 10/01/2012   Congestive heart failure ([place]) 10/01/2012    Past Surgical History:  Procedure Laterality Date   ABDOMINAL HYSTERECTOMY  1996   arm surgery  2019   BREAST BIOPSY Right 1998   neg   BREAST BIOPSY Right 2007   neg   BREAST BIOPSY Right 1992   positive   BREAST EXCISIONAL BIOPSY Left 2000   neg   BREAST LUMPECTOMY Right 1992   BREAST LUMPECTOMY Left 08/17/2020   Procedure: BREAST LUMPECTOMY;  Surgeon: Robert Bellow, MD;  Location: [facility] ORS;  Service: General;  Laterality: Left;   CARDIAC VALVE REPLACEMENT  2014   CATARACT EXTRACTION W/PHACO Left 03/30/2018   Procedure: CATARACT EXTRACTION PHACO AND INTRAOCULAR LENS PLACEMENT ([place]);  Surgeon: Birder Robson, MD;  Location: [facility] ORS;  Service: Ophthalmology;  Laterality: Left;  [place] 00:31.9 AP% 12.1 CDE 3.87 Fluid Pack lot # [id number]   CESAREAN SECTION     FRACTURE SURGERY Left    ARM/ LEG   LEG SURGERY      Family History  Problem Relation Age of Onset   Cancer Mother    Diabetes Mother    Hypertension Mother    Breast cancer Mother 48    Hypertension Father    Heart failure Father    Cancer Brother        bladder   Cancer Sister        breast   Breast cancer Sister 12   Hypertension Son    Hypertension Daughter    Breast cancer Sister 70    Social History   Tobacco Use   Smoking status: Former    Types: Cigarettes    Quit date: 09/09/2007    Years since quitting: 14.4   Smokeless tobacco: Never  Substance Use Topics   Alcohol use: No    Alcohol/week: 0.0 standard drinks of alcohol     Current Outpatient Medications:    acetaminophen (TYLENOL) 500 MG tablet, Take 1,000 mg by mouth every 6 (six) hours as needed for mild pain or moderate pain. , Disp: , Rfl:  allopurinol (ZYLOPRIM) 300 MG tablet, Take 1 tablet (300 mg total) by mouth daily., Disp: 90 tablet, Rfl: 1   Ergocalciferol (VITAMIN D2) 50 MCG (2000 UT) TABS, Take 2,000 Units by mouth daily., Disp: , Rfl:    ezetimibe (ZETIA) 10 MG tablet, Take 1 tablet (10 mg total) by mouth daily., Disp: 90 tablet, Rfl: 3   KLOR-CON M20 20 MEQ tablet, TAKE 1 TABLET BY MOUTH TWICE A DAY, Disp: 180 tablet, Rfl: 1   metoprolol tartrate (LOPRESSOR) 25 MG tablet, Take 1 tablet (25 mg total) by mouth 2 (two) times daily., Disp: 180 tablet, Rfl: 1   Multiple Vitamins-Minerals (VITAMIN D3 COMPLETE PO), Take 1 tablet by mouth every other day., Disp: , Rfl:    SPIRIVA HANDIHALER 18 MCG inhalation capsule, INHALE 1 CASPULE VIA HANDIHALER ONCE A DAY AT THE SAME TIME EVERY DAY, Disp: 90 capsule, Rfl: 1   torsemide (DEMADEX) 20 MG tablet, TAKE 1 TABLET BY MOUTH EVERY DAY, Disp: 30 tablet, Rfl: 5   traZODone (DESYREL) 50 MG tablet, Take 1 tablet (50 mg total) by mouth at bedtime as needed., Disp: 90 tablet, Rfl: 1   warfarin (COUMADIN) 5 MG tablet, Take 5-7.5 mg by mouth See admin instructions. Take 5 mg on Mon., Wed., Sunday. All the other days take 7.5 mg, Disp: , Rfl:    losartan (COZAAR) 25 MG tablet, Take 25 mg by mouth daily. (Patient not taking: Reported on 03/05/2022), Disp: ,  Rfl:   Allergies  Allergen Reactions   Codeine Nausea And Vomiting and Nausea Only   Ace Inhibitors Cough and Other (See Comments)   Amoxicillin-Pot Clavulanate Nausea And Vomiting   Penicillins Nausea And Vomiting and Other (See Comments)    Has patient had a PCN reaction causing immediate rash, facial/tongue/throat swelling, SOB or lightheadedness with hypotension: No Has patient had a PCN reaction causing severe rash involving mucus membranes or skin necrosis: No Has patient had a PCN reaction that required hospitalization No Has patient had a PCN reaction occurring within the last 10 years: No If all of the above answers are "NO", then may proceed with Cephalosporin use.   Rosuvastatin Other (See Comments)    Reaction:  Joint pain    Sulfa Antibiotics Other (See Comments)   Tetanus-Diphtheria Toxoids Td Swelling    I personally reviewed active problem list, medication list, allergies, family history, social history, health maintenance with the patient/caregiver today.   ROS  Constitutional: Negative for fever or weight change.  Respiratory: Negative for cough but has stable  shortness of breath.   Cardiovascular: Negative for chest pain or palpitations.  Gastrointestinal: Negative for abdominal pain, no bowel changes.  Musculoskeletal: Negative for gait problem or joint swelling.  Skin: Negative for rash.  Neurological: Negative for dizziness or headache.  No other specific complaints in a complete review of systems (except as listed in HPI above).   Objective  Vitals:   03/05/22 1506  BP: 130/72  Pulse: 84  Resp: 16  SpO2: 97%  Weight: 179 lb (81.2 kg)  Height: '5\' 3"'$  (1.6 m)    Body mass index is 31.71 kg/m.  Physical Exam  Constitutional: Patient appears well-developed and well-nourished. Obese  No distress.  HEENT: head atraumatic, normocephalic, pupils equal and reactive to light, neck supple Cardiovascular: Normal rate, positive for some extra beats  1/6  systolic ejection murmur heard. No BLE edema. Pulmonary/Chest: Effort normal and breath sounds normal. No respiratory distress. Abdominal: Soft.  There is no tenderness. Psychiatric: Patient has  a normal mood and affect. behavior is normal. Judgment and thought content normal.    PHQ2/9:    03/05/2022    3:08 PM 11/05/2021    2:16 PM 10/15/2021    9:36 AM 07/12/2021   10:41 AM 07/05/2021    3:23 PM  Depression screen PHQ 2/9  Decreased Interest 0 0 0 0 0  Down, Depressed, Hopeless 0 0 0 0 0  PHQ - 2 Score 0 0 0 0 0  Altered sleeping  0 0 0   Tired, decreased energy  0 0 0   Change in appetite  0 0 0   Feeling bad or failure about yourself   0 0 0   Trouble concentrating  0 0 0   Moving slowly or fidgety/restless  0 0 0   Suicidal thoughts  0 0 0   PHQ-9 Score  0 0 0   Difficult doing work/chores  Not difficult at all Not difficult at all Not difficult at all     phq 9 is negative   Fall Risk:    03/05/2022    3:08 PM 11/05/2021    2:16 PM 10/15/2021    9:36 AM 07/12/2021   10:42 AM 07/05/2021    3:22 PM  Fall Risk   Falls in the past year? 0 0 0 0 0  Number falls in past yr: 0 0 0 0   Injury with Fall? 0 0 0 0   Risk for fall due to : No Fall Risks No Fall Risks     Follow up Falls prevention discussed Falls prevention discussed   Falls prevention discussed      Functional Status Survey: Is the patient deaf or have difficulty hearing?: No Does the patient have difficulty seeing, even when wearing glasses/contacts?: No Does the patient have difficulty concentrating, remembering, or making decisions?: No Does the patient have difficulty walking or climbing stairs?: No Does the patient have difficulty dressing or bathing?: No Does the patient have difficulty doing errands alone such as visiting a doctor's office or shopping?: No    Assessment & Plan  1. Chronic combined systolic and diastolic CHF, NYHA class 1 (HCC)  - torsemide (DEMADEX) 20 MG tablet; Take 1 tablet  (20 mg total) by mouth daily.  Dispense: 90 tablet; Refill: 1  2. Benign hypertension  - torsemide (DEMADEX) 20 MG tablet; Take 1 tablet (20 mg total) by mouth daily.  Dispense: 90 tablet; Refill: 1 - COMPLETE METABOLIC PANEL WITH GFR - CBC with Differential/Platelet  3. Dyslipidemia  - Bempedoic Acid-Ezetimibe (NEXLIZET) 180-10 MG TABS; Take 1 tablet by mouth daily at 12 noon.  Dispense: 90 tablet; Refill: 1 - Lipid panel  4. Controlled gout  - allopurinol (ZYLOPRIM) 300 MG tablet; Take 1 tablet (300 mg total) by mouth daily.  Dispense: 90 tablet; Refill: 1  5. Paroxysmal atrial fibrillation (HCC)  - metoprolol tartrate (LOPRESSOR) 25 MG tablet; Take 1 tablet (25 mg total) by mouth 2 (two) times daily.  Dispense: 180 tablet; Refill: 1  6. Insomnia, unspecified type  - traZODone (DESYREL) 50 MG tablet; Take 1 tablet (50 mg total) by mouth at bedtime as needed.  Dispense: 90 tablet; Refill: 1  7. Simple chronic bronchitis (HCC)  Resume spiriva daily   8. Major depression in remission ([place])   9. Hyperparathyroidism ([place])  - PTH, intact and calcium  10. Vitamin D deficiency  - VITAMIN D 25 Hydroxy (Vit-D Deficiency, Fractures)  11. Statin myopathy  - Lipid panel  12. History of Paget's disease of breast   13. Hyperglycemia  - Hemoglobin A1c  14. Pulmonary hypertension ([place])   15. Family history of thyroid disease  - TSH .

## 2022-03-05 ENCOUNTER — Encounter: Payer: Self-pay | Admitting: Family Medicine

## 2022-03-05 ENCOUNTER — Ambulatory Visit (INDEPENDENT_AMBULATORY_CARE_PROVIDER_SITE_OTHER): Payer: Medicare Other | Admitting: Family Medicine

## 2022-03-05 VITALS — BP 130/72 | HR 84 | Resp 16 | Ht 63.0 in | Wt 179.0 lb

## 2022-03-05 DIAGNOSIS — M109 Gout, unspecified: Secondary | ICD-10-CM

## 2022-03-05 DIAGNOSIS — T466X5A Adverse effect of antihyperlipidemic and antiarteriosclerotic drugs, initial encounter: Secondary | ICD-10-CM | POA: Diagnosis not present

## 2022-03-05 DIAGNOSIS — Z853 Personal history of malignant neoplasm of breast: Secondary | ICD-10-CM | POA: Diagnosis not present

## 2022-03-05 DIAGNOSIS — F325 Major depressive disorder, single episode, in full remission: Secondary | ICD-10-CM

## 2022-03-05 DIAGNOSIS — E785 Hyperlipidemia, unspecified: Secondary | ICD-10-CM

## 2022-03-05 DIAGNOSIS — R739 Hyperglycemia, unspecified: Secondary | ICD-10-CM | POA: Diagnosis not present

## 2022-03-05 DIAGNOSIS — I5042 Chronic combined systolic (congestive) and diastolic (congestive) heart failure: Secondary | ICD-10-CM

## 2022-03-05 DIAGNOSIS — J410 Simple chronic bronchitis: Secondary | ICD-10-CM

## 2022-03-05 DIAGNOSIS — G4700 Insomnia, unspecified: Secondary | ICD-10-CM | POA: Diagnosis not present

## 2022-03-05 DIAGNOSIS — I480 Paroxysmal atrial fibrillation: Secondary | ICD-10-CM | POA: Diagnosis not present

## 2022-03-05 DIAGNOSIS — E213 Hyperparathyroidism, unspecified: Secondary | ICD-10-CM

## 2022-03-05 DIAGNOSIS — Z8349 Family history of other endocrine, nutritional and metabolic diseases: Secondary | ICD-10-CM

## 2022-03-05 DIAGNOSIS — I10 Essential (primary) hypertension: Secondary | ICD-10-CM | POA: Diagnosis not present

## 2022-03-05 DIAGNOSIS — G720 Drug-induced myopathy: Secondary | ICD-10-CM | POA: Diagnosis not present

## 2022-03-05 DIAGNOSIS — E559 Vitamin D deficiency, unspecified: Secondary | ICD-10-CM | POA: Diagnosis not present

## 2022-03-05 DIAGNOSIS — I272 Pulmonary hypertension, unspecified: Secondary | ICD-10-CM

## 2022-03-05 MED ORDER — METOPROLOL TARTRATE 25 MG PO TABS: 25.0000 mg | ORAL_TABLET | Freq: Two times a day (BID) | ORAL | 1 refills | Status: DC

## 2022-03-05 MED ORDER — ALLOPURINOL 300 MG PO TABS: 300.0000 mg | ORAL_TABLET | Freq: Every day | ORAL | 1 refills | Status: DC

## 2022-03-05 MED ORDER — NEXLIZET 180-10 MG PO TABS: 1.0000 | ORAL_TABLET | Freq: Every day | ORAL | 1 refills | Status: DC

## 2022-03-05 MED ORDER — TRAZODONE HCL 50 MG PO TABS: 50.0000 mg | ORAL_TABLET | Freq: Every evening | ORAL | 1 refills | Status: DC | PRN

## 2022-03-05 MED ORDER — TORSEMIDE 20 MG PO TABS: 20.0000 mg | ORAL_TABLET | Freq: Every day | ORAL | 1 refills | Status: DC

## 2022-03-06 ENCOUNTER — Telehealth: Payer: Self-pay | Admitting: Family Medicine

## 2022-03-06 LAB — LIPID PANEL
Cholesterol: 290 mg/dL — ABNORMAL HIGH (ref <200)
HDL: 55 mg/dL (ref >=50)
LDL Cholesterol (Calc): 194 mg/dL (calc) — ABNORMAL HIGH
Non-HDL Cholesterol (Calc): 235 mg/dL (calc) — ABNORMAL HIGH (ref <130)
Total CHOL/HDL Ratio: 5.3 (calc) — ABNORMAL HIGH (ref <5.0)
Triglycerides: 222 mg/dL — ABNORMAL HIGH (ref <150)

## 2022-03-06 LAB — CBC WITH DIFFERENTIAL/PLATELET
Absolute Monocytes: 577 cells/uL (ref 200–950)
Basophils Absolute: 31 cells/uL (ref 0–200)
Basophils Relative: 0.5 %
Eosinophils Absolute: 50 cells/uL (ref 15–500)
Eosinophils Relative: 0.8 %
HCT: 39.4 % (ref 35.0–45.0)
Hemoglobin: 13 g/dL (ref 11.7–15.5)
Lymphs Abs: 2573 cells/uL (ref 850–3900)
MCH: 30 pg (ref 27.0–33.0)
MCHC: 33 g/dL (ref 32.0–36.0)
MCV: 90.8 fL (ref 80.0–100.0)
MPV: 12 fL (ref 7.5–12.5)
Monocytes Relative: 9.3 %
Neutro Abs: 2970 cells/uL (ref 1500–7800)
Neutrophils Relative %: 47.9 %
Platelets: 196 10*3/uL (ref 140–400)
RBC: 4.34 10*6/uL (ref 3.80–5.10)
RDW: 13.5 % (ref 11.0–15.0)
Total Lymphocyte: 41.5 %
WBC: 6.2 10*3/uL (ref 3.8–10.8)

## 2022-03-06 LAB — COMPLETE METABOLIC PANEL WITH GFR
AG Ratio: 1.6 (calc) (ref 1.0–2.5)
ALT: 31 U/L — ABNORMAL HIGH (ref 6–29)
AST: 27 U/L (ref 10–35)
Albumin: 4.1 g/dL (ref 3.6–5.1)
Alkaline phosphatase (APISO): 81 U/L (ref 37–153)
BUN: 20 mg/dL (ref 7–25)
CO2: 31 mmol/L (ref 20–32)
Calcium: 9.7 mg/dL (ref 8.6–10.4)
Chloride: 104 mmol/L (ref 98–110)
Creat: 0.75 mg/dL (ref 0.60–1.00)
Globulin: 2.5 g/dL (calc) (ref 1.9–3.7)
Glucose, Bld: 91 mg/dL (ref 65–99)
Potassium: 3.8 mmol/L (ref 3.5–5.3)
Sodium: 140 mmol/L (ref 135–146)
Total Bilirubin: 0.5 mg/dL (ref 0.2–1.2)
Total Protein: 6.6 g/dL (ref 6.1–8.1)
eGFR: 81 mL/min/{1.73_m2} (ref >=60)

## 2022-03-06 LAB — PTH, INTACT AND CALCIUM
Calcium: 9.7 mg/dL (ref 8.6–10.4)
PTH: 90 pg/mL — ABNORMAL HIGH (ref 16–77)

## 2022-03-06 LAB — HEMOGLOBIN A1C
Hgb A1c MFr Bld: 5.7 % of total Hgb — ABNORMAL HIGH (ref <5.7)
Mean Plasma Glucose: 117 mg/dL
eAG (mmol/L): 6.5 mmol/L

## 2022-03-06 LAB — TSH: TSH: 1.69 mIU/L (ref 0.40–4.50)

## 2022-03-06 LAB — VITAMIN D 25 HYDROXY (VIT D DEFICIENCY, FRACTURES): Vit D, 25-Hydroxy: 28 ng/mL — ABNORMAL LOW (ref 30–100)

## 2022-03-06 NOTE — Telephone Encounter (Signed)
Copied from [place] [id number]. Topic: General - Inquiry >> Mar 06, 2022  2:37 PM [name] wrote: Reason for CRM: Pt has called back in regards to abnormal labs and states that she sees them but she does not understand everything and would like a FU cb to [phone] to discuss, not released to NT to disclose.

## 2022-03-06 NOTE — Telephone Encounter (Signed)
Called pt to inform her provider has not reviewed them yet. Once provider reviews them we will call her.

## 2022-03-07 ENCOUNTER — Encounter: Payer: Self-pay | Admitting: Family Medicine

## 2022-03-12 DIAGNOSIS — Z79899 Other long term (current) drug therapy: Secondary | ICD-10-CM | POA: Diagnosis not present

## 2022-03-12 DIAGNOSIS — I5022 Chronic systolic (congestive) heart failure: Secondary | ICD-10-CM | POA: Diagnosis not present

## 2022-03-12 DIAGNOSIS — Z7901 Long term (current) use of anticoagulants: Secondary | ICD-10-CM | POA: Diagnosis not present

## 2022-03-12 DIAGNOSIS — I4891 Unspecified atrial fibrillation: Secondary | ICD-10-CM | POA: Diagnosis not present

## 2022-04-18 ENCOUNTER — Other Ambulatory Visit: Payer: Self-pay | Admitting: Family Medicine

## 2022-04-18 DIAGNOSIS — J410 Simple chronic bronchitis: Secondary | ICD-10-CM

## 2022-04-21 DIAGNOSIS — I5022 Chronic systolic (congestive) heart failure: Secondary | ICD-10-CM | POA: Diagnosis not present

## 2022-04-21 DIAGNOSIS — I4891 Unspecified atrial fibrillation: Secondary | ICD-10-CM | POA: Diagnosis not present

## 2022-04-21 DIAGNOSIS — Z7901 Long term (current) use of anticoagulants: Secondary | ICD-10-CM | POA: Diagnosis not present

## 2022-04-21 DIAGNOSIS — Z79899 Other long term (current) drug therapy: Secondary | ICD-10-CM | POA: Diagnosis not present

## 2022-05-15 DIAGNOSIS — Z79899 Other long term (current) drug therapy: Secondary | ICD-10-CM | POA: Diagnosis not present

## 2022-05-15 DIAGNOSIS — Z7901 Long term (current) use of anticoagulants: Secondary | ICD-10-CM | POA: Diagnosis not present

## 2022-05-15 DIAGNOSIS — I4891 Unspecified atrial fibrillation: Secondary | ICD-10-CM | POA: Diagnosis not present

## 2022-05-22 DIAGNOSIS — Z7901 Long term (current) use of anticoagulants: Secondary | ICD-10-CM | POA: Diagnosis not present

## 2022-05-22 DIAGNOSIS — I4891 Unspecified atrial fibrillation: Secondary | ICD-10-CM | POA: Diagnosis not present

## 2022-05-22 DIAGNOSIS — Z79899 Other long term (current) drug therapy: Secondary | ICD-10-CM | POA: Diagnosis not present

## 2022-05-22 DIAGNOSIS — I5022 Chronic systolic (congestive) heart failure: Secondary | ICD-10-CM | POA: Diagnosis not present

## 2022-06-23 ENCOUNTER — Other Ambulatory Visit: Payer: Self-pay

## 2022-06-23 ENCOUNTER — Encounter: Payer: Self-pay | Admitting: Nurse Practitioner

## 2022-06-23 ENCOUNTER — Ambulatory Visit (INDEPENDENT_AMBULATORY_CARE_PROVIDER_SITE_OTHER): Payer: Medicare Other | Admitting: Nurse Practitioner

## 2022-06-23 VITALS — BP 134/82 | HR 88 | Temp 97.7°F | Resp 16 | Ht 63.0 in | Wt 172.9 lb

## 2022-06-23 DIAGNOSIS — J069 Acute upper respiratory infection, unspecified: Secondary | ICD-10-CM | POA: Diagnosis not present

## 2022-06-23 DIAGNOSIS — J42 Unspecified chronic bronchitis: Secondary | ICD-10-CM

## 2022-06-23 MED ORDER — PREDNISONE 10 MG (21) PO TBPK: ORAL_TABLET | ORAL | 0 refills | Status: DC

## 2022-06-23 MED ORDER — GUAIFENESIN ER 600 MG PO TB12
600.0000 mg | ORAL_TABLET | Freq: Two times a day (BID) | ORAL | 0 refills | Status: AC
Start: 2022-06-23 — End: 2022-07-03

## 2022-06-23 NOTE — Assessment & Plan Note (Signed)
Continue to use albuterol and Spiriva.  Prescribed prednisone taper.

## 2022-06-23 NOTE — Progress Notes (Signed)
BP 134/82   Pulse 88   Temp 97.7 F (36.5 C) (Oral)   Resp 16   Ht 5' 3" (1.6 m)   Wt 172 lb 14.4 oz (78.4 kg)   SpO2 99%   BMI 30.63 kg/m    Subjective:    Patient ID: [name], female    DOB: 05-26-1941, 81 y.o.   MRN: [id number]  HPI: [name] is a 81 y.o. female  Chief Complaint  Patient presents with   Cough    Congested, drainage, for 3 days negative covid test   Diarrhea   URI/diarrhea: She says symptoms started on Friday with sore throat, cough, nasal congestion and drainage.  She says that she had off and on diarrhea on Friday.  She says she is feeling okay today.  She says she has a productive cough. She says she has had an increase in mucous.  She denies any fever or shortness of breath.  She says she has been doing the saline nasal spray, allergy pill.  Covid test was negative.  Patient reports she has not had any more episodes of diarrhea. She denies any abdominal pain at this time. Discussed starting mucinex and doing a steroid taper.  Patient is in agreement with plan. If any increase in shortness of breath.  She will let us know and we can get a chest xray.   COPD: she says she has not had to use her albuterol inhaler. She says that her breathing has been pretty good.  She says she does hear a wheeze every once in awhile.    Relevant past medical, surgical, family and social history reviewed and updated as indicated. Interim medical history since our last visit reviewed. Allergies and medications reviewed and updated.  Review of Systems  Constitutional: Negative for fever or weight change.  HEENT: positive for nasal congestion Respiratory: positive for cough and negative for shortness of breath.   Cardiovascular: Negative for chest pain or palpitations.  Gastrointestinal: Negative for abdominal pain, no bowel changes.  Musculoskeletal: Negative for gait problem or joint swelling.  Skin: Negative for rash.  Neurological: Negative for dizziness  or headache.  No other specific complaints in a complete review of systems (except as listed in HPI above).      Objective:    BP 134/82   Pulse 88   Temp 97.7 F (36.5 C) (Oral)   Resp 16   Ht 5' 3" (1.6 m)   Wt 172 lb 14.4 oz (78.4 kg)   SpO2 99%   BMI 30.63 kg/m   Wt Readings from Last 3 Encounters:  06/23/22 172 lb 14.4 oz (78.4 kg)  03/05/22 179 lb (81.2 kg)  11/05/21 178 lb 4.8 oz (80.9 kg)    Physical Exam  Constitutional: Patient appears well-developed and well-nourished. Obese  No distress.  HEENT: head atraumatic, normocephalic, pupils equal and reactive to light, ears TMs clear, neck supple, throat within normal limits Cardiovascular: Normal rate, regular rhythm and normal heart sounds.  No murmur heard. No BLE edema. Pulmonary/Chest: Effort normal and breath sounds normal. No respiratory distress. Abdominal: Soft.  There is no tenderness. Psychiatric: Patient has a normal mood and affect. behavior is normal. Judgment and thought content normal.  Results for orders placed or performed in visit on 03/05/22  PTH, intact and calcium  Result Value Ref Range   PTH 90 (H) 16 - 77 pg/mL   Calcium 9.7 8.6 - 10.4 mg/dL  TSH  Result Value Ref Range  TSH 1.69 0.40 - 4.50 mIU/L  COMPLETE METABOLIC PANEL WITH GFR  Result Value Ref Range   Glucose, Bld 91 65 - 99 mg/dL   BUN 20 7 - 25 mg/dL   Creat 0.75 0.60 - 1.00 mg/dL   eGFR 81 > OR = 60 mL/min/1.43m   BUN/Creatinine Ratio NOT APPLICABLE 6 - 22 (calc)   Sodium 140 135 - 146 mmol/L   Potassium 3.8 3.5 - 5.3 mmol/L   Chloride 104 98 - 110 mmol/L   CO2 31 20 - 32 mmol/L   Calcium 9.7 8.6 - 10.4 mg/dL   Total Protein 6.6 6.1 - 8.1 g/dL   Albumin 4.1 3.6 - 5.1 g/dL   Globulin 2.5 1.9 - 3.7 g/dL (calc)   AG Ratio 1.6 1.0 - 2.5 (calc)   Total Bilirubin 0.5 0.2 - 1.2 mg/dL   Alkaline phosphatase (APISO) 81 37 - 153 U/L   AST 27 10 - 35 U/L   ALT 31 (H) 6 - 29 U/L  CBC with Differential/Platelet  Result Value Ref  Range   WBC 6.2 3.8 - 10.8 Thousand/uL   RBC 4.34 3.80 - 5.10 Million/uL   Hemoglobin 13.0 11.7 - 15.5 g/dL   HCT 39.4 35.0 - 45.0 %   MCV 90.8 80.0 - 100.0 fL   MCH 30.0 27.0 - 33.0 pg   MCHC 33.0 32.0 - 36.0 g/dL   RDW 13.5 11.0 - 15.0 %   Platelets 196 140 - 400 Thousand/uL   MPV 12.0 7.5 - 12.5 fL   Neutro Abs 2,970 1,500 - 7,800 cells/uL   Lymphs Abs 2,573 850 - 3,900 cells/uL   Absolute Monocytes 577 200 - 950 cells/uL   Eosinophils Absolute 50 15 - 500 cells/uL   Basophils Absolute 31 0 - 200 cells/uL   Neutrophils Relative % 47.9 %   Total Lymphocyte 41.5 %   Monocytes Relative 9.3 %   Eosinophils Relative 0.8 %   Basophils Relative 0.5 %  Lipid panel  Result Value Ref Range   Cholesterol 290 (H) <200 mg/dL   HDL 55 > OR = 50 mg/dL   Triglycerides 222 (H) <150 mg/dL   LDL Cholesterol (Calc) 194 (H) mg/dL (calc)   Total CHOL/HDL Ratio 5.3 (H) <5.0 (calc)   Non-HDL Cholesterol (Calc) 235 (H) <130 mg/dL (calc)  Hemoglobin A1c  Result Value Ref Range   Hgb A1c MFr Bld 5.7 (H) <5.7 % of total Hgb   Mean Plasma Glucose 117 mg/dL   eAG (mmol/L) 6.5 mmol/L  VITAMIN D 25 Hydroxy (Vit-D Deficiency, Fractures)  Result Value Ref Range   Vit D, 25-Hydroxy 28 (L) 30 - 100 ng/mL      Assessment & Plan:   Problem List Items Addressed This Visit       Respiratory   Chronic obstructive pulmonary disease ([place]    Continue to use albuterol and Spiriva.  Prescribed prednisone taper.       Relevant Medications   predniSONE (STERAPRED UNI-PAK 21 TAB) 10 MG (21) TBPK tablet   guaiFENesin (MUCINEX) 600 MG 12 hr tablet   Other Visit Diagnoses     Viral upper respiratory tract infection    -  Primary   Continue using saline nasal spray and allergy pill.  Recommend starting Mucinex and will send in steroid taper.  Also use Flonase.  Push fluids and get rest   Relevant Medications   predniSONE (STERAPRED UNI-PAK 21 TAB) 10 MG (21) TBPK tablet   guaiFENesin (MUCINEX) 600 MG 12  hr  tablet        Follow up plan: Return if symptoms worsen or fail to improve.

## 2022-06-30 DIAGNOSIS — I5022 Chronic systolic (congestive) heart failure: Secondary | ICD-10-CM | POA: Diagnosis not present

## 2022-06-30 DIAGNOSIS — I4891 Unspecified atrial fibrillation: Secondary | ICD-10-CM | POA: Diagnosis not present

## 2022-06-30 DIAGNOSIS — Z79899 Other long term (current) drug therapy: Secondary | ICD-10-CM | POA: Diagnosis not present

## 2022-06-30 DIAGNOSIS — Z7901 Long term (current) use of anticoagulants: Secondary | ICD-10-CM | POA: Diagnosis not present

## 2022-07-03 DIAGNOSIS — R791 Abnormal coagulation profile: Secondary | ICD-10-CM | POA: Diagnosis not present

## 2022-07-07 ENCOUNTER — Encounter: Payer: Self-pay | Admitting: Family Medicine

## 2022-07-07 ENCOUNTER — Ambulatory Visit (INDEPENDENT_AMBULATORY_CARE_PROVIDER_SITE_OTHER): Payer: Medicare Other | Admitting: Family Medicine

## 2022-07-07 VITALS — BP 128/80 | HR 80 | Temp 98.2°F | Resp 16 | Ht 63.0 in | Wt 176.8 lb

## 2022-07-07 DIAGNOSIS — M10011 Idiopathic gout, right shoulder: Secondary | ICD-10-CM

## 2022-07-07 DIAGNOSIS — I10 Essential (primary) hypertension: Secondary | ICD-10-CM | POA: Diagnosis not present

## 2022-07-07 DIAGNOSIS — I480 Paroxysmal atrial fibrillation: Secondary | ICD-10-CM | POA: Diagnosis not present

## 2022-07-07 DIAGNOSIS — I5042 Chronic combined systolic (congestive) and diastolic (congestive) heart failure: Secondary | ICD-10-CM | POA: Diagnosis not present

## 2022-07-07 DIAGNOSIS — E213 Hyperparathyroidism, unspecified: Secondary | ICD-10-CM | POA: Diagnosis not present

## 2022-07-07 DIAGNOSIS — I272 Pulmonary hypertension, unspecified: Secondary | ICD-10-CM | POA: Diagnosis not present

## 2022-07-07 DIAGNOSIS — Z853 Personal history of malignant neoplasm of breast: Secondary | ICD-10-CM | POA: Diagnosis not present

## 2022-07-07 DIAGNOSIS — J410 Simple chronic bronchitis: Secondary | ICD-10-CM | POA: Diagnosis not present

## 2022-07-07 DIAGNOSIS — E785 Hyperlipidemia, unspecified: Secondary | ICD-10-CM | POA: Diagnosis not present

## 2022-07-07 DIAGNOSIS — Z23 Encounter for immunization: Secondary | ICD-10-CM | POA: Diagnosis not present

## 2022-07-07 DIAGNOSIS — E559 Vitamin D deficiency, unspecified: Secondary | ICD-10-CM | POA: Diagnosis not present

## 2022-07-07 MED ORDER — FEBUXOSTAT 40 MG PO TABS: 40.0000 mg | ORAL_TABLET | Freq: Every day | ORAL | 1 refills | Status: DC

## 2022-07-07 NOTE — Progress Notes (Signed)
Name: [name]   MRN: [id number]    DOB: November 22, 1940   Date:07/07/2022       Progress Note  Subjective  Chief Complaint  Follow up   HPI  Chronic combined CHF/pulmonary hypertension : she is on Demadex, potassium and magnesium daily, leg cramps has improved. Off digoxin but is taking metoprolol for rate control and is back on ARB, taking half a pill of Losartan 25 mg .  No chest , but has SOB  with moderate activity , she denies orthopnea, denies lower extremity edema.   Echo from 09/21  MILD SEGMENTAL LV SYSTOLIC DYSFUNCTION WITH AN ESTIMATED EF = 40-45 %  NORMAL RIGHT VENTRICULAR SYSTOLIC FUNCTION  MODERATE TRICUSPID AND MITRAL VALVE INSUFFICIENCY  TRACE AORTIC VALVE INSUFFICIENCY  NO VALVULAR STENOSIS  MILD RV ENLARGEMENT  MILD BIATRIAL ENLARGEMENT  MODERATE LVH  S/P MITRAL VALVE REPAIR  S/P AORTIC VALVE REPLACEMENT  AORTIC VALVE BIOPROSTHESIS APPEARS TO BE WELL-SEATED    HTN: taking metoprolol and half dose of Losartan  , bp is well controlled  No chest pain she has mild SOB with moderate activity.    Hyperlipidemia: she stopped taking Atorvastatin but is taking Zetia, we sent rx of PCSK 9 but she states states she does not want injectables at this time  Last LDL not at goal, but improved, it used to be over 200 and it was down to 115. She  is now on Nexlizet and is tolerating it well, we will recheck next visit    Insomnia: she is taking Trazodone and it helps her fall and stay asleep, sometimes she skips dose or takes half pill and does not sleep as well.    Gout: she is currently having an acute out attack on right first toe, she was given colchicine by Dr. Sabra Heck in the past and is taking it as prescribed ,  she cannot take prednisone since it affects her INR. She took 3 doses on the first day and taking medication daily now and continues to have symptoms but doing better. She takes allopurinol 300 mg , she states it works unless she eats pinto beans - she had two bowls  before it happened. Discussed going up on the dose if she keeps having episodes   History of Paget's disease of left breast: she noticed a growth on left nipple Fall 2021 , she came in after a couple of months, was referred Dr. Bary Castilla had a biopsy followed by excisional biopsy and had 4 weeks of radiation with Dr. Donella Stade , completed treatment.  She is also still up to date with Dr. Fleet Contras Unchanged    Dysmetabolic Syndrome:  She denies polyphagia, or polydipsia Last hgbA1C was 5.7%, she is on a low carb diet, weight is stable  Polyuria is due to diuretics    Afib/history of aorta valve replacement in 2014 : rate controlled, no fluttering sensation, taking betablocker and off digoxin, also on coumadin and goes to coumadin clinic/Dr. Callwood. Denies bleeding or bruising no blood in stools ,monitored by cardiologist    Chronic bronchitis :  She states she no longer coughing in the mornings but wakes up feeling SOB and uses Spiriva prn only . She quit smoking in 2010.   Depression: she has a long history of depression, used to take SSRI's, symptoms now are intermittent and has been in remission for months, sleep is controlled with Trazodone 50 mg at night . She denies suicidal thoughts or ideation. 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 She denies heartburn, bone pain, or GERD. She has intermittent muscle cramps   Patient Active Problem List   Diagnosis Date Noted   Major depression in remission ([place]) 11/05/2021   Hyperparathyroidism ([place]) 11/05/2021   Paget's disease of right breast ([place]) 07/18/2020   Tibialis posterior tendinitis 03/08/2020   On Coumadin for atrial fibrillation ([place]) 06/30/2018   Chronic anticoagulation 08/27/2017   History of right breast cancer  08/27/2017   Chronic obstructive pulmonary disease ([place]) 07/04/2015   Controlled gout 06/06/2015   Anxiety 02/09/2015   Synovial cyst of popliteal space 02/09/2015   Benign hypertension 02/09/2015   Blood type A+ 02/09/2015   Arterial branch occlusion of retina 02/09/2015   Chronic constipation 02/09/2015   Insomnia, persistent 02/09/2015   Chronic combined systolic and diastolic CHF, NYHA class 1 ([place]) 02/09/2015   Dyslipidemia 02/09/2015   Atrial fibrillation ([place]) 02/09/2015   Arthritis urica 02/09/2015   Hearing loss 02/09/2015   History of open heart surgery 02/09/2015   Chronic hoarseness 02/09/2015   Cardiomegaly 32/35/5732   Dysmetabolic syndrome 20/25/4270   Migraine with aura and without status migrainosus, not intractable 02/09/2015   Hypertensive pulmonary vascular disease ([place]) 02/09/2015   Allergic rhinitis, seasonal 02/09/2015   Vitamin D deficiency 02/09/2015   History of mitral valve repair 10/01/2012   History of aortic valve replacement 10/01/2012   Congestive heart failure ([place]) 10/01/2012    Past Surgical History:  Procedure Laterality Date   ABDOMINAL HYSTERECTOMY  1996   arm surgery  2019   BREAST BIOPSY Right 1998   neg   BREAST BIOPSY Right 2007   neg   BREAST BIOPSY Right 1992   positive   BREAST EXCISIONAL BIOPSY Left 2000   neg   BREAST LUMPECTOMY Right 1992   BREAST LUMPECTOMY Left 08/17/2020   Procedure: BREAST LUMPECTOMY;  Surgeon: Robert Bellow, MD;  Location: [facility] ORS;  Service: General;  Laterality: Left;   CARDIAC VALVE REPLACEMENT  2014   CATARACT EXTRACTION W/PHACO Left 03/30/2018   Procedure: CATARACT EXTRACTION PHACO AND INTRAOCULAR LENS PLACEMENT ([place]);  Surgeon: Birder Robson, MD;  Location: [facility] ORS;  Service: Ophthalmology;  Laterality: Left;  [place] 00:31.9 AP% 12.1 CDE 3.87 Fluid Pack lot # [id number]   CESAREAN SECTION     FRACTURE SURGERY Left    ARM/ LEG   LEG SURGERY      Family History  Problem Relation Age of  Onset   Cancer Mother    Diabetes Mother    Hypertension Mother    Breast cancer Mother 10   Hypertension Father    Heart failure Father    Cancer Brother        bladder   Cancer Sister        breast   Breast cancer Sister 37   Hypertension Son    Hypertension Daughter    Breast cancer Sister 61    Social History   Tobacco Use   Smoking status: Former    Types: Cigarettes    Quit date: 09/09/2007    Years since quitting: 14.8   Smokeless tobacco: Never  Substance Use Topics   Alcohol use: No    Alcohol/week: 0.0 standard drinks of alcohol     Current Outpatient Medications:  acetaminophen (TYLENOL) 500 MG tablet, Take 1,000 mg by mouth every 6 (six) hours as needed for mild pain or moderate pain. , Disp: , Rfl:    albuterol (VENTOLIN HFA) 108 (90 Base) MCG/ACT inhaler, Inhale 2 puffs into the lungs every 6 (six) hours as needed., Disp: , Rfl:    allopurinol (ZYLOPRIM) 300 MG tablet, Take 1 tablet (300 mg total) by mouth daily., Disp: 90 tablet, Rfl: 1   Bempedoic Acid-Ezetimibe (NEXLIZET) 180-10 MG TABS, Take 1 tablet by mouth daily at 12 noon., Disp: 90 tablet, Rfl: 1   Ergocalciferol (VITAMIN D2) 50 MCG (2000 UT) TABS, Take 2,000 Units by mouth daily., Disp: , Rfl:    KLOR-CON M20 20 MEQ tablet, TAKE 1 TABLET BY MOUTH TWICE A DAY, Disp: 180 tablet, Rfl: 1   metoprolol tartrate (LOPRESSOR) 25 MG tablet, Take 1 tablet (25 mg total) by mouth 2 (two) times daily., Disp: 180 tablet, Rfl: 1   Multiple Vitamins-Minerals (VITAMIN D3 COMPLETE PO), Take 1 tablet by mouth every other day., Disp: , Rfl:    SPIRIVA HANDIHALER 18 MCG inhalation capsule, INHALE 1 CASPULE VIA HANDIHALER ONCE A DAY AT THE SAME TIME EVERY DAY, Disp: 30 capsule, Rfl: 1   torsemide (DEMADEX) 20 MG tablet, Take 1 tablet (20 mg total) by mouth daily., Disp: 90 tablet, Rfl: 1   traZODone (DESYREL) 50 MG tablet, Take 1 tablet (50 mg total) by mouth at bedtime as needed., Disp: 90 tablet, Rfl: 1   warfarin  (COUMADIN) 5 MG tablet, Take 5-7.5 mg by mouth See admin instructions. Take 5 mg on Mon., Wed., Sunday. All the other days take 7.5 mg, Disp: , Rfl:   Allergies  Allergen Reactions   Codeine Nausea And Vomiting and Nausea Only   Ace Inhibitors Cough and Other (See Comments)   Amoxicillin-Pot Clavulanate Nausea And Vomiting   Penicillins Nausea And Vomiting and Other (See Comments)    Has patient had a PCN reaction causing immediate rash, facial/tongue/throat swelling, SOB or lightheadedness with hypotension: No Has patient had a PCN reaction causing severe rash involving mucus membranes or skin necrosis: No Has patient had a PCN reaction that required hospitalization No Has patient had a PCN reaction occurring within the last 10 years: No If all of the above answers are "NO", then may proceed with Cephalosporin use.   Rosuvastatin Other (See Comments)    Reaction:  Joint pain    Sulfa Antibiotics Other (See Comments)   Tetanus-Diphtheria Toxoids Td Swelling    I personally reviewed active problem list, medication list, allergies, family history, social history with the patient/caregiver today.   ROS  Constitutional: Negative for fever or weight change.  Respiratory: Negative for cough and shortness of breath.   Cardiovascular: Negative for chest pain or palpitations.  Gastrointestinal: Negative for abdominal pain, no bowel changes.  Musculoskeletal: positive for gait problem and positive right first toe MCP joint swelling.  Skin: Negative for rash.  Neurological: Negative for dizziness or headache.  No other specific complaints in a complete review of systems (except as listed in HPI above).   Objective  Vitals:   07/07/22 1452  BP: 128/80  Pulse: 80  Resp: 16  Temp: 98.2 F (36.8 C)  TempSrc: Oral  SpO2: 98%  Weight: 176 lb 12.8 oz (80.2 kg)  Height: '5\' 3"'$  (1.6 m)    Body mass index is 31.32 kg/m.  Physical Exam  Constitutional: Patient appears well-developed and  well-nourished. Obese  No distress.  HEENT: head atraumatic,  normocephalic, pupils equal and reactive to light, neck supple Cardiovascular: Normal rate, regular rhythm and normal heart sounds.  No ankle edema Muscular  skeletal: right bunion is swollen and tender with some erythema and increase in warmth  Pulmonary/Chest: Effort normal and breath sounds normal. No respiratory distress. Abdominal: Soft.  There is no tenderness. Psychiatric: Patient has a normal mood and affect. behavior is normal. Judgment and thought content normal.    PHQ2/9:    07/07/2022    2:54 PM 06/23/2022   11:44 AM 03/05/2022    3:08 PM 11/05/2021    2:16 PM 10/15/2021    9:36 AM  Depression screen PHQ 2/9  Decreased Interest 0 0 0 0 0  Down, Depressed, Hopeless 0 0 0 0 0  PHQ - 2 Score 0 0 0 0 0  Altered sleeping 0   0 0  Tired, decreased energy 0   0 0  Change in appetite 0   0 0  Feeling bad or failure about yourself  0   0 0  Trouble concentrating 0   0 0  Moving slowly or fidgety/restless 0   0 0  Suicidal thoughts 0   0 0  PHQ-9 Score 0   0 0  Difficult doing work/chores    Not difficult at all Not difficult at all    phq 9 is negative  Fall Risk:    07/07/2022    2:53 PM 06/23/2022   11:43 AM 03/05/2022    3:08 PM 11/05/2021    2:16 PM 10/15/2021    9:36 AM  Fall Risk   Falls in the past year? 0 0 0 0 0  Number falls in past yr:  0 0 0 0  Injury with Fall?  0 0 0 0  Risk for fall due to : No Fall Risks  No Fall Risks No Fall Risks   Follow up Falls prevention discussed;Education provided;Falls evaluation completed Falls evaluation completed Falls prevention discussed Falls prevention discussed      Functional Status Survey: Is the patient deaf or have difficulty hearing?: No Does the patient have difficulty seeing, even when wearing glasses/contacts?: No Does the patient have difficulty concentrating, remembering, or making decisions?: No Does the patient have difficulty walking or  climbing stairs?: No Does the patient have difficulty dressing or bathing?: No Does the patient have difficulty doing errands alone such as visiting a doctor's office or shopping?: No    Assessment & Plan  1. Chronic combined systolic and diastolic CHF, NYHA class 1 (HCC)  Compliant with therapy and doing well   2. Simple chronic bronchitis ([facility])  Stable   3. Hyperparathyroidism ([place])  Last pth elevated  4. Paroxysmal atrial fibrillation ([facility])  Rate controlled, on coumadin   5. Pulmonary hypertension ([place])  Monitored by cardiologist   6. Benign hypertension  At goal   7. Dyslipidemia  On Nexlizet   8. Vitamin D deficiency  Continue supplementation   9. Need for immunization against influenza  - Flu Vaccine QUAD High Dose(Fluad)  10. History of Paget's disease of breast  Seen by Dr. Fleet Contras   11. Acute idiopathic gout of right shoulder  We will try switching to Uloric

## 2022-07-10 DIAGNOSIS — Z7901 Long term (current) use of anticoagulants: Secondary | ICD-10-CM | POA: Diagnosis not present

## 2022-07-14 ENCOUNTER — Other Ambulatory Visit: Payer: Self-pay | Admitting: General Surgery

## 2022-07-14 ENCOUNTER — Telehealth: Payer: Self-pay | Admitting: Family Medicine

## 2022-07-14 DIAGNOSIS — C50012 Malignant neoplasm of nipple and areola, left female breast: Secondary | ICD-10-CM

## 2022-07-14 NOTE — Telephone Encounter (Unsigned)
Copied from [place] [id number]. Topic: General - Inquiry >> Jul 14, 2022  3:02 PM Rosanne Ashing P wrote: Reason for CRM: pt called saying the new prescription Dr. Ancil Boozer called in for her has a reaction with her havig CHF.  She wants to spesk to a nurse. CB#  [phone]

## 2022-07-24 DIAGNOSIS — Z79899 Other long term (current) drug therapy: Secondary | ICD-10-CM | POA: Diagnosis not present

## 2022-07-24 DIAGNOSIS — I5022 Chronic systolic (congestive) heart failure: Secondary | ICD-10-CM | POA: Diagnosis not present

## 2022-07-24 DIAGNOSIS — Z7901 Long term (current) use of anticoagulants: Secondary | ICD-10-CM | POA: Diagnosis not present

## 2022-07-24 DIAGNOSIS — I4891 Unspecified atrial fibrillation: Secondary | ICD-10-CM | POA: Diagnosis not present

## 2022-07-28 ENCOUNTER — Telehealth: Payer: Self-pay | Admitting: Family Medicine

## 2022-07-28 DIAGNOSIS — R070 Pain in throat: Secondary | ICD-10-CM | POA: Diagnosis not present

## 2022-07-28 DIAGNOSIS — J019 Acute sinusitis, unspecified: Secondary | ICD-10-CM | POA: Diagnosis not present

## 2022-07-28 DIAGNOSIS — R0982 Postnasal drip: Secondary | ICD-10-CM | POA: Diagnosis not present

## 2022-07-28 NOTE — Telephone Encounter (Signed)
Pt called in to update office, she received there call. Pt says that she went to UC and no longer need an appt with PCP.

## 2022-07-28 NOTE — Telephone Encounter (Signed)
Called patient  lvm to give her an appt. She can be placed with any provider that has an avail slot. Does not have to be [employer]

## 2022-08-07 DIAGNOSIS — I5022 Chronic systolic (congestive) heart failure: Secondary | ICD-10-CM | POA: Diagnosis not present

## 2022-08-07 DIAGNOSIS — Z79899 Other long term (current) drug therapy: Secondary | ICD-10-CM | POA: Diagnosis not present

## 2022-08-07 DIAGNOSIS — I4891 Unspecified atrial fibrillation: Secondary | ICD-10-CM | POA: Diagnosis not present

## 2022-08-07 DIAGNOSIS — Z7901 Long term (current) use of anticoagulants: Secondary | ICD-10-CM | POA: Diagnosis not present

## 2022-08-08 ENCOUNTER — Other Ambulatory Visit: Payer: BC Managed Care – PPO

## 2022-08-13 ENCOUNTER — Ambulatory Visit
Admission: RE | Admit: 2022-08-13 | Discharge: 2022-08-13 | Disposition: A | Discharge status: Home / Self Care | Payer: Medicare Other | Source: Ambulatory Visit | Attending: General Surgery | Admitting: General Surgery

## 2022-08-13 DIAGNOSIS — Z853 Personal history of malignant neoplasm of breast: Secondary | ICD-10-CM | POA: Diagnosis not present

## 2022-08-13 DIAGNOSIS — R92333 Mammographic heterogeneous density, bilateral breasts: Secondary | ICD-10-CM | POA: Diagnosis not present

## 2022-08-13 DIAGNOSIS — C50012 Malignant neoplasm of nipple and areola, left female breast: Secondary | ICD-10-CM

## 2022-08-21 DIAGNOSIS — C50012 Malignant neoplasm of nipple and areola, left female breast: Secondary | ICD-10-CM | POA: Diagnosis not present

## 2022-09-05 DIAGNOSIS — I5022 Chronic systolic (congestive) heart failure: Secondary | ICD-10-CM | POA: Diagnosis not present

## 2022-09-05 DIAGNOSIS — Z79899 Other long term (current) drug therapy: Secondary | ICD-10-CM | POA: Diagnosis not present

## 2022-09-05 DIAGNOSIS — Z7901 Long term (current) use of anticoagulants: Secondary | ICD-10-CM | POA: Diagnosis not present

## 2022-09-05 DIAGNOSIS — I4891 Unspecified atrial fibrillation: Secondary | ICD-10-CM | POA: Diagnosis not present

## 2022-09-28 DIAGNOSIS — Z20822 Contact with and (suspected) exposure to covid-19: Secondary | ICD-10-CM | POA: Diagnosis not present

## 2022-09-28 DIAGNOSIS — R059 Cough, unspecified: Secondary | ICD-10-CM | POA: Diagnosis not present

## 2022-09-28 DIAGNOSIS — J069 Acute upper respiratory infection, unspecified: Secondary | ICD-10-CM | POA: Diagnosis not present

## 2022-10-03 DIAGNOSIS — J329 Chronic sinusitis, unspecified: Secondary | ICD-10-CM | POA: Diagnosis not present

## 2022-10-06 DIAGNOSIS — Z7901 Long term (current) use of anticoagulants: Secondary | ICD-10-CM | POA: Diagnosis not present

## 2022-10-06 DIAGNOSIS — I5022 Chronic systolic (congestive) heart failure: Secondary | ICD-10-CM | POA: Diagnosis not present

## 2022-10-06 DIAGNOSIS — Z79899 Other long term (current) drug therapy: Secondary | ICD-10-CM | POA: Diagnosis not present

## 2022-10-06 DIAGNOSIS — I4891 Unspecified atrial fibrillation: Secondary | ICD-10-CM | POA: Diagnosis not present

## 2022-10-07 ENCOUNTER — Ambulatory Visit: Payer: Medicare Other | Admitting: Family Medicine

## 2022-10-14 ENCOUNTER — Other Ambulatory Visit: Payer: Self-pay | Admitting: Family Medicine

## 2022-10-14 DIAGNOSIS — J410 Simple chronic bronchitis: Secondary | ICD-10-CM

## 2022-10-15 DIAGNOSIS — R070 Pain in throat: Secondary | ICD-10-CM | POA: Diagnosis not present

## 2022-10-15 DIAGNOSIS — J069 Acute upper respiratory infection, unspecified: Secondary | ICD-10-CM | POA: Diagnosis not present

## 2022-10-18 ENCOUNTER — Other Ambulatory Visit: Payer: Self-pay | Admitting: Family Medicine

## 2022-10-18 DIAGNOSIS — E785 Hyperlipidemia, unspecified: Secondary | ICD-10-CM

## 2022-11-03 ENCOUNTER — Other Ambulatory Visit: Payer: Self-pay | Admitting: Family Medicine

## 2022-11-03 DIAGNOSIS — I10 Essential (primary) hypertension: Secondary | ICD-10-CM

## 2022-11-03 DIAGNOSIS — I5042 Chronic combined systolic (congestive) and diastolic (congestive) heart failure: Secondary | ICD-10-CM

## 2022-11-06 DIAGNOSIS — Z961 Presence of intraocular lens: Secondary | ICD-10-CM | POA: Diagnosis not present

## 2022-11-06 DIAGNOSIS — H02889 Meibomian gland dysfunction of unspecified eye, unspecified eyelid: Secondary | ICD-10-CM | POA: Diagnosis not present

## 2022-11-07 NOTE — Progress Notes (Unsigned)
**Note Kelly-Identified via Obfuscation** Name: [name]   MRN: [id number]    DOB: [birth year]   Date:11/10/2022       Progress Note  Subjective  Chief Complaint  Follow Up  HPI  Chronic combined CHF/pulmonary hypertension : she is on Demadex daily , potassium and magnesium daily She has been off  digoxin but is taking metoprolol for rate control and is back on ARB  No chest , but has SOB  with moderate activity , she denies orthopnea, denies lower extremity edema. She still works at [employer] from 09/21  MILD SEGMENTAL LV SYSTOLIC DYSFUNCTION WITH AN ESTIMATED EF = 40-45 %  NORMAL RIGHT VENTRICULAR SYSTOLIC FUNCTION  MODERATE TRICUSPID AND MITRAL VALVE INSUFFICIENCY  TRACE AORTIC VALVE INSUFFICIENCY  NO VALVULAR STENOSIS  MILD RV ENLARGEMENT  MILD BIATRIAL ENLARGEMENT  MODERATE LVH  S/P MITRAL VALVE REPAIR  S/P AORTIC VALVE REPLACEMENT  AORTIC VALVE BIOPROSTHESIS APPEARS TO BE WELL-SEATED    HTN: taking metoprolol and  Losartan  , bp is well controlled  No chest pain she has mild SOB with moderate activity.    Hyperlipidemia: she stopped taking Atorvastatin but is taking Zetia, we sent rx of PCSK 9 but she states states she does not want injectables at this time  Last LDL not at goal, but improved, it used to be over 200 and it was down to 115. She  is now on Nexlizet and is tolerating it well, we will recheck labs tomorrow - she will return fasting due to elevated triglycerides    Insomnia: she is taking Trazodone and it helps her fall and stay asleep, sometimes she skips dose , she denies side effects of medication   Gout: last flare was on her last visit, taking allopurinol, doing well today   History of Paget's disease of left breast: she noticed a growth on left nipple Fall 2021 , she came in after a couple of months, was referred Dr. Bary Castilla had a biopsy followed by excisional biopsy and had 4 weeks of radiation with Dr. Donella Stade , completed treatment.    Dysmetabolic Syndrome:  She denies polyphagia,  or polydipsia Last hgbA1C was 5.7%, she is on a low carb diet, weight is stable  Polyuria is due to diuretics . Unchanged    Afib/history of aorta valve replacement in 2014 : rate controlled, no fluttering sensation, taking betablocker  also on coumadin and goes to coumadin clinic/Dr. Callwood, INR today was 2. Denies bleeding or bruising no blood in stools    Chronic bronchitis :  She states she no longer coughing in the mornings but wakes up feeling SOB and uses Spiriva prn only . She quit smoking in 2010.Stable    Depression: she has a long history of depression, used to take SSRI's, symptoms now are intermittent and has been in remission for months, sleep is controlled with Trazodone 50 mg at night . She denies suicidal thoughts or ideation. She is motivated, she is working at [employer] 5 days a week for 6 hours, she states she is feeling great    Hyperparathyroidism: she saw nephrologist and was referred to Endo, possible primary versus secondary hyperparathyroidism, she does not want to go back, last Pth was still elevated at 90  . She denies heartburn, bone pain, or GERD. She has intermittent muscle cramps We will recheck labs and if calcium elevated or pth higher she agreed on going back to see endo   Patient Active Problem List   Diagnosis Date Noted  Major depression in remission ([place]) 11/05/2021   Hyperparathyroidism ([place]) 11/05/2021   Tibialis posterior tendinitis 03/08/2020   On Coumadin for atrial fibrillation ([place]) 06/30/2018   Chronic anticoagulation 08/27/2017   History of Paget's disease of breast 08/27/2017   Chronic obstructive pulmonary disease ([place]) 07/04/2015   Controlled gout 06/06/2015   Anxiety 02/09/2015   Synovial cyst of popliteal space 02/09/2015   Benign hypertension 02/09/2015   Blood type A+ 02/09/2015   Arterial branch occlusion of retina 02/09/2015   Chronic constipation 02/09/2015   Insomnia, persistent 02/09/2015   Chronic combined systolic and  diastolic CHF, NYHA class 1 ([place]) 02/09/2015   Dyslipidemia 02/09/2015   Atrial fibrillation ([place]) 02/09/2015   Arthritis urica 02/09/2015   Hearing loss 02/09/2015   History of open heart surgery 02/09/2015   Chronic hoarseness 02/09/2015   Cardiomegaly 0000000   Dysmetabolic syndrome 0000000   Migraine with aura and without status migrainosus, not intractable 02/09/2015   Hypertensive pulmonary vascular disease ([place]) 02/09/2015   Allergic rhinitis, seasonal 02/09/2015   Vitamin D deficiency 02/09/2015   History of mitral valve repair 10/01/2012   History of aortic valve replacement 10/01/2012   Congestive heart failure ([place]) 10/01/2012    Past Surgical History:  Procedure Laterality Date   ABDOMINAL HYSTERECTOMY  1996   arm surgery  2019   BREAST BIOPSY Right 1998   neg   BREAST BIOPSY Right 2007   neg   BREAST BIOPSY Right 1992   positive   BREAST EXCISIONAL BIOPSY Left 2000   neg   BREAST LUMPECTOMY Right 1992   BREAST LUMPECTOMY Left 08/17/2020   Procedure: BREAST LUMPECTOMY;  Surgeon: Robert Bellow, MD;  Location: [facility] ORS;  Service: General;  Laterality: Left;   CARDIAC VALVE REPLACEMENT  2014   CATARACT EXTRACTION W/PHACO Left 03/30/2018   Procedure: CATARACT EXTRACTION PHACO AND INTRAOCULAR LENS PLACEMENT ([place]);  Surgeon: Birder Robson, MD;  Location: [facility] ORS;  Service: Ophthalmology;  Laterality: Left;  [place] 00:31.9 AP% 12.1 CDE 3.87 Fluid Pack lot # [id number]   CESAREAN SECTION     FRACTURE SURGERY Left    ARM/ LEG   LEG SURGERY      Family History  Problem Relation Age of Onset   Cancer Mother    Diabetes Mother    Hypertension Mother    Breast cancer Mother 92   Hypertension Father    Heart failure Father    Cancer Brother        bladder   Cancer Sister        breast   Breast cancer Sister 13   Hypertension Son    Hypertension Daughter    Breast cancer Sister 75    Social History   Tobacco Use   Smoking status: Former     Types: Cigarettes    Quit date: 09/09/2007    Years since quitting: 15.1   Smokeless tobacco: Never  Substance Use Topics   Alcohol use: No    Alcohol/week: 0.0 standard drinks of alcohol     Current Outpatient Medications:    acetaminophen (TYLENOL) 500 MG tablet, Take 1,000 mg by mouth every 6 (six) hours as needed for mild pain or moderate pain. , Disp: , Rfl:    albuterol (VENTOLIN HFA) 108 (90 Base) MCG/ACT inhaler, Inhale 2 puffs into the lungs every 6 (six) hours as needed., Disp: , Rfl:    Ergocalciferol (VITAMIN D2) 50 MCG (2000 UT) TABS, Take 2,000 Units by mouth daily., Disp: , Rfl:  febuxostat (ULORIC) 40 MG tablet, Take 1 tablet (40 mg total) by mouth daily. In place of allopurinol, Disp: 90 tablet, Rfl: 1   losartan (COZAAR) 25 MG tablet, Take 25 mg by mouth daily., Disp: , Rfl:    metoprolol tartrate (LOPRESSOR) 25 MG tablet, Take 1 tablet (25 mg total) by mouth 2 (two) times daily., Disp: 180 tablet, Rfl: 1   MIEBO 1.338 GM/ML SOLN, Apply 1 drop to eye 4 (four) times daily., Disp: , Rfl:    Multiple Vitamins-Minerals (VITAMIN D3 COMPLETE PO), Take 1 tablet by mouth every other day., Disp: , Rfl:    SPIRIVA HANDIHALER 18 MCG inhalation capsule, INHALE 1 CASPULE VIA HANDIHALER ONCE A DAY AT THE SAME TIME EVERY DAY, Disp: 30 capsule, Rfl: 1   warfarin (COUMADIN) 5 MG tablet, Take 5-7.5 mg by mouth See admin instructions. Take 5 mg on Mon., Wed., Sunday. All the other days take 7.5 mg, Disp: , Rfl:    Bempedoic Acid-Ezetimibe (NEXLIZET) 180-10 MG TABS, Take 1 tablet by mouth daily at 12 noon., Disp: 90 tablet, Rfl: 1   potassium chloride SA (KLOR-CON M20) 20 MEQ tablet, Take 1 tablet (20 mEq total) by mouth 2 (two) times daily., Disp: 180 tablet, Rfl: 1   torsemide (DEMADEX) 20 MG tablet, Take 1 tablet (20 mg total) by mouth daily., Disp: 90 tablet, Rfl: 1   traZODone (DESYREL) 50 MG tablet, Take 1 tablet (50 mg total) by mouth at bedtime as needed., Disp: 90 tablet, Rfl:  0  Allergies  Allergen Reactions   Codeine Nausea And Vomiting and Nausea Only   Ace Inhibitors Cough and Other (See Comments)   Amoxicillin-Pot Clavulanate Nausea And Vomiting   Penicillins Nausea And Vomiting and Other (See Comments)    Has patient had a PCN reaction causing immediate rash, facial/tongue/throat swelling, SOB or lightheadedness with hypotension: No Has patient had a PCN reaction causing severe rash involving mucus membranes or skin necrosis: No Has patient had a PCN reaction that required hospitalization No Has patient had a PCN reaction occurring within the last 10 years: No If all of the above answers are "NO", then may proceed with Cephalosporin use.   Rosuvastatin Other (See Comments)    Reaction:  Joint pain    Sulfa Antibiotics Other (See Comments)   Tetanus-Diphtheria Toxoids Td Swelling    I personally reviewed active problem list, medication list, allergies, family history, social history, health maintenance with the patient/caregiver today.   ROS  Constitutional: Negative for fever or weight change.  Respiratory: Negative for cough and shortness of breath.   Cardiovascular: Negative for chest pain or palpitations.  Gastrointestinal: Negative for abdominal pain, no bowel changes.  Musculoskeletal: Negative for gait problem or joint swelling.  Skin: Negative for rash.  Neurological: Negative for dizziness or headache.  No other specific complaints in a complete review of systems (except as listed in HPI above).   Objective  Vitals:   11/10/22 1523  BP: 124/74  Pulse: 74  Resp: 18  Temp: 98.3 F (36.8 C)  TempSrc: Oral  SpO2: 96%  Weight: 176 lb 3.2 oz (79.9 kg)  Height: 5' 3.5" (1.613 m)    Body mass index is 30.72 kg/m.  Physical Exam  Constitutional: Patient appears well-developed and well-nourished. Obese  No distress.  HEENT: head atraumatic, normocephalic, pupils equal and reactive to light, neck supple Cardiovascular: Normal rate,  irregular rhythm and normal heart sounds.  2 plus  murmur Trace BLE edema. Pulmonary/Chest: Effort normal and breath  sounds normal. No respiratory distress. Abdominal: Soft.  There is no tenderness. Psychiatric: Patient has a normal mood and affect. behavior is normal. Judgment and thought content normal.   PHQ2/9:    11/10/2022    3:39 PM 07/07/2022    2:54 PM 06/23/2022   11:44 AM 03/05/2022    3:08 PM 11/05/2021    2:16 PM  Depression screen PHQ 2/9  Decreased Interest 0 0 0 0 0  Down, Depressed, Hopeless 0 0 0 0 0  PHQ - 2 Score 0 0 0 0 0  Altered sleeping 0 0   0  Tired, decreased energy 0 0   0  Change in appetite 0 0   0  Feeling bad or failure about yourself  0 0   0  Trouble concentrating 0 0   0  Moving slowly or fidgety/restless 0 0   0  Suicidal thoughts 0 0   0  PHQ-9 Score 0 0   0  Difficult doing work/chores     Not difficult at all    phq 9 is negative   Fall Risk:    11/10/2022    3:39 PM 07/07/2022    2:53 PM 06/23/2022   11:43 AM 03/05/2022    3:08 PM 11/05/2021    2:16 PM  Fall Risk   Falls in the past year? 0 0 0 0 0  Number falls in past yr:   0 0 0  Injury with Fall?   0 0 0  Risk for fall due to : No Fall Risks No Fall Risks  No Fall Risks No Fall Risks  Follow up Falls prevention discussed;Education provided;Falls evaluation completed Falls prevention discussed;Education provided;Falls evaluation completed Falls evaluation completed Falls prevention discussed Falls prevention discussed     Assessment & Plan  1. Pulmonary hypertension ([place])  Monitored by cardiologist   2. Simple chronic bronchitis ([facility])  Stable   3..Hyperparathyroidism ([place])  We will recheck labs today, if pth going up on calcium spikes we will refer her back to Dr. Ladell Pier   4. Paroxysmal atrial fibrillation ([facility])  Rate controlled, on coumadin   5. Chronic combined systolic and diastolic CHF, NYHA class 1 ([facility])  - torsemide (DEMADEX) 20 MG tablet; Take 1 tablet (20  mg total) by mouth daily.  Dispense: 90 tablet; Refill: 1 - potassium chloride SA (KLOR-CON M20) 20 MEQ tablet; Take 1 tablet (20 mEq total) by mouth 2 (two) times daily.  Dispense: 180 tablet; Refill: 1  6. Major depression in remission ([place])   7. Benign hypertension  - CBC with Differential/Platelet - COMPLETE METABOLIC PANEL WITH GFR - torsemide (DEMADEX) 20 MG tablet; Take 1 tablet (20 mg total) by mouth daily.  Dispense: 90 tablet; Refill: 1  8. Dyslipidemia  - Bempedoic Acid-Ezetimibe (NEXLIZET) 180-10 MG TABS; Take 1 tablet by mouth daily at 12 noon.  Dispense: 90 tablet; Refill: 1 - Lipid panel  9. History of Paget's disease of breast   10. Vitamin D deficiency  - VITAMIN D 25 Hydroxy (Vit-D Deficiency, Fractures)  11. Controlled gout   12. Insomnia, unspecified type  - traZODone (DESYREL) 50 MG tablet; Take 1 tablet (50 mg total) by mouth at bedtime as needed.  Dispense: 90 tablet; Refill: 0  13. Hyperglycemia  - Hemoglobin A1c  14. Statin myopathy

## 2022-11-10 ENCOUNTER — Encounter: Payer: Self-pay | Admitting: Family Medicine

## 2022-11-10 ENCOUNTER — Ambulatory Visit (INDEPENDENT_AMBULATORY_CARE_PROVIDER_SITE_OTHER): Payer: Medicare Other | Admitting: Family Medicine

## 2022-11-10 VITALS — BP 124/74 | HR 74 | Temp 98.3°F | Resp 18 | Ht 63.5 in | Wt 176.2 lb

## 2022-11-10 DIAGNOSIS — F325 Major depressive disorder, single episode, in full remission: Secondary | ICD-10-CM

## 2022-11-10 DIAGNOSIS — E785 Hyperlipidemia, unspecified: Secondary | ICD-10-CM | POA: Diagnosis not present

## 2022-11-10 DIAGNOSIS — E559 Vitamin D deficiency, unspecified: Secondary | ICD-10-CM | POA: Diagnosis not present

## 2022-11-10 DIAGNOSIS — T466X5A Adverse effect of antihyperlipidemic and antiarteriosclerotic drugs, initial encounter: Secondary | ICD-10-CM

## 2022-11-10 DIAGNOSIS — I10 Essential (primary) hypertension: Secondary | ICD-10-CM

## 2022-11-10 DIAGNOSIS — G4700 Insomnia, unspecified: Secondary | ICD-10-CM

## 2022-11-10 DIAGNOSIS — I480 Paroxysmal atrial fibrillation: Secondary | ICD-10-CM | POA: Diagnosis not present

## 2022-11-10 DIAGNOSIS — I272 Pulmonary hypertension, unspecified: Secondary | ICD-10-CM

## 2022-11-10 DIAGNOSIS — I4891 Unspecified atrial fibrillation: Secondary | ICD-10-CM | POA: Diagnosis not present

## 2022-11-10 DIAGNOSIS — M109 Gout, unspecified: Secondary | ICD-10-CM | POA: Diagnosis not present

## 2022-11-10 DIAGNOSIS — J410 Simple chronic bronchitis: Secondary | ICD-10-CM

## 2022-11-10 DIAGNOSIS — Z853 Personal history of malignant neoplasm of breast: Secondary | ICD-10-CM

## 2022-11-10 DIAGNOSIS — I5042 Chronic combined systolic (congestive) and diastolic (congestive) heart failure: Secondary | ICD-10-CM | POA: Diagnosis not present

## 2022-11-10 DIAGNOSIS — E213 Hyperparathyroidism, unspecified: Secondary | ICD-10-CM

## 2022-11-10 DIAGNOSIS — I5022 Chronic systolic (congestive) heart failure: Secondary | ICD-10-CM | POA: Diagnosis not present

## 2022-11-10 DIAGNOSIS — Z79899 Other long term (current) drug therapy: Secondary | ICD-10-CM | POA: Diagnosis not present

## 2022-11-10 DIAGNOSIS — R739 Hyperglycemia, unspecified: Secondary | ICD-10-CM

## 2022-11-10 DIAGNOSIS — G720 Drug-induced myopathy: Secondary | ICD-10-CM

## 2022-11-10 DIAGNOSIS — Z7901 Long term (current) use of anticoagulants: Secondary | ICD-10-CM | POA: Diagnosis not present

## 2022-11-10 MED ORDER — NEXLIZET 180-10 MG PO TABS: 1.0000 | ORAL_TABLET | Freq: Every day | ORAL | 1 refills | Status: DC

## 2022-11-10 MED ORDER — TRAZODONE HCL 50 MG PO TABS: 50.0000 mg | ORAL_TABLET | Freq: Every evening | ORAL | 0 refills | Status: DC | PRN

## 2022-11-10 MED ORDER — POTASSIUM CHLORIDE CRYS ER 20 MEQ PO TBCR: 20.0000 meq | EXTENDED_RELEASE_TABLET | Freq: Two times a day (BID) | ORAL | 1 refills | Status: DC

## 2022-11-10 MED ORDER — TORSEMIDE 20 MG PO TABS: 20.0000 mg | ORAL_TABLET | Freq: Every day | ORAL | 1 refills | Status: DC

## 2022-11-11 DIAGNOSIS — I10 Essential (primary) hypertension: Secondary | ICD-10-CM | POA: Diagnosis not present

## 2022-11-11 DIAGNOSIS — E559 Vitamin D deficiency, unspecified: Secondary | ICD-10-CM | POA: Diagnosis not present

## 2022-11-11 DIAGNOSIS — R739 Hyperglycemia, unspecified: Secondary | ICD-10-CM | POA: Diagnosis not present

## 2022-11-11 DIAGNOSIS — E785 Hyperlipidemia, unspecified: Secondary | ICD-10-CM | POA: Diagnosis not present

## 2022-11-12 LAB — COMPLETE METABOLIC PANEL WITH GFR
AG Ratio: 1.8 (calc) (ref 1.0–2.5)
ALT: 18 U/L (ref 6–29)
AST: 25 U/L (ref 10–35)
Albumin: 4.2 g/dL (ref 3.6–5.1)
Alkaline phosphatase (APISO): 61 U/L (ref 37–153)
BUN: 20 mg/dL (ref 7–25)
CO2: 27 mmol/L (ref 20–32)
Calcium: 9.2 mg/dL (ref 8.6–10.4)
Chloride: 105 mmol/L (ref 98–110)
Creat: 0.9 mg/dL (ref 0.60–1.00)
Globulin: 2.4 g/dL (calc) (ref 1.9–3.7)
Glucose, Bld: 98 mg/dL (ref 65–99)
Potassium: 3.5 mmol/L (ref 3.5–5.3)
Sodium: 145 mmol/L (ref 135–146)
Total Bilirubin: 0.5 mg/dL (ref 0.2–1.2)
Total Protein: 6.6 g/dL (ref 6.1–8.1)
eGFR: 65 mL/min/{1.73_m2} (ref >=60)

## 2022-11-12 LAB — HEMOGLOBIN A1C
Hgb A1c MFr Bld: 5.9 % of total Hgb — ABNORMAL HIGH (ref <5.7)
Mean Plasma Glucose: 123 mg/dL
eAG (mmol/L): 6.8 mmol/L

## 2022-11-12 LAB — CBC WITH DIFFERENTIAL/PLATELET
Absolute Monocytes: 506 cells/uL (ref 200–950)
Basophils Absolute: 22 cells/uL (ref 0–200)
Basophils Relative: 0.5 %
Eosinophils Absolute: 119 cells/uL (ref 15–500)
Eosinophils Relative: 2.7 %
HCT: 37.7 % (ref 35.0–45.0)
Hemoglobin: 12.1 g/dL (ref 11.7–15.5)
Lymphs Abs: 1945 cells/uL (ref 850–3900)
MCH: 29.5 pg (ref 27.0–33.0)
MCHC: 32.1 g/dL (ref 32.0–36.0)
MCV: 92 fL (ref 80.0–100.0)
MPV: 11.8 fL (ref 7.5–12.5)
Monocytes Relative: 11.5 %
Neutro Abs: 1808 cells/uL (ref 1500–7800)
Neutrophils Relative %: 41.1 %
Platelets: 255 10*3/uL (ref 140–400)
RBC: 4.1 10*6/uL (ref 3.80–5.10)
RDW: 14.4 % (ref 11.0–15.0)
Total Lymphocyte: 44.2 %
WBC: 4.4 10*3/uL (ref 3.8–10.8)

## 2022-11-12 LAB — LIPID PANEL
Cholesterol: 177 mg/dL (ref <200)
HDL: 45 mg/dL — ABNORMAL LOW (ref >=50)
LDL Cholesterol (Calc): 112 mg/dL (calc) — ABNORMAL HIGH
Non-HDL Cholesterol (Calc): 132 mg/dL (calc) — ABNORMAL HIGH (ref <130)
Total CHOL/HDL Ratio: 3.9 (calc) (ref <5.0)
Triglycerides: 92 mg/dL (ref <150)

## 2022-11-12 LAB — VITAMIN D 25 HYDROXY (VIT D DEFICIENCY, FRACTURES): Vit D, 25-Hydroxy: 56 ng/mL (ref 30–100)

## 2022-11-13 DIAGNOSIS — I351 Nonrheumatic aortic (valve) insufficiency: Secondary | ICD-10-CM | POA: Diagnosis not present

## 2022-11-13 DIAGNOSIS — J42 Unspecified chronic bronchitis: Secondary | ICD-10-CM | POA: Diagnosis not present

## 2022-11-13 DIAGNOSIS — Z952 Presence of prosthetic heart valve: Secondary | ICD-10-CM | POA: Diagnosis not present

## 2022-11-13 DIAGNOSIS — I4891 Unspecified atrial fibrillation: Secondary | ICD-10-CM | POA: Diagnosis not present

## 2022-11-13 DIAGNOSIS — I5022 Chronic systolic (congestive) heart failure: Secondary | ICD-10-CM | POA: Diagnosis not present

## 2022-11-13 DIAGNOSIS — Z9889 Other specified postprocedural states: Secondary | ICD-10-CM | POA: Diagnosis not present

## 2022-11-13 DIAGNOSIS — Z7901 Long term (current) use of anticoagulants: Secondary | ICD-10-CM | POA: Diagnosis not present

## 2022-11-13 DIAGNOSIS — Q231 Congenital insufficiency of aortic valve: Secondary | ICD-10-CM | POA: Diagnosis not present

## 2022-11-16 ENCOUNTER — Other Ambulatory Visit: Payer: Self-pay | Admitting: Family Medicine

## 2022-11-16 DIAGNOSIS — J410 Simple chronic bronchitis: Secondary | ICD-10-CM

## 2022-12-12 DIAGNOSIS — I4891 Unspecified atrial fibrillation: Secondary | ICD-10-CM | POA: Diagnosis not present

## 2022-12-12 DIAGNOSIS — I5022 Chronic systolic (congestive) heart failure: Secondary | ICD-10-CM | POA: Diagnosis not present

## 2022-12-12 DIAGNOSIS — Z7901 Long term (current) use of anticoagulants: Secondary | ICD-10-CM | POA: Diagnosis not present

## 2022-12-12 DIAGNOSIS — Z79899 Other long term (current) drug therapy: Secondary | ICD-10-CM | POA: Diagnosis not present

## 2022-12-17 ENCOUNTER — Other Ambulatory Visit (HOSPITAL_BASED_OUTPATIENT_CLINIC_OR_DEPARTMENT_OTHER): Payer: Self-pay

## 2023-01-15 DIAGNOSIS — Z7901 Long term (current) use of anticoagulants: Secondary | ICD-10-CM | POA: Diagnosis not present

## 2023-01-15 DIAGNOSIS — Z79899 Other long term (current) drug therapy: Secondary | ICD-10-CM | POA: Diagnosis not present

## 2023-01-15 DIAGNOSIS — I4891 Unspecified atrial fibrillation: Secondary | ICD-10-CM | POA: Diagnosis not present

## 2023-01-15 DIAGNOSIS — I5022 Chronic systolic (congestive) heart failure: Secondary | ICD-10-CM | POA: Diagnosis not present

## 2023-01-17 ENCOUNTER — Other Ambulatory Visit: Payer: Self-pay | Admitting: Family Medicine

## 2023-01-19 ENCOUNTER — Other Ambulatory Visit: Payer: Self-pay

## 2023-01-19 NOTE — Telephone Encounter (Signed)
Left voicemail,, awaiting return call for clarification.

## 2023-01-22 ENCOUNTER — Telehealth: Payer: Self-pay

## 2023-01-22 NOTE — Telephone Encounter (Signed)
Dr. Carlynn Purl left message for patient to return call to clarify if she is taking allopurinol or uloric (Zyloprim/Febuxostat) for gout so that a refill can be sent in to the pharmacy if needed.

## 2023-01-23 ENCOUNTER — Telehealth: Payer: Self-pay

## 2023-01-23 ENCOUNTER — Other Ambulatory Visit: Payer: Self-pay

## 2023-01-23 MED ORDER — ALLOPURINOL 300 MG PO TABS: 300.0000 mg | ORAL_TABLET | Freq: Every day | ORAL | 3 refills | Status: DC

## 2023-01-23 NOTE — Telephone Encounter (Signed)
Patient reached and she stated she would like to continue allopurinol instead of switching to Uloric. Dr. Carlynn Purl notified and advised to send in refill for patient as originally requested.

## 2023-01-23 NOTE — Telephone Encounter (Signed)
Patient reached and she stated she would like to continue allopurinol instead of switching to Uloric. Dr. Sowles notified and advised to send in refill for patient as originally requested.  

## 2023-01-26 MED ORDER — ALLOPURINOL 300 MG PO TABS: 300.0000 mg | ORAL_TABLET | Freq: Every day | ORAL | 1 refills | Status: DC

## 2023-01-28 DIAGNOSIS — H6691 Otitis media, unspecified, right ear: Secondary | ICD-10-CM | POA: Diagnosis not present

## 2023-01-29 ENCOUNTER — Telehealth: Payer: Self-pay | Admitting: Family Medicine

## 2023-01-29 NOTE — Telephone Encounter (Signed)
FYI

## 2023-01-29 NOTE — Telephone Encounter (Signed)
Copied from CRM [id number]. Topic: General - Other >> Jan 29, 2023 12:54 PM [name] wrote: Reason for CRM: Pt stated she was prescribed a new medication but she does not want it. Pt reports that she will continue taking the allopurinol (ZYLOPRIM) 300 MG tablet

## 2023-01-30 ENCOUNTER — Other Ambulatory Visit: Payer: Self-pay | Admitting: Family Medicine

## 2023-01-30 NOTE — Telephone Encounter (Signed)
Pt notified- verbalized understanding.

## 2023-02-04 ENCOUNTER — Encounter: Payer: Self-pay | Admitting: Nurse Practitioner

## 2023-02-04 ENCOUNTER — Ambulatory Visit (INDEPENDENT_AMBULATORY_CARE_PROVIDER_SITE_OTHER): Payer: Medicare Other | Admitting: Nurse Practitioner

## 2023-02-04 ENCOUNTER — Other Ambulatory Visit: Payer: Self-pay

## 2023-02-04 VITALS — BP 130/78 | HR 78 | Temp 97.9°F | Resp 16 | Ht 63.5 in | Wt 176.2 lb

## 2023-02-04 DIAGNOSIS — H938X1 Other specified disorders of right ear: Secondary | ICD-10-CM

## 2023-02-04 NOTE — Progress Notes (Signed)
BP 130/78   Pulse 78   Temp 97.9 F (36.6 C) (Oral)   Resp 16   Ht 5' 3.5" (1.613 m)   Wt 176 lb 3.2 oz (79.9 kg)   SpO2 98%   BMI 30.72 kg/m    Subjective:    Patient ID: [name], female    DOB: June 10, 1941, 82 y.o.   MRN: [id number]  HPI: [name] is a 82 y.o. female  Chief Complaint  Patient presents with   Ear Pain    Seen at [facility]   Ear pain/fullness: she says she has had right ear pain since last Saturday.  She went to urgent care and diagnosed with an ear infection was put on clindamycin. She has a couple days left of antibiotic. She says she is no longer having pain but still feels like her ear feels full.  Upon inspection TM is clear with no signs of effusion or infection.  Recommend finish antibiotic, can use flonase and if no improvement will need to see ENT. Patient verbalized understanding.   Relevant past medical, surgical, family and social history reviewed and updated as indicated. Interim medical history since our last visit reviewed. Allergies and medications reviewed and updated.  Review of Systems  Constitutional: Negative for fever or weight change.  HEENT: positive for right ear fullness Respiratory: Negative for cough and shortness of breath.   Cardiovascular: Negative for chest pain or palpitations.  Gastrointestinal: Negative for abdominal pain, no bowel changes.  Musculoskeletal: Negative for gait problem or joint swelling.  Skin: Negative for rash.  Neurological: Negative for dizziness or headache.  No other specific complaints in a complete review of systems (except as listed in HPI above).      Objective:    BP 130/78   Pulse 78   Temp 97.9 F (36.6 C) (Oral)   Resp 16   Ht 5' 3.5" (1.613 m)   Wt 176 lb 3.2 oz (79.9 kg)   SpO2 98%   BMI 30.72 kg/m   Wt Readings from Last 3 Encounters:  02/04/23 176 lb 3.2 oz (79.9 kg)  11/10/22 176 lb 3.2 oz (79.9 kg)  07/07/22 176 lb 12.8 oz (80.2 kg)    Physical  Exam  Constitutional: Patient appears well-developed and well-nourished. Obese  No distress.  HEENT: head atraumatic, normocephalic, pupils equal and reactive to light, ears TMs clear, neck supple, throat within normal limits Cardiovascular: Normal rate, regular rhythm and normal heart sounds.  No murmur heard. No BLE edema. Pulmonary/Chest: Effort normal and breath sounds normal. No respiratory distress. Abdominal: Soft.  There is no tenderness. Psychiatric: Patient has a normal mood and affect. behavior is normal. Judgment and thought content normal.  Results for orders placed or performed in visit on 11/10/22  Lipid panel  Result Value Ref Range   Cholesterol 177 <200 mg/dL   HDL 45 (L) > OR = 50 mg/dL   Triglycerides 92 <191 mg/dL   LDL Cholesterol (Calc) 112 (H) mg/dL (calc)   Total CHOL/HDL Ratio 3.9 <5.0 (calc)   Non-HDL Cholesterol (Calc) 132 (H) <130 mg/dL (calc)  CBC with Differential/Platelet  Result Value Ref Range   WBC 4.4 3.8 - 10.8 Thousand/uL   RBC 4.10 3.80 - 5.10 Million/uL   Hemoglobin 12.1 11.7 - 15.5 g/dL   HCT 47.8 29.5 - 62.1 %   MCV 92.0 80.0 - 100.0 fL   MCH 29.5 27.0 - 33.0 pg   MCHC 32.1 32.0 - 36.0 g/dL   RDW  14.4 11.0 - 15.0 %   Platelets 255 140 - 400 Thousand/uL   MPV 11.8 7.5 - 12.5 fL   Neutro Abs 1,808 1,500 - 7,800 cells/uL   Lymphs Abs 1,945 850 - 3,900 cells/uL   Absolute Monocytes 506 200 - 950 cells/uL   Eosinophils Absolute 119 15 - 500 cells/uL   Basophils Absolute 22 0 - 200 cells/uL   Neutrophils Relative % 41.1 %   Total Lymphocyte 44.2 %   Monocytes Relative 11.5 %   Eosinophils Relative 2.7 %   Basophils Relative 0.5 %  COMPLETE METABOLIC PANEL WITH GFR  Result Value Ref Range   Glucose, Bld 98 65 - 99 mg/dL   BUN 20 7 - 25 mg/dL   Creat 8.41 3.24 - 4.01 mg/dL   eGFR 65 > OR = 60 UU/VOZ/3.66Y4   BUN/Creatinine Ratio SEE NOTE: 6 - 22 (calc)   Sodium 145 135 - 146 mmol/L   Potassium 3.5 3.5 - 5.3 mmol/L   Chloride 105 98 -  110 mmol/L   CO2 27 20 - 32 mmol/L   Calcium 9.2 8.6 - 10.4 mg/dL   Total Protein 6.6 6.1 - 8.1 g/dL   Albumin 4.2 3.6 - 5.1 g/dL   Globulin 2.4 1.9 - 3.7 g/dL (calc)   AG Ratio 1.8 1.0 - 2.5 (calc)   Total Bilirubin 0.5 0.2 - 1.2 mg/dL   Alkaline phosphatase (APISO) 61 37 - 153 U/L   AST 25 10 - 35 U/L   ALT 18 6 - 29 U/L  Hemoglobin A1c  Result Value Ref Range   Hgb A1c MFr Bld 5.9 (H) <5.7 % of total Hgb   Mean Plasma Glucose 123 mg/dL   eAG (mmol/L) 6.8 mmol/L  VITAMIN D 25 Hydroxy (Vit-D Deficiency, Fractures)  Result Value Ref Range   Vit D, 25-Hydroxy 56 30 - 100 ng/mL      Assessment & Plan:   Problem List Items Addressed This Visit   None Visit Diagnoses     Sensation of fullness in right ear    -  Primary   continue clindamycin, use flonase, if no improvement will refer to ent        Follow up plan: Return if symptoms worsen or fail to improve.

## 2023-02-12 DIAGNOSIS — I4891 Unspecified atrial fibrillation: Secondary | ICD-10-CM | POA: Diagnosis not present

## 2023-02-12 DIAGNOSIS — Z79899 Other long term (current) drug therapy: Secondary | ICD-10-CM | POA: Diagnosis not present

## 2023-02-12 DIAGNOSIS — Z7901 Long term (current) use of anticoagulants: Secondary | ICD-10-CM | POA: Diagnosis not present

## 2023-02-12 DIAGNOSIS — I5022 Chronic systolic (congestive) heart failure: Secondary | ICD-10-CM | POA: Diagnosis not present

## 2023-03-10 DIAGNOSIS — Z7901 Long term (current) use of anticoagulants: Secondary | ICD-10-CM | POA: Diagnosis not present

## 2023-03-13 DIAGNOSIS — M25512 Pain in left shoulder: Secondary | ICD-10-CM | POA: Diagnosis not present

## 2023-03-17 NOTE — Progress Notes (Unsigned)
Name: [name]   MRN: [id number]    DOB: 1941/08/01   Date:03/17/2023       Progress Note  Subjective  Chief Complaint  Follow Up  HPI  Chronic combined CHF/pulmonary hypertension : she is on Demadex daily , potassium and magnesium daily She has been off  digoxin but is taking metoprolol for rate control and is back on ARB  No chest , but has SOB  with moderate activity , she denies orthopnea, denies lower extremity edema. She still works at [employer] from 09/21  MILD SEGMENTAL LV SYSTOLIC DYSFUNCTION WITH AN ESTIMATED EF = 40-45 %  NORMAL RIGHT VENTRICULAR SYSTOLIC FUNCTION  MODERATE TRICUSPID AND MITRAL VALVE INSUFFICIENCY  TRACE AORTIC VALVE INSUFFICIENCY  NO VALVULAR STENOSIS  MILD RV ENLARGEMENT  MILD BIATRIAL ENLARGEMENT  MODERATE LVH  S/P MITRAL VALVE REPAIR  S/P AORTIC VALVE REPLACEMENT  AORTIC VALVE BIOPROSTHESIS APPEARS TO BE WELL-SEATED    HTN: taking metoprolol and  Losartan  , bp is well controlled  No chest pain she has mild SOB with moderate activity.    Hyperlipidemia: she stopped taking Atorvastatin but is taking Zetia, we sent rx of PCSK 9 but she states states she does not want injectables at this time  Last LDL not at goal, but improved, it used to be over 200 and it was down to 115. She  is now on Nexlizet and is tolerating it well, we will recheck labs tomorrow - she will return fasting due to elevated triglycerides    Insomnia: she is taking Trazodone and it helps her fall and stay asleep, sometimes she skips dose , she denies side effects of medication   Gout: last flare was on her last visit, taking allopurinol, doing well today   History of Paget's disease of left breast: she noticed a growth on left nipple Fall 2021 , she came in after a couple of months, was referred Dr. Lemar Livings had a biopsy followed by excisional biopsy and had 4 weeks of radiation with Dr. Aggie Cosier , completed treatment.    Dysmetabolic Syndrome:  She denies polyphagia,  or polydipsia Last hgbA1C was 5.7%, she is on a low carb diet, weight is stable  Polyuria is due to diuretics . Unchanged    Afib/history of aorta valve replacement in 2014 : rate controlled, no fluttering sensation, taking betablocker  also on coumadin and goes to coumadin clinic/Dr. Callwood, INR today was 2. Denies bleeding or bruising no blood in stools    Chronic bronchitis :  She states she no longer coughing in the mornings but wakes up feeling SOB and uses Spiriva prn only . She quit smoking in 2010.Stable    Depression: she has a long history of depression, used to take SSRI's, symptoms now are intermittent and has been in remission for months, sleep is controlled with Trazodone 50 mg at night . She denies suicidal thoughts or ideation. She is motivated, she is working at [employer] 5 days a week for 6 hours, she states she is feeling great    Hyperparathyroidism: she saw nephrologist and was referred to Endo, possible primary versus secondary hyperparathyroidism, she does not want to go back, last Pth was still elevated at 90  . She denies heartburn, bone pain, or GERD. She has intermittent muscle cramps We will recheck labs and if calcium elevated or pth higher she agreed on going back to see endo   Patient Active Problem List   Diagnosis Date Noted  Major depression in remission (HCC) 11/05/2021   Hyperparathyroidism (HCC) 11/05/2021   Tibialis posterior tendinitis 03/08/2020   On Coumadin for atrial fibrillation (HCC) 06/30/2018   Chronic anticoagulation 08/27/2017   History of Paget's disease of breast 08/27/2017   Chronic obstructive pulmonary disease (HCC) 07/04/2015   Controlled gout 06/06/2015   Anxiety 02/09/2015   Synovial cyst of popliteal space 02/09/2015   Benign hypertension 02/09/2015   Blood type A+ 02/09/2015   Arterial branch occlusion of retina 02/09/2015   Chronic constipation 02/09/2015   Insomnia, persistent 02/09/2015   Chronic combined systolic and  diastolic CHF, NYHA class 1 (HCC) 02/09/2015   Dyslipidemia 02/09/2015   Atrial fibrillation (HCC) 02/09/2015   Arthritis urica 02/09/2015   Hearing loss 02/09/2015   History of open heart surgery 02/09/2015   Chronic hoarseness 02/09/2015   Cardiomegaly 02/09/2015   Dysmetabolic syndrome 02/09/2015   Migraine with aura and without status migrainosus, not intractable 02/09/2015   Hypertensive pulmonary vascular disease (HCC) 02/09/2015   Allergic rhinitis, seasonal 02/09/2015   Vitamin D deficiency 02/09/2015   History of mitral valve repair 10/01/2012   History of aortic valve replacement 10/01/2012   Congestive heart failure (HCC) 10/01/2012    Past Surgical History:  Procedure Laterality Date   ABDOMINAL HYSTERECTOMY  1996   arm surgery  2019   BREAST BIOPSY Right 1998   neg   BREAST BIOPSY Right 2007   neg   BREAST BIOPSY Right 1992   positive   BREAST EXCISIONAL BIOPSY Left 2000   neg   BREAST LUMPECTOMY Right 1992   BREAST LUMPECTOMY Left 08/17/2020   Procedure: BREAST LUMPECTOMY;  Surgeon: Earline Mayotte, MD;  Location: [facility] ORS;  Service: General;  Laterality: Left;   CARDIAC VALVE REPLACEMENT  2014   CATARACT EXTRACTION W/PHACO Left 03/30/2018   Procedure: CATARACT EXTRACTION PHACO AND INTRAOCULAR LENS PLACEMENT (IOC);  Surgeon: Galen Manila, MD;  Location: [facility] ORS;  Service: Ophthalmology;  Laterality: Left;  [place] 00:31.9 AP% 12.1 CDE 3.87 Fluid Pack lot # [id number]   CESAREAN SECTION     FRACTURE SURGERY Left    ARM/ LEG   LEG SURGERY      Family History  Problem Relation Age of Onset   Cancer Mother    Diabetes Mother    Hypertension Mother    Breast cancer Mother 67   Hypertension Father    Heart failure Father    Cancer Brother        bladder   Cancer Sister        breast   Breast cancer Sister 59   Hypertension Son    Hypertension Daughter    Breast cancer Sister 77    Social History   Tobacco Use   Smoking status: Former     Types: Cigarettes    Quit date: 09/09/2007    Years since quitting: 15.5   Smokeless tobacco: Never  Substance Use Topics   Alcohol use: No    Alcohol/week: 0.0 standard drinks of alcohol     Current Outpatient Medications:    acetaminophen (TYLENOL) 500 MG tablet, Take 1,000 mg by mouth every 6 (six) hours as needed for mild pain or moderate pain. , Disp: , Rfl:    albuterol (VENTOLIN HFA) 108 (90 Base) MCG/ACT inhaler, Inhale 2 puffs into the lungs every 6 (six) hours as needed., Disp: , Rfl:    allopurinol (ZYLOPRIM) 300 MG tablet, Take 1 tablet (300 mg total) by mouth daily., Disp: 90 tablet, Rfl: 1  Bempedoic Acid-Ezetimibe (NEXLIZET) 180-10 MG TABS, Take 1 tablet by mouth daily at 12 noon., Disp: 90 tablet, Rfl: 1   Ergocalciferol (VITAMIN D2) 50 MCG (2000 UT) TABS, Take 2,000 Units by mouth daily., Disp: , Rfl:    lisinopril (ZESTRIL) 2.5 MG tablet, Take 2.5 mg by mouth daily., Disp: , Rfl:    losartan (COZAAR) 25 MG tablet, Take 25 mg by mouth daily., Disp: , Rfl:    metoprolol tartrate (LOPRESSOR) 25 MG tablet, Take 1 tablet (25 mg total) by mouth 2 (two) times daily., Disp: 180 tablet, Rfl: 1   MIEBO 1.338 GM/ML SOLN, Apply 1 drop to eye 4 (four) times daily., Disp: , Rfl:    Multiple Vitamins-Minerals (VITAMIN D3 COMPLETE PO), Take 1 tablet by mouth every other day., Disp: , Rfl:    potassium chloride SA (KLOR-CON M20) 20 MEQ tablet, Take 1 tablet (20 mEq total) by mouth 2 (two) times daily., Disp: 180 tablet, Rfl: 1   SPIRIVA HANDIHALER 18 MCG inhalation capsule, INHALE 1 CASPULE VIA HANDIHALER ONCE A DAY AT THE SAME TIME EVERY DAY, Disp: 30 capsule, Rfl: 1   spironolactone (ALDACTONE) 25 MG tablet, Take 12.5 mg by mouth daily., Disp: , Rfl:    torsemide (DEMADEX) 20 MG tablet, Take 1 tablet (20 mg total) by mouth daily., Disp: 90 tablet, Rfl: 1   traZODone (DESYREL) 50 MG tablet, Take 1 tablet (50 mg total) by mouth at bedtime as needed., Disp: 90 tablet, Rfl: 0   warfarin  (COUMADIN) 5 MG tablet, Take 5-7.5 mg by mouth See admin instructions. Take 5 mg on Mon., Wed., Sunday. All the other days take 7.5 mg, Disp: , Rfl:   Allergies  Allergen Reactions   Codeine Nausea And Vomiting and Nausea Only   Ace Inhibitors Cough and Other (See Comments)   Amoxicillin-Pot Clavulanate Nausea And Vomiting   Penicillins Nausea And Vomiting and Other (See Comments)    Has patient had a PCN reaction causing immediate rash, facial/tongue/throat swelling, SOB or lightheadedness with hypotension: No Has patient had a PCN reaction causing severe rash involving mucus membranes or skin necrosis: No Has patient had a PCN reaction that required hospitalization No Has patient had a PCN reaction occurring within the last 10 years: No If all of the above answers are "NO", then may proceed with Cephalosporin use.   Rosuvastatin Other (See Comments)    Reaction:  Joint pain    Sulfa Antibiotics Other (See Comments)   Tetanus-Diphtheria Toxoids Td Swelling    I personally reviewed active problem list, medication list, allergies, family history, social history, health maintenance with the patient/caregiver today.   ROS  ***  Objective  There were no vitals filed for this visit.  There is no height or weight on file to calculate BMI.  Physical Exam ***  No results found for this or any previous visit (from the past 2160 hour(s)).   PHQ2/9:    02/04/2023    2:19 PM 11/10/2022    3:39 PM 07/07/2022    2:54 PM 06/23/2022   11:44 AM 03/05/2022    3:08 PM  Depression screen PHQ 2/9  Decreased Interest 0 0 0 0 0  Down, Depressed, Hopeless 0 0 0 0 0  PHQ - 2 Score 0 0 0 0 0  Altered sleeping  0 0    Tired, decreased energy  0 0    Change in appetite  0 0    Feeling bad or failure about yourself   0  0    Trouble concentrating  0 0    Moving slowly or fidgety/restless  0 0    Suicidal thoughts  0 0    PHQ-9 Score  0 0      phq 9 is {gen pos [id number]}   Fall Risk:     02/04/2023    2:19 PM 11/10/2022    3:39 PM 07/07/2022    2:53 PM 06/23/2022   11:43 AM 03/05/2022    3:08 PM  Fall Risk   Falls in the past year? 0 0 0 0 0  Number falls in past yr: 0   0 0  Injury with Fall? 0   0 0  Risk for fall due to :  No Fall Risks No Fall Risks  No Fall Risks  Follow up  Falls prevention discussed;Education provided;Falls evaluation completed Falls prevention discussed;Education provided;Falls evaluation completed Falls evaluation completed Falls prevention discussed      Functional Status Survey:      Assessment & Plan  *** There are no diagnoses linked to this encounter.

## 2023-03-18 ENCOUNTER — Encounter: Payer: Self-pay | Admitting: Family Medicine

## 2023-03-18 ENCOUNTER — Ambulatory Visit (INDEPENDENT_AMBULATORY_CARE_PROVIDER_SITE_OTHER): Payer: Medicare Other | Admitting: Family Medicine

## 2023-03-18 VITALS — BP 128/74 | HR 83 | Temp 98.1°F | Resp 18 | Ht 64.0 in | Wt 177.8 lb

## 2023-03-18 DIAGNOSIS — E213 Hyperparathyroidism, unspecified: Secondary | ICD-10-CM | POA: Diagnosis not present

## 2023-03-18 DIAGNOSIS — I10 Essential (primary) hypertension: Secondary | ICD-10-CM

## 2023-03-18 DIAGNOSIS — I5042 Chronic combined systolic (congestive) and diastolic (congestive) heart failure: Secondary | ICD-10-CM

## 2023-03-18 DIAGNOSIS — I480 Paroxysmal atrial fibrillation: Secondary | ICD-10-CM

## 2023-03-18 DIAGNOSIS — I272 Pulmonary hypertension, unspecified: Secondary | ICD-10-CM | POA: Diagnosis not present

## 2023-03-18 DIAGNOSIS — Z853 Personal history of malignant neoplasm of breast: Secondary | ICD-10-CM | POA: Diagnosis not present

## 2023-03-18 DIAGNOSIS — F325 Major depressive disorder, single episode, in full remission: Secondary | ICD-10-CM

## 2023-03-18 DIAGNOSIS — N1831 Chronic kidney disease, stage 3a: Secondary | ICD-10-CM | POA: Insufficient documentation

## 2023-03-18 DIAGNOSIS — E785 Hyperlipidemia, unspecified: Secondary | ICD-10-CM

## 2023-03-18 DIAGNOSIS — J410 Simple chronic bronchitis: Secondary | ICD-10-CM | POA: Diagnosis not present

## 2023-03-18 DIAGNOSIS — E559 Vitamin D deficiency, unspecified: Secondary | ICD-10-CM | POA: Diagnosis not present

## 2023-03-18 MED ORDER — NEXLIZET 180-10 MG PO TABS: 1.0000 | ORAL_TABLET | Freq: Every day | ORAL | 1 refills | Status: DC

## 2023-03-26 ENCOUNTER — Other Ambulatory Visit: Payer: Self-pay | Admitting: Family Medicine

## 2023-03-26 DIAGNOSIS — I480 Paroxysmal atrial fibrillation: Secondary | ICD-10-CM

## 2023-04-13 DIAGNOSIS — Z7901 Long term (current) use of anticoagulants: Secondary | ICD-10-CM | POA: Diagnosis not present

## 2023-04-29 DIAGNOSIS — Z7901 Long term (current) use of anticoagulants: Secondary | ICD-10-CM | POA: Diagnosis not present

## 2023-05-04 DIAGNOSIS — R791 Abnormal coagulation profile: Secondary | ICD-10-CM | POA: Diagnosis not present

## 2023-05-06 DIAGNOSIS — R350 Frequency of micturition: Secondary | ICD-10-CM | POA: Diagnosis not present

## 2023-05-06 DIAGNOSIS — N390 Urinary tract infection, site not specified: Secondary | ICD-10-CM | POA: Diagnosis not present

## 2023-05-12 DIAGNOSIS — R791 Abnormal coagulation profile: Secondary | ICD-10-CM | POA: Diagnosis not present

## 2023-05-16 ENCOUNTER — Other Ambulatory Visit: Payer: Self-pay

## 2023-05-16 ENCOUNTER — Emergency Department: Payer: Medicare Other

## 2023-05-16 ENCOUNTER — Emergency Department
Admission: EM | Admit: 2023-05-16 | Discharge: 2023-05-16 | Disposition: A | Discharge status: Home / Self Care | Payer: Medicare Other | Attending: Emergency Medicine | Admitting: Emergency Medicine

## 2023-05-16 DIAGNOSIS — I509 Heart failure, unspecified: Secondary | ICD-10-CM | POA: Insufficient documentation

## 2023-05-16 DIAGNOSIS — K5730 Diverticulosis of large intestine without perforation or abscess without bleeding: Secondary | ICD-10-CM | POA: Diagnosis not present

## 2023-05-16 DIAGNOSIS — R109 Unspecified abdominal pain: Secondary | ICD-10-CM | POA: Diagnosis not present

## 2023-05-16 DIAGNOSIS — D72829 Elevated white blood cell count, unspecified: Secondary | ICD-10-CM | POA: Insufficient documentation

## 2023-05-16 DIAGNOSIS — Z7901 Long term (current) use of anticoagulants: Secondary | ICD-10-CM | POA: Insufficient documentation

## 2023-05-16 DIAGNOSIS — I10 Essential (primary) hypertension: Secondary | ICD-10-CM | POA: Diagnosis not present

## 2023-05-16 DIAGNOSIS — N280 Ischemia and infarction of kidney: Secondary | ICD-10-CM

## 2023-05-16 DIAGNOSIS — I110 Hypertensive heart disease with heart failure: Secondary | ICD-10-CM | POA: Insufficient documentation

## 2023-05-16 DIAGNOSIS — N390 Urinary tract infection, site not specified: Secondary | ICD-10-CM | POA: Insufficient documentation

## 2023-05-16 DIAGNOSIS — J449 Chronic obstructive pulmonary disease, unspecified: Secondary | ICD-10-CM | POA: Insufficient documentation

## 2023-05-16 DIAGNOSIS — K8020 Calculus of gallbladder without cholecystitis without obstruction: Secondary | ICD-10-CM | POA: Diagnosis not present

## 2023-05-16 LAB — URINALYSIS, ROUTINE W REFLEX MICROSCOPIC
Bilirubin Urine: NEGATIVE
Glucose, UA: NEGATIVE mg/dL
Ketones, ur: NEGATIVE mg/dL
Nitrite: NEGATIVE
Protein, ur: NEGATIVE mg/dL
Specific Gravity, Urine: 1.014 (ref 1.005–1.030)
pH: 5 (ref 5.0–8.0)

## 2023-05-16 LAB — CBC WITH DIFFERENTIAL/PLATELET
Abs Immature Granulocytes: 0.05 10*3/uL (ref 0.00–0.07)
Basophils Absolute: 0 10*3/uL (ref 0.0–0.1)
Basophils Relative: 0 %
Eosinophils Absolute: 0.1 10*3/uL (ref 0.0–0.5)
Eosinophils Relative: 0 %
HCT: 38.8 % (ref 36.0–46.0)
Hemoglobin: 13 g/dL (ref 12.0–15.0)
Immature Granulocytes: 0 %
Lymphocytes Relative: 18 %
Lymphs Abs: 2.5 10*3/uL (ref 0.7–4.0)
MCH: 30.7 pg (ref 26.0–34.0)
MCHC: 33.5 g/dL (ref 30.0–36.0)
MCV: 91.7 fL (ref 80.0–100.0)
Monocytes Absolute: 1.4 10*3/uL — ABNORMAL HIGH (ref 0.1–1.0)
Monocytes Relative: 10 %
Neutro Abs: 10 10*3/uL — ABNORMAL HIGH (ref 1.7–7.7)
Neutrophils Relative %: 72 %
Platelets: 218 10*3/uL (ref 150–400)
RBC: 4.23 MIL/uL (ref 3.87–5.11)
RDW: 14.5 % (ref 11.5–15.5)
WBC: 14 10*3/uL — ABNORMAL HIGH (ref 4.0–10.5)
nRBC: 0 % (ref 0.0–0.2)

## 2023-05-16 LAB — COMPREHENSIVE METABOLIC PANEL
ALT: 42 U/L (ref 0–44)
AST: 68 U/L — ABNORMAL HIGH (ref 15–41)
Albumin: 4.3 g/dL (ref 3.5–5.0)
Alkaline Phosphatase: 58 U/L (ref 38–126)
Anion gap: 13 (ref 5–15)
BUN: 21 mg/dL (ref 8–23)
CO2: 27 mmol/L (ref 22–32)
Calcium: 9.2 mg/dL (ref 8.9–10.3)
Chloride: 99 mmol/L (ref 98–111)
Creatinine, Ser: 1.09 mg/dL — ABNORMAL HIGH (ref 0.44–1.00)
GFR, Estimated: 51 mL/min — ABNORMAL LOW (ref >60)
Glucose, Bld: 119 mg/dL — ABNORMAL HIGH (ref 70–99)
Potassium: 3.1 mmol/L — ABNORMAL LOW (ref 3.5–5.1)
Sodium: 139 mmol/L (ref 135–145)
Total Bilirubin: 1.2 mg/dL (ref 0.3–1.2)
Total Protein: 7.7 g/dL (ref 6.5–8.1)

## 2023-05-16 LAB — LIPASE, BLOOD: Lipase: 27 U/L (ref 11–51)

## 2023-05-16 MED ORDER — CYCLOBENZAPRINE HCL 5 MG PO TABS: 5.0000 mg | ORAL_TABLET | Freq: Three times a day (TID) | ORAL | 0 refills | Status: AC | PRN

## 2023-05-16 MED ORDER — IOHEXOL 300 MG/ML  SOLN
100.0000 mL | Freq: Once | INTRAMUSCULAR | Status: AC | PRN
  Administered 2023-05-16: 100 mL via INTRAVENOUS

## 2023-05-16 MED ORDER — CYCLOBENZAPRINE HCL 10 MG PO TABS
5.0000 mg | ORAL_TABLET | Freq: Once | ORAL | Status: AC
  Administered 2023-05-16: 5 mg via ORAL
  Filled 2023-05-16: qty 1

## 2023-05-16 MED ORDER — CEPHALEXIN 500 MG PO CAPS: 1000.0000 mg | ORAL_CAPSULE | Freq: Two times a day (BID) | ORAL | 0 refills | Status: AC

## 2023-05-16 NOTE — ED Provider Notes (Signed)
[facility] Provider Note   Event Date/Time   First MD Initiated Contact with Patient 05/16/23 1544     (approximate) History  Abdominal Pain (RIGHT sided abdominal pain that began Thursday night; Is nauseated but has not vomited, denies diarrhea)  HPI [name] is a 82 y.o. female with a stated past medical history of CHF, hypertension, and atrial fibrillation who presents complaining of abdominal pain.  Patient states that this pain originally started as gas pain that was generalized and resolved after a bowel movement.  Patient now states that she is having right flank pain that is mild but persistent.  Patient denies any continued abdominal pain does endorse mild nausea without vomiting. ROS: Patient currently denies any vision changes, tinnitus, difficulty speaking, facial droop, sore throat, chest pain, shortness of breath, abdominal pain, vomiting/diarrhea, dysuria, or weakness/numbness/paresthesias in any extremity   Physical Exam  Triage Vital Signs: ED Triage Vitals  Encounter Vitals Group     BP 05/16/23 1540 (!) 153/68     Systolic BP Percentile --      Diastolic BP Percentile --      Pulse Rate 05/16/23 1540 88     Resp 05/16/23 1540 19     Temp 05/16/23 1540 97.8 F (36.6 C)     Temp Source 05/16/23 1540 Oral     SpO2 05/16/23 1540 96 %     Weight 05/16/23 1541 177 lb 11.1 oz (80.6 kg)     Height 05/16/23 1541 5\' 4"  (1.626 m)     Head Circumference --      Peak Flow --      Pain Score 05/16/23 1541 2     Pain Loc --      Pain Education --      Exclude from Growth Chart --    Most recent vital signs: Vitals:   05/16/23 1540 05/16/23 1900  BP: (!) 153/68 (!) 188/104  Pulse: 88 75  Resp: 19 18  Temp: 97.8 F (36.6 C)   SpO2: 96% 96%   General: Awake, oriented x4. CV:  Good peripheral perfusion.  Resp:  Normal effort.  Abd:  No distention.  Mild left flank tenderness to palpation Other:  Elderly overweight African-American  female resting comfortably in no acute distress ED Results / Procedures / Treatments  Labs (all labs ordered are listed, but only abnormal results are displayed) Labs Reviewed  COMPREHENSIVE METABOLIC PANEL - Abnormal; Notable for the following components:      Result Value   Potassium 3.1 (*)    Glucose, Bld 119 (*)    Creatinine, Ser 1.09 (*)    AST 68 (*)    GFR, Estimated 51 (*)    All other components within normal limits  CBC WITH DIFFERENTIAL/PLATELET - Abnormal; Notable for the following components:   WBC 14.0 (*)    Neutro Abs 10.0 (*)    Monocytes Absolute 1.4 (*)    All other components within normal limits  URINALYSIS, ROUTINE W REFLEX MICROSCOPIC - Abnormal; Notable for the following components:   Color, Urine YELLOW (*)    APPearance CLEAR (*)    Hgb urine dipstick SMALL (*)    Leukocytes,Ua TRACE (*)    Bacteria, UA RARE (*)    All other components within normal limits  LIPASE, BLOOD   RADIOLOGY ED MD interpretation: CT of the abdomen pelvis with IV contrast interpreted independently by me and shows findings consistent with pyelonephritis versus probable developing infarct in the  interpolar right kidney with no drainable fluid collection or abscess at this time. -Agree with radiology assessment Official radiology report(s): CT ABDOMEN PELVIS W CONTRAST  Result Date: 05/16/2023 CLINICAL DATA:  Right-sided abdominal pain.  Nausea. EXAM: CT ABDOMEN AND PELVIS WITH CONTRAST TECHNIQUE: Multidetector CT imaging of the abdomen and pelvis was performed using the standard protocol following bolus administration of intravenous contrast. RADIATION DOSE REDUCTION: This exam was performed according to the departmental dose-optimization program which includes automated exposure control, adjustment of the mA and/or kV according to patient size and/or use of iterative reconstruction technique. CONTRAST:  OMNIPAQUE IOHEXOL 300 MG/ML  SOLN COMPARISON:  Renal ultrasound dated  08/06/2020. FINDINGS: Lower chest: The visualized lung bases are clear. Status post prior mitral and aortic valve repair. There is mild cardiomegaly. No intra-abdominal free air or free fluid. Hepatobiliary: Small cyst in the left lobe of the liver centrally. The liver is otherwise unremarkable. No biliary ductal dilatation. Several stones in the neck of the gallbladder. No pericholecystic fluid or evidence of acute cholecystitis by CT. Pancreas: Unremarkable. No pancreatic ductal dilatation or surrounding inflammatory changes. Spleen: Normal in size without focal abnormality. Adrenals/Urinary Tract: The adrenal glands are unremarkable. A vascular calcification versus a 3 mm nonobstructing right renal interpolar calculus. There is large area of hypoenhancement involving the interpolar right kidney which may represent pyelonephritis or developing infarct. Correlation with urinalysis recommended. No drainable fluid collection/abscess. The left kidney is unremarkable. The visualized ureters and urinary bladder appear unremarkable. Stomach/Bowel: There is mild sigmoid diverticulosis without active inflammatory changes. There is no bowel obstruction or active inflammation. The appendix is normal. Vascular/Lymphatic: Moderate aortoiliac atherosclerotic disease. The IVC is unremarkable. No portal venous gas. There is no adenopathy. Reproductive: Hysterectomy.  No adnexal masses. Other: None Musculoskeletal: Osteopenia with degenerative changes of the spine. No acute osseous pathology. IMPRESSION: 1. Findings most consistent with pyelonephritis with probable developing infarct in the interpolar right kidney. No drainable fluid collection/abscess at this time. 2. Cholelithiasis. 3. Mild sigmoid diverticulosis. No bowel obstruction. Normal appendix. 4.  Aortic Atherosclerosis (ICD10-I70.0). Electronically Signed   By: Elgie Collard M.D.   On: 05/16/2023 18:42   PROCEDURES: Critical Care performed: No .1-3 Lead EKG  Interpretation  Performed by: Merwyn Katos, MD Authorized by: Merwyn Katos, MD     Interpretation: normal     ECG rate:  71   ECG rate assessment: normal     Rhythm: sinus rhythm     Ectopy: none     Conduction: normal    MEDICATIONS ORDERED IN ED: Medications  cyclobenzaprine (FLEXERIL) tablet 5 mg (5 mg Oral Given 05/16/23 1653)  iohexol (OMNIPAQUE) 300 MG/ML solution 100 mL (100 mLs Intravenous Contrast Given 05/16/23 1723)   IMPRESSION / MDM / ASSESSMENT AND PLAN / ED COURSE  I reviewed the triage vital signs and the nursing notes.                             The patient is on the cardiac monitor to evaluate for evidence of arrhythmia and/or significant heart rate changes. Patient's presentation is most consistent with acute presentation with potential threat to life or bodily function.  This patient presents to the ED for concern of left flank pain, this involves an extensive number of treatment options, and is a complaint that carries with it a high risk of complications and morbidity.  The differential diagnosis includes nephrolithiasis, pyelonephritis, appendicitis, diverticulitis Co morbidities that  complicate the patient evaluation  Warfarin use, CHF, atrial fibrillation, hypertension, COPD Additional history obtained:  External records from outside source obtained and reviewed including office visit on 03/18/2023 with her primary care physician Lab Tests:  I Ordered, and personally interpreted labs.  The pertinent results include: Leukocytosis to 14, potassium 3.1, and urinalysis showing trace leukocytes and rare bacteria Imaging Studies ordered:  I ordered imaging studies including CT of the abdomen and pelvis  I independently visualized and interpreted imaging which showed findings consistent with pyelonephritis versus renal infarct.  Given patient has no evidence of bladder inflammation on CT as well as only trace leukocytes and rare bacteria on her UA, I imagine  this is a small renal infarct.  Patient is on warfarin and states that she was subtherapeutic recently with her INR at 1.2.  Patient denies any shortness of breath, unilateral lower extremity swelling  I agree with the radiologist interpretation Cardiac Monitoring: / EKG:  The patient was maintained on a cardiac monitor.  I personally viewed and interpreted the cardiac monitored which showed an underlying rhythm of: Normal sinus rhythm Problem List / ED Course / Critical interventions / Medication management  Right renal infarct   I have reviewed the patients home medicines and have made adjustments as needed Test / Admission - Considered:  I spoke with patient at length about the possibility of admission given a new renal infarct however patient states that she has excellent follow-up with her primary care provider as well as a caregiver that can stay with her Dispo: Discharge home with PCP follow-up for recheck of INR and kidney function       FINAL CLINICAL IMPRESSION(S) / ED DIAGNOSES   Final diagnoses:  Renal infarct (HCC)  Urinary tract infection without hematuria, site unspecified   Rx / DC Orders   ED Discharge Orders     None      Note:  This document was prepared using Dragon voice recognition software and may include unintentional dictation errors.   Merwyn Katos, MD 05/16/23 [phone]

## 2023-05-16 NOTE — ED Triage Notes (Signed)
RIGHT sided abdominal pain that began Thursday night; Is nauseated but has not vomited, denies diarrhea

## 2023-05-18 ENCOUNTER — Ambulatory Visit (INDEPENDENT_AMBULATORY_CARE_PROVIDER_SITE_OTHER): Payer: Medicare Other | Admitting: Internal Medicine

## 2023-05-18 ENCOUNTER — Encounter: Payer: Self-pay | Admitting: Internal Medicine

## 2023-05-18 VITALS — BP 136/82 | HR 77 | Temp 97.8°F | Resp 18 | Ht 64.0 in | Wt 174.8 lb

## 2023-05-18 DIAGNOSIS — N12 Tubulo-interstitial nephritis, not specified as acute or chronic: Secondary | ICD-10-CM | POA: Diagnosis not present

## 2023-05-18 DIAGNOSIS — N280 Ischemia and infarction of kidney: Secondary | ICD-10-CM | POA: Diagnosis not present

## 2023-05-18 DIAGNOSIS — R110 Nausea: Secondary | ICD-10-CM | POA: Diagnosis not present

## 2023-05-18 LAB — POCT URINALYSIS DIPSTICK
Bilirubin, UA: NEGATIVE
Glucose, UA: NEGATIVE
Ketones, UA: NEGATIVE
Leukocytes, UA: NEGATIVE
Nitrite, UA: NEGATIVE
Odor: NORMAL
Protein, UA: POSITIVE — AB
Spec Grav, UA: 1.015 (ref 1.010–1.025)
Urobilinogen, UA: 0.2 U/dL
pH, UA: 6 (ref 5.0–8.0)

## 2023-05-18 MED ORDER — ONDANSETRON HCL 4 MG PO TABS: 4.0000 mg | ORAL_TABLET | Freq: Three times a day (TID) | ORAL | 0 refills | Status: DC | PRN

## 2023-05-18 NOTE — Progress Notes (Signed)
Acute Office Visit  Subjective:     Patient ID: [name], female    DOB: 05/27/1941, 82 y.o.   MRN: [id number]  Chief Complaint  Patient presents with   Follow-up    HPI Patient is in today for ED follow up.  Discharge Date: 05/16/23 Diagnosis: pyelonephritis, renal infarct Procedures/tests: Urine with bacteria and trace leukocytes, CT A/P consistent with pyelonephritis with probable developing infarct in the interpolar right kidney, EKG normal sinus rhythm, potassium 3.1, WBC 14.0 Consultants: None New medications: Keflex 500 mg 4x daily for 7 days - taking for 1 day so far Discontinued medications: None Status: better  Patient presented to the ER after having right flank pain. She is now on antibiotics and symptoms improved. No dysuria, flank pain, abdominal pain, fevers. Currently on Warfarin 5 mg every day (managed by Dr. Juliann Pares). Last INR last week 2.2.   Review of Systems  Constitutional:  Negative for chills and fever.  Respiratory:  Negative for sputum production.   Cardiovascular:  Negative for chest pain.  Gastrointestinal:  Positive for nausea.  Genitourinary:  Negative for dysuria, flank pain, frequency, hematuria and urgency.        Objective:    BP 136/82   Pulse 77   Temp 97.8 F (36.6 C)   Resp 18   Ht 5\' 4"  (1.626 m)   Wt 174 lb 12.8 oz (79.3 kg)   SpO2 97%   BMI 30.00 kg/m  BP Readings from Last 3 Encounters:  05/18/23 136/82  05/16/23 (!) 188/104  03/18/23 128/74   Wt Readings from Last 3 Encounters:  05/18/23 174 lb 12.8 oz (79.3 kg)  05/16/23 177 lb 11.1 oz (80.6 kg)  03/18/23 177 lb 12.8 oz (80.6 kg)      Physical Exam Constitutional:      Appearance: Normal appearance.  HENT:     Head: Normocephalic and atraumatic.  Eyes:     Conjunctiva/sclera: Conjunctivae normal.  Cardiovascular:     Rate and Rhythm: Normal rate and regular rhythm.  Pulmonary:     Effort: Pulmonary effort is normal.     Breath sounds: Normal  breath sounds.  Abdominal:     General: There is no distension.     Palpations: Abdomen is soft.     Tenderness: There is no abdominal tenderness. There is no right CVA tenderness, left CVA tenderness, guarding or rebound.  Skin:    General: Skin is warm and dry.  Neurological:     General: No focal deficit present.     Mental Status: She is alert. Mental status is at baseline.  Psychiatric:        Mood and Affect: Mood normal.        Behavior: Behavior normal.     Results for orders placed or performed in visit on 05/18/23  POCT Urinalysis Dipstick  Result Value Ref Range   Color, UA yellow    Clarity, UA clear    Glucose, UA Negative Negative   Bilirubin, UA neg    Ketones, UA neg    Spec Grav, UA 1.015 1.010 - 1.025   Blood, UA trace    pH, UA 6.0 5.0 - 8.0   Protein, UA Positive (A) Negative   Urobilinogen, UA 0.2 0.2 or 1.0 E.U./dL   Nitrite, UA neg    Leukocytes, UA Negative Negative   Appearance clear    Odor normal         Assessment & Plan:   1. Pyelonephritis/Nausea: Symptoms  improved, urine normal here today. Patient will continue Keflex as prescribed. She is having some nausea, will prescribe Zofran. Needing a work note. Potassium slightly low in the ER, patient only taking a half dose of potassium supplements, will increase to 20 meQ BID. She will return in 2 weeks to recheck BMP and INR.  - POCT Urinalysis Dipstick - Basic Metabolic Panel (BMET); Future - ondansetron (ZOFRAN) 4 MG tablet; Take 1 tablet (4 mg total) by mouth every 8 (eight) hours as needed for nausea or vomiting.  Dispense: 20 tablet; Refill: 0  2. Renal infarct [facility]): Currently on Warfarin 5 mg daily, last INR 2.2, managed by Cardiology. Will recheck INR in 2 weeks.   - INR/PT; Future   Return for already scheduled .  Margarita Mail, DO

## 2023-05-18 NOTE — Patient Instructions (Signed)
It was great seeing you today!  Plan discussed at today's visit: -Urine already looking better but continue antibiotic as prescribed  -Zofran prescribed to take as needed for nausea -Please return in 2 weeks to recheck labs  Follow up in: December, already scheduled or sooner as needed  Take care and let us know if you have any questions or concerns prior to your next visit.  Dr. Caralee Ates

## 2023-05-18 NOTE — Group Note (Deleted)

## 2023-05-31 DIAGNOSIS — Z20822 Contact with and (suspected) exposure to covid-19: Secondary | ICD-10-CM | POA: Diagnosis not present

## 2023-05-31 DIAGNOSIS — J069 Acute upper respiratory infection, unspecified: Secondary | ICD-10-CM | POA: Diagnosis not present

## 2023-06-04 DIAGNOSIS — I5022 Chronic systolic (congestive) heart failure: Secondary | ICD-10-CM | POA: Diagnosis not present

## 2023-06-04 DIAGNOSIS — Z9889 Other specified postprocedural states: Secondary | ICD-10-CM | POA: Diagnosis not present

## 2023-06-04 DIAGNOSIS — J42 Unspecified chronic bronchitis: Secondary | ICD-10-CM | POA: Diagnosis not present

## 2023-06-04 DIAGNOSIS — Q231 Congenital insufficiency of aortic valve: Secondary | ICD-10-CM | POA: Diagnosis not present

## 2023-06-04 DIAGNOSIS — Z952 Presence of prosthetic heart valve: Secondary | ICD-10-CM | POA: Diagnosis not present

## 2023-06-04 DIAGNOSIS — Z7901 Long term (current) use of anticoagulants: Secondary | ICD-10-CM | POA: Diagnosis not present

## 2023-06-04 DIAGNOSIS — I4891 Unspecified atrial fibrillation: Secondary | ICD-10-CM | POA: Diagnosis not present

## 2023-06-04 DIAGNOSIS — I351 Nonrheumatic aortic (valve) insufficiency: Secondary | ICD-10-CM | POA: Diagnosis not present

## 2023-06-04 DIAGNOSIS — R0602 Shortness of breath: Secondary | ICD-10-CM | POA: Diagnosis not present

## 2023-06-04 DIAGNOSIS — I10 Essential (primary) hypertension: Secondary | ICD-10-CM | POA: Diagnosis not present

## 2023-06-11 ENCOUNTER — Telehealth: Payer: Self-pay

## 2023-06-11 NOTE — Telephone Encounter (Signed)
Transition Care Management Unsuccessful Follow-up Telephone Call  Date of discharge and from where:  05/16/2023 [facility]  Attempts:  1st Attempt  Reason for unsuccessful TCM follow-up call:  No answer/busy  Betsi Crespi [facility]  [facility], [facility] Guide Direct Dial: [phone]  Website: [email or web address]

## 2023-06-12 ENCOUNTER — Telehealth: Payer: Self-pay

## 2023-06-12 NOTE — Telephone Encounter (Signed)
Transition Care Management Unsuccessful Follow-up Telephone Call  Date of discharge and from where:  05/16/2023 [facility]  Attempts:  2nd Attempt  Reason for unsuccessful TCM follow-up call:  Left voice message  Shiron Whetsel [facility]  [facility] Resource Care Guide Direct Dial: [phone]  Website: [email or web address]

## 2023-06-29 DIAGNOSIS — Z7901 Long term (current) use of anticoagulants: Secondary | ICD-10-CM | POA: Diagnosis not present

## 2023-06-29 DIAGNOSIS — R0602 Shortness of breath: Secondary | ICD-10-CM | POA: Diagnosis not present

## 2023-06-30 ENCOUNTER — Ambulatory Visit (INDEPENDENT_AMBULATORY_CARE_PROVIDER_SITE_OTHER): Payer: Medicare Other

## 2023-06-30 DIAGNOSIS — Z23 Encounter for immunization: Secondary | ICD-10-CM | POA: Diagnosis not present

## 2023-06-30 NOTE — Progress Notes (Signed)
Patient presented in expressed good health for influenza vaccine. Patient tolerated vaccine well with no adverse effects noted at time of clinic departure.

## 2023-07-09 ENCOUNTER — Other Ambulatory Visit: Payer: Self-pay | Admitting: Surgery

## 2023-07-09 DIAGNOSIS — Z1231 Encounter for screening mammogram for malignant neoplasm of breast: Secondary | ICD-10-CM

## 2023-07-15 ENCOUNTER — Other Ambulatory Visit: Payer: Self-pay | Admitting: Family Medicine

## 2023-07-28 DIAGNOSIS — Z7901 Long term (current) use of anticoagulants: Secondary | ICD-10-CM | POA: Diagnosis not present

## 2023-08-02 ENCOUNTER — Other Ambulatory Visit: Payer: Self-pay | Admitting: Family Medicine

## 2023-08-02 DIAGNOSIS — J410 Simple chronic bronchitis: Secondary | ICD-10-CM

## 2023-08-03 ENCOUNTER — Other Ambulatory Visit: Payer: Self-pay

## 2023-08-03 DIAGNOSIS — J410 Simple chronic bronchitis: Secondary | ICD-10-CM

## 2023-08-10 NOTE — Progress Notes (Signed)
Name: [name]   MRN: [id number]    DOB: July 26, 1941   Date:08/18/2023       Progress Note  Subjective  Chief Complaint  Chief Complaint  Patient presents with   Medical Management of Chronic Issues    HPI  Discussed the use of AI scribe software for clinical note transcription with the patient, who gave verbal consent to proceed.  History of Present Illness   The patient, with a history of congestive heart failure, pulmonary hypertension, and aortic valve replacement, presented for a routine follow-up after approximately five months. The patient reported occasional fleeting chest discomfort, described as a 'zip' sensation, but did not believe it to be cardiac in origin. She also reported shortness of breath with moderate activity, such as fast-paced walking, but not with lighter activities like showering. The patient sleeps with multiple pillows due to orthopnea that has been stable   The patient's medications include metoprolol, torsemide, and spironolactone, which she believes are managing her fluid balance effectively. She reported no recent leg swelling. The patient also takes Nexlizet for cholesterol management, but admitted to a lapse in medication adherence due to running out of the medication.  The patient has a history of major depression, which she reported as currently being intermittent and manageable without medication. She takes trazodone, which she finds helpful for sleep and calming her nerves.  The patient has a history of chronic bronchitis but reported no current cough or wheezing. She has a history of gout but has not had a recent flare. She also has a history of Paget's disease of the left breast, for which she underwent surgery and radiation therapy.  The patient has a history of chronic kidney disease, which was noted to have worsened slightly in a previous visit. She reported no symptoms of kidney dysfunction. She also has a history of prediabetes, with her last  recorded HbA1c being borderline at 5.9.  The patient had a renal infarct noted on a CT scan during an emergency room visit for pyelonephritis in September. She has not followed up with a nephrologist since then. The patient also has a history of hyperparathyroidism, for which she has not seen endocrinologist lately   The patient has a history of atrial fibrillation and underwent aortic valve replacement in 2014. She reported occasional fluttering sensations, which she believes may be related to caffeine intake. She is on Coumadin for anticoagulation, which is monitored at an anticoagulation clinic. She reported no bleeding or bruising issues.         Patient Active Problem List   Diagnosis Date Noted   Chronic bronchitis (HCC) 08/18/2023   Chronic kidney disease, stage 3a (HCC) 03/18/2023   Major depression in remission (HCC) 11/05/2021   Hyperparathyroidism (HCC) 11/05/2021   Tibialis posterior tendinitis 03/08/2020   On Coumadin for atrial fibrillation (HCC) 06/30/2018   Chronic anticoagulation 08/27/2017   History of Paget's disease of breast 08/27/2017   Presence of prosthetic heart valve 08/04/2017   Controlled gout 06/06/2015   Synovial cyst of popliteal space 02/09/2015   Benign hypertension 02/09/2015   Blood type A+ 02/09/2015   Arterial branch occlusion of retina 02/09/2015   Chronic constipation 02/09/2015   Insomnia, persistent 02/09/2015   Chronic combined systolic and diastolic CHF, NYHA class 1 (HCC) 02/09/2015   Dyslipidemia 02/09/2015   Atrial fibrillation (HCC) 02/09/2015   Hearing loss 02/09/2015   History of open heart surgery 02/09/2015   Chronic hoarseness 02/09/2015   Cardiomegaly 02/09/2015   Dysmetabolic syndrome  02/09/2015   Migraine with aura and without status migrainosus, not intractable 02/09/2015   Hypertensive pulmonary vascular disease (HCC) 02/09/2015   Allergic rhinitis, seasonal 02/09/2015   Vitamin D deficiency 02/09/2015   History of mitral  valve repair 10/01/2012   History of aortic valve replacement 10/01/2012    Past Surgical History:  Procedure Laterality Date   ABDOMINAL HYSTERECTOMY  1996   arm surgery  2019   BREAST BIOPSY Right 1998   neg   BREAST BIOPSY Right 2007   neg   BREAST BIOPSY Right 1992   positive   BREAST EXCISIONAL BIOPSY Left 2000   neg   BREAST LUMPECTOMY Right 1992   BREAST LUMPECTOMY Left 08/17/2020   Procedure: BREAST LUMPECTOMY;  Surgeon: Earline Mayotte, MD;  Location: [facility] ORS;  Service: General;  Laterality: Left;   CARDIAC VALVE REPLACEMENT  2014   CATARACT EXTRACTION W/PHACO Left 03/30/2018   Procedure: CATARACT EXTRACTION PHACO AND INTRAOCULAR LENS PLACEMENT (IOC);  Surgeon: Galen Manila, MD;  Location: [facility] ORS;  Service: Ophthalmology;  Laterality: Left;  [place] 00:31.9 AP% 12.1 CDE 3.87 Fluid Pack lot # [id number]   CESAREAN SECTION     FRACTURE SURGERY Left    ARM/ LEG   LEG SURGERY      Family History  Problem Relation Age of Onset   Cancer Mother    Diabetes Mother    Hypertension Mother    Breast cancer Mother 36   Hypertension Father    Heart failure Father    Cancer Brother        bladder   Cancer Sister        breast   Breast cancer Sister 63   Hypertension Son    Hypertension Daughter    Breast cancer Sister 95    Social History   Tobacco Use   Smoking status: Former    Current packs/day: 0.00    Types: Cigarettes    Quit date: 09/09/2007    Years since quitting: 15.9   Smokeless tobacco: Never  Substance Use Topics   Alcohol use: No    Alcohol/week: 0.0 standard drinks of alcohol     Current Outpatient Medications:    acetaminophen (TYLENOL) 500 MG tablet, Take 1,000 mg by mouth every 6 (six) hours as needed for mild pain or moderate pain. , Disp: , Rfl:    albuterol (VENTOLIN HFA) 108 (90 Base) MCG/ACT inhaler, Inhale 2 puffs into the lungs every 6 (six) hours as needed., Disp: , Rfl:    allopurinol (ZYLOPRIM) 300 MG tablet, TAKE 1 TABLET BY  MOUTH EVERY DAY, Disp: 90 tablet, Rfl: 1   Ergocalciferol (VITAMIN D2) 50 MCG (2000 UT) TABS, Take 2,000 Units by mouth daily., Disp: , Rfl:    KLOR-CON M20 20 MEQ tablet, TAKE 1 TABLET BY MOUTH TWICE A DAY, Disp: 180 tablet, Rfl: 1   MIEBO 1.338 GM/ML SOLN, Apply 1 drop to eye 4 (four) times daily., Disp: , Rfl:    Multiple Vitamins-Minerals (VITAMIN D3 COMPLETE PO), Take 1 tablet by mouth every other day., Disp: , Rfl:    spironolactone (ALDACTONE) 25 MG tablet, Take 12.5 mg by mouth daily., Disp: , Rfl:    tiotropium (SPIRIVA) 18 MCG inhalation capsule, INHALE 1 CASPULE VIA HANDIHALER ONCE A DAY AT THE SAME TIME EVERY DAY, Disp: 30 capsule, Rfl: 1   warfarin (COUMADIN) 5 MG tablet, Take 5 mg by mouth daily at 12 noon., Disp: , Rfl:    Bempedoic Acid-Ezetimibe (NEXLIZET) 180-10 MG TABS,  Take 1 tablet by mouth daily at 12 noon., Disp: 90 tablet, Rfl: 1   metoprolol tartrate (LOPRESSOR) 25 MG tablet, Take 1 tablet (25 mg total) by mouth 2 (two) times daily., Disp: 180 tablet, Rfl: 1   torsemide (DEMADEX) 20 MG tablet, Take 1 tablet (20 mg total) by mouth daily., Disp: 90 tablet, Rfl: 1   traZODone (DESYREL) 50 MG tablet, Take 1 tablet (50 mg total) by mouth at bedtime as needed., Disp: 90 tablet, Rfl: 0  Allergies  Allergen Reactions   Codeine Nausea And Vomiting and Nausea Only   Ace Inhibitors Cough and Other (See Comments)   Amoxicillin-Pot Clavulanate Nausea And Vomiting   Penicillins Nausea And Vomiting and Other (See Comments)    Has patient had a PCN reaction causing immediate rash, facial/tongue/throat swelling, SOB or lightheadedness with hypotension: No Has patient had a PCN reaction causing severe rash involving mucus membranes or skin necrosis: No Has patient had a PCN reaction that required hospitalization No Has patient had a PCN reaction occurring within the last 10 years: No If all of the above answers are "NO", then may proceed with Cephalosporin use.   Rosuvastatin Other  (See Comments)    Reaction:  Joint pain    Sulfa Antibiotics Other (See Comments)   Tetanus-Diphtheria Toxoids Td Swelling    I personally reviewed active problem list, medication list, allergies, family history with the patient/caregiver today.   ROS  Ten systems reviewed and is negative except as mentioned in HPI    Objective  Vitals:   08/18/23 1343  BP: 124/72  Pulse: 83  Resp: 16  Temp: 98.1 F (36.7 C)  TempSrc: Oral  SpO2: 99%  Weight: 176 lb 1.6 oz (79.9 kg)  Height: 5\' 4"  (1.626 m)    Body mass index is 30.23 kg/m.  Physical Exam  Constitutional: Patient appears well-developed and well-nourished. Obese  No distress.  HEENT: head atraumatic, normocephalic, pupils equal and reactive to light, neck supple Cardiovascular: Normal rate, irregular rhythm , positive for  murmur heard. No BLE edema. Pulmonary/Chest: Effort normal and breath sounds normal. No respiratory distress. Abdominal: Soft.  There is no tenderness. Psychiatric: Patient has a normal mood and affect. behavior is normal. Judgment and thought content normal.     PHQ2/9:    08/18/2023    1:37 PM 05/18/2023    1:09 PM 03/18/2023    1:38 PM 02/04/2023    2:19 PM 11/10/2022    3:39 PM  Depression screen PHQ 2/9  Decreased Interest 0 0 0 0 0  Down, Depressed, Hopeless 0 0 0 0 0  PHQ - 2 Score 0 0 0 0 0  Altered sleeping 0 0 0  0  Tired, decreased energy 0 0 0  0  Change in appetite 0 0 0  0  Feeling bad or failure about yourself  0 0 0  0  Trouble concentrating 0 0 0  0  Moving slowly or fidgety/restless 0 0 0  0  Suicidal thoughts 0 0 0  0  PHQ-9 Score 0 0 0  0  Difficult doing work/chores Not difficult at all Not difficult at all       phq 9 is negative   Fall Risk:    08/18/2023    1:36 PM 05/18/2023    1:09 PM 03/18/2023    1:38 PM 02/04/2023    2:19 PM 11/10/2022    3:39 PM  Fall Risk   Falls in the past year? 0 0  0 0 0  Number falls in past yr: 0 0  0   Injury with Fall? 0 0  0    Risk for fall due to : No Fall Risks  No Fall Risks  No Fall Risks  Follow up Falls prevention discussed;Education provided;Falls evaluation completed  Falls prevention discussed  Falls prevention discussed;Education provided;Falls evaluation completed      Assessment & Plan  Assessment and Plan    Congestive Heart Failure and Pulmonary Hypertension No chest pain, shortness of breath with moderate activity, sleeps with multiple pillows. No recent leg swelling. Medication regimen includes torsemide and metoprolol. Digoxin and losartan discontinued due to side effects. -Continue current medication regimen. -Check kidney function today due to renal infarct per CT done 05/2023 - we will repeat imaging on her next visit if necessary   Atrial Fibrillation and Mechanical Aortic Valve On Coumadin with INR of 2.6. No bleeding issues reported. -Continue Coumadin at current dose.  Hyperlipidemia On Nexlizet. History of atherosclerosis of the aorta. -Continue Nexlizet.  Depression Reports occasional feelings of depression, currently managed with trazodone. -Continue trazodone.  Chronic Bronchitis No current symptoms, quit smoking in 2010. -Continue current management.  Gout No recent flares. -Continue current management.  Paget's Disease of the Breast History of radiation treatment, due for mammogram this month. -Encourage patient to attend mammogram appointment.  Hyperparathyroidism Monitored by nephrologist, no recent follow-up with endocrinologist. -Consider re-referral to endocrinologist if kidney function abnormal.  Prediabetes A1c borderline, reports occasional dry mouth. -Check A1c in March 2025.  General Health Maintenance -Consider Cologuard for colorectal cancer screening, with understanding that positive result would necessitate colonoscopy. -Check comprehensive panel today to assess kidney function. -Order Nexlizet, torsemide, and trazodone refills to [facility] in  [place].

## 2023-08-11 ENCOUNTER — Other Ambulatory Visit: Payer: Self-pay | Admitting: Family Medicine

## 2023-08-11 DIAGNOSIS — I5042 Chronic combined systolic (congestive) and diastolic (congestive) heart failure: Secondary | ICD-10-CM

## 2023-08-18 ENCOUNTER — Encounter: Payer: Self-pay | Admitting: Family Medicine

## 2023-08-18 ENCOUNTER — Ambulatory Visit (INDEPENDENT_AMBULATORY_CARE_PROVIDER_SITE_OTHER): Payer: Medicare Other | Admitting: Family Medicine

## 2023-08-18 VITALS — BP 124/72 | HR 83 | Temp 98.1°F | Resp 16 | Ht 64.0 in | Wt 176.1 lb

## 2023-08-18 DIAGNOSIS — F325 Major depressive disorder, single episode, in full remission: Secondary | ICD-10-CM | POA: Diagnosis not present

## 2023-08-18 DIAGNOSIS — I5042 Chronic combined systolic (congestive) and diastolic (congestive) heart failure: Secondary | ICD-10-CM

## 2023-08-18 DIAGNOSIS — I272 Pulmonary hypertension, unspecified: Secondary | ICD-10-CM | POA: Diagnosis not present

## 2023-08-18 DIAGNOSIS — J410 Simple chronic bronchitis: Secondary | ICD-10-CM

## 2023-08-18 DIAGNOSIS — G4700 Insomnia, unspecified: Secondary | ICD-10-CM | POA: Diagnosis not present

## 2023-08-18 DIAGNOSIS — I480 Paroxysmal atrial fibrillation: Secondary | ICD-10-CM | POA: Diagnosis not present

## 2023-08-18 DIAGNOSIS — Z853 Personal history of malignant neoplasm of breast: Secondary | ICD-10-CM | POA: Diagnosis not present

## 2023-08-18 DIAGNOSIS — N280 Ischemia and infarction of kidney: Secondary | ICD-10-CM

## 2023-08-18 DIAGNOSIS — I700 Atherosclerosis of aorta: Secondary | ICD-10-CM

## 2023-08-18 DIAGNOSIS — N1831 Chronic kidney disease, stage 3a: Secondary | ICD-10-CM

## 2023-08-18 DIAGNOSIS — J42 Unspecified chronic bronchitis: Secondary | ICD-10-CM | POA: Insufficient documentation

## 2023-08-18 DIAGNOSIS — I10 Essential (primary) hypertension: Secondary | ICD-10-CM

## 2023-08-18 DIAGNOSIS — E213 Hyperparathyroidism, unspecified: Secondary | ICD-10-CM | POA: Diagnosis not present

## 2023-08-18 MED ORDER — NEXLIZET 180-10 MG PO TABS: 1.0000 | ORAL_TABLET | Freq: Every day | ORAL | 1 refills | Status: DC

## 2023-08-18 MED ORDER — METOPROLOL TARTRATE 25 MG PO TABS
25.0000 mg | ORAL_TABLET | Freq: Two times a day (BID) | ORAL | 1 refills | Status: DC
Start: 2023-08-18 — End: 2024-03-18

## 2023-08-18 MED ORDER — TORSEMIDE 20 MG PO TABS: 20.0000 mg | ORAL_TABLET | Freq: Every day | ORAL | 1 refills | Status: AC

## 2023-08-18 MED ORDER — TRAZODONE HCL 50 MG PO TABS
50.0000 mg | ORAL_TABLET | Freq: Every evening | ORAL | 0 refills | Status: DC | PRN
Start: 2023-08-18 — End: 2023-11-17

## 2023-08-19 LAB — COMPLETE METABOLIC PANEL WITH GFR
AG Ratio: 1.7 (calc) (ref 1.0–2.5)
ALT: 23 U/L (ref 6–29)
AST: 27 U/L (ref 10–35)
Albumin: 4.5 g/dL (ref 3.6–5.1)
Alkaline phosphatase (APISO): 77 U/L (ref 37–153)
BUN/Creatinine Ratio: 21 (calc) (ref 6–22)
BUN: 20 mg/dL (ref 7–25)
CO2: 30 mmol/L (ref 20–32)
Calcium: 10.1 mg/dL (ref 8.6–10.4)
Chloride: 102 mmol/L (ref 98–110)
Creat: 0.96 mg/dL — ABNORMAL HIGH (ref 0.60–0.95)
Globulin: 2.7 g/dL (ref 1.9–3.7)
Glucose, Bld: 79 mg/dL (ref 65–99)
Potassium: 3.5 mmol/L (ref 3.5–5.3)
Sodium: 143 mmol/L (ref 135–146)
Total Bilirubin: 0.8 mg/dL (ref 0.2–1.2)
Total Protein: 7.2 g/dL (ref 6.1–8.1)
eGFR: 60 mL/min/{1.73_m2} (ref >=60)

## 2023-08-24 ENCOUNTER — Ambulatory Visit
Admission: RE | Admit: 2023-08-24 | Discharge: 2023-08-24 | Disposition: A | Discharge status: Home / Self Care | Payer: Medicare Other | Source: Ambulatory Visit | Attending: Surgery | Admitting: Surgery

## 2023-08-24 DIAGNOSIS — Z1231 Encounter for screening mammogram for malignant neoplasm of breast: Secondary | ICD-10-CM | POA: Diagnosis not present

## 2023-08-24 DIAGNOSIS — Z952 Presence of prosthetic heart valve: Secondary | ICD-10-CM | POA: Diagnosis not present

## 2023-08-24 DIAGNOSIS — Z9889 Other specified postprocedural states: Secondary | ICD-10-CM | POA: Diagnosis not present

## 2023-08-31 DIAGNOSIS — Z952 Presence of prosthetic heart valve: Secondary | ICD-10-CM | POA: Diagnosis not present

## 2023-08-31 DIAGNOSIS — Z9889 Other specified postprocedural states: Secondary | ICD-10-CM | POA: Diagnosis not present

## 2023-08-31 DIAGNOSIS — C50012 Malignant neoplasm of nipple and areola, left female breast: Secondary | ICD-10-CM | POA: Diagnosis not present

## 2023-08-31 DIAGNOSIS — Z1211 Encounter for screening for malignant neoplasm of colon: Secondary | ICD-10-CM | POA: Diagnosis not present

## 2023-11-02 ENCOUNTER — Encounter: Payer: Self-pay | Admitting: Family Medicine

## 2023-11-02 ENCOUNTER — Ambulatory Visit: Payer: Medicare Other | Admitting: Family Medicine

## 2023-11-02 VITALS — BP 126/74 | HR 64 | Temp 97.8°F | Resp 16 | Ht 64.0 in | Wt 179.0 lb

## 2023-11-02 DIAGNOSIS — J329 Chronic sinusitis, unspecified: Secondary | ICD-10-CM

## 2023-11-02 MED ORDER — FLUTICASONE PROPIONATE 50 MCG/ACT NA SUSP
2.0000 | Freq: Every day | NASAL | 6 refills | Status: AC
Start: 2023-11-02 — End: ?

## 2023-11-02 MED ORDER — CETIRIZINE HCL 10 MG PO TABS
5.0000 mg | ORAL_TABLET | Freq: Every day | ORAL | 11 refills | Status: AC
Start: 2023-11-02 — End: ?

## 2023-11-02 MED ORDER — SALINE SPRAY 0.65 % NA SOLN: 1.0000 | NASAL | Status: AC | PRN

## 2023-11-02 NOTE — Patient Instructions (Signed)
 Recommend starting a daily antihistamine like claritin, zyrtec, allegra, or xyzal - daily at bedtime for a few weeks of for as long as you have allergy sypmptoms  You can try a saline nasal spray like Ayr   And continue your flonase nasal spray   Please come back into the office if you have any worsening symptoms

## 2023-11-02 NOTE — Progress Notes (Unsigned)
 Patient ID: [name], female    DOB: 12/07/1940, 83 y.o.   MRN: [id number]  PCP: Alba Cory, MD  Chief Complaint  Patient presents with   Sinusitis    X4 days, a lot of mucus    Subjective:   [name] is a 83 y.o. female, presents to clinic with CC of the following:  HPI  Presents for nasal and sinus congestion and drainage x 4 days   Patient Active Problem List   Diagnosis Date Noted   Chronic bronchitis (HCC) 08/18/2023   Chronic kidney disease, stage 3a (HCC) 03/18/2023   Major depression in remission (HCC) 11/05/2021   Hyperparathyroidism (HCC) 11/05/2021   Tibialis posterior tendinitis 03/08/2020   On Coumadin for atrial fibrillation (HCC) 06/30/2018   Chronic anticoagulation 08/27/2017   History of Paget's disease of breast 08/27/2017   Presence of prosthetic heart valve 08/04/2017   Controlled gout 06/06/2015   Synovial cyst of popliteal space 02/09/2015   Benign hypertension 02/09/2015   Blood type A+ 02/09/2015   Arterial branch occlusion of retina 02/09/2015   Chronic constipation 02/09/2015   Insomnia, persistent 02/09/2015   Chronic combined systolic and diastolic CHF, NYHA class 1 (HCC) 02/09/2015   Dyslipidemia 02/09/2015   Atrial fibrillation (HCC) 02/09/2015   Hearing loss 02/09/2015   History of open heart surgery 02/09/2015   Chronic hoarseness 02/09/2015   Cardiomegaly 02/09/2015   Dysmetabolic syndrome 02/09/2015   Migraine with aura and without status migrainosus, not intractable 02/09/2015   Hypertensive pulmonary vascular disease (HCC) 02/09/2015   Allergic rhinitis, seasonal 02/09/2015   Vitamin D deficiency 02/09/2015   History of mitral valve repair 10/01/2012   History of aortic valve replacement 10/01/2012      Current Outpatient Medications:    acetaminophen (TYLENOL) 500 MG tablet, Take 1,000 mg by mouth every 6 (six) hours as needed for mild pain or moderate pain. , Disp: , Rfl:    albuterol (VENTOLIN HFA)  108 (90 Base) MCG/ACT inhaler, Inhale 2 puffs into the lungs every 6 (six) hours as needed., Disp: , Rfl:    allopurinol (ZYLOPRIM) 300 MG tablet, TAKE 1 TABLET BY MOUTH EVERY DAY, Disp: 90 tablet, Rfl: 1   Bempedoic Acid-Ezetimibe (NEXLIZET) 180-10 MG TABS, Take 1 tablet by mouth daily at 12 noon., Disp: 90 tablet, Rfl: 1   Ergocalciferol (VITAMIN D2) 50 MCG (2000 UT) TABS, Take 2,000 Units by mouth daily., Disp: , Rfl:    KLOR-CON M20 20 MEQ tablet, TAKE 1 TABLET BY MOUTH TWICE A DAY, Disp: 180 tablet, Rfl: 1   metoprolol tartrate (LOPRESSOR) 25 MG tablet, Take 1 tablet (25 mg total) by mouth 2 (two) times daily., Disp: 180 tablet, Rfl: 1   MIEBO 1.338 GM/ML SOLN, Apply 1 drop to eye 4 (four) times daily., Disp: , Rfl:    Multiple Vitamins-Minerals (VITAMIN D3 COMPLETE PO), Take 1 tablet by mouth every other day., Disp: , Rfl:    spironolactone (ALDACTONE) 25 MG tablet, Take 12.5 mg by mouth daily., Disp: , Rfl:    tiotropium (SPIRIVA) 18 MCG inhalation capsule, INHALE 1 CASPULE VIA HANDIHALER ONCE A DAY AT THE SAME TIME EVERY DAY, Disp: 30 capsule, Rfl: 1   torsemide (DEMADEX) 20 MG tablet, Take 1 tablet (20 mg total) by mouth daily., Disp: 90 tablet, Rfl: 1   traZODone (DESYREL) 50 MG tablet, Take 1 tablet (50 mg total) by mouth at bedtime as needed., Disp: 90 tablet, Rfl: 0   warfarin (COUMADIN) 5 MG  tablet, Take 5 mg by mouth daily at 12 noon., Disp: , Rfl:    Allergies  Allergen Reactions   Codeine Nausea And Vomiting and Nausea Only   Ace Inhibitors Cough and Other (See Comments)   Amoxicillin-Pot Clavulanate Nausea And Vomiting   Penicillins Nausea And Vomiting and Other (See Comments)    Has patient had a PCN reaction causing immediate rash, facial/tongue/throat swelling, SOB or lightheadedness with hypotension: No Has patient had a PCN reaction causing severe rash involving mucus membranes or skin necrosis: No Has patient had a PCN reaction that required hospitalization No Has  patient had a PCN reaction occurring within the last 10 years: No If all of the above answers are "NO", then may proceed with Cephalosporin use.   Rosuvastatin Other (See Comments)    Reaction:  Joint pain    Sulfa Antibiotics Other (See Comments)   Tetanus-Diphtheria Toxoids Td Swelling     Social History   Tobacco Use   Smoking status: Former    Current packs/day: 0.00    Types: Cigarettes    Quit date: 09/09/2007    Years since quitting: 16.1   Smokeless tobacco: Never  Vaping Use   Vaping status: Never Used  Substance Use Topics   Alcohol use: No    Alcohol/week: 0.0 standard drinks of alcohol   Drug use: No      Chart Review Today: I personally reviewed active problem list, medication list, allergies, family history, social history, health maintenance, notes from last encounter, lab results, imaging with the patient/caregiver today.   Review of Systems  Constitutional: Negative.  Negative for activity change, appetite change, chills, diaphoresis, fatigue and fever.  HENT: Negative.    Eyes: Negative.   Respiratory: Negative.  Negative for cough, chest tightness, shortness of breath and wheezing.   Cardiovascular: Negative.  Negative for chest pain.  Gastrointestinal: Negative.   Endocrine: Negative.   Genitourinary: Negative.   Musculoskeletal: Negative.   Skin: Negative.   Allergic/Immunologic: Negative.   Neurological: Negative.   Hematological: Negative.   Psychiatric/Behavioral: Negative.    All other systems reviewed and are negative.      Objective:   Vitals:   11/02/23 1435  BP: 126/74  Pulse: 64  Resp: 16  Temp: 97.8 F (36.6 C)  SpO2: 98%  Weight: 179 lb (81.2 kg)  Height: 5\' 4"  (1.626 m)    Body mass index is 30.73 kg/m.  Physical Exam Vitals and nursing note reviewed.  Constitutional:      General: She is not in acute distress.    Appearance: Normal appearance. She is well-developed and well-groomed. She is not ill-appearing,  toxic-appearing or diaphoretic.  HENT:     Head: Normocephalic and atraumatic.     Right Ear: Tympanic membrane, ear canal and external ear normal. There is no impacted cerumen.     Left Ear: Tympanic membrane, ear canal and external ear normal. There is no impacted cerumen.     Nose: Congestion and rhinorrhea present.     Mouth/Throat:     Mouth: Mucous membranes are moist.     Pharynx: Oropharynx is clear. Uvula midline. Postnasal drip present. No pharyngeal swelling, oropharyngeal exudate, posterior oropharyngeal erythema or uvula swelling.     Tonsils: No tonsillar exudate.  Eyes:     General:        Right eye: No discharge.        Left eye: No discharge.     Conjunctiva/sclera: Conjunctivae normal.  Neck:  Trachea: No tracheal deviation.  Cardiovascular:     Rate and Rhythm: Normal rate and regular rhythm.  Pulmonary:     Effort: Pulmonary effort is normal. No respiratory distress.     Breath sounds: No stridor.  Musculoskeletal:        General: Normal range of motion.  Skin:    General: Skin is warm and dry.     Findings: No rash.  Neurological:     Mental Status: She is alert.     Motor: No abnormal muscle tone.     Coordination: Coordination normal.  Psychiatric:        Behavior: Behavior normal. Behavior is cooperative.      Results for orders placed or performed in visit on 08/18/23  COMPLETE METABOLIC PANEL WITH GFR   Collection Time: 08/18/23  2:45 PM  Result Value Ref Range   Glucose, Bld 79 65 - 99 mg/dL   BUN 20 7 - 25 mg/dL   Creat 4.09 (H) 8.11 - 0.95 mg/dL   eGFR 60 > OR = 60 BJ/YNW/2.95A2   BUN/Creatinine Ratio 21 6 - 22 (calc)   Sodium 143 135 - 146 mmol/L   Potassium 3.5 3.5 - 5.3 mmol/L   Chloride 102 98 - 110 mmol/L   CO2 30 20 - 32 mmol/L   Calcium 10.1 8.6 - 10.4 mg/dL   Total Protein 7.2 6.1 - 8.1 g/dL   Albumin 4.5 3.6 - 5.1 g/dL   Globulin 2.7 1.9 - 3.7 g/dL (calc)   AG Ratio 1.7 1.0 - 2.5 (calc)   Total Bilirubin 0.8 0.2 - 1.2  mg/dL   Alkaline phosphatase (APISO) 77 37 - 153 U/L   AST 27 10 - 35 U/L   ALT 23 6 - 29 U/L       Assessment & Plan:   1. Rhinosinusitis (Primary) Pt AVS and plan: Recommend starting a daily antihistamine like claritin, zyrtec, allegra, or xyzal - daily at bedtime for a few weeks of for as long as you have allergy sypmptoms  You can try a saline nasal spray like Ayr   And continue your flonase nasal spray   Please come back into the office if you have any worsening symptoms  - fluticasone (FLONASE) 50 MCG/ACT nasal spray; Place 2 sprays into both nostrils daily.  Dispense: 16 g; Refill: 6 - cetirizine (ZYRTEC) 10 MG tablet; Take 0.5-1 tablets (5-10 mg total) by mouth at bedtime.  Dispense: 30 tablet; Refill: [address]      Danelle Berry, PA-C 11/02/23 2:55 PM

## 2023-11-14 ENCOUNTER — Other Ambulatory Visit: Payer: Self-pay | Admitting: Family Medicine

## 2023-11-14 DIAGNOSIS — G4700 Insomnia, unspecified: Secondary | ICD-10-CM

## 2023-11-17 ENCOUNTER — Ambulatory Visit (INDEPENDENT_AMBULATORY_CARE_PROVIDER_SITE_OTHER): Payer: Medicare Other | Admitting: Family Medicine

## 2023-11-17 ENCOUNTER — Encounter: Payer: Self-pay | Admitting: Family Medicine

## 2023-11-17 VITALS — BP 122/72 | HR 73 | Temp 97.7°F | Resp 16 | Ht 64.0 in | Wt 178.2 lb

## 2023-11-17 DIAGNOSIS — E559 Vitamin D deficiency, unspecified: Secondary | ICD-10-CM

## 2023-11-17 DIAGNOSIS — F325 Major depressive disorder, single episode, in full remission: Secondary | ICD-10-CM

## 2023-11-17 DIAGNOSIS — I4891 Unspecified atrial fibrillation: Secondary | ICD-10-CM

## 2023-11-17 DIAGNOSIS — N280 Ischemia and infarction of kidney: Secondary | ICD-10-CM | POA: Diagnosis not present

## 2023-11-17 DIAGNOSIS — I10 Essential (primary) hypertension: Secondary | ICD-10-CM

## 2023-11-17 DIAGNOSIS — I5042 Chronic combined systolic (congestive) and diastolic (congestive) heart failure: Secondary | ICD-10-CM

## 2023-11-17 DIAGNOSIS — E213 Hyperparathyroidism, unspecified: Secondary | ICD-10-CM | POA: Diagnosis not present

## 2023-11-17 DIAGNOSIS — J410 Simple chronic bronchitis: Secondary | ICD-10-CM

## 2023-11-17 DIAGNOSIS — I272 Pulmonary hypertension, unspecified: Secondary | ICD-10-CM | POA: Diagnosis not present

## 2023-11-17 DIAGNOSIS — I700 Atherosclerosis of aorta: Secondary | ICD-10-CM

## 2023-11-17 DIAGNOSIS — M109 Gout, unspecified: Secondary | ICD-10-CM

## 2023-11-17 DIAGNOSIS — R739 Hyperglycemia, unspecified: Secondary | ICD-10-CM

## 2023-11-17 DIAGNOSIS — G4700 Insomnia, unspecified: Secondary | ICD-10-CM

## 2023-11-17 MED ORDER — TIOTROPIUM BROMIDE MONOHYDRATE 18 MCG IN CAPS: ORAL_CAPSULE | RESPIRATORY_TRACT | 1 refills | Status: DC

## 2023-11-17 MED ORDER — TRAZODONE HCL 50 MG PO TABS: 50.0000 mg | ORAL_TABLET | Freq: Every evening | ORAL | 1 refills | Status: AC | PRN

## 2023-11-17 NOTE — Addendum Note (Signed)
 Addended by: Alba Cory F on: 11/17/2023 03:27 PM   Modules accepted: Level of Service

## 2023-11-17 NOTE — Progress Notes (Signed)
 Name: [name]   MRN: [id number]    DOB: 11-30-1940   Date:11/17/2023       Progress Note  Subjective  Chief Complaint  Chief Complaint  Patient presents with   Medical Management of Chronic Issues   HPI   Chronic combined CHF/pulmonary hypertension vascular disease  : she is on Demadex daily , spironolactone, losartan, beta blocker, , potassium and magnesium daily She has been off  digoxin No chest , but has SOB  with moderate activity , she denies orthopnea, denies lower extremity edema.    Echo done 06/2023 was stable  MODERATE LEFT VENTRICULAR SYSTOLIC DYSFUNCTION WITH MILD LVH  ESTIMATED EF: 40%, CALC EF(2D): 33%  DIASTOLIC FUNCTION CAN'T BE DETERMINED  NORMAL RIGHT VENTRICULAR SYSTOLIC FUNCTION  VALVULAR REGURGITATION: TRIVIAL AR, MILD MR, TRIVIAL PR, MODERATE TR  VALVULAR STENOSIS: BIOPROSTHETIC AoV, No MS, No PS, No TS    HTN: taking metoprolol, spironolactone  and  Losartan, bp is well controlled  No chest pain she has mild SOB with moderate activity.    Hyperlipidemia: she stopped taking Atorvastatin but is taking Zetia, we sent rx of PCSK 9 but she states states she does not want injectables at this time  She  is now on Nexlizet and is tolerating it well. LDL down to 112 ,we will recheck labs    Insomnia: she has not been taking trazodone lately, she has been sleeping well since off work this Summer    Gout: doing well at this time, no recent gout attacks . We will recheck uric acid    History of Paget's disease of left breast: she noticed a growth on left nipple Fall 2021 was referred Dr. Lemar Livings had a biopsy followed by excisional biopsy and had 4 weeks of radiation with Dr. Aggie Cosier , completed treatment. She is up to date with mammogram    Hyperglycemia  She denies polyphagia, or polydipsia Last hgbA1C was 5.9 % , we will recheck labs    Afib/history of aorta valve replacement in 2014 : rate controlled, no fluttering sensation, taking betablocker also on coumadin  and goes to coumadin clinic/Dr. Callwood. Denies bleeding or bruising or blood in stools    Chronic bronchitis : She states she no longer coughing in the mornings but wakes up feeling SOB and uses Spiriva prn only . She quit smoking in 2010. Stable.   Major Depression: she has a long history of depression, used to take SSRI's, symptoms now are intermittent and has been in remission for months, sleep is controlled with Trazodone 50 mg at night . She denies suicidal thoughts or ideation. She is motivated, she is working at [employer] 5 days a week for 6 hours, she states she is feeling great but gets frustrated when other people ask her why she is still working   Hyperparathyroidism: she saw nephrologist and was referred to Endo, possible primary versus secondary hyperparathyroidism, she does not want to go back, last Pth was still elevated at 90, we will recheck it today.   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 We will check kidney function today   Patient Active Problem List   Diagnosis Date Noted   Chronic bronchitis (HCC) 08/18/2023   Chronic kidney disease, stage 3a (HCC) 03/18/2023   Major depression in remission (HCC) 11/05/2021   Hyperparathyroidism (HCC) 11/05/2021   Tibialis posterior tendinitis 03/08/2020   On Coumadin for atrial fibrillation (HCC) 06/30/2018  Chronic anticoagulation 08/27/2017   History of Paget's disease of breast 08/27/2017   Presence of prosthetic heart valve 08/04/2017   Controlled gout 06/06/2015   Synovial cyst of popliteal space 02/09/2015   Benign hypertension 02/09/2015   Blood type A+ 02/09/2015   Arterial branch occlusion of retina 02/09/2015   Chronic constipation 02/09/2015   Insomnia, persistent 02/09/2015   Chronic combined systolic and diastolic CHF, NYHA class 1 (HCC) 02/09/2015   Dyslipidemia 02/09/2015   Atrial fibrillation (HCC) 02/09/2015    Hearing loss 02/09/2015   History of open heart surgery 02/09/2015   Chronic hoarseness 02/09/2015   Cardiomegaly 02/09/2015   Dysmetabolic syndrome 02/09/2015   Migraine with aura and without status migrainosus, not intractable 02/09/2015   Hypertensive pulmonary vascular disease (HCC) 02/09/2015   Allergic rhinitis, seasonal 02/09/2015   Vitamin D deficiency 02/09/2015   History of mitral valve repair 10/01/2012   History of aortic valve replacement 10/01/2012    Past Surgical History:  Procedure Laterality Date   ABDOMINAL HYSTERECTOMY  1996   arm surgery  2019   BREAST BIOPSY Right 1998   neg   BREAST BIOPSY Right 2007   neg   BREAST BIOPSY Right 1992   positive   BREAST EXCISIONAL BIOPSY Left 2000   neg   BREAST LUMPECTOMY Right 1992   BREAST LUMPECTOMY Left 08/17/2020   Procedure: BREAST LUMPECTOMY;  Surgeon: Earline Mayotte, MD;  Location: [facility] ORS;  Service: General;  Laterality: Left;   CARDIAC VALVE REPLACEMENT  2014   CATARACT EXTRACTION W/PHACO Left 03/30/2018   Procedure: CATARACT EXTRACTION PHACO AND INTRAOCULAR LENS PLACEMENT (IOC);  Surgeon: Galen Manila, MD;  Location: [facility] ORS;  Service: Ophthalmology;  Laterality: Left;  [place] 00:31.9 AP% 12.1 CDE 3.87 Fluid Pack lot # [id number]   CESAREAN SECTION     FRACTURE SURGERY Left    ARM/ LEG   LEG SURGERY      Family History  Problem Relation Age of Onset   Cancer Mother    Diabetes Mother    Hypertension Mother    Breast cancer Mother 58   Hypertension Father    Heart failure Father    Cancer Brother        bladder   Cancer Sister        breast   Breast cancer Sister 63   Hypertension Son    Hypertension Daughter    Breast cancer Sister 4    Social History   Tobacco Use   Smoking status: Former    Current packs/day: 0.00    Types: Cigarettes    Quit date: 09/09/2007    Years since quitting: 16.2   Smokeless tobacco: Never  Substance Use Topics   Alcohol use: No    Alcohol/week: 0.0  standard drinks of alcohol     Current Outpatient Medications:    acetaminophen (TYLENOL) 500 MG tablet, Take 1,000 mg by mouth every 6 (six) hours as needed for mild pain or moderate pain. , Disp: , Rfl:    albuterol (VENTOLIN HFA) 108 (90 Base) MCG/ACT inhaler, Inhale 2 puffs into the lungs every 6 (six) hours as needed., Disp: , Rfl:    allopurinol (ZYLOPRIM) 300 MG tablet, TAKE 1 TABLET BY MOUTH EVERY DAY, Disp: 90 tablet, Rfl: 1   Bempedoic Acid-Ezetimibe (NEXLIZET) 180-10 MG TABS, Take 1 tablet by mouth daily at 12 noon., Disp: 90 tablet, Rfl: 1   cetirizine (ZYRTEC) 10 MG tablet, Take 0.5-1 tablets (5-10 mg total) by mouth at bedtime., Disp:  30 tablet, Rfl: 11   Ergocalciferol (VITAMIN D2) 50 MCG (2000 UT) TABS, Take 2,000 Units by mouth daily., Disp: , Rfl:    fluticasone (FLONASE) 50 MCG/ACT nasal spray, Place 2 sprays into both nostrils daily., Disp: 16 g, Rfl: 6   KLOR-CON M20 20 MEQ tablet, TAKE 1 TABLET BY MOUTH TWICE A DAY, Disp: 180 tablet, Rfl: 1   metoprolol tartrate (LOPRESSOR) 25 MG tablet, Take 1 tablet (25 mg total) by mouth 2 (two) times daily., Disp: 180 tablet, Rfl: 1   MIEBO 1.338 GM/ML SOLN, Apply 1 drop to eye 4 (four) times daily., Disp: , Rfl:    Multiple Vitamins-Minerals (VITAMIN D3 COMPLETE PO), Take 1 tablet by mouth every other day., Disp: , Rfl:    sodium chloride (OCEAN) 0.65 % SOLN nasal spray, Place 1 spray into both nostrils as needed for congestion., Disp: , Rfl:    spironolactone (ALDACTONE) 25 MG tablet, Take 12.5 mg by mouth daily., Disp: , Rfl:    tiotropium (SPIRIVA) 18 MCG inhalation capsule, INHALE 1 CASPULE VIA HANDIHALER ONCE A DAY AT THE SAME TIME EVERY DAY, Disp: 30 capsule, Rfl: 1   torsemide (DEMADEX) 20 MG tablet, Take 1 tablet (20 mg total) by mouth daily., Disp: 90 tablet, Rfl: 1   traZODone (DESYREL) 50 MG tablet, Take 1 tablet (50 mg total) by mouth at bedtime as needed., Disp: 90 tablet, Rfl: 0   warfarin (COUMADIN) 5 MG tablet, Take 5  mg by mouth daily at 12 noon., Disp: , Rfl:   Allergies  Allergen Reactions   Codeine Nausea And Vomiting and Nausea Only   Ace Inhibitors Cough and Other (See Comments)   Amoxicillin-Pot Clavulanate Nausea And Vomiting   Penicillins Nausea And Vomiting and Other (See Comments)    Has patient had a PCN reaction causing immediate rash, facial/tongue/throat swelling, SOB or lightheadedness with hypotension: No Has patient had a PCN reaction causing severe rash involving mucus membranes or skin necrosis: No Has patient had a PCN reaction that required hospitalization No Has patient had a PCN reaction occurring within the last 10 years: No If all of the above answers are "NO", then may proceed with Cephalosporin use.   Rosuvastatin Other (See Comments)    Reaction:  Joint pain    Sulfa Antibiotics Other (See Comments)   Tetanus-Diphtheria Toxoids Td Swelling    I personally reviewed active problem list, medication list, allergies with the patient/caregiver today.   ROS  Ten systems reviewed and is negative except as mentioned in HPI    Objective  Vitals:   11/17/23 1416  BP: 122/72  Pulse: 73  Resp: 16  Temp: 97.7 F (36.5 C)  TempSrc: Oral  SpO2: 94%  Weight: 178 lb 3.2 oz (80.8 kg)  Height: 5\' 4"  (1.626 m)    Body mass index is 30.59 kg/m.  Physical Exam  Constitutional: Patient appears well-developed and well-nourished. Obese  No distress.  HEENT: head atraumatic, normocephalic, pupils equal and reactive to light, , neck supple Cardiovascular: Normal rate, regular rhythm 3/6  murmur heard. No BLE edema. Pulmonary/Chest: Effort normal and breath sounds normal. No respiratory distress. Abdominal: Soft.  There is no tenderness. Psychiatric: Patient has a normal mood and affect. behavior is normal. Judgment and thought content normal.   Diabetic Foot Exam:     PHQ2/9:    11/17/2023    2:16 PM 08/18/2023    1:37 PM 05/18/2023    1:09 PM 03/18/2023    1:38 PM  02/04/2023  2:19 PM  Depression screen PHQ 2/9  Decreased Interest 0 0 0 0 0  Down, Depressed, Hopeless 0 0 0 0 0  PHQ - 2 Score 0 0 0 0 0  Altered sleeping 0 0 0 0   Tired, decreased energy 0 0 0 0   Change in appetite 0 0 0 0   Feeling bad or failure about yourself  0 0 0 0   Trouble concentrating 0 0 0 0   Moving slowly or fidgety/restless 0 0 0 0   Suicidal thoughts 0 0 0 0   PHQ-9 Score 0 0 0 0   Difficult doing work/chores Not difficult at all Not difficult at all Not difficult at all      phq 9 is negative  Fall Risk:    11/17/2023    2:17 PM 08/18/2023    1:36 PM 05/18/2023    1:09 PM 03/18/2023    1:38 PM 02/04/2023    2:19 PM  Fall Risk   Falls in the past year? 0 0 0 0 0  Number falls in past yr: 0 0 0  0  Injury with Fall? 0 0 0  0  Risk for fall due to : No Fall Risks No Fall Risks  No Fall Risks   Follow up Falls prevention discussed;Education provided;Falls evaluation completed Falls prevention discussed;Education provided;Falls evaluation completed  Falls prevention discussed      Assessment & Plan  1. Hypertensive pulmonary vascular disease (HCC) (Primary)  Continue follow up with cardiologist and taking medications  2. Hyperparathyroidism (HCC)  - PTH, intact and calcium  3. Renal infarct (HCC)  - Microalbumin / creatinine urine ratio  4. On Coumadin for atrial fibrillation [facility])  Keep follow up with cardiologist   5. Simple chronic bronchitis (HCC)  - tiotropium (SPIRIVA) 18 MCG inhalation capsule; INHALE 1 CASPULE VIA HANDIHALER ONCE A DAY AT THE SAME TIME EVERY DAY  Dispense: 30 capsule; Refill: 1  6. Chronic combined systolic and diastolic CHF, NYHA class 1 (HCC)  Under the care of cardiologist   7. Major depression in remission [facility])  Doing well   8. Atherosclerosis of aorta (HCC)  - Lipid panel  9. Simple chronic bronchitis (HCC)  - tiotropium (SPIRIVA) 18 MCG inhalation capsule; INHALE 1 CASPULE VIA HANDIHALER ONCE A DAY AT  THE SAME TIME EVERY DAY  Dispense: 30 capsule; Refill: 1  10. Benign hypertension  - Microalbumin / creatinine urine ratio - COMPLETE METABOLIC PANEL WITH GFR - CBC with Differential/Platelet  11. Vitamin D deficiency  - VITAMIN D 25 Hydroxy (Vit-D Deficiency, Fractures)  12. Controlled gout  - Uric acid  13. Hyperglycemia  - Hemoglobin A1c  14. Insomnia, unspecified type  - traZODone (DESYREL) 50 MG tablet; Take 1 tablet (50 mg total) by mouth at bedtime as needed.  Dispense: 90 tablet; Refill: 1

## 2023-11-18 ENCOUNTER — Encounter: Payer: Self-pay | Admitting: Family Medicine

## 2023-11-18 LAB — MICROALBUMIN / CREATININE URINE RATIO
Creatinine, Urine: 138 mg/dL (ref 20–275)
Microalb Creat Ratio: 43 mg/g{creat} — ABNORMAL HIGH (ref <30)
Microalb, Ur: 5.9 mg/dL

## 2023-11-18 LAB — COMPLETE METABOLIC PANEL WITH GFR
AG Ratio: 1.7 (calc) (ref 1.0–2.5)
ALT: 15 U/L (ref 6–29)
AST: 25 U/L (ref 10–35)
Albumin: 4.5 g/dL (ref 3.6–5.1)
Alkaline phosphatase (APISO): 62 U/L (ref 37–153)
BUN/Creatinine Ratio: 20 (calc) (ref 6–22)
BUN: 20 mg/dL (ref 7–25)
CO2: 31 mmol/L (ref 20–32)
Calcium: 10 mg/dL (ref 8.6–10.4)
Chloride: 104 mmol/L (ref 98–110)
Creat: 1 mg/dL — ABNORMAL HIGH (ref 0.60–0.95)
Globulin: 2.6 g/dL (ref 1.9–3.7)
Glucose, Bld: 92 mg/dL (ref 65–99)
Potassium: 3.7 mmol/L (ref 3.5–5.3)
Sodium: 142 mmol/L (ref 135–146)
Total Bilirubin: 0.6 mg/dL (ref 0.2–1.2)
Total Protein: 7.1 g/dL (ref 6.1–8.1)
eGFR: 57 mL/min/{1.73_m2} — ABNORMAL LOW (ref >=60)

## 2023-11-18 LAB — CBC WITH DIFFERENTIAL/PLATELET
Absolute Lymphocytes: 2257 {cells}/uL (ref 850–3900)
Absolute Monocytes: 482 {cells}/uL (ref 200–950)
Basophils Absolute: 39 {cells}/uL (ref 0–200)
Basophils Relative: 0.7 %
Eosinophils Absolute: 129 {cells}/uL (ref 15–500)
Eosinophils Relative: 2.3 %
HCT: 37.3 % (ref 35.0–45.0)
Hemoglobin: 12 g/dL (ref 11.7–15.5)
MCH: 29.9 pg (ref 27.0–33.0)
MCHC: 32.2 g/dL (ref 32.0–36.0)
MCV: 92.8 fL (ref 80.0–100.0)
MPV: 11.9 fL (ref 7.5–12.5)
Monocytes Relative: 8.6 %
Neutro Abs: 2694 {cells}/uL (ref 1500–7800)
Neutrophils Relative %: 48.1 %
Platelets: 243 10*3/uL (ref 140–400)
RBC: 4.02 10*6/uL (ref 3.80–5.10)
RDW: 13.8 % (ref 11.0–15.0)
Total Lymphocyte: 40.3 %
WBC: 5.6 10*3/uL (ref 3.8–10.8)

## 2023-11-18 LAB — LIPID PANEL
Cholesterol: 176 mg/dL (ref <200)
HDL: 44 mg/dL — ABNORMAL LOW (ref >=50)
LDL Cholesterol (Calc): 105 mg/dL — ABNORMAL HIGH
Non-HDL Cholesterol (Calc): 132 mg/dL — ABNORMAL HIGH (ref <130)
Total CHOL/HDL Ratio: 4 (calc) (ref <5.0)
Triglycerides: 152 mg/dL — ABNORMAL HIGH (ref <150)

## 2023-11-18 LAB — PTH, INTACT AND CALCIUM
Calcium: 10 mg/dL (ref 8.6–10.4)
PTH: 80 pg/mL — ABNORMAL HIGH (ref 16–77)

## 2023-11-18 LAB — HEMOGLOBIN A1C
Hgb A1c MFr Bld: 5.9 %{Hb} — ABNORMAL HIGH (ref <5.7)
Mean Plasma Glucose: 123 mg/dL
eAG (mmol/L): 6.8 mmol/L

## 2023-11-18 LAB — VITAMIN D 25 HYDROXY (VIT D DEFICIENCY, FRACTURES): Vit D, 25-Hydroxy: 37 ng/mL (ref 30–100)

## 2023-11-18 LAB — URIC ACID: Uric Acid, Serum: 6.9 mg/dL (ref 2.5–7.0)

## 2023-12-18 ENCOUNTER — Other Ambulatory Visit: Payer: Self-pay

## 2023-12-18 ENCOUNTER — Encounter: Payer: Self-pay | Admitting: Internal Medicine

## 2023-12-18 ENCOUNTER — Ambulatory Visit (INDEPENDENT_AMBULATORY_CARE_PROVIDER_SITE_OTHER): Admitting: Internal Medicine

## 2023-12-18 VITALS — BP 130/84 | HR 78 | Temp 98.1°F | Resp 18 | Ht 64.0 in | Wt 173.9 lb

## 2023-12-18 DIAGNOSIS — J40 Bronchitis, not specified as acute or chronic: Secondary | ICD-10-CM

## 2023-12-18 MED ORDER — PREDNISONE 10 MG PO TABS
ORAL_TABLET | ORAL | 0 refills | Status: AC
Start: 2023-12-18 — End: 2023-12-27

## 2023-12-18 MED ORDER — ALBUTEROL SULFATE HFA 108 (90 BASE) MCG/ACT IN AERS: 2.0000 | INHALATION_SPRAY | Freq: Four times a day (QID) | RESPIRATORY_TRACT | 1 refills | Status: DC | PRN

## 2023-12-18 NOTE — Progress Notes (Signed)
 Acute Office Visit  Subjective:     Patient ID: [name], female    DOB: Jan 30, 1941, 83 y.o.   MRN: [id number]  Chief Complaint  Patient presents with   URI    Cough, congestion for 2 weeks    URI  Associated symptoms include congestion, coughing, a sore throat and wheezing. Pertinent negatives include no ear pain or sinus pain.   Patient is in today for cough, congestion x 2 weeks.  Discussed the use of AI scribe software for clinical note transcription with the patient, who gave verbal consent to proceed.  History of Present Illness The patient, with a history of COPD, presents with a three-week history of respiratory symptoms, which started after exposure to pollen. The patient initially experienced a scratchy throat, which progressed to coughing up yellow mucus. The patient sought care at an urgent care facility where she was diagnosed with an upper respiratory infection and prescribed doxycycline. The patient reports improvement in symptoms with the antibiotic, with the mucus now being clear. However, the patient continues to experience coughing and wheezing, which she describes as spasms. The patient also reports urinary incontinence due to the coughing. The patient has been using Spiriva for COPD and has been prescribed Zyrtec and Flonase for allergies. The patient also mentions using a cough suppressant that makes her sleepy.    Review of Systems  Constitutional:  Negative for chills and fever.  HENT:  Positive for congestion and sore throat. Negative for ear pain and sinus pain.   Respiratory:  Positive for cough, sputum production and wheezing.         Objective:    BP 130/84 (Cuff Size: Large)   Pulse 78   Temp 98.1 F (36.7 C) (Oral)   Resp 18   Ht 5\' 4"  (1.626 m)   Wt 173 lb 14.4 oz (78.9 kg)   SpO2 95%   BMI 29.85 kg/m  BP Readings from Last 3 Encounters:  12/18/23 130/84  11/17/23 122/72  11/02/23 126/74   Wt Readings from Last 3 Encounters:   12/18/23 173 lb 14.4 oz (78.9 kg)  11/17/23 178 lb 3.2 oz (80.8 kg)  11/02/23 179 lb (81.2 kg)      Physical Exam Constitutional:      Appearance: Normal appearance.  HENT:     Head: Normocephalic and atraumatic.     Right Ear: Tympanic membrane, ear canal and external ear normal.     Left Ear: Tympanic membrane, ear canal and external ear normal.     Nose: Nose normal.     Mouth/Throat:     Mouth: Mucous membranes are moist.     Comments: Mild PND Eyes:     Conjunctiva/sclera: Conjunctivae normal.  Cardiovascular:     Rate and Rhythm: Normal rate and regular rhythm.  Pulmonary:     Effort: Pulmonary effort is normal.     Breath sounds: Wheezing present.     Comments: Inspiratory and expiratory wheezing throughout  Skin:    General: Skin is warm and dry.  Neurological:     General: No focal deficit present.     Mental Status: She is alert. Mental status is at baseline.  Psychiatric:        Mood and Affect: Mood normal.        Behavior: Behavior normal.     No results found for any visits on 12/18/23.      Assessment & Plan:   Assessment and Plan Assessment & Plan Allergic Rhinitis/Bronchitis  Symptoms consistent with allergies, not infection. Clear mucus, history of relief with steroids. - Continue Zyrtec (cetirizine) and Flonase. - Prescribe prednisone taper: 20 mg for 3 days, 10 mg for 3 days, and 5 mg for 3 days. - Finish current course of doxycycline. - Avoid exposure to cigarette smoke. - Continue Spiriva daily. Use albuterol inhaler as needed for wheezing.    Return if symptoms worsen or fail to improve.  Margarita Mail, DO

## 2023-12-18 NOTE — Patient Instructions (Addendum)
 It was great seeing you today!  Plan discussed at today's visit: -Take Prednisone taper -Finish Doxycycline antibiotic - take with food -Continue Zyrtec for allergies and nasal spray (Flonase) twice daily -Continue Spiriva daily, Albuterol inhaler every 4-6 hours as needed  Follow up in: as needed  Take care and let us know if you have any questions or concerns prior to your next visit.  Dr. Caralee Ates

## 2024-02-05 ENCOUNTER — Other Ambulatory Visit: Payer: Self-pay | Admitting: Internal Medicine

## 2024-02-05 DIAGNOSIS — J40 Bronchitis, not specified as acute or chronic: Secondary | ICD-10-CM

## 2024-02-06 NOTE — Telephone Encounter (Signed)
 Requested Prescriptions  Pending Prescriptions Disp Refills   albuterol  (VENTOLIN  HFA) 108 (90 Base) MCG/ACT inhaler [Pharmacy Med Name: ALBUTEROL  HFA (VENTOLIN ) INH] 18 each 1    Sig: TAKE 2 PUFFS BY MOUTH EVERY 6 HOURS AS NEEDED FOR WHEEZE OR SHORTNESS OF BREATH     Pulmonology:  Beta Agonists 2 Passed - 02/06/2024  2:57 PM      Passed - Last BP in normal range    BP Readings from Last 1 Encounters:  12/18/23 130/84         Passed - Last Heart Rate in normal range    Pulse Readings from Last 1 Encounters:  12/18/23 78         Passed - Valid encounter within last 12 months    Recent Outpatient Visits           1 month ago Bronchitis   [facility] Rockney Cid, DO   2 months ago Hypertensive pulmonary vascular disease [facility])   [place] [facility] Arleen Lacer, MD   3 months ago Rhinosinusitis   [facility] Adeline Hone, PA-C       Future Appointments             In 1 month Sowles, Krichna, MD [facility]

## 2024-02-22 DIAGNOSIS — Z952 Presence of prosthetic heart valve: Secondary | ICD-10-CM | POA: Diagnosis not present

## 2024-02-22 DIAGNOSIS — Z9889 Other specified postprocedural states: Secondary | ICD-10-CM | POA: Diagnosis not present

## 2024-02-22 DIAGNOSIS — Z7901 Long term (current) use of anticoagulants: Secondary | ICD-10-CM | POA: Diagnosis not present

## 2024-03-04 ENCOUNTER — Other Ambulatory Visit: Payer: Self-pay | Admitting: Family Medicine

## 2024-03-04 DIAGNOSIS — I5042 Chronic combined systolic (congestive) and diastolic (congestive) heart failure: Secondary | ICD-10-CM

## 2024-03-04 NOTE — Telephone Encounter (Unsigned)
 Copied from CRM [id number]. Topic: Clinical - Medication Refill >> Mar 04, 2024  3:35 PM Powell HERO wrote: Medication: KLOR-CON  M20 20 MEQ tablet  Has the patient contacted their pharmacy? Yes,   This is the patient's preferred pharmacy:  [facility] [address] [place] - [address] [place] [address] Phone: [phone] Fax: [phone]  Is this the correct pharmacy for this prescription? Yes If no, delete pharmacy and type the correct one.   Has the prescription been filled recently? No, she is out  Is the patient out of the medication? Yes  Has the patient been seen for an appointment in the last year OR does the patient have an upcoming appointment? Yes  Can we respond through MyChart? Yes  Agent: Please be advised that Rx refills may take up to 3 business days. We ask that you follow-up with your pharmacy.

## 2024-03-07 MED ORDER — POTASSIUM CHLORIDE CRYS ER 20 MEQ PO TBCR: 20.0000 meq | EXTENDED_RELEASE_TABLET | Freq: Two times a day (BID) | ORAL | 0 refills | Status: DC

## 2024-03-07 NOTE — Telephone Encounter (Signed)
 Requested medication (s) are due for refill today: yes  Requested medication (s) are on the active medication list: no  Last refill:  08/11/23  Future visit scheduled: yes  Notes to clinic:  Unable to refill per protocol, Rx expired. Not on current list.      Requested Prescriptions  Pending Prescriptions Disp Refills   potassium chloride  SA (KLOR-CON  M20) 20 MEQ tablet 180 tablet 1    Sig: Take 1 tablet (20 mEq total) by mouth 2 (two) times daily.     Endocrinology:  Minerals - Potassium Supplementation Failed - 03/07/2024  2:19 PM      Failed - Cr in normal range and within 360 days    Creat  Date Value Ref Range Status  11/17/2023 1.00 (H) 0.60 - 0.95 mg/dL Final   Creatinine, Urine  Date Value Ref Range Status  11/17/2023 138 20 - 275 mg/dL Final         Passed - K in normal range and within 360 days    Potassium  Date Value Ref Range Status  11/17/2023 3.7 3.5 - 5.3 mmol/L Final  08/17/2012 3.3 (L) 3.5 - 5.1 mmol/L Final         Passed - Valid encounter within last 12 months    Recent Outpatient Visits           2 months ago Bronchitis   [facility] Bernardo Fend, DO   3 months ago Hypertensive pulmonary vascular disease [facility])   [place] [facility] Glenard Mire, MD   4 months ago Rhinosinusitis   [facility] Leavy Mole, PA-C       Future Appointments             In 1 week Sowles, Krichna, MD [facility], [facility]

## 2024-03-08 ENCOUNTER — Ambulatory Visit (INDEPENDENT_AMBULATORY_CARE_PROVIDER_SITE_OTHER): Admitting: Family Medicine

## 2024-03-08 ENCOUNTER — Encounter: Payer: Self-pay | Admitting: Family Medicine

## 2024-03-08 VITALS — BP 124/82 | HR 89 | Resp 16 | Ht 64.0 in | Wt 177.0 lb

## 2024-03-08 DIAGNOSIS — M5441 Lumbago with sciatica, right side: Secondary | ICD-10-CM

## 2024-03-08 MED ORDER — PREDNISONE 20 MG PO TABS: 40.0000 mg | ORAL_TABLET | Freq: Every day | ORAL | 0 refills | Status: AC

## 2024-03-08 NOTE — Progress Notes (Signed)
 Patient ID: [name], female    DOB: Oct 05, 1940, 83 y.o.   MRN: [id number]  PCP: Glenard Mire, MD  Chief Complaint  Patient presents with   Back Pain    X5 days. Was lifting someone and felt an instant sharp pain on right lower back. Taking OTC Tylenol - helps but pain comes back.    Subjective:   [name] is a 82 y.o. 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  It shot burning sharp pain across her right low back and she stopped lifting the lady immediately.  She had gradual onset of pain after that about 2 d later, same location right low back and shooting around hip worse with sitting She gets good relief from tylenol  No numbness tingling No saddle anesthesia, incontinence  She has not done a Dexa (unknown if osteoporosis)   Patient Active Problem List   Diagnosis Date Noted   Atherosclerosis of aorta (HCC) 11/17/2023   Chronic bronchitis (HCC) 08/18/2023   Major depression in remission (HCC) 11/05/2021   Hyperparathyroidism (HCC) 11/05/2021   Tibialis posterior tendinitis 03/08/2020   On Coumadin  for atrial fibrillation (HCC) 06/30/2018   Chronic anticoagulation 08/27/2017   History of Paget's disease of breast 08/27/2017   Presence of prosthetic heart valve 08/04/2017   Controlled gout 06/06/2015   Synovial cyst of popliteal space 02/09/2015   Benign hypertension 02/09/2015   Blood type A+ 02/09/2015   Arterial branch occlusion of retina 02/09/2015   Chronic constipation 02/09/2015   Insomnia, persistent 02/09/2015   Chronic combined systolic and diastolic CHF, NYHA class 1 (HCC) 02/09/2015   Dyslipidemia 02/09/2015   Hearing loss 02/09/2015   History of open heart surgery 02/09/2015   Chronic hoarseness 02/09/2015   Cardiomegaly 02/09/2015   Dysmetabolic syndrome 02/09/2015   Migraine with aura and without status migrainosus, not intractable 02/09/2015    Hypertensive pulmonary vascular disease (HCC) 02/09/2015   Allergic rhinitis, seasonal 02/09/2015   Vitamin D  deficiency 02/09/2015   History of mitral valve repair 10/01/2012   History of aortic valve replacement 10/01/2012      Current Outpatient Medications:    acetaminophen  (TYLENOL ) 500 MG tablet, Take 1,000 mg by mouth every 6 (six) hours as needed for mild pain or moderate pain. , Disp: , Rfl:    albuterol  (VENTOLIN  HFA) 108 (90 Base) MCG/ACT inhaler, TAKE 2 PUFFS BY MOUTH EVERY 6 HOURS AS NEEDED FOR WHEEZE OR SHORTNESS OF BREATH, Disp: 18 each, Rfl: 1   allopurinol  (ZYLOPRIM ) 300 MG tablet, TAKE 1 TABLET BY MOUTH EVERY DAY, Disp: 90 tablet, Rfl: 1   Bempedoic Acid-Ezetimibe  (NEXLIZET ) 180-10 MG TABS, Take 1 tablet by mouth daily at 12 noon., Disp: 90 tablet, Rfl: 1   cetirizine  (ZYRTEC ) 10 MG tablet, Take 0.5-1 tablets (5-10 mg total) by mouth at bedtime., Disp: 30 tablet, Rfl: 11   Ergocalciferol  (VITAMIN D2) 50 MCG (2000 UT) TABS, Take 2,000 Units by mouth daily., Disp: , Rfl:    fluticasone  (FLONASE ) 50 MCG/ACT nasal spray, Place 2 sprays into both nostrils daily., Disp: 16 g, Rfl: 6   metoprolol  tartrate (LOPRESSOR ) 25 MG tablet, Take 1 tablet (25 mg total) by mouth 2 (two) times daily., Disp: 180 tablet, Rfl: 1   MIEBO 1.338 GM/ML SOLN, Apply 1 drop to eye 4 (four) times daily., Disp: , Rfl:    Multiple Vitamins-Minerals (VITAMIN D3  COMPLETE PO), Take 1 tablet by mouth every other day., Disp: , Rfl:    potassium chloride  SA (KLOR-CON  M20) 20 MEQ tablet, Take 1 tablet (20 mEq total) by mouth 2 (two) times daily., Disp: 180 tablet, Rfl: 0   sodium chloride  (OCEAN) 0.65 % SOLN nasal spray, Place 1 spray into both nostrils as needed for congestion., Disp: , Rfl:    spironolactone (ALDACTONE) 25 MG tablet, Take 12.5 mg by mouth daily., Disp: , Rfl:    tiotropium (SPIRIVA ) 18 MCG inhalation capsule, INHALE 1 CASPULE VIA HANDIHALER ONCE A DAY AT THE SAME TIME EVERY DAY, Disp: 30 capsule,  Rfl: 1   torsemide  (DEMADEX ) 20 MG tablet, Take 1 tablet (20 mg total) by mouth daily., Disp: 90 tablet, Rfl: 1   traZODone  (DESYREL ) 50 MG tablet, Take 1 tablet (50 mg total) by mouth at bedtime as needed., Disp: 90 tablet, Rfl: 1   warfarin (COUMADIN ) 5 MG tablet, Take 5 mg by mouth daily at 12 noon., Disp: , Rfl:    Allergies  Allergen Reactions   Codeine Nausea And Vomiting and Nausea Only   Ace Inhibitors Cough and Other (See Comments)   Amoxicillin-Pot Clavulanate Nausea And Vomiting   Penicillins Nausea And Vomiting and Other (See Comments)    Has patient had a PCN reaction causing immediate rash, facial/tongue/throat swelling, SOB or lightheadedness with hypotension: No Has patient had a PCN reaction causing severe rash involving mucus membranes or skin necrosis: No Has patient had a PCN reaction that required hospitalization No Has patient had a PCN reaction occurring within the last 10 years: No If all of the above answers are NO, then may proceed with Cephalosporin use.   Rosuvastatin Other (See Comments)    Reaction:  Joint pain    Sulfa  Antibiotics Other (See Comments)   Tetanus-Diphtheria Toxoids Td Swelling     Social History   Tobacco Use   Smoking status: Former    Current packs/day: 0.00    Types: Cigarettes    Quit date: 09/09/2007    Years since quitting: 16.5   Smokeless tobacco: Never  Vaping Use   Vaping status: Never Used  Substance Use Topics   Alcohol use: No    Alcohol/week: 0.0 standard drinks of alcohol   Drug use: No      Chart Review Today: I personally reviewed active problem list, medication list, allergies, family history, social history, health maintenance, notes from last encounter, lab results, imaging with the patient/caregiver today.   Review of Systems  Constitutional: Negative.   HENT: Negative.    Eyes: Negative.   Respiratory: Negative.    Cardiovascular: Negative.   Gastrointestinal: Negative.   Endocrine: Negative.    Genitourinary: Negative.   Musculoskeletal: Negative.   Skin: Negative.   Allergic/Immunologic: Negative.   Neurological: Negative.   Hematological: Negative.   Psychiatric/Behavioral: Negative.    All other systems reviewed and are negative.      Objective:   Vitals:   03/08/24 1448  BP: 124/82  Pulse: 89  Resp: 16  SpO2: 95%  Weight: 177 lb (80.3 kg)  Height: 5' 4 (1.626 m)    Body mass index is 30.38 kg/m.  Physical Exam Vitals and nursing note reviewed.  Constitutional:      General: She is not in acute distress.    Appearance: Normal appearance. She is well-developed, well-groomed and overweight. She is not ill-appearing, toxic-appearing or diaphoretic.  HENT:     Head: Normocephalic and atraumatic.  Right Ear: External ear normal.     Left Ear: External ear normal.     Nose: Nose normal.   Eyes:     General: No scleral icterus.       Right eye: No discharge.        Left eye: No discharge.     Conjunctiva/sclera: Conjunctivae normal.   Neck:     Trachea: No tracheal deviation.   Cardiovascular:     Rate and Rhythm: Normal rate.  Pulmonary:     Effort: Pulmonary effort is normal. No respiratory distress.     Breath sounds: No stridor.   Musculoskeletal:     Lumbar back: Tenderness (ttp to right SI area) present. No swelling, edema, deformity or bony tenderness. Normal range of motion. Positive right straight leg raise test. Negative left straight leg raise test.       Back:     Comments: No midline tenderness from cervical to lumbar spine, no step off No paraspinal muscle ttp from cervical to lumbar spine Grossly normal ROM of back 5/5 strength bilaterally with dorsiflexion, plantarflexion Grossly normal sensation to light touch to bilateral lower extremities      Skin:    General: Skin is warm and dry.     Findings: No rash.   Neurological:     Mental Status: She is alert.     Motor: No abnormal muscle tone.     Coordination:  Coordination normal.     Gait: Gait normal.   Psychiatric:        Mood and Affect: Mood normal.        Behavior: Behavior normal. Behavior is cooperative.      Results for orders placed or performed in visit on 11/17/23  Lipid panel   Collection Time: 11/17/23  3:07 PM  Result Value Ref Range   Cholesterol 176 <200 mg/dL   HDL 44 (L) > OR = 50 mg/dL   Triglycerides 847 (H) <150 mg/dL   LDL Cholesterol (Calc) 105 (H) mg/dL (calc)   Total CHOL/HDL Ratio 4.0 <5.0 (calc)   Non-HDL Cholesterol (Calc) 132 (H) <130 mg/dL (calc)  Microalbumin / creatinine urine ratio   Collection Time: 11/17/23  3:07 PM  Result Value Ref Range   Creatinine, Urine 138 20 - 275 mg/dL   Microalb, Ur 5.9 mg/dL   Microalb Creat Ratio 43 (H) <30 mg/g creat  COMPLETE METABOLIC PANEL WITH GFR   Collection Time: 11/17/23  3:07 PM  Result Value Ref Range   Glucose, Bld 92 65 - 99 mg/dL   BUN 20 7 - 25 mg/dL   Creat 8.99 (H) 9.39 - 0.95 mg/dL   eGFR 57 (L) > OR = 60 mL/min/1.60m2   BUN/Creatinine Ratio 20 6 - 22 (calc)   Sodium 142 135 - 146 mmol/L   Potassium 3.7 3.5 - 5.3 mmol/L   Chloride 104 98 - 110 mmol/L   CO2 31 20 - 32 mmol/L   Calcium  10.0 8.6 - 10.4 mg/dL   Total Protein 7.1 6.1 - 8.1 g/dL   Albumin 4.5 3.6 - 5.1 g/dL   Globulin 2.6 1.9 - 3.7 g/dL (calc)   AG Ratio 1.7 1.0 - 2.5 (calc)   Total Bilirubin 0.6 0.2 - 1.2 mg/dL   Alkaline phosphatase (APISO) 62 37 - 153 U/L   AST 25 10 - 35 U/L   ALT 15 6 - 29 U/L  CBC with Differential/Platelet   Collection Time: 11/17/23  3:07 PM  Result Value Ref Range  WBC 5.6 3.8 - 10.8 Thousand/uL   RBC 4.02 3.80 - 5.10 Million/uL   Hemoglobin 12.0 11.7 - 15.5 g/dL   HCT 62.6 64.9 - 54.9 %   MCV 92.8 80.0 - 100.0 fL   MCH 29.9 27.0 - 33.0 pg   MCHC 32.2 32.0 - 36.0 g/dL   RDW 86.1 88.9 - 84.9 %   Platelets 243 140 - 400 Thousand/uL   MPV 11.9 7.5 - 12.5 fL   Neutro Abs 2,694 1,500 - 7,800 cells/uL   Absolute Lymphocytes 2,257 850 - 3,900 cells/uL    Absolute Monocytes 482 200 - 950 cells/uL   Eosinophils Absolute 129 15 - 500 cells/uL   Basophils Absolute 39 0 - 200 cells/uL   Neutrophils Relative % 48.1 %   Total Lymphocyte 40.3 %   Monocytes Relative 8.6 %   Eosinophils Relative 2.3 %   Basophils Relative 0.7 %  VITAMIN D  25 Hydroxy (Vit-D Deficiency, Fractures)   Collection Time: 11/17/23  3:07 PM  Result Value Ref Range   Vit D, 25-Hydroxy 37 30 - 100 ng/mL  Hemoglobin A1c   Collection Time: 11/17/23  3:07 PM  Result Value Ref Range   Hgb A1c MFr Bld 5.9 (H) <5.7 % of total Hgb   Mean Plasma Glucose 123 mg/dL   eAG (mmol/L) 6.8 mmol/L  Uric acid   Collection Time: 11/17/23  3:07 PM  Result Value Ref Range   Uric Acid, Serum 6.9 2.5 - 7.0 mg/dL  PTH, intact and calcium    Collection Time: 11/17/23  3:07 PM  Result Value Ref Range   PTH 80 (H) 16 - 77 pg/mL   Calcium  10.0 8.6 - 10.4 mg/dL       Assessment & Plan:   1. 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  Dispense: 10 tablet; Refill: 0 - DG Lumbar Spine Complete  Reviewed plan and f/up with pt     Michelene Cower, PA-C 03/08/24 2:58 PM

## 2024-03-08 NOTE — Patient Instructions (Signed)
 Start the prednisone  tomorrow morning with food in your stomach - try it for 3 to 5 days Continue tylenol  Use a heating pad (heat therapy) And get your xray done this week (it will be low back and right hip)   And let me know in 1-2 weeks if its not starting to improve  I will refer you to physical therapy if you're not improving

## 2024-03-09 ENCOUNTER — Other Ambulatory Visit: Payer: Self-pay | Admitting: Family Medicine

## 2024-03-09 DIAGNOSIS — J410 Simple chronic bronchitis: Secondary | ICD-10-CM

## 2024-03-10 ENCOUNTER — Emergency Department
Admission: EM | Admit: 2024-03-10 | Discharge: 2024-03-10 | Disposition: A | Discharge status: Home / Self Care | Attending: Emergency Medicine | Admitting: Emergency Medicine

## 2024-03-10 ENCOUNTER — Emergency Department

## 2024-03-10 ENCOUNTER — Other Ambulatory Visit: Payer: Self-pay

## 2024-03-10 DIAGNOSIS — R911 Solitary pulmonary nodule: Secondary | ICD-10-CM | POA: Insufficient documentation

## 2024-03-10 DIAGNOSIS — Z7901 Long term (current) use of anticoagulants: Secondary | ICD-10-CM | POA: Diagnosis not present

## 2024-03-10 DIAGNOSIS — R030 Elevated blood-pressure reading, without diagnosis of hypertension: Secondary | ICD-10-CM

## 2024-03-10 DIAGNOSIS — R42 Dizziness and giddiness: Secondary | ICD-10-CM

## 2024-03-10 DIAGNOSIS — I10 Essential (primary) hypertension: Secondary | ICD-10-CM | POA: Insufficient documentation

## 2024-03-10 LAB — URINALYSIS, ROUTINE W REFLEX MICROSCOPIC
Bacteria, UA: NONE SEEN
Bilirubin Urine: NEGATIVE
Glucose, UA: NEGATIVE mg/dL
Ketones, ur: NEGATIVE mg/dL
Leukocytes,Ua: NEGATIVE
Nitrite: NEGATIVE
Protein, ur: 30 mg/dL — AB
Specific Gravity, Urine: 1.011 (ref 1.005–1.030)
Squamous Epithelial / HPF: 0 /HPF (ref 0–5)
pH: 6 (ref 5.0–8.0)

## 2024-03-10 LAB — CBC
HCT: 39.9 % (ref 36.0–46.0)
Hemoglobin: 12.9 g/dL (ref 12.0–15.0)
MCH: 29.6 pg (ref 26.0–34.0)
MCHC: 32.3 g/dL (ref 30.0–36.0)
MCV: 91.5 fL (ref 80.0–100.0)
Platelets: 221 10*3/uL (ref 150–400)
RBC: 4.36 MIL/uL (ref 3.87–5.11)
RDW: 14.5 % (ref 11.5–15.5)
WBC: 12 10*3/uL — ABNORMAL HIGH (ref 4.0–10.5)
nRBC: 0.3 % — ABNORMAL HIGH (ref 0.0–0.2)

## 2024-03-10 LAB — TROPONIN I (HIGH SENSITIVITY)
Troponin I (High Sensitivity): 42 ng/L — ABNORMAL HIGH (ref <18)
Troponin I (High Sensitivity): 39 ng/L — ABNORMAL HIGH (ref <18)

## 2024-03-10 LAB — COMPREHENSIVE METABOLIC PANEL WITH GFR
ALT: 29 U/L (ref 0–44)
AST: 44 U/L — ABNORMAL HIGH (ref 15–41)
Albumin: 4.2 g/dL (ref 3.5–5.0)
Alkaline Phosphatase: 67 U/L (ref 38–126)
Anion gap: 10 (ref 5–15)
BUN: 26 mg/dL — ABNORMAL HIGH (ref 8–23)
CO2: 26 mmol/L (ref 22–32)
Calcium: 9.9 mg/dL (ref 8.9–10.3)
Chloride: 102 mmol/L (ref 98–111)
Creatinine, Ser: 1.07 mg/dL — ABNORMAL HIGH (ref 0.44–1.00)
GFR, Estimated: 53 mL/min — ABNORMAL LOW (ref >60)
Glucose, Bld: 123 mg/dL — ABNORMAL HIGH (ref 70–99)
Potassium: 3.6 mmol/L (ref 3.5–5.1)
Sodium: 138 mmol/L (ref 135–145)
Total Bilirubin: 1.4 mg/dL — ABNORMAL HIGH (ref 0.0–1.2)
Total Protein: 7.6 g/dL (ref 6.5–8.1)

## 2024-03-10 LAB — PROTIME-INR
INR: 3.3 — ABNORMAL HIGH (ref 0.8–1.2)
Prothrombin Time: 35 s — ABNORMAL HIGH (ref 11.4–15.2)

## 2024-03-10 MED ORDER — MECLIZINE HCL 25 MG PO TABS: 25.0000 mg | ORAL_TABLET | Freq: Three times a day (TID) | ORAL | 0 refills | Status: AC | PRN

## 2024-03-10 MED ORDER — ONDANSETRON HCL 4 MG PO TABS: 4.0000 mg | ORAL_TABLET | Freq: Four times a day (QID) | ORAL | 0 refills | Status: AC | PRN

## 2024-03-10 MED ORDER — MECLIZINE HCL 25 MG PO TABS
25.0000 mg | ORAL_TABLET | Freq: Once | ORAL | Status: AC
  Administered 2024-03-10: 25 mg via ORAL
  Filled 2024-03-10: qty 1

## 2024-03-10 NOTE — ED Notes (Signed)
 Patient transported to CT

## 2024-03-10 NOTE — Discharge Instructions (Addendum)
 You were seen in the emergency department today for evaluation of your dizziness and elevated blood pressure readings.  I suspect your dizziness is likely related to vertigo.  I sent a prescription for meclizine  for dizziness and Zofran  for nausea to your pharmacy.  As we discussed, your x-Monnie Gudgel here was concerning for a pulmonary nodule over your right lung.  Please follow-up with your primary care doctor as our radiologist did recommend a CT scan to further evaluate this.  Return to the ER for any new or worsening symptoms including worsening dizziness, chest pain, shortness of breath, new numbness, tingling, weakness, or any other new or concerning symptoms.

## 2024-03-10 NOTE — ED Provider Notes (Signed)
 [facility] Provider Note    Event Date/Time   First MD Initiated Contact with Patient 03/10/24 [phone]     (approximate)   History   Dizziness   HPI  [name] is a 83 year old female with history of bicuspid aortic stenosis s/p aortic valve replacement on warfarin, A-fib, HTN presenting to the emergency department for evaluation of dizziness and elevated blood pressure. Yesterday around 5 PM, patient had onset of dizziness described as a spinning sensation when she stood up.  She has a history of vertigo in the past, and says that this felt identical.  Has been a few years since she had an episode.  Does report some ear fullness over the past few days.  No vertigo at rest, does note it with positional changes.  Today, she had recurrent symptoms.  She checked her blood pressure no significantly elevated with a BP of 190s over 120s.  She took her metoprolol  dose a few hours early and presented to the ER for further evaluation.  Denies chest pain or shortness of breath.     Physical Exam   Triage Vital Signs: ED Triage Vitals  Encounter Vitals Group     BP 03/10/24 0729 (!) 153/92     Girls Systolic BP Percentile --      Girls Diastolic BP Percentile --      Boys Systolic BP Percentile --      Boys Diastolic BP Percentile --      Pulse Rate 03/10/24 0729 70     Resp 03/10/24 0729 18     Temp 03/10/24 0729 98 F (36.7 C)     Temp src --      SpO2 03/10/24 0729 96 %     Weight 03/10/24 0728 177 lb (80.3 kg)     Height 03/10/24 0728 5' 4 (1.626 m)     Head Circumference --      Peak Flow --      Pain Score 03/10/24 0729 7     Pain Loc --      Pain Education --      Exclude from Growth Chart --     Most recent vital signs: Vitals:   03/10/24 1000 03/10/24 1030  BP: (!) 121/50 (!) 156/89  Pulse: (!) 54 63  Resp: (!) 22 20  Temp:    SpO2: 98% 100%     General: Awake, interactive  HEENT: TMs clear bilaterally CV:  Regular rate, good  peripheral perfusion.  Resp:  Unlabored respirations, lungs clear to auscultation Abd:  Nondistended, soft, nontender Neuro:  Alert and oriented, normal extraocular movements, symmetric facial movement, sensation intact over bilateral upper and lower extremities with 5 out of 5 strength.  ED Results / Procedures / Treatments   Labs (all labs ordered are listed, but only abnormal results are displayed) Labs Reviewed  COMPREHENSIVE METABOLIC PANEL WITH GFR - Abnormal; Notable for the following components:      Result Value   Glucose, Bld 123 (*)    BUN 26 (*)    Creatinine, Ser 1.07 (*)    AST 44 (*)    Total Bilirubin 1.4 (*)    GFR, Estimated 53 (*)    All other components within normal limits  CBC - Abnormal; Notable for the following components:   WBC 12.0 (*)    nRBC 0.3 (*)    All other components within normal limits  URINALYSIS, ROUTINE W REFLEX MICROSCOPIC - Abnormal; Notable for the following  components:   Color, Urine YELLOW (*)    APPearance CLEAR (*)    Hgb urine dipstick SMALL (*)    Protein, ur 30 (*)    All other components within normal limits  PROTIME-INR - Abnormal; Notable for the following components:   Prothrombin Time 35.0 (*)    INR 3.3 (*)    All other components within normal limits  TROPONIN I (HIGH SENSITIVITY) - Abnormal; Notable for the following components:   Troponin I (High Sensitivity) 42 (*)    All other components within normal limits  TROPONIN I (HIGH SENSITIVITY) - Abnormal; Notable for the following components:   Troponin I (High Sensitivity) 39 (*)    All other components within normal limits     EKG EKG independently reviewed and interpreted by myself demonstrates:  EKG demonstrates A-fib at a rate of 67, QRS 144, QTc 435, no acute ST changes, LBBB noted  RADIOLOGY Imaging independently reviewed and interpreted by myself demonstrates:  CT head without acute bleed CXR without obvious focal consolidation.  Does note area of likely  atelectasis as well as questionable new pulmonary nodules.  Radiology recommends CT, patient prefers to arrange this as an outpatient which I do think is reasonable in the absence of her cardiopulmonary symptoms.  Formal Radiology Read:  CT Head Wo Contrast Result Date: 03/10/2024 CLINICAL DATA:  83 year old female with dizziness and hypertension. EXAM: CT HEAD WITHOUT CONTRAST TECHNIQUE: Contiguous axial images were obtained from the base of the skull through the vertex without intravenous contrast. RADIATION DOSE REDUCTION: This exam was performed according to the departmental dose-optimization program which includes automated exposure control, adjustment of the mA and/or kV according to patient size and/or use of iterative reconstruction technique. COMPARISON:  Brain MRI 01/15/2012.  Head CT 07/31/2017. FINDINGS: Brain: Cerebral volume is within normal limits for age. No midline shift, ventriculomegaly, mass effect, evidence of mass lesion, intracranial hemorrhage or evidence of cortically based acute infarction. Stable gray-white matter differentiation throughout the brain. Mild for age scattered cerebral white matter hypodensity. Tiny chronic infarct in the posteroinferior right cerebellum is stable (coronal image 54). Vascular: Calcified atherosclerosis at the skull base. No suspicious intracranial vascular hyperdensity. Skull: Stable and intact.  No acute osseous abnormality identified. Sinuses/Orbits: Visualized paranasal sinuses and mastoids are stable and well aerated. Other: Visualized orbits and scalp soft tissues are within normal limits. IMPRESSION: 1. No acute intracranial abnormality. 2. Stable non contrast CT appearance of mild for age chronic white matter changes. Electronically Signed   By: VEAR Hurst M.D.   On: 03/10/2024 09:24   DG Chest Portable 1 View Result Date: 03/10/2024 CLINICAL DATA:  Dizziness and weakness. EXAM: PORTABLE CHEST 1 VIEW COMPARISON:  07/17/2015 FINDINGS: The cardio  pericardial silhouette is enlarged. There is pulmonary vascular congestion without overt pulmonary edema. Streaky density in the left mid and lower lung suggests atelectasis. Adjacent nodular densities overlie the right costophrenic sulcus with no pulmonary nodules visible in this region on chest CT of 05/16/2023. No acute bony abnormality. Telemetry leads overlie the chest. IMPRESSION: 1. Enlargement of the cardiopericardial silhouette with pulmonary vascular congestion. 2. Streaky density in the left mid and lower lung suggests atelectasis. 3. Adjacent nodular densities overlie the right costophrenic sulcus with no pulmonary nodules visible in this region on chest CT of 05/16/2023. Follow-up chest CT recommended to further evaluate. Electronically Signed   By: Camellia Candle M.D.   On: 03/10/2024 09:12    PROCEDURES:  Critical Care performed: No  Procedures  MEDICATIONS ORDERED IN ED: Medications  meclizine  (ANTIVERT ) tablet 25 mg (25 mg Oral Given 03/10/24 0811)     IMPRESSION / MDM / ASSESSMENT AND PLAN / ED COURSE  I reviewed the triage vital signs and the nursing notes.  Differential diagnosis includes, but is not limited to, description of dizziness seems most consistent with peripheral vertigo, but in the setting of elevated blood pressure, consideration for hypertensive emergency including intracranial bleed, lower suspicion cardiac strain, pulmonary edema, very low suspicion central vertigo given positional nature, reassuring neurologic exam, history of similar in the past  Patient's presentation is most consistent with acute presentation with potential threat to life or bodily function.  83 year old female presenting to the emergency department for evaluation of dizziness described as prior episodes of vertigo and elevated blood pressure.  BP improved here.  Reassuring neurologic exam.  Will obtain labs, head CT, x-Tarell Schollmeyer to further evaluate.  Will also trial meclizine .  Clinical  Course as of 03/10/24 1040  Thu Mar 10, 2024  9062 Patient reassessed.  She reports complete resolution of her dizziness after taking meclizine .  She was updated on the results of her workup.  Does note that she thinks she has had her heart stress enzymes checked in the past, but several years ago.  On further review of her records, I do see that it was checked in 2016, 0.05 on traditional troponin x 2.  With this, suspect likely chronic troponin leak, but will obtain repeat.  Labs with mild leukocytosis, clinical presentation overall not suggestive of infection.  Stable renal impairment.  Urine without evidence of infection.  INR 3.3, within patient's goal range.  CT head without acute findings. Chest x-Akiem Urieta with some pulmonary vascular congestion, areas suggestive of atelectasis.  Radiology does note nodular densities that are new compared to prior CT.  They did recommend a follow-up chest CT.  I did discuss obtaining this in the ER versus outpatient follow-up.  Patient did prefer to follow-up with her primary care doctor to arrange further evaluation.  She is comfortable with plan for repeat troponin.  Given resolution of her symptoms, do not feel further neuroimaging indicated. [NR]  1023 Troponin I (High Sensitivity)(!): 39 Repeat troponin stable to downtrending.  Patient has been without chest pain, low suspicion acute ischemia.  Patient remains feeling improved.  Do think she is stable for discharge with outpatient follow-up.  Strict return precautions provided.  Patient discharged in stable condition. [NR]    Clinical Course User Index [NR] Levander Slate, MD     FINAL CLINICAL IMPRESSION(S) / ED DIAGNOSES   Final diagnoses:  Vertigo  Elevated blood pressure reading  Pulmonary nodule     Rx / DC Orders   ED Discharge Orders          Ordered    meclizine  (ANTIVERT ) 25 MG tablet  3 times daily PRN        03/10/24 1032    ondansetron  (ZOFRAN ) 4 MG tablet  Every 6 hours PRN        03/10/24  1032             Note:  This document was prepared using Dragon voice recognition software and may include unintentional dictation errors.   Levander Slate, MD 03/10/24 1040

## 2024-03-10 NOTE — ED Triage Notes (Addendum)
 Pt comes with dizziness, HTN that started yesterday. Pt states hx of vertigo. Pt states she is on meds for HTN.   Pt denies any cp or sob. Pt states also having some back pain from lifting a pt recently. Pt did see her pcp and placed on meds for that.

## 2024-03-14 ENCOUNTER — Ambulatory Visit
Admission: RE | Admit: 2024-03-14 | Discharge: 2024-03-14 | Disposition: A | Discharge status: Home / Self Care | Attending: Family Medicine | Admitting: Family Medicine

## 2024-03-14 ENCOUNTER — Ambulatory Visit
Admission: RE | Admit: 2024-03-14 | Discharge: 2024-03-14 | Disposition: A | Discharge status: Home / Self Care | Source: Ambulatory Visit | Attending: Family Medicine | Admitting: Family Medicine

## 2024-03-14 ENCOUNTER — Telehealth: Payer: Self-pay | Admitting: Family Medicine

## 2024-03-14 DIAGNOSIS — M5441 Lumbago with sciatica, right side: Secondary | ICD-10-CM | POA: Diagnosis present

## 2024-03-14 NOTE — Telephone Encounter (Signed)
 Called and informed patient. Patient gave verbal understanding.

## 2024-03-14 NOTE — Telephone Encounter (Signed)
 Pt just had her xray today but is still in a lot of pain. She has taken all steroids. Does she need another refill of the medication or should she continue with the tylenol  along with the heating pad. Uses [facility]  [phone]

## 2024-03-16 ENCOUNTER — Ambulatory Visit: Payer: Self-pay | Admitting: Family Medicine

## 2024-03-18 ENCOUNTER — Encounter: Payer: Self-pay | Admitting: Family Medicine

## 2024-03-18 ENCOUNTER — Ambulatory Visit (INDEPENDENT_AMBULATORY_CARE_PROVIDER_SITE_OTHER): Admitting: Family Medicine

## 2024-03-18 VITALS — BP 124/70 | HR 70 | Resp 16 | Ht 64.0 in | Wt 176.0 lb

## 2024-03-18 DIAGNOSIS — I10 Essential (primary) hypertension: Secondary | ICD-10-CM

## 2024-03-18 DIAGNOSIS — E213 Hyperparathyroidism, unspecified: Secondary | ICD-10-CM

## 2024-03-18 DIAGNOSIS — T466X5D Adverse effect of antihyperlipidemic and antiarteriosclerotic drugs, subsequent encounter: Secondary | ICD-10-CM

## 2024-03-18 DIAGNOSIS — N1831 Chronic kidney disease, stage 3a: Secondary | ICD-10-CM | POA: Diagnosis not present

## 2024-03-18 DIAGNOSIS — M51369 Other intervertebral disc degeneration, lumbar region without mention of lumbar back pain or lower extremity pain: Secondary | ICD-10-CM | POA: Diagnosis not present

## 2024-03-18 DIAGNOSIS — J410 Simple chronic bronchitis: Secondary | ICD-10-CM | POA: Diagnosis not present

## 2024-03-18 DIAGNOSIS — I700 Atherosclerosis of aorta: Secondary | ICD-10-CM

## 2024-03-18 DIAGNOSIS — G720 Drug-induced myopathy: Secondary | ICD-10-CM

## 2024-03-18 DIAGNOSIS — I272 Pulmonary hypertension, unspecified: Secondary | ICD-10-CM | POA: Diagnosis not present

## 2024-03-18 DIAGNOSIS — M109 Gout, unspecified: Secondary | ICD-10-CM

## 2024-03-18 DIAGNOSIS — I5042 Chronic combined systolic (congestive) and diastolic (congestive) heart failure: Secondary | ICD-10-CM | POA: Diagnosis not present

## 2024-03-18 MED ORDER — ALLOPURINOL 300 MG PO TABS: 300.0000 mg | ORAL_TABLET | Freq: Every day | ORAL | 1 refills | Status: AC

## 2024-03-18 MED ORDER — TIZANIDINE HCL 2 MG PO CAPS: 2.0000 mg | ORAL_CAPSULE | Freq: Three times a day (TID) | ORAL | 0 refills | Status: DC

## 2024-03-18 MED ORDER — LUMBAR BACK BRACE/SUPPORT PAD MISC: 1.0000 | Freq: Every day | 0 refills | Status: AC | PRN

## 2024-03-18 MED ORDER — LIDOCAINE 5 % EX PTCH: 1.0000 | MEDICATED_PATCH | CUTANEOUS | 0 refills | Status: DC

## 2024-03-18 NOTE — Progress Notes (Signed)
 Name: [name]   MRN: [id number]    DOB: 04/16/1941   Date:03/18/2024       Progress Note  Subjective  Chief Complaint  Chief Complaint  Patient presents with   Medical Management of Chronic Issues   Discussed the use of AI scribe software for clinical note transcription with the patient, who gave verbal consent to proceed.  History of Present Illness [name] is an 83 year old female with chronic heart conditions who presents with back pain and sciatica.  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 The pain is described as a 'streak of lightning' across the lower back, specifically on the right side, in the area behind the pelvis. She has been using heating pads and Tylenol  for pain management with minimal improvement. She was told that her x-ray showed arthritis in the lumbar spine, and was given  prednisone  for five days. The pain is localized without radiation down the leg, and she denies any current shooting pain.  She has atrial fibrillation, congestive heart failure , pulmonary hypertension and atherosclerosis of Aorta . Her medications include metoprolol  XL, spironolactone, torsemide , and Coumadin .  No shortness of breath, leg swelling, or chest pain. She sees cardiologist and recent started on Imdur but is only taking it occasionally. She is also now on Toprol  XL instead of lopressor  , hydralazine, spironolactone and torsemide .. She has statin myopathy but is tolerating Nexlizet    Her sleep is reportedly good, and she occasionally uses trazodone  for sleep.   She is not experiencing any respiratory symptoms and uses Spiriva  and albuterol  as needed for chronic bronchitis  She has chronic kidney disease stage 3 a , and hyperparathyroidism. She manages gout with allopurinol  and maintains a diet low in red meat to prevent flare-ups. She reports no current symptoms of gout. 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    Patient Active Problem List   Diagnosis Date Noted   Atherosclerosis of aorta (HCC) 11/17/2023   Chronic bronchitis (HCC) 08/18/2023   Major depression in remission (HCC) 11/05/2021   Hyperparathyroidism (HCC) 11/05/2021   Tibialis posterior tendinitis 03/08/2020   On Coumadin  for atrial fibrillation (HCC) 06/30/2018   Chronic anticoagulation 08/27/2017   History of Paget's disease of breast 08/27/2017   Presence of prosthetic heart valve 08/04/2017   Controlled gout 06/06/2015   Synovial cyst of popliteal space 02/09/2015   Benign hypertension 02/09/2015   Blood type A+ 02/09/2015   Arterial branch occlusion of retina 02/09/2015   Chronic constipation 02/09/2015   Insomnia, persistent 02/09/2015   Chronic combined systolic and diastolic CHF, NYHA class 1 (HCC) 02/09/2015   Dyslipidemia 02/09/2015   Hearing loss 02/09/2015   History of open heart surgery 02/09/2015   Chronic hoarseness 02/09/2015   Cardiomegaly 02/09/2015   Dysmetabolic syndrome 02/09/2015   Migraine with aura and without status migrainosus, not intractable 02/09/2015   Hypertensive pulmonary vascular disease (HCC) 02/09/2015   Allergic rhinitis, seasonal 02/09/2015   Vitamin D  deficiency 02/09/2015   History of mitral valve repair 10/01/2012   History of aortic valve replacement 10/01/2012    Past Surgical History:  Procedure Laterality Date   ABDOMINAL HYSTERECTOMY  1996   arm surgery  2019   BREAST BIOPSY Right 1998   neg  BREAST BIOPSY Right 2007   neg   BREAST BIOPSY Right 1992   positive   BREAST EXCISIONAL BIOPSY Left 2000   neg   BREAST LUMPECTOMY Right 1992   BREAST LUMPECTOMY Left 08/17/2020   Procedure: BREAST LUMPECTOMY;  Surgeon: Dessa Reyes ORN, MD;  Location: [facility] ORS;  Service: General;  Laterality: Left;   CARDIAC  VALVE REPLACEMENT  2014   CATARACT EXTRACTION W/PHACO Left 03/30/2018   Procedure: CATARACT EXTRACTION PHACO AND INTRAOCULAR LENS PLACEMENT (IOC);  Surgeon: Jaye Fallow, MD;  Location: [facility] OR[facility];  Service: Ophthalmology;  Laterality: Left;  US  00:31.9 AP% 12.1 CDE 3.87 Fluid Pack lot # [id number]   CESAREAN SECTION     FRACTURE SURGERY Left    ARM/ LEG   LEG SURGERY      Family History  Problem Relation Age of Onset   Cancer Mother    Diabetes Mother    Hypertension Mother    Breast cancer Mother 2   Hypertension Father    Heart failure Father    Cancer Brother        bladder   Cancer Sister        breast   Breast cancer Sister 38   Hypertension Son    Hypertension Daughter    Breast cancer Sister 76    Social History   Tobacco Use   Smoking status: Former    Current packs/day: 0.00    Types: Cigarettes    Quit date: 09/09/2007    Years since quitting: 16.5   Smokeless tobacco: Never  Substance Use Topics   Alcohol use: No    Alcohol/week: 0.0 standard drinks of alcohol     Current Outpatient Medications:    acetaminophen  (TYLENOL ) 500 MG tablet, Take 1,000 mg by mouth every 6 (six) hours as needed for mild pain or moderate pain. , Disp: , Rfl:    albuterol  (VENTOLIN  HFA) 108 (90 Base) MCG/ACT inhaler, TAKE 2 PUFFS BY MOUTH EVERY 6 HOURS AS NEEDED FOR WHEEZE OR SHORTNESS OF BREATH, Disp: 18 each, Rfl: 1   allopurinol  (ZYLOPRIM ) 300 MG tablet, TAKE 1 TABLET BY MOUTH EVERY DAY, Disp: 90 tablet, Rfl: 1   Bempedoic Acid-Ezetimibe  (NEXLIZET ) 180-10 MG TABS, Take 1 tablet by mouth daily at 12 noon., Disp: 90 tablet, Rfl: 1   cetirizine  (ZYRTEC ) 10 MG tablet, Take 0.5-1 tablets (5-10 mg total) by mouth at bedtime., Disp: 30 tablet, Rfl: 11   Ergocalciferol  (VITAMIN D2) 50 MCG (2000 UT) TABS, Take 2,000 Units by mouth daily., Disp: , Rfl:    fluticasone  (FLONASE ) 50 MCG/ACT nasal spray, Place 2 sprays into both nostrils daily., Disp: 16 g, Rfl: 6   meclizine  (ANTIVERT )  25 MG tablet, Take 1 tablet (25 mg total) by mouth 3 (three) times daily as needed for dizziness., Disp: 30 tablet, Rfl: 0   metoprolol  tartrate (LOPRESSOR ) 25 MG tablet, Take 1 tablet (25 mg total) by mouth 2 (two) times daily., Disp: 180 tablet, Rfl: 1   MIEBO 1.338 GM/ML SOLN, Apply 1 drop to eye 4 (four) times daily., Disp: , Rfl:    Multiple Vitamins-Minerals (VITAMIN D3 COMPLETE PO), Take 1 tablet by mouth every other day., Disp: , Rfl:    potassium chloride  SA (KLOR-CON  M20) 20 MEQ tablet, Take 1 tablet (20 mEq total) by mouth 2 (two) times daily., Disp: 180 tablet, Rfl: 0   sodium chloride  (OCEAN) 0.65 % SOLN nasal spray, Place 1 spray into both nostrils as needed for congestion., Disp: , Rfl:  spironolactone (ALDACTONE) 25 MG tablet, Take 12.5 mg by mouth daily., Disp: , Rfl:    tiotropium (SPIRIVA  HANDIHALER) 18 MCG inhalation capsule, INHALE 1 CASPULE VIA HANDIHALER ONCE A DAY AT THE SAME TIME EVERY DAY, Disp: 90 capsule, Rfl: 1   torsemide  (DEMADEX ) 20 MG tablet, Take 1 tablet (20 mg total) by mouth daily., Disp: 90 tablet, Rfl: 1   traZODone  (DESYREL ) 50 MG tablet, Take 1 tablet (50 mg total) by mouth at bedtime as needed., Disp: 90 tablet, Rfl: 1   warfarin (COUMADIN ) 5 MG tablet, Take 5 mg by mouth daily at 12 noon., Disp: , Rfl:   Allergies  Allergen Reactions   Codeine Nausea And Vomiting and Nausea Only   Ace Inhibitors Cough and Other (See Comments)   Amoxicillin-Pot Clavulanate Nausea And Vomiting   Penicillins Nausea And Vomiting and Other (See Comments)    Has patient had a PCN reaction causing immediate rash, facial/tongue/throat swelling, SOB or lightheadedness with hypotension: No Has patient had a PCN reaction causing severe rash involving mucus membranes or skin necrosis: No Has patient had a PCN reaction that required hospitalization No Has patient had a PCN reaction occurring within the last 10 years: No If all of the above answers are NO, then may proceed with  Cephalosporin use.   Rosuvastatin Other (See Comments)    Reaction:  Joint pain    Sulfa  Antibiotics Other (See Comments)   Tetanus-Diphtheria Toxoids Td Swelling    I personally reviewed active problem list, medication list, allergies with the patient/caregiver today.   ROS  Ten systems reviewed and is negative except as mentioned in HPI    Objective Physical Exam  CONSTITUTIONAL: Patient appears well-developed and well-nourished. No distress. HEENT: Head atraumatic, normocephalic, neck supple. CARDIOVASCULAR: Irregular rhythm, systolic ejection murmur present. No BLE edema. PULMONARY: Effort normal and breath sounds normal. Lungs normal. No respiratory distress. ABDOMINAL: There is no tenderness or distention. MUSCULOSKELETAL: Normal gait. Without gross motor or sensory deficit. PSYCHIATRIC: Patient has a normal mood and affect. Behavior is normal. Judgment and thought content normal.  Vitals:   03/18/24 1443  BP: 124/70  Pulse: 70  Resp: 16  SpO2: 96%  Weight: 176 lb (79.8 kg)  Height: 5' 4 (1.626 m)    Body mass index is 30.21 kg/m.  Recent Results (from the past 2160 hours)  Comprehensive metabolic panel     Status: Abnormal   Collection Time: 03/10/24  7:48 AM  Result Value Ref Range   Sodium 138 135 - 145 mmol/L   Potassium 3.6 3.5 - 5.1 mmol/L   Chloride 102 98 - 111 mmol/L   CO2 26 22 - 32 mmol/L   Glucose, Bld 123 (H) 70 - 99 mg/dL    Comment: Glucose reference range applies only to samples taken after fasting for at least 8 hours.   BUN 26 (H) 8 - 23 mg/dL   Creatinine, Ser 8.92 (H) 0.44 - 1.00 mg/dL   Calcium  9.9 8.9 - 10.3 mg/dL   Total Protein 7.6 6.5 - 8.1 g/dL   Albumin 4.2 3.5 - 5.0 g/dL   AST 44 (H) 15 - 41 U/L   ALT 29 0 - 44 U/L   Alkaline Phosphatase 67 38 - 126 U/L   Total Bilirubin 1.4 (H) 0.0 - 1.2 mg/dL   GFR, Estimated 53 (L) >60 mL/min    Comment: (NOTE) Calculated using the CKD-EPI Creatinine Equation (2021)    Anion gap 10 5 -  15    Comment:  Performed at [facility], [address] [place], [place] [address]  CBC     Status: Abnormal   Collection Time: 03/10/24  7:48 AM  Result Value Ref Range   WBC 12.0 (H) 4.0 - 10.5 K/uL   RBC 4.36 3.87 - 5.11 MIL/uL   Hemoglobin 12.9 12.0 - 15.0 g/dL   HCT 60.0 63.9 - 53.9 %   MCV 91.5 80.0 - 100.0 fL   MCH 29.6 26.0 - 34.0 pg   MCHC 32.3 30.0 - 36.0 g/dL   RDW 85.4 88.4 - 84.4 %   Platelets 221 150 - 400 K/uL   nRBC 0.3 (H) 0.0 - 0.2 %    Comment: Performed at [facility], [address] [place], [place] [address]  Troponin I (High Sensitivity)     Status: Abnormal   Collection Time: 03/10/24  7:48 AM  Result Value Ref Range   Troponin I (High Sensitivity) 42 (H) <18 ng/L    Comment: (NOTE) Elevated high sensitivity troponin I (hsTnI) values and significant  changes across serial measurements may suggest ACS but many other  chronic and acute conditions are known to elevate hsTnI results.  Refer to the Links section for chest pain algorithms and additional  guidance. Performed at [facility], [address] [place], [place] [address]   Protime-INR     Status: Abnormal   Collection Time: 03/10/24  8:04 AM  Result Value Ref Range   Prothrombin Time 35.0 (H) 11.4 - 15.2 seconds   INR 3.3 (H) 0.8 - 1.2    Comment: (NOTE) INR goal varies based on device and disease states. Performed at [facility], [address] [place], [place] [address]   Urinalysis, Routine w reflex microscopic -Urine, Random     Status: Abnormal   Collection Time: 03/10/24  8:40 AM  Result Value Ref Range   Color, Urine YELLOW (A) YELLOW   APPearance CLEAR (A) CLEAR   Specific Gravity, Urine 1.011 1.005 - 1.030   pH 6.0 5.0 - 8.0   Glucose, UA NEGATIVE NEGATIVE mg/dL   Hgb urine dipstick SMALL (A) NEGATIVE   Bilirubin Urine NEGATIVE NEGATIVE   Ketones, ur NEGATIVE NEGATIVE mg/dL   Protein, ur 30 (A) NEGATIVE mg/dL   Nitrite NEGATIVE  NEGATIVE   Leukocytes,Ua NEGATIVE NEGATIVE   RBC / HPF 0-5 0 - 5 RBC/hpf   WBC, UA 0-5 0 - 5 WBC/hpf   Bacteria, UA NONE SEEN NONE SEEN   Squamous Epithelial / HPF 0 0 - 5 /HPF   Mucus PRESENT     Comment: Performed at [facility], [address], [place], [place] [address]  Troponin I (High Sensitivity)     Status: Abnormal   Collection Time: 03/10/24  9:44 AM  Result Value Ref Range   Troponin I (High Sensitivity) 39 (H) <18 ng/L    Comment: (NOTE) Elevated high sensitivity troponin I (hsTnI) values and significant  changes across serial measurements may suggest ACS but many other  chronic and acute conditions are known to elevate hsTnI results.  Refer to the Links section for chest pain algorithms and additional  guidance. Performed at [facility], [address] [place], [place] [address]       PHQ2/9:    03/18/2024    2:42 PM 03/08/2024    2:56 PM 11/17/2023    2:16 PM 08/18/2023    1:37 PM 05/18/2023    1:09 PM  Depression screen PHQ 2/9  Decreased Interest  0 0 0 0 0  Down, Depressed, Hopeless 0 0 0 0 0  PHQ - 2 Score 0 0 0 0 0  Altered sleeping 0 0 0 0 0  Tired, decreased energy 0 0 0 0 0  Change in appetite 0 0 0 0 0  Feeling bad or failure about yourself  0 0 0 0 0  Trouble concentrating 0 0 0 0 0  Moving slowly or fidgety/restless 0 0 0 0 0  Suicidal thoughts 0 0 0 0 0  PHQ-9 Score 0 0 0 0 0  Difficult doing work/chores Not difficult at all  Not difficult at all Not difficult at all Not difficult at all    phq 9 is negative  Fall Risk:    03/18/2024    2:35 PM 11/17/2023    2:17 PM 08/18/2023    1:36 PM 05/18/2023    1:09 PM 03/18/2023    1:38 PM  Fall Risk   Falls in the past year? 0 0 0 0 0  Number falls in past yr: 0 0 0 0   Injury with Fall? 0 0 0 0   Risk for fall due to : No Fall Risks No Fall Risks No Fall Risks  No Fall Risks  Follow up Falls evaluation completed Falls prevention discussed;Education provided;Falls  evaluation completed Falls prevention discussed;Education provided;Falls evaluation completed  Falls prevention discussed    Assessment & Plan Right lower back pain with sciatica Chronic right lower back pain with sciatica, persisting for three weeks. X-ray showed lumbar spine arthritis. Unable to take NSAIDs due to anticoagulation therapy. - Prescribed tizanidine  for muscle relaxation, up to three times daily, preferably at night or when not driving. - Continue Tylenol  four times daily for pain. - Prescribed Lidoderm  patch for localized pain, 12 hours on/off. Recommend OTC lidocaine  patch if not covered by insurance. - Provided back brace for short-term support, advised against long-term use. - Offered referral to physical therapy if current treatment is inadequate.  Chronic combined systolic and diastolic heart failure with mechanical aortic valve replacement and chronic anticoagulation Chronic heart failure with mechanical aortic valve replacement. Well-controlled with stable vitals. Medication list discrepancies corrected. - Continue metoprolol  XL, spironolactone, torsemide , and warfarin. - Ensure medication list is updated across all providers.  Atrial fibrillation Intermittent atrial fibrillation, currently present but not rapid. Previous episode with elevated troponin indicating cardiac strain. - Continue monitoring heart rate and rhythm. - Maintain anticoagulation with warfarin.  Pulmonary hypertension Pulmonary hypertension monitored by cardiologist. Blood pressure well-controlled. - Continue current management and monitoring by cardiologist.  Chronic kidney disease, stage 3 with secondary hyperparathyroidism Chronic kidney disease stage 3 with stable GFR of 57. Normal calcium , slightly elevated parathyroid hormone in March. - Continue monitoring kidney function and parathyroid hormone levels.  Gout Gout currently asymptomatic, managed with dietary modifications. - Continue  dietary management. - Continue allopurinol  as needed.  Statin-induced myopathy (resolved with switch to Nexlizet ) Statin-induced myopathy resolved with Nexlizet . Good cholesterol control. - Continue Nexlizet  for cholesterol management.

## 2024-03-21 ENCOUNTER — Telehealth: Payer: Self-pay | Admitting: Pharmacy Technician

## 2024-03-21 ENCOUNTER — Other Ambulatory Visit (HOSPITAL_COMMUNITY): Payer: Self-pay

## 2024-03-21 NOTE — Telephone Encounter (Signed)
 Pharmacy Patient Advocate Encounter  Received notification from [facility] that Prior Authorization for Lidocaine  5% patches has been DENIED.  Full denial letter will be uploaded to the media tab. See denial reason below.     PA #/Case ID/Reference #: [id number]

## 2024-03-21 NOTE — Telephone Encounter (Signed)
 Pharmacy Patient Advocate Encounter   Received notification from CoverMyMeds that prior authorization for Lidocaine  5% patches is required/requested.   Insurance verification completed.   The patient is insured through [facility] .   Per test claim: PA required; PA submitted to above mentioned insurance via CoverMyMeds Key/confirmation #/EOC [id number] Status is pending

## 2024-03-24 DIAGNOSIS — Z9889 Other specified postprocedural states: Secondary | ICD-10-CM | POA: Diagnosis not present

## 2024-03-24 DIAGNOSIS — Z952 Presence of prosthetic heart valve: Secondary | ICD-10-CM | POA: Diagnosis not present

## 2024-03-31 DIAGNOSIS — Z9889 Other specified postprocedural states: Secondary | ICD-10-CM | POA: Diagnosis not present

## 2024-03-31 DIAGNOSIS — Z952 Presence of prosthetic heart valve: Secondary | ICD-10-CM | POA: Diagnosis not present

## 2024-04-13 DIAGNOSIS — Z952 Presence of prosthetic heart valve: Secondary | ICD-10-CM | POA: Diagnosis not present

## 2024-04-13 DIAGNOSIS — Z9889 Other specified postprocedural states: Secondary | ICD-10-CM | POA: Diagnosis not present

## 2024-04-24 ENCOUNTER — Other Ambulatory Visit: Payer: Self-pay | Admitting: Family Medicine

## 2024-04-24 DIAGNOSIS — I480 Paroxysmal atrial fibrillation: Secondary | ICD-10-CM

## 2024-04-27 DIAGNOSIS — Z952 Presence of prosthetic heart valve: Secondary | ICD-10-CM | POA: Diagnosis not present

## 2024-04-27 DIAGNOSIS — Z9889 Other specified postprocedural states: Secondary | ICD-10-CM | POA: Diagnosis not present

## 2024-05-06 DIAGNOSIS — Z9889 Other specified postprocedural states: Secondary | ICD-10-CM | POA: Diagnosis not present

## 2024-05-06 DIAGNOSIS — Z952 Presence of prosthetic heart valve: Secondary | ICD-10-CM | POA: Diagnosis not present

## 2024-05-18 ENCOUNTER — Ambulatory Visit: Admitting: Family Medicine

## 2024-05-19 DIAGNOSIS — H02889 Meibomian gland dysfunction of unspecified eye, unspecified eyelid: Secondary | ICD-10-CM | POA: Diagnosis not present

## 2024-05-19 DIAGNOSIS — M3501 Sicca syndrome with keratoconjunctivitis: Secondary | ICD-10-CM | POA: Diagnosis not present

## 2024-05-21 DIAGNOSIS — Z20822 Contact with and (suspected) exposure to covid-19: Secondary | ICD-10-CM | POA: Diagnosis not present

## 2024-05-21 DIAGNOSIS — R070 Pain in throat: Secondary | ICD-10-CM | POA: Diagnosis not present

## 2024-05-21 DIAGNOSIS — J069 Acute upper respiratory infection, unspecified: Secondary | ICD-10-CM | POA: Diagnosis not present

## 2024-05-27 DIAGNOSIS — Z952 Presence of prosthetic heart valve: Secondary | ICD-10-CM | POA: Diagnosis not present

## 2024-05-27 DIAGNOSIS — Z9889 Other specified postprocedural states: Secondary | ICD-10-CM | POA: Diagnosis not present

## 2024-06-04 ENCOUNTER — Other Ambulatory Visit: Payer: Self-pay | Admitting: Family Medicine

## 2024-06-04 DIAGNOSIS — I5042 Chronic combined systolic (congestive) and diastolic (congestive) heart failure: Secondary | ICD-10-CM

## 2024-06-16 DIAGNOSIS — I10 Essential (primary) hypertension: Secondary | ICD-10-CM | POA: Diagnosis not present

## 2024-06-16 DIAGNOSIS — R0602 Shortness of breath: Secondary | ICD-10-CM | POA: Diagnosis not present

## 2024-06-16 DIAGNOSIS — Z7901 Long term (current) use of anticoagulants: Secondary | ICD-10-CM | POA: Diagnosis not present

## 2024-06-16 DIAGNOSIS — I5022 Chronic systolic (congestive) heart failure: Secondary | ICD-10-CM | POA: Diagnosis not present

## 2024-06-16 DIAGNOSIS — J42 Unspecified chronic bronchitis: Secondary | ICD-10-CM | POA: Diagnosis not present

## 2024-06-16 DIAGNOSIS — Q2381 Bicuspid aortic valve: Secondary | ICD-10-CM | POA: Diagnosis not present

## 2024-06-16 DIAGNOSIS — I4891 Unspecified atrial fibrillation: Secondary | ICD-10-CM | POA: Diagnosis not present

## 2024-06-16 DIAGNOSIS — Z952 Presence of prosthetic heart valve: Secondary | ICD-10-CM | POA: Diagnosis not present

## 2024-06-16 DIAGNOSIS — Z9889 Other specified postprocedural states: Secondary | ICD-10-CM | POA: Diagnosis not present

## 2024-06-20 ENCOUNTER — Other Ambulatory Visit: Payer: Self-pay | Admitting: Family Medicine

## 2024-06-20 DIAGNOSIS — I480 Paroxysmal atrial fibrillation: Secondary | ICD-10-CM

## 2024-07-07 ENCOUNTER — Other Ambulatory Visit: Payer: Self-pay | Admitting: Surgery

## 2024-07-07 DIAGNOSIS — Z1231 Encounter for screening mammogram for malignant neoplasm of breast: Secondary | ICD-10-CM

## 2024-07-19 ENCOUNTER — Ambulatory Visit (INDEPENDENT_AMBULATORY_CARE_PROVIDER_SITE_OTHER): Admitting: Family Medicine

## 2024-07-19 ENCOUNTER — Encounter: Payer: Self-pay | Admitting: Family Medicine

## 2024-07-19 VITALS — BP 132/82 | HR 66 | Resp 16 | Ht 64.0 in | Wt 177.9 lb

## 2024-07-19 DIAGNOSIS — I10 Essential (primary) hypertension: Secondary | ICD-10-CM

## 2024-07-19 DIAGNOSIS — N1831 Chronic kidney disease, stage 3a: Secondary | ICD-10-CM | POA: Diagnosis not present

## 2024-07-19 DIAGNOSIS — Z23 Encounter for immunization: Secondary | ICD-10-CM | POA: Diagnosis not present

## 2024-07-19 DIAGNOSIS — I700 Atherosclerosis of aorta: Secondary | ICD-10-CM | POA: Diagnosis not present

## 2024-07-19 DIAGNOSIS — I4891 Unspecified atrial fibrillation: Secondary | ICD-10-CM

## 2024-07-19 DIAGNOSIS — J410 Simple chronic bronchitis: Secondary | ICD-10-CM | POA: Diagnosis not present

## 2024-07-19 DIAGNOSIS — I5042 Chronic combined systolic (congestive) and diastolic (congestive) heart failure: Secondary | ICD-10-CM

## 2024-07-19 DIAGNOSIS — M109 Gout, unspecified: Secondary | ICD-10-CM | POA: Diagnosis not present

## 2024-07-19 DIAGNOSIS — Z952 Presence of prosthetic heart valve: Secondary | ICD-10-CM

## 2024-07-19 DIAGNOSIS — I480 Paroxysmal atrial fibrillation: Secondary | ICD-10-CM | POA: Diagnosis not present

## 2024-07-19 DIAGNOSIS — G720 Drug-induced myopathy: Secondary | ICD-10-CM

## 2024-07-19 DIAGNOSIS — I272 Pulmonary hypertension, unspecified: Secondary | ICD-10-CM | POA: Diagnosis not present

## 2024-07-19 MED ORDER — NEXLIZET 180-10 MG PO TABS: 1.0000 | ORAL_TABLET | Freq: Every day | ORAL | 1 refills | Status: AC

## 2024-07-19 NOTE — Progress Notes (Signed)
 Name: [name]   MRN: [id number]    DOB: 1941/02/06   Date:07/19/2024       Progress Note  Subjective  Chief Complaint  Chief Complaint  Patient presents with   Medical Management of Chronic Issues   Discussed the use of AI scribe software for clinical note transcription with the patient, who gave verbal consent to proceed.  History of Present Illness [name] is an 83 year old female with chronic heart failure and atrial fibrillation who presents for a routine follow-up.  She has a history of atrial fibrillation, congestive heart failure, and pulmonary hypertension. She experiences shortness of breath, which she attributes to her weight, but notes that she can walk normally at work without getting out of breath unless she is rushing. She uses two pillows at night and does not experience orthopnea. Her medications include Coumadin , metoprolol , torsemide , spironolactone, and isosorbide. No chest pain and she is unsure why she was prescribed isosorbide. She also takes Nexlizet  for cholesterol management, as she cannot tolerate statins due to muscle pain.  She has chronic kidney disease stage 3A with a GFR between 51 and 53. She takes spironolactone and torsemide  for fluid management and reports no muscle spasms, as she supplements with potassium and magnesium. She also takes vitamin D3 at 2000 units daily.  She has a history of gout, which is controlled with allopurinol , and she reports no recent gout attacks. She also has simple chronic bronchitis and uses Spiriva  occasionally. She coughs occasionally due to drainage but manages it with Mucinex .  Her back pain has improved since she retired and reduced her work hours to two days a week. 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 She had an x-ray in the past which showed DDD. SABRA She is not currently using tizanidine  or Lidoderm  for pain management.     Patient Active Problem List   Diagnosis Date Noted   Degeneration of intervertebral disc of lumbar region without discogenic back pain or lower extremity pain 03/18/2024   Pulmonary hypertension (HCC) 03/18/2024   Chronic combined systolic and diastolic CHF, NYHA class 1 (HCC) 03/18/2024   Atherosclerosis of aorta 11/17/2023   Chronic bronchitis (HCC) 08/18/2023   Chronic kidney disease, stage 3a (HCC) 03/18/2023   Major depression in remission 11/05/2021   Hyperparathyroidism 11/05/2021   Tibialis posterior tendinitis 03/08/2020   On Coumadin  for atrial fibrillation (HCC) 06/30/2018   Chronic anticoagulation 08/27/2017   History of Paget's disease of breast 08/27/2017   Presence of prosthetic heart valve 08/04/2017   Controlled gout 06/06/2015   Synovial cyst of popliteal space 02/09/2015   Benign hypertension 02/09/2015   Blood type A+ 02/09/2015   Arterial branch occlusion of retina 02/09/2015   Chronic constipation 02/09/2015   Insomnia, persistent 02/09/2015   Dyslipidemia 02/09/2015   Hearing loss 02/09/2015   History of open heart surgery 02/09/2015   Chronic hoarseness 02/09/2015   Cardiomegaly 02/09/2015   Dysmetabolic syndrome 02/09/2015   Migraine with aura and without status migrainosus, not intractable 02/09/2015   Allergic rhinitis, seasonal 02/09/2015   Vitamin D  deficiency 02/09/2015   History of mitral valve repair 10/01/2012   History of aortic valve replacement 10/01/2012    Past Surgical History:  Procedure Laterality Date   ABDOMINAL HYSTERECTOMY  1996   arm surgery  2019   BREAST BIOPSY Right 1998   neg   BREAST BIOPSY Right 2007   neg  BREAST BIOPSY Right 1992   positive   BREAST EXCISIONAL BIOPSY Left 2000   neg   BREAST LUMPECTOMY Right 1992   BREAST LUMPECTOMY Left 08/17/2020   Procedure: BREAST LUMPECTOMY;  Surgeon: Dessa Reyes ORN, MD;  Location: [facility];  Service: General;  Laterality: Left;   CARDIAC VALVE REPLACEMENT  2014    CATARACT EXTRACTION W/PHACO Left 03/30/2018   Procedure: CATARACT EXTRACTION PHACO AND INTRAOCULAR LENS PLACEMENT (IOC);  Surgeon: Jaye Fallow, MD;  Location: [facility];  Service: Ophthalmology;  Laterality: Left;  US  00:31.9 AP% 12.1 CDE 3.87 Fluid Pack lot # [id number]   CESAREAN SECTION     FRACTURE SURGERY Left    ARM/ LEG   LEG SURGERY      Family History  Problem Relation Age of Onset   Cancer Mother    Diabetes Mother    Hypertension Mother    Breast cancer Mother 40   Hypertension Father    Heart failure Father    Cancer Brother        bladder   Cancer Sister        breast   Breast cancer Sister 21   Hypertension Son    Hypertension Daughter    Breast cancer Sister 72    Social History   Tobacco Use   Smoking status: Former    Current packs/day: 0.00    Types: Cigarettes    Quit date: 09/09/2007    Years since quitting: 16.8   Smokeless tobacco: Never  Substance Use Topics   Alcohol use: No    Alcohol/week: 0.0 standard drinks of alcohol     Current Outpatient Medications:    acetaminophen  (TYLENOL ) 500 MG tablet, Take 1,000 mg by mouth every 6 (six) hours as needed for mild pain or moderate pain. , Disp: , Rfl:    albuterol  (VENTOLIN  HFA) 108 (90 Base) MCG/ACT inhaler, TAKE 2 PUFFS BY MOUTH EVERY 6 HOURS AS NEEDED FOR WHEEZE OR SHORTNESS OF BREATH, Disp: 18 each, Rfl: 1   allopurinol  (ZYLOPRIM ) 300 MG tablet, Take 1 tablet (300 mg total) by mouth daily., Disp: 90 tablet, Rfl: 1   cetirizine  (ZYRTEC ) 10 MG tablet, Take 0.5-1 tablets (5-10 mg total) by mouth at bedtime., Disp: 30 tablet, Rfl: 11   Elastic Bandages & Supports (LUMBAR BACK BRACE/SUPPORT PAD) MISC, 1 each by Does not apply route daily as needed., Disp: 1 each, Rfl: 0   Ergocalciferol  (VITAMIN D2) 50 MCG (2000 UT) TABS, Take 2,000 Units by mouth daily., Disp: , Rfl:    fluticasone  (FLONASE ) 50 MCG/ACT nasal spray, Place 2 sprays into both nostrils daily., Disp: 16 g, Rfl: 6   hydrALAZINE  (APRESOLINE) 25 MG tablet, Take 25 mg by mouth 2 (two) times daily., Disp: , Rfl:    isosorbide mononitrate (IMDUR) 30 MG 24 hr tablet, Take 30 mg by mouth daily., Disp: , Rfl:    meclizine  (ANTIVERT ) 25 MG tablet, Take 1 tablet (25 mg total) by mouth 3 (three) times daily as needed for dizziness., Disp: 30 tablet, Rfl: 0   metoprolol  succinate (TOPROL -XL) 25 MG 24 hr tablet, Take 25 mg by mouth daily., Disp: , Rfl:    MIEBO 1.338 GM/ML SOLN, Apply 1 drop to eye 4 (four) times daily., Disp: , Rfl:    Multiple Vitamins-Minerals (VITAMIN D3 COMPLETE PO), Take 1 tablet by mouth every other day., Disp: , Rfl:    potassium chloride  SA (KLOR-CON  M) 20 MEQ tablet, TAKE 1 TABLET BY MOUTH TWICE A DAY, Disp: 180  tablet, Rfl: 1   sodium chloride  (OCEAN) 0.65 % SOLN nasal spray, Place 1 spray into both nostrils as needed for congestion., Disp: , Rfl:    spironolactone (ALDACTONE) 25 MG tablet, Take 12.5 mg by mouth daily., Disp: , Rfl:    tiotropium (SPIRIVA  HANDIHALER) 18 MCG inhalation capsule, INHALE 1 CASPULE VIA HANDIHALER ONCE A DAY AT THE SAME TIME EVERY DAY, Disp: 90 capsule, Rfl: 1   torsemide  (DEMADEX ) 20 MG tablet, Take 1 tablet (20 mg total) by mouth daily., Disp: 90 tablet, Rfl: 1   traZODone  (DESYREL ) 50 MG tablet, Take 1 tablet (50 mg total) by mouth at bedtime as needed., Disp: 90 tablet, Rfl: 1   warfarin (COUMADIN ) 5 MG tablet, Take 5 mg by mouth daily at 12 noon., Disp: , Rfl:    Bempedoic Acid-Ezetimibe  (NEXLIZET ) 180-10 MG TABS, Take 1 tablet by mouth daily at 12 noon., Disp: 90 tablet, Rfl: 1  Allergies  Allergen Reactions   Codeine Nausea And Vomiting and Nausea Only   Ace Inhibitors Cough and Other (See Comments)   Amoxicillin-Pot Clavulanate Nausea And Vomiting   Penicillins Nausea And Vomiting and Other (See Comments)    Has patient had a PCN reaction causing immediate rash, facial/tongue/throat swelling, SOB or lightheadedness with hypotension: No Has patient had a PCN reaction  causing severe rash involving mucus membranes or skin necrosis: No Has patient had a PCN reaction that required hospitalization No Has patient had a PCN reaction occurring within the last 10 years: No If all of the above answers are NO, then may proceed with Cephalosporin use.   Rosuvastatin Other (See Comments)    Reaction:  Joint pain    Sulfa  Antibiotics Other (See Comments)   Tetanus-Diphtheria Toxoids Td Swelling    I personally reviewed active problem list, medication list, allergies, family history with the patient/caregiver today.   ROS  Ten systems reviewed and is negative except as mentioned in HPI    Objective Physical Exam VITALS: BP- 132/82 MEASUREMENTS: Weight- same. CONSTITUTIONAL: Patient appears well-developed and well-nourished. No distress. HEENT: Head atraumatic, normocephalic, neck supple. CARDIOVASCULAR: Irregular heartbeat with aortic valve murmur present. No atrial fibrillation. No BLE edema. PULMONARY: Effort normal and breath sounds normal. No respiratory distress. ABDOMINAL: There is no tenderness or distention. MUSCULOSKELETAL: Normal gait. Without gross motor or sensory deficit. PSYCHIATRIC: Patient has a normal mood and affect. Behavior is normal. Judgment and thought content normal.  Vitals:   07/19/24 1421  BP: 132/82  Pulse: 66  Resp: 16  SpO2: 99%  Weight: 177 lb 14.4 oz (80.7 kg)  Height: 5' 4 (1.626 m)    Body mass index is 30.54 kg/m.   PHQ2/9:    07/19/2024    2:14 PM 03/18/2024    2:42 PM 03/08/2024    2:56 PM 11/17/2023    2:16 PM 08/18/2023    1:37 PM  Depression screen PHQ 2/9  Decreased Interest 0 0 0 0 0  Down, Depressed, Hopeless 0 0 0 0 0  PHQ - 2 Score 0 0 0 0 0  Altered sleeping 0 0 0 0 0  Tired, decreased energy 0 0 0 0 0  Change in appetite 0 0 0 0 0  Feeling bad or failure about yourself  0 0 0 0 0  Trouble concentrating 0 0 0 0 0  Moving slowly or fidgety/restless 0 0 0 0 0  Suicidal thoughts 0 0 0 0 0   PHQ-9 Score 0 0  0  0  0   Difficult doing work/chores Not difficult at all Not difficult at all  Not difficult at all Not difficult at all     Data saved with a previous flowsheet row definition    phq 9 is negative  Fall Risk:    07/19/2024    2:14 PM 03/18/2024    2:35 PM 11/17/2023    2:17 PM 08/18/2023    1:36 PM 05/18/2023    1:09 PM  Fall Risk   Falls in the past year? 0 0 0 0 0  Number falls in past yr: 0 0 0 0 0  Injury with Fall? 0 0 0 0 0  Risk for fall due to : No Fall Risks No Fall Risks No Fall Risks No Fall Risks   Follow up Falls evaluation completed Falls evaluation completed Falls prevention discussed;Education provided;Falls evaluation completed Falls prevention discussed;Education provided;Falls evaluation completed     Assessment & Plan Chronic combined systolic and diastolic heart failure with atrial fibrillation and mechanical aortic valve replacement Chronic heart failure with atrial fibrillation and mechanical aortic valve replacement. Shortness of breath is stable. Blood pressure slightly elevated, likely due to missed metoprolol  dose. Heart rate irregular but not in atrial fibrillation. - Continue Coumadin  for anticoagulation. - Continue metoprolol  for heart rate control. - Continue spironolactone for heart failure and blood pressure control. - Continue torsemide  for fluid management. - Monitor blood pressure regularly. - Follow up with cardiologist as needed.  Pulmonary hypertension - Continue current management for heart failure.  Chronic kidney disease stage 3a with secondary hyperparathyroidism Chronic kidney disease stage 3a with GFR between 51 and 53. Secondary hyperparathyroidism present. - Continue monitoring kidney function. - Continue current medications including potassium and magnesium supplementation.  Dyslipidemia/Atherosclerosis of Aorta - continue Nexlizet    Essential hypertension Blood pressure slightly elevated, likely due to  missed metoprolol  dose. Home readings generally well-controlled. - Continue metoprolol  for blood pressure control. - Monitor blood pressure regularly.  Gout Well-controlled with allopurinol . No recent gout attacks. - Continue allopurinol  for gout management.  Simple chronic bronchitis Occasional cough and drainage. No daily use of Spiriva . - Continue Spiriva  as needed for bronchitis symptoms. - Use Mucinex  for drainage as needed.  Drug-induced myopathy (statin-induced) Statin-induced myopathy managed with Nexllizet. No muscle pain with current medication. - Continue Nexletol for cholesterol management.  General Health Maintenance Routine health maintenance discussed. No recent Medicare wellness visit.

## 2024-07-20 DIAGNOSIS — Z9889 Other specified postprocedural states: Secondary | ICD-10-CM | POA: Diagnosis not present

## 2024-07-20 DIAGNOSIS — Z952 Presence of prosthetic heart valve: Secondary | ICD-10-CM | POA: Diagnosis not present

## 2024-08-08 DIAGNOSIS — Z952 Presence of prosthetic heart valve: Secondary | ICD-10-CM | POA: Diagnosis not present

## 2024-08-08 DIAGNOSIS — Z9889 Other specified postprocedural states: Secondary | ICD-10-CM | POA: Diagnosis not present

## 2024-08-25 ENCOUNTER — Encounter

## 2024-10-11 ENCOUNTER — Inpatient Hospital Stay: Admission: RE | Admit: 2024-10-11 | Source: Ambulatory Visit

## 2025-01-17 ENCOUNTER — Ambulatory Visit: Admitting: Family Medicine
